# Patient Record
Sex: Male | Born: 1935 | Race: White | Hispanic: No | Marital: Married | Smoking: Former smoker
Health system: Southern US, Community
[De-identification: ages and names within clinical notes are randomized; demographics above are authoritative.]

## PROBLEM LIST (undated history)

## (undated) DIAGNOSIS — J449 Chronic obstructive pulmonary disease, unspecified: Secondary | ICD-10-CM

## (undated) DIAGNOSIS — I340 Nonrheumatic mitral (valve) insufficiency: Secondary | ICD-10-CM

## (undated) DIAGNOSIS — I4819 Other persistent atrial fibrillation: Secondary | ICD-10-CM

## (undated) DIAGNOSIS — M1990 Unspecified osteoarthritis, unspecified site: Secondary | ICD-10-CM

## (undated) DIAGNOSIS — I350 Nonrheumatic aortic (valve) stenosis: Secondary | ICD-10-CM

## (undated) DIAGNOSIS — I509 Heart failure, unspecified: Secondary | ICD-10-CM

## (undated) DIAGNOSIS — I4510 Unspecified right bundle-branch block: Secondary | ICD-10-CM

## (undated) HISTORY — DX: Nonrheumatic aortic (valve) stenosis: I35.0

## (undated) HISTORY — DX: Other persistent atrial fibrillation: I48.19

## (undated) HISTORY — PX: CARDIAC CATHETERIZATION: SHX172

## (undated) HISTORY — DX: Nonrheumatic mitral (valve) insufficiency: I34.0

## (undated) HISTORY — PX: TOTAL KNEE ARTHROPLASTY: SHX125

## (undated) HISTORY — PX: CLAVICLE SURGERY: SHX598

## (undated) HISTORY — PX: APPENDECTOMY: SHX54

## (undated) HISTORY — DX: Unspecified right bundle-branch block: I45.10

---

## 2020-07-15 ENCOUNTER — Inpatient Hospital Stay (HOSPITAL_COMMUNITY)
Admission: EM | Admit: 2020-07-15 | Discharge: 2020-07-19 | DRG: 291 | Disposition: A | Discharge status: Home / Self Care | Payer: Medicare Other | Attending: Internal Medicine | Admitting: Internal Medicine

## 2020-07-15 ENCOUNTER — Emergency Department (HOSPITAL_COMMUNITY): Payer: Medicare Other

## 2020-07-15 ENCOUNTER — Other Ambulatory Visit: Payer: Self-pay

## 2020-07-15 ENCOUNTER — Encounter (HOSPITAL_COMMUNITY): Payer: Self-pay

## 2020-07-15 DIAGNOSIS — I083 Combined rheumatic disorders of mitral, aortic and tricuspid valves: Secondary | ICD-10-CM | POA: Diagnosis present

## 2020-07-15 DIAGNOSIS — N400 Enlarged prostate without lower urinary tract symptoms: Secondary | ICD-10-CM | POA: Diagnosis present

## 2020-07-15 DIAGNOSIS — D649 Anemia, unspecified: Secondary | ICD-10-CM | POA: Diagnosis present

## 2020-07-15 DIAGNOSIS — E876 Hypokalemia: Secondary | ICD-10-CM | POA: Diagnosis present

## 2020-07-15 DIAGNOSIS — T502X5A Adverse effect of carbonic-anhydrase inhibitors, benzothiadiazides and other diuretics, initial encounter: Secondary | ICD-10-CM | POA: Diagnosis present

## 2020-07-15 DIAGNOSIS — J441 Chronic obstructive pulmonary disease with (acute) exacerbation: Secondary | ICD-10-CM | POA: Diagnosis present

## 2020-07-15 DIAGNOSIS — D72829 Elevated white blood cell count, unspecified: Secondary | ICD-10-CM | POA: Diagnosis present

## 2020-07-15 DIAGNOSIS — G8929 Other chronic pain: Secondary | ICD-10-CM | POA: Diagnosis present

## 2020-07-15 DIAGNOSIS — I5033 Acute on chronic diastolic (congestive) heart failure: Secondary | ICD-10-CM | POA: Diagnosis present

## 2020-07-15 DIAGNOSIS — Z87891 Personal history of nicotine dependence: Secondary | ICD-10-CM

## 2020-07-15 DIAGNOSIS — Z66 Do not resuscitate: Secondary | ICD-10-CM | POA: Diagnosis present

## 2020-07-15 DIAGNOSIS — I480 Paroxysmal atrial fibrillation: Secondary | ICD-10-CM | POA: Diagnosis not present

## 2020-07-15 DIAGNOSIS — Z7901 Long term (current) use of anticoagulants: Secondary | ICD-10-CM

## 2020-07-15 DIAGNOSIS — I5032 Chronic diastolic (congestive) heart failure: Secondary | ICD-10-CM | POA: Diagnosis present

## 2020-07-15 DIAGNOSIS — B9789 Other viral agents as the cause of diseases classified elsewhere: Secondary | ICD-10-CM | POA: Diagnosis present

## 2020-07-15 DIAGNOSIS — J9601 Acute respiratory failure with hypoxia: Secondary | ICD-10-CM | POA: Diagnosis present

## 2020-07-15 DIAGNOSIS — Y929 Unspecified place or not applicable: Secondary | ICD-10-CM

## 2020-07-15 DIAGNOSIS — Z20822 Contact with and (suspected) exposure to covid-19: Secondary | ICD-10-CM | POA: Diagnosis present

## 2020-07-15 DIAGNOSIS — I482 Chronic atrial fibrillation, unspecified: Secondary | ICD-10-CM | POA: Diagnosis present

## 2020-07-15 DIAGNOSIS — I4821 Permanent atrial fibrillation: Secondary | ICD-10-CM | POA: Diagnosis present

## 2020-07-15 DIAGNOSIS — I350 Nonrheumatic aortic (valve) stenosis: Secondary | ICD-10-CM | POA: Diagnosis not present

## 2020-07-15 DIAGNOSIS — Z96653 Presence of artificial knee joint, bilateral: Secondary | ICD-10-CM | POA: Diagnosis present

## 2020-07-15 DIAGNOSIS — I110 Hypertensive heart disease with heart failure: Secondary | ICD-10-CM | POA: Diagnosis present

## 2020-07-15 DIAGNOSIS — I272 Pulmonary hypertension, unspecified: Secondary | ICD-10-CM | POA: Diagnosis present

## 2020-07-15 DIAGNOSIS — I509 Heart failure, unspecified: Secondary | ICD-10-CM

## 2020-07-15 DIAGNOSIS — I959 Hypotension, unspecified: Secondary | ICD-10-CM | POA: Diagnosis not present

## 2020-07-15 HISTORY — DX: Heart failure, unspecified: I50.9

## 2020-07-15 HISTORY — DX: Unspecified osteoarthritis, unspecified site: M19.90

## 2020-07-15 HISTORY — DX: Chronic obstructive pulmonary disease, unspecified: J44.9

## 2020-07-15 LAB — URINALYSIS, ROUTINE W REFLEX MICROSCOPIC
Bacteria, UA: NONE SEEN
Bilirubin Urine: NEGATIVE
Glucose, UA: NEGATIVE mg/dL
Ketones, ur: NEGATIVE mg/dL
Leukocytes,Ua: NEGATIVE
Nitrite: NEGATIVE
Protein, ur: NEGATIVE mg/dL
Specific Gravity, Urine: 1.005 (ref 1.005–1.030)
pH: 6 (ref 5.0–8.0)

## 2020-07-15 LAB — RESPIRATORY PANEL BY PCR

## 2020-07-15 LAB — COMPREHENSIVE METABOLIC PANEL
ALT: 15 U/L (ref 0–44)
AST: 20 U/L (ref 15–41)
Albumin: 3.6 g/dL (ref 3.5–5.0)
Alkaline Phosphatase: 66 U/L (ref 38–126)
Anion gap: 14 (ref 5–15)
BUN: 23 mg/dL (ref 8–23)
CO2: 30 mmol/L (ref 22–32)
Calcium: 8.8 mg/dL — ABNORMAL LOW (ref 8.9–10.3)
Chloride: 93 mmol/L — ABNORMAL LOW (ref 98–111)
Creatinine, Ser: 0.98 mg/dL (ref 0.61–1.24)
GFR, Estimated: >60 mL/min (ref >60)
Glucose, Bld: 116 mg/dL — ABNORMAL HIGH (ref 70–99)
Potassium: 3.1 mmol/L — ABNORMAL LOW (ref 3.5–5.1)
Sodium: 137 mmol/L (ref 135–145)
Total Bilirubin: 1.8 mg/dL — ABNORMAL HIGH (ref 0.3–1.2)
Total Protein: 6.9 g/dL (ref 6.5–8.1)

## 2020-07-15 LAB — CBC WITH DIFFERENTIAL/PLATELET
Abs Immature Granulocytes: 0.05 10*3/uL (ref 0.00–0.07)
Basophils Absolute: 0.1 10*3/uL (ref 0.0–0.1)
Basophils Relative: 1 %
Eosinophils Absolute: 0.1 10*3/uL (ref 0.0–0.5)
Eosinophils Relative: 1 %
HCT: 40.3 % (ref 39.0–52.0)
Hemoglobin: 13.1 g/dL (ref 13.0–17.0)
Immature Granulocytes: 1 %
Lymphocytes Relative: 6 %
Lymphs Abs: 0.7 10*3/uL (ref 0.7–4.0)
MCH: 32.3 pg (ref 26.0–34.0)
MCHC: 32.5 g/dL (ref 30.0–36.0)
MCV: 99.3 fL (ref 80.0–100.0)
Monocytes Absolute: 0.8 10*3/uL (ref 0.1–1.0)
Monocytes Relative: 7 %
Neutro Abs: 9.4 10*3/uL — ABNORMAL HIGH (ref 1.7–7.7)
Neutrophils Relative %: 84 %
Platelets: 208 10*3/uL (ref 150–400)
RBC: 4.06 MIL/uL — ABNORMAL LOW (ref 4.22–5.81)
RDW: 15.8 % — ABNORMAL HIGH (ref 11.5–15.5)
WBC: 11 10*3/uL — ABNORMAL HIGH (ref 4.0–10.5)
nRBC: 0 % (ref 0.0–0.2)

## 2020-07-15 LAB — PROTIME-INR
INR: 2.5 — ABNORMAL HIGH (ref 0.8–1.2)
Prothrombin Time: 26.4 seconds — ABNORMAL HIGH (ref 11.4–15.2)

## 2020-07-15 LAB — STREP PNEUMONIAE URINARY ANTIGEN: Strep Pneumo Urinary Antigen: NEGATIVE

## 2020-07-15 LAB — RESP PANEL BY RT-PCR (FLU A&B, COVID) ARPGX2
Influenza A by PCR: NEGATIVE
Influenza B by PCR: NEGATIVE
SARS Coronavirus 2 by RT PCR: NEGATIVE

## 2020-07-15 LAB — TSH: TSH: 0.398 u[IU]/mL (ref 0.350–4.500)

## 2020-07-15 LAB — BRAIN NATRIURETIC PEPTIDE: B Natriuretic Peptide: 487.9 pg/mL — ABNORMAL HIGH (ref 0.0–100.0)

## 2020-07-15 MED ORDER — WARFARIN SODIUM 3 MG PO TABS
6.0000 mg | ORAL_TABLET | Freq: Once | ORAL | Status: AC
  Administered 2020-07-15: 6 mg via ORAL
  Filled 2020-07-15 (×2): qty 2

## 2020-07-15 MED ORDER — SODIUM CHLORIDE 0.9 % IV SOLN
INTRAVENOUS | Status: DC | PRN
  Administered 2020-07-15: 1000 mL via INTRAVENOUS

## 2020-07-15 MED ORDER — BISACODYL 5 MG PO TBEC: 5.0000 mg | DELAYED_RELEASE_TABLET | Freq: Every day | ORAL | Status: DC | PRN

## 2020-07-15 MED ORDER — HYDRALAZINE HCL 20 MG/ML IJ SOLN: 5.0000 mg | INTRAMUSCULAR | Status: DC | PRN

## 2020-07-15 MED ORDER — FUROSEMIDE 10 MG/ML IJ SOLN
40.0000 mg | Freq: Two times a day (BID) | INTRAMUSCULAR | Status: DC
  Administered 2020-07-15: 40 mg via INTRAVENOUS
  Filled 2020-07-15: qty 4

## 2020-07-15 MED ORDER — FUROSEMIDE 10 MG/ML IJ SOLN
40.0000 mg | Freq: Once | INTRAMUSCULAR | Status: AC
  Administered 2020-07-15: 40 mg via INTRAVENOUS
  Filled 2020-07-15: qty 4

## 2020-07-15 MED ORDER — MONTELUKAST SODIUM 10 MG PO TABS
10.0000 mg | ORAL_TABLET | Freq: Every day | ORAL | Status: DC
  Administered 2020-07-16 – 2020-07-19 (×4): 10 mg via ORAL
  Filled 2020-07-15 (×5): qty 1

## 2020-07-15 MED ORDER — HYDROCODONE-ACETAMINOPHEN 7.5-325 MG PO TABS: 1.0000 | ORAL_TABLET | Freq: Three times a day (TID) | ORAL | Status: DC | PRN

## 2020-07-15 MED ORDER — TIZANIDINE HCL 4 MG PO TABS: 2.0000 mg | ORAL_TABLET | Freq: Every evening | ORAL | Status: DC | PRN

## 2020-07-15 MED ORDER — DUTASTERIDE 0.5 MG PO CAPS
0.5000 mg | ORAL_CAPSULE | Freq: Every day | ORAL | Status: DC
  Administered 2020-07-16 – 2020-07-19 (×4): 0.5 mg via ORAL
  Filled 2020-07-15 (×4): qty 1

## 2020-07-15 MED ORDER — TAMSULOSIN HCL 0.4 MG PO CAPS
0.8000 mg | ORAL_CAPSULE | Freq: Every day | ORAL | Status: DC
  Administered 2020-07-15 – 2020-07-18 (×4): 0.8 mg via ORAL
  Filled 2020-07-15 (×4): qty 2

## 2020-07-15 MED ORDER — POLYVINYL ALCOHOL 1.4 % OP SOLN
1.0000 [drp] | Freq: Every day | OPHTHALMIC | Status: DC | PRN
  Filled 2020-07-15: qty 15

## 2020-07-15 MED ORDER — PROPYLENE GLYCOL 0.6 % OP SOLN: 1.0000 [drp] | Freq: Every day | OPHTHALMIC | Status: DC | PRN

## 2020-07-15 MED ORDER — LEVALBUTEROL HCL 0.63 MG/3ML IN NEBU: 0.6300 mg | INHALATION_SOLUTION | Freq: Four times a day (QID) | RESPIRATORY_TRACT | Status: DC | PRN

## 2020-07-15 MED ORDER — SODIUM CHLORIDE 0.9 % IV SOLN
500.0000 mg | INTRAVENOUS | Status: AC
  Administered 2020-07-15: 500 mg via INTRAVENOUS
  Filled 2020-07-15 (×2): qty 500

## 2020-07-15 MED ORDER — METHYLPREDNISOLONE SODIUM SUCC 125 MG IJ SOLR
60.0000 mg | Freq: Two times a day (BID) | INTRAMUSCULAR | Status: AC
  Administered 2020-07-15 – 2020-07-16 (×2): 60 mg via INTRAVENOUS
  Filled 2020-07-15 (×2): qty 2

## 2020-07-15 MED ORDER — POLYETHYLENE GLYCOL 3350 17 G PO PACK: 17.0000 g | PACK | Freq: Every day | ORAL | Status: DC | PRN

## 2020-07-15 MED ORDER — WARFARIN - PHARMACIST DOSING INPATIENT: Freq: Every day | Status: DC

## 2020-07-15 MED ORDER — AZITHROMYCIN 500 MG PO TABS
500.0000 mg | ORAL_TABLET | Freq: Every day | ORAL | Status: AC
  Administered 2020-07-16 – 2020-07-19 (×4): 500 mg via ORAL
  Filled 2020-07-15 (×6): qty 1

## 2020-07-15 MED ORDER — DOCUSATE SODIUM 100 MG PO CAPS
100.0000 mg | ORAL_CAPSULE | Freq: Two times a day (BID) | ORAL | Status: DC
  Administered 2020-07-15 – 2020-07-19 (×8): 100 mg via ORAL
  Filled 2020-07-15 (×8): qty 1

## 2020-07-15 MED ORDER — METOPROLOL SUCCINATE ER 50 MG PO TB24
50.0000 mg | ORAL_TABLET | Freq: Every day | ORAL | Status: DC
  Filled 2020-07-15: qty 1

## 2020-07-15 MED ORDER — ACETAMINOPHEN 650 MG RE SUPP: 650.0000 mg | Freq: Four times a day (QID) | RECTAL | Status: DC | PRN

## 2020-07-15 MED ORDER — ONDANSETRON HCL 4 MG PO TABS: 4.0000 mg | ORAL_TABLET | Freq: Four times a day (QID) | ORAL | Status: DC | PRN

## 2020-07-15 MED ORDER — ALPRAZOLAM 0.25 MG PO TABS: 0.1250 mg | ORAL_TABLET | Freq: Every evening | ORAL | Status: DC | PRN

## 2020-07-15 MED ORDER — IPRATROPIUM BROMIDE 0.02 % IN SOLN
0.5000 mg | Freq: Four times a day (QID) | RESPIRATORY_TRACT | Status: DC
  Administered 2020-07-16: 0.5 mg via RESPIRATORY_TRACT
  Filled 2020-07-15 (×2): qty 2.5

## 2020-07-15 MED ORDER — IRBESARTAN 75 MG PO TABS
75.0000 mg | ORAL_TABLET | Freq: Every day | ORAL | Status: DC
  Filled 2020-07-15 (×2): qty 1

## 2020-07-15 MED ORDER — ONDANSETRON HCL 4 MG/2ML IJ SOLN: 4.0000 mg | Freq: Four times a day (QID) | INTRAMUSCULAR | Status: DC | PRN

## 2020-07-15 MED ORDER — ACETAMINOPHEN 325 MG PO TABS
650.0000 mg | ORAL_TABLET | Freq: Four times a day (QID) | ORAL | Status: DC | PRN
  Administered 2020-07-17: 650 mg via ORAL
  Filled 2020-07-15: qty 2

## 2020-07-15 MED ORDER — MORPHINE SULFATE (PF) 2 MG/ML IV SOLN: 2.0000 mg | INTRAVENOUS | Status: DC | PRN

## 2020-07-15 MED ORDER — PREDNISONE 20 MG PO TABS
40.0000 mg | ORAL_TABLET | Freq: Every day | ORAL | Status: AC
  Administered 2020-07-16 – 2020-07-19 (×4): 40 mg via ORAL
  Filled 2020-07-15 (×5): qty 2

## 2020-07-15 NOTE — ED Provider Notes (Signed)
[facility] EMERGENCY DEPARTMENT Provider Note   CSN: [id number] Arrival date & time: 07/15/20  1231     History Chief Complaint  Patient presents with  . Congested Cough w/ decreased O2 sats    No shob, Hx of COPD and CHF    Randon Somera is a 86 y.o. male.  HPI Patient presents with congestion, cough.  Patient is not dyspneic, has no pain, no fever, no nausea, vomiting.  He notes that has been going on for few days, was more prominent yesterday, but today with persistency of cough, congestion he went to the clinic. There patient was evaluated and sent here due to his history and symptoms. He states that he was otherwise well prior to the onset of this. Per report the patient had hypoxia, mid 80s saturation on room air, this improved to 96% with 2 L via nasal cannula.     Past Medical History:  Diagnosis Date  . Arthritis   . CHF (congestive heart failure) (HCC)   . COPD (chronic obstructive pulmonary disease) (HCC)     There are no problems to display for this patient.   Past Surgical History:  Procedure Laterality Date  . TOTAL KNEE ARTHROPLASTY Bilateral        No family history on file.     Home Medications Prior to Admission medications   Not on File    Allergies    Patient has no known allergies.  Review of Systems   Review of Systems  Constitutional:       Per HPI, otherwise negative  HENT:       Per HPI, otherwise negative  Respiratory:       Per HPI, otherwise negative  Cardiovascular:       Per HPI, otherwise negative  Gastrointestinal: Negative for vomiting.  Endocrine:       Negative aside from HPI  Genitourinary:       Neg aside from HPI   Musculoskeletal:       Per HPI, otherwise negative  Skin: Negative.   Neurological: Negative for syncope and weakness.    Physical Exam Updated Vital Signs BP 121/68 (BP Location: Right Arm)   Pulse 86   Temp 98.1 F (36.7 C) (Oral)   Resp 20   Ht 5\' 7"  (1.702 m)   Wt  84.8 kg   SpO2 96%   BMI 29.29 kg/m   Physical Exam Vitals and nursing note reviewed.  Constitutional:      General: He is not in acute distress.    Appearance: He is well-developed.  HENT:     Head: Normocephalic and atraumatic.  Eyes:     Extraocular Movements: EOM normal.     Conjunctiva/sclera: Conjunctivae normal.  Cardiovascular:     Rate and Rhythm: Normal rate and regular rhythm.  Pulmonary:     Effort: Tachypnea present.     Breath sounds: Decreased air movement present. No stridor.     Comments: Substantial cough. Abdominal:     General: There is no distension.  Musculoskeletal:        General: No edema.  Skin:    General: Skin is warm and dry.  Neurological:     Mental Status: He is alert and oriented to person, place, and time.  Psychiatric:        Mood and Affect: Mood and affect normal.     ED Results / Procedures / Treatments   Labs (all labs ordered are listed, but only abnormal results  are displayed) Labs Reviewed  COMPREHENSIVE METABOLIC PANEL - Abnormal; Notable for the following components:      Result Value   Potassium 3.1 (*)    Chloride 93 (*)    Glucose, Bld 116 (*)    Calcium 8.8 (*)    Total Bilirubin 1.8 (*)    All other components within normal limits  CBC WITH DIFFERENTIAL/PLATELET - Abnormal; Notable for the following components:   WBC 11.0 (*)    RBC 4.06 (*)    RDW 15.8 (*)    Neutro Abs 9.4 (*)    All other components within normal limits  BRAIN NATRIURETIC PEPTIDE - Abnormal; Notable for the following components:   B Natriuretic Peptide 487.9 (*)    All other components within normal limits  URINALYSIS, ROUTINE W REFLEX MICROSCOPIC - Abnormal; Notable for the following components:   Color, Urine STRAW (*)    Hgb urine dipstick MODERATE (*)    All other components within normal limits  RESP PANEL BY RT-PCR (FLU A&B, COVID) [id number]    EKG EKG Interpretation  Date/Time:  Friday July 15 2020 12:53:04 EST Ventricular  Rate:  79 PR Interval:    QRS Duration: 155 QT Interval:  422 QTC Calculation: 472 R Axis:   98 Text Interpretation: Atrial fibrillation Right bundle branch block Artifact Abnormal ECG Confirmed by Gerhard Munch [phone]) on 07/15/2020 1:10:41 PM   Cardiac monitor irregular 70/80, otherwise unremarkable. Pulse oximetry 96% 2 L via nasal cannula abnormal   Radiology DG Chest Port 1 View  Result Date: 07/15/2020 CLINICAL DATA:  SOB EXAM: PORTABLE CHEST 1 VIEW COMPARISON:  None. FINDINGS: No pneumothorax. Blunting of the left costophrenic sulcus. Patchy left basilar opacities. Cardiomegaly. Emphysematous changes. Multilevel spondylosis. IMPRESSION: Patchy left basilar opacities and trace left effusion, infiltrate versus edema. Cardiomegaly and emphysema. Electronically Signed   By: Stana Bunting M.D.   On: 07/15/2020 13:12    Procedures Procedures (including critical care time)  Medications Ordered in ED Medications  furosemide (LASIX) injection 40 mg (has no administration in time range)    ED Course  I have reviewed the triage vital signs and the nursing notes.  Pertinent labs & imaging results that were available during my care of the patient were reviewed by me and considered in my medical decision making (see chart for details).  2:56 PM On reexam the patient is continue to require 2 L via nasal cannula for appropriate saturation.  I have discussed all findings with him and his son. Patient's history of heart failure seems to be dominant factor in today's presentation for cough, dyspnea. Patient has recently moved to this area, does have a primary care physician but has no cardiologist. Patient's x-rays suggest possible pneumonia but he is afebrile, and while admitted can be monitored for this possibility, though its low likelihood on arrival. Given concern for new oxygen requirement, in the context of worsening heart failure, patient will require admission, IV  Lasix.  Covid negative  Final Clinical Impression(s) / ED Diagnoses Final diagnoses:  Acute on chronic congestive heart failure, unspecified heart failure type (HCC)   MDM Number of Diagnoses or Management Options Acute on chronic congestive heart failure, unspecified heart failure type (HCC): established, worsening   Amount and/or Complexity of Data Reviewed Clinical lab tests: reviewed Tests in the radiology section of CPT: reviewed Tests in the medicine section of CPT: reviewed Decide to obtain previous medical records or to obtain history from someone other than the patient: yes Obtain history  from someone other than the patient: yes Discuss the patient with other providers: yes Independent visualization of images, tracings, or specimens: yes  Risk of Complications, Morbidity, and/or Mortality Presenting problems: high Diagnostic procedures: high Management options: high  Critical Care Total time providing critical care: < 30 minutes  Patient Progress Patient progress: stable     Gerhard Munch, MD 07/15/20 1458

## 2020-07-15 NOTE — ED Notes (Signed)
Report given to 3E, NT to draw BC x 2 before transport

## 2020-07-15 NOTE — H&P (Signed)
History and Physical    Ruben [name] IFO:[id number] DOB: 1926/12/20 DOA: 07/15/2020  PCP: Clovis Riley, L.August Saucer, MD Consultants:  None Patient coming from:  Home - lives with wife, son, daughter-in-law, their children - moved to [place] about a month ago; NOK:  [place], [place], [phone]  Chief Complaint: Cough, SOB  HPI: Ruben [name] is a 86 y.o. male with medical history significant of COPD; afib on Coumadin; and unspecified CHF presenting with cough, SOB.  He started with chest congestion a couple of days ago, worse last night.  No sick contacts.  No LE edema.  No orthopnea.  Occasional PND.  No fever.  Cough is occasionally productive of yellowish-white phlegm.  +wheezing.  Has been using his home neb BID as scheduled with prn HFA use.  I spoke with his son.  It started on Monday as a dry cough -> mucus cough.  He was hospitalized once before for this.  He has a "faulty valve" and a h/o afib.  Additional medical history isn't available through Epic at this time but records have been requested.    ED Course:   Father of [employer] employee, he just moved here from [place].  Has established primary care, no other docs.  New O2 requirement, congestion, on 2-3L.  Likely CHF > COPD.    Review of Systems: As per HPI; otherwise review of systems reviewed and negative.   Ambulatory Status:  Ambulates without assistance  COVID Vaccine Status:   Complete plus booster  Past Medical History:  Diagnosis Date  . Arthritis   . CHF (congestive heart failure) (HCC)   . COPD (chronic obstructive pulmonary disease) (HCC)     Past Surgical History:  Procedure Laterality Date  . TOTAL KNEE ARTHROPLASTY Bilateral     Social History   Socioeconomic History  . Marital status: Married    Spouse name: Not on file  . Number of children: Not on file  . Years of education: Not on file  . Highest education level: Not on file  Occupational History  . Occupation: retired  Tobacco Use  . Smoking status: Former Smoker     Packs/day: 1.00    Years: 30.00    Pack years: 30.00    Quit date: 1975    Years since quitting: 46.9  . Smokeless tobacco: Never Used  Substance and Sexual Activity  . Alcohol use: Not Currently  . Drug use: Never  . Sexual activity: Not on file  Other Topics Concern  . Not on file  Social History Narrative  . Not on file   Social Determinants of Health   Financial Resource Strain: Not on file  Food Insecurity: Not on file  Transportation Needs: Not on file  Physical Activity: Not on file  Stress: Not on file  Social Connections: Not on file  Intimate Partner Violence: Not on file    No Known Allergies  History reviewed. No pertinent family history.  Prior to Admission medications   Not on File    Physical Exam: Vitals:   07/15/20 1515 07/15/20 1530 07/15/20 1545 07/15/20 1600  BP: (!) 143/81 125/86 140/87 139/78  Pulse: 87 84 (!) 105 (!) 59  Resp:    20  Temp:      TempSrc:      SpO2: 91% 94% (!) 89% 91%  Weight:      Height:         . General:  Appears calm and comfortable and in NAD, on [place] O2 . Eyes:  PERRL, EOMI, normal lids, iris . ENT:  grossly normal hearing, lips & tongue, mmm . Neck:  no LAD, masses or thyromegaly . Cardiovascular:  Irregularly irregular without tachycardia, no r/g, 2-3/6 systolic murmur. No LE edema.  Marland Kitchen Respiratory:   Bibasilar rales with scattered wheezes.  Normal respiratory effort. . Abdomen:  soft, NT, ND, NABS . Back:   normal alignment, no CVAT . Skin:  no rash or induration seen on limited exam . Musculoskeletal:  grossly normal tone BUE/BLE, good ROM, no bony abnormality . Psychiatric:  grossly normal mood and affect, speech fluent and appropriate, AOx3 . Neurologic:  CN 2-12 grossly intact, moves all extremities in coordinated fashion    Radiological Exams on Admission: Independently reviewed - see discussion in A/P where applicable  DG Chest Port 1 View  Result Date: 07/15/2020 CLINICAL DATA:  SOB EXAM:  PORTABLE CHEST 1 VIEW COMPARISON:  None. FINDINGS: No pneumothorax. Blunting of the left costophrenic sulcus. Patchy left basilar opacities. Cardiomegaly. Emphysematous changes. Multilevel spondylosis. IMPRESSION: Patchy left basilar opacities and trace left effusion, infiltrate versus edema. Cardiomegaly and emphysema. Electronically Signed   By: Stana Bunting M.D.   On: 07/15/2020 13:12    EKG: Independently reviewed.  Afib with rate 79; RBBB with no evidence of acute ischemia   Labs on Admission: I have personally reviewed the available labs and imaging studies at the time of the admission.  Pertinent labs:   K+ 3.1 Glucose 116 Bili 1.8 BNP 487.9 WBC 11.0 COVID/flu negative UA: moderate Hgb   Assessment/Plan Principal Problem:   COPD with acute exacerbation (HCC) Active Problems:   Acute on chronic diastolic CHF (congestive heart failure) (HCC)   Atrial fibrillation, chronic (HCC)   Acute on chronic respiratory failure associated with a COPD exacerbation -Patient's shortness of breath and productive cough are most likely caused by acute COPD exacerbation, although there may also be a component of CHF (see below).  -He has a h/o COPD but is not on home O2, currently on 1-2L -He recently moved to [facility] and does not have specialists, although he recently had his initial PCP appointment -He does not have fever and has mild leukocytosis.  -Chest x-ray is not overly consistent with pneumonia -will admit patient - with his multifactorial presentation, it seems likely that he will need several days of hospitalization to show sufficient improvement for discharge. -Nebulizers: scheduled ipratropium and prn Xopenex -Solu-Medrol 60 mg IV BID -> Prednisone 40 mg PO daily; 5 days of treatment is thought to be sufficient for treatment of acute COPD exacerbations -IV Azithromycin -Continue Singulair -Coordinated care with TOC team/PT/OT/Nutrition/RT consults  Acute on chronic CHF -Also  with possible CHF component -Patient with known h/o chronic (?diastolic) CHF  -CXR consistent with mild pulmonary edema -Mildly elevated BNP without known baseline -With elevated BNP and abnl CXR, acute decompensated CHF seems probable as contributor to diagnosis; however, on exam he does not appear obviously volume overloaded -Will request echocardiogram -ACE/ARB, beta blocker, and spironolactone are recommended as per guideline-directed medical therapy to reduce morbidity/mortality; will start low-dose ARB for now in case transition to Sherryll Burger is desired and also continue BB -CHF/multimorbid order set utilized; may need cardiology/CHF team consult but will hold until Echo results are available -Was given Lasix 40 mg x 1 in ER and will repeat with 40 mg IV BID -Continue [place] O2 for now -Normal kidney function at this time, will follow -Repeat EKG in AM  Atrial fibrillation -Rate controlled with Toprol XL -Coumadin per  pharmacy  HTN -Continue Toprol XL  BPH -Continue Flomax, Avodart  Chronic pain -I have reviewed this patient in the [place] Controlled Substances Reporting System.  He is receiving medications from only one provider and appears to be taking them as prescribed. -I have reviewed this patient in the [place] Controlled Substances Reporting System.  He has nothing listed in the registry. -Continue Norco, Tizanidine   Note: This patient has been tested and is negative for the novel coronavirus COVID-19. He has been fully vaccinated against COVID-19.   DVT prophylaxis: Coumadin Code Status:  DNR - confirmed with patient Family Communication: None present; I spoke with his son by telephone at the time of admission Disposition Plan:  The patient is from: home  Anticipated d/c is to: home, possibly with [facility] services  Anticipated d/c date will depend on clinical response to treatment, likely 2-4 days  Patient is currently: acutely ill Consults called: TOC team;  PT/OT/Nutrition/RT Admission status: Admit - It is my clinical opinion that admission to INPATIENT is reasonable and necessary because this patient will require at least 2 midnights in the hospital to treat this condition based on the medical complexity of the problems presented.  Given the aforementioned information, the predictability of an adverse outcome is felt to be significant.     Jonah Blue MD [facility]   How to contact the [facility] Attending or Consulting provider 7A - 7P or covering provider during after hours 7P -7A, for this patient?  1. Check the care team in [facility] and look for a) attending/consulting [facility] provider listed and b) the [facility] team listed 2. Log into [email or web address] and use [place]'s universal password to access. If you do not have the password, please contact the hospital operator. 3. Locate the [facility] provider you are looking for under [facility] and page to a number that you can be directly reached. 4. If you still have difficulty reaching the provider, please page the [facility] (Director on Call) for the Hospitalists listed on amion for assistance.   07/15/2020, 4:47 PM

## 2020-07-15 NOTE — Progress Notes (Signed)
ANTICOAGULATION CONSULT NOTE - Initial Consult  Pharmacy Consult for warfarin Indication: atrial fibrillation  No Known Allergies  Patient Measurements: Height: 5\' 7"  (170.2 cm) Weight: 84.8 kg (187 lb) IBW/kg (Calculated) : 66.1  Vital Signs: Temp: 98.1 F (36.7 C) (12/10 1241) Temp Source: Oral (12/10 1241) BP: 139/78 (12/10 1600) Pulse Rate: 59 (12/10 1600)  Labs: Recent Labs    07/15/20 1256  HGB 13.1  HCT 40.3  PLT 208  CREATININE 0.98    Estimated Creatinine Clearance: 51.1 mL/min (by C-G formula based on SCr of 0.98 mg/dL).   Medical History: Past Medical History:  Diagnosis Date  . Arthritis   . CHF (congestive heart failure) (HCC)   . COPD (chronic obstructive pulmonary disease) (HCC)     Medications:  (Not in a hospital admission)   Assessment: 67 YOM with a h/o Afib on warfarin presents with productive cough. Pharmacy consulted to resume home warfarin therapy   H/H and Plt wnl. SCr wnl. INR therapeutic at 2.5  Home warfarin regimen: 6 mg daily   Goal of Therapy:  INR 2-3 Monitor platelets by anticoagulation protocol: Yes   Plan:  -Warfarin 6 mg x 1 dose  -Monitor daily PT/INR and s/s of bleeding   82, PharmD., BCPS, BCCCP Clinical Pharmacist Please refer to [facility] for unit-specific pharmacist

## 2020-07-15 NOTE — ED Notes (Signed)
[facility] in [facility] contacted, will return call with Care Everywhere ID to link EMR

## 2020-07-15 NOTE — ED Triage Notes (Signed)
Pt arrived to ED via [facility] from [facility] d/t low O2 sats. Pt initially went to UC for a congested cough that he has had for a few days. Pt has hx of COPD and CHF. It was noted at [facility] that pt O2 sat was 86% on RA. UC placed pt on 2L [place] and called EMS. Pt denies any shob.  EMS VS: BP 144/90, O2 99% on 2L [place], HR 84, T 97.7

## 2020-07-16 ENCOUNTER — Inpatient Hospital Stay (HOSPITAL_COMMUNITY): Payer: Medicare Other

## 2020-07-16 DIAGNOSIS — J9601 Acute respiratory failure with hypoxia: Secondary | ICD-10-CM

## 2020-07-16 DIAGNOSIS — I5033 Acute on chronic diastolic (congestive) heart failure: Secondary | ICD-10-CM

## 2020-07-16 DIAGNOSIS — E876 Hypokalemia: Secondary | ICD-10-CM

## 2020-07-16 DIAGNOSIS — J441 Chronic obstructive pulmonary disease with (acute) exacerbation: Secondary | ICD-10-CM

## 2020-07-16 DIAGNOSIS — I482 Chronic atrial fibrillation, unspecified: Secondary | ICD-10-CM

## 2020-07-16 LAB — BASIC METABOLIC PANEL
Anion gap: 11 (ref 5–15)
BUN: 23 mg/dL (ref 8–23)
CO2: 34 mmol/L — ABNORMAL HIGH (ref 22–32)
Calcium: 8.6 mg/dL — ABNORMAL LOW (ref 8.9–10.3)
Chloride: 95 mmol/L — ABNORMAL LOW (ref 98–111)
Creatinine, Ser: 0.9 mg/dL (ref 0.61–1.24)
GFR, Estimated: >60 mL/min (ref >60)
Glucose, Bld: 144 mg/dL — ABNORMAL HIGH (ref 70–99)
Potassium: 2.6 mmol/L — CL (ref 3.5–5.1)
Sodium: 140 mmol/L (ref 135–145)

## 2020-07-16 LAB — CBC
HCT: 38.5 % — ABNORMAL LOW (ref 39.0–52.0)
Hemoglobin: 12.7 g/dL — ABNORMAL LOW (ref 13.0–17.0)
MCH: 32.1 pg (ref 26.0–34.0)
MCHC: 33 g/dL (ref 30.0–36.0)
MCV: 97.2 fL (ref 80.0–100.0)
Platelets: 198 10*3/uL (ref 150–400)
RBC: 3.96 MIL/uL — ABNORMAL LOW (ref 4.22–5.81)
RDW: 15.4 % (ref 11.5–15.5)
WBC: 4.5 10*3/uL (ref 4.0–10.5)
nRBC: 0 % (ref 0.0–0.2)

## 2020-07-16 LAB — ECHOCARDIOGRAM COMPLETE
AR max vel: 0.87 cm2
AV Area VTI: 0.83 cm2
AV Area mean vel: 0.84 cm2
AV Mean grad: 37 mmHg
AV Peak grad: 63.4 mmHg
Ao pk vel: 3.98 m/s
Area-P 1/2: 1.67 cm2
Height: 67 in
S' Lateral: 3.1 cm
Weight: 2867.74 oz

## 2020-07-16 LAB — PROTIME-INR
INR: 2.4 — ABNORMAL HIGH (ref 0.8–1.2)
Prothrombin Time: 25.3 seconds — ABNORMAL HIGH (ref 11.4–15.2)

## 2020-07-16 LAB — POTASSIUM: Potassium: 3.7 mmol/L (ref 3.5–5.1)

## 2020-07-16 LAB — MAGNESIUM: Magnesium: 1.8 mg/dL (ref 1.7–2.4)

## 2020-07-16 MED ORDER — FUROSEMIDE 10 MG/ML IJ SOLN: 40.0000 mg | Freq: Every day | INTRAMUSCULAR | Status: DC

## 2020-07-16 MED ORDER — POTASSIUM CHLORIDE CRYS ER 20 MEQ PO TBCR
20.0000 meq | EXTENDED_RELEASE_TABLET | Freq: Once | ORAL | Status: AC
  Administered 2020-07-16: 20 meq via ORAL
  Filled 2020-07-16: qty 1

## 2020-07-16 MED ORDER — GUAIFENESIN-DM 100-10 MG/5ML PO SYRP
5.0000 mL | ORAL_SOLUTION | ORAL | Status: DC | PRN
  Administered 2020-07-17 – 2020-07-19 (×10): 5 mL via ORAL
  Filled 2020-07-16 (×10): qty 5

## 2020-07-16 MED ORDER — WARFARIN SODIUM 3 MG PO TABS
6.0000 mg | ORAL_TABLET | Freq: Once | ORAL | Status: AC
  Administered 2020-07-16: 6 mg via ORAL
  Filled 2020-07-16: qty 2

## 2020-07-16 MED ORDER — METOPROLOL SUCCINATE ER 50 MG PO TB24
50.0000 mg | ORAL_TABLET | Freq: Every day | ORAL | Status: DC
  Administered 2020-07-17 – 2020-07-19 (×3): 50 mg via ORAL
  Filled 2020-07-16 (×3): qty 1

## 2020-07-16 MED ORDER — POTASSIUM CHLORIDE CRYS ER 20 MEQ PO TBCR
40.0000 meq | EXTENDED_RELEASE_TABLET | ORAL | Status: AC
  Administered 2020-07-16 (×2): 40 meq via ORAL
  Filled 2020-07-16 (×2): qty 2

## 2020-07-16 MED ORDER — IPRATROPIUM BROMIDE 0.02 % IN SOLN: 0.5000 mg | Freq: Four times a day (QID) | RESPIRATORY_TRACT | Status: DC | PRN

## 2020-07-16 NOTE — Progress Notes (Addendum)
[facility] PROGRESS NOTE   Iran OuchJohn Whisman ZOX:[id number] DOB: 04/17/1927 DOA: 07/15/2020  PCP: Clovis RileyMitchell, L.August Saucerean, MD  Brief History/Interval Summary: 86 y.o. male with medical history significant of COPD; afib on Coumadin; and unspecified CHF presenting with cough, SOB.    Patient was noted to be hypoxic.  He was hospitalized for further management.     Reason for Visit: Acute respiratory failure with hypoxia  Consultants: None  Procedures: None  Antibiotics: Anti-infectives (From admission, onward)   Start     Dose/Rate Route Frequency Ordered Stop   07/16/20 1730  azithromycin (ZITHROMAX) tablet 500 mg       "Followed by" Linked Group Details   500 mg Oral Daily 07/15/20 1639 07/20/20 0959   07/15/20 1730  azithromycin (ZITHROMAX) 500 mg in sodium chloride 0.9 % 250 mL IVPB       "Followed by" Linked Group Details   500 mg 250 mL/hr over 60 Minutes Intravenous Every 24 hours 07/15/20 1639 07/16/20 0845      Subjective/Interval History: Patient mentions that he is feeling better today compared to yesterday.  Continues to have a cough with yellowish expectoration.  Wheezing has improved.  Denies any chest pain.  No nausea vomiting.    Assessment/Plan:  Acute respiratory failure with hypoxia/COPD exacerbation/rhinovirus His symptoms likely triggered by rhinovirus infection.  He does have purulent expectoration.  Continue azithromycin for now.  Change to oral.  Was also noted to be wheezing yesterday and so he is on steroids and nebulizer treatments.  Management for now.  Try and wean him off of oxygen.  Incentive spirometry.  Flutter valve.  Acute on chronic diastolic CHF Patient with history of CHF thought to be diastolic.  No obvious pulmonary edema noted on chest x-ray though reports suggest that there might have been some..  Patient does not have any crackles on exam today suggesting CHF.  Patient was placed on IV diuretics.  Blood pressure noted to be low overnight..   We will hold further IV Lasix for today.  Reevaluate volume status tomorrow.  Follow-up on echocardiogram.  Hypokalemia Potassium noted to be 2.6 today.  Most likely due to diuretics.  Will be aggressively repleted.  We will check another potassium level this afternoon.  Magnesium 1.8.  History of chronic atrial fibrillation Rate is controlled with metoprolol.  He is on warfarin for anticoagulation.  INR therapeutic.  Normocytic anemia No evidence of overt bleeding.  Continue to monitor.  Essential hypertension Monitor blood pressures closely.  History of BPH Continue Flomax and Avodart.  Chronic pain Noted to be on Norco and tizanidine.    DVT Prophylaxis: On warfarin Code Status: DNR Family Communication: Discussed with the patient.  No family at bedside Disposition Plan: Hopefully return home when improved.  Seen by PT and OT.  Status is: Inpatient  Remains inpatient appropriate because:IV treatments appropriate due to intensity of illness or inability to take PO and Inpatient level of care appropriate due to severity of illness   Dispo: The patient is from: Home              Anticipated d/c is to: Home              Anticipated d/c date is: 1 day              Patient currently is not medically stable to d/c.       Medications:  Scheduled: . azithromycin  500 mg Oral Daily  . docusate sodium  100  mg Oral BID  . dutasteride  0.5 mg Oral Daily  . [START ON 07/17/2020] furosemide  40 mg Intravenous Daily  . [START ON 07/17/2020] metoprolol succinate  50 mg Oral Daily  . montelukast  10 mg Oral Daily  . predniSONE  40 mg Oral Q breakfast  . tamsulosin  0.8 mg Oral QHS  . warfarin  6 mg Oral ONCE-1600  . Warfarin - Pharmacist Dosing Inpatient   Does not apply q1600   Continuous: . sodium chloride 1,000 mL (07/15/20 1854)   KWI:OXBDZH chloride, acetaminophen **OR** acetaminophen, ALPRAZolam, bisacodyl, hydrALAZINE, HYDROcodone-acetaminophen, ipratropium,  levalbuterol, morphine injection, ondansetron **OR** ondansetron (ZOFRAN) IV, polyethylene glycol, polyvinyl alcohol, tiZANidine   Objective:  Vital Signs  Vitals:   07/15/20 2312 07/16/20 0348 07/16/20 0727 07/16/20 0735  BP: 104/64 92/61 115/68   Pulse: 60 (!) 58 79   Resp: 18 16 16    Temp: 98.9 F (37.2 C) 98.3 F (36.8 C) (!) 97.4 F (36.3 C)   TempSrc: Oral Oral Oral   SpO2: 91% 92% 96% 96%  Weight:  81.3 kg    Height:        Intake/Output Summary (Last 24 hours) at 07/16/2020 1045 Last data filed at 07/16/2020 0812 Gross per 24 hour  Intake 747.78 ml  Output 1700 ml  Net -952.22 ml   Filed Weights   07/15/20 1244 07/15/20 1825 07/16/20 0348  Weight: 84.8 kg 82.4 kg 81.3 kg    General appearance: Awake alert.  In no distress Resp: Mildly tachypneic at rest.  He had just worked with physical therapy.  No wheezing appreciated currently.  Few crackles bilateral bases. Cardio: S1-S2 is irregularly irregular.  No S3-S4.  Systolic murmur appreciated over the apex GI: Abdomen is soft.  Nontender nondistended.  Bowel sounds are present normal.  No masses organomegaly Extremities: No edema.  Full range of motion of lower extremities. Neurologic: Alert and oriented x3.  No focal neurological deficits.    Lab Results:  Data Reviewed: I have personally reviewed following labs and imaging studies  CBC: Recent Labs  Lab 07/15/20 1256 07/16/20 0259  WBC 11.0* 4.5  NEUTROABS 9.4*  --   HGB 13.1 12.7*  HCT 40.3 38.5*  MCV 99.3 97.2  PLT 208 198    Basic Metabolic Panel: Recent Labs  Lab 07/15/20 1256 07/16/20 0259  NA 137 140  K 3.1* 2.6*  CL 93* 95*  CO2 30 34*  GLUCOSE 116* 144*  BUN 23 23  CREATININE 0.98 0.90  CALCIUM 8.8* 8.6*  MG  --  1.8    GFR: Estimated Creatinine Clearance: 54.6 mL/min (by C-G formula based on SCr of 0.9 mg/dL).  Liver Function Tests: Recent Labs  Lab 07/15/20 1256  AST 20  ALT 15  ALKPHOS 66  BILITOT 1.8*  PROT 6.9   ALBUMIN 3.6     Coagulation Profile: Recent Labs  Lab 07/15/20 1712 07/16/20 0259  INR 2.5* 2.4*    Thyroid Function Tests: Recent Labs    07/15/20 1634  TSH 0.398      Recent Results (from the past 240 hour(s))  Resp Panel by RT-PCR (Flu A&B, Covid) Nasopharyngeal Swab     Status: None   Collection Time: 07/15/20 12:56 PM   Specimen: Nasopharyngeal Swab; Nasopharyngeal(NP) swabs in vial transport medium  Result Value Ref Range Status   SARS Coronavirus 2 by RT PCR NEGATIVE NEGATIVE Final    Comment: (NOTE) SARS-CoV-2 target nucleic acids are NOT DETECTED.  The SARS-CoV-2 RNA is  generally detectable in upper respiratory specimens during the acute phase of infection. The lowest concentration of SARS-CoV-2 viral copies this assay can detect is 138 copies/mL. A negative result does not preclude SARS-Cov-2 infection and should not be used as the sole basis for treatment or other patient management decisions. A negative result may occur with  improper specimen collection/handling, submission of specimen other than nasopharyngeal swab, presence of viral mutation(s) within the areas targeted by this assay, and inadequate number of viral copies(<138 copies/mL). A negative result must be combined with clinical observations, patient history, and epidemiological information. The expected result is Negative.  Fact Sheet for Patients:  BloggerCourse.com  Fact Sheet for Healthcare Providers:  SeriousBroker.it  This test is no t yet approved or cleared by the [place] FDA and  has been authorized for detection and/or diagnosis of SARS-CoV-2 by FDA under an Emergency Use Authorization (EUA). This EUA will remain  in effect (meaning this test can be used) for the duration of the COVID-19 declaration under Section 564(b)(1) of the Act, 21 U.S.C.section 360bbb-3(b)(1), unless the authorization is terminated  or revoked  sooner.       Influenza A by PCR NEGATIVE NEGATIVE Final   Influenza B by PCR NEGATIVE NEGATIVE Final    Comment: (NOTE) The Xpert Xpress SARS-CoV-2/FLU/RSV plus assay is intended as an aid in the diagnosis of influenza from Nasopharyngeal swab specimens and should not be used as a sole basis for treatment. Nasal washings and aspirates are unacceptable for Xpert Xpress SARS-CoV-2/FLU/RSV testing.  Fact Sheet for Patients: BloggerCourse.com  Fact Sheet for Healthcare Providers: SeriousBroker.it  This test is not yet approved or cleared by the [place] FDA and has been authorized for detection and/or diagnosis of SARS-CoV-2 by FDA under an Emergency Use Authorization (EUA). This EUA will remain in effect (meaning this test can be used) for the duration of the COVID-19 declaration under Section 564(b)(1) of the Act, 21 U.S.C. section 360bbb-3(b)(1), unless the authorization is terminated or revoked.  Performed at [facility], [address] [place], [place] [address]   Respiratory Panel by PCR     Status: Abnormal   Collection Time: 07/15/20  4:36 PM   Specimen: Nasopharyngeal Swab; Respiratory  Result Value Ref Range Status   Adenovirus NOT DETECTED NOT DETECTED Final   Coronavirus 229E NOT DETECTED NOT DETECTED Final    Comment: (NOTE) The Coronavirus on the Respiratory Panel, DOES NOT test for the novel  Coronavirus (2019 nCoV)    Coronavirus HKU1 NOT DETECTED NOT DETECTED Final   Coronavirus NL63 NOT DETECTED NOT DETECTED Final   Coronavirus OC43 NOT DETECTED NOT DETECTED Final   Metapneumovirus NOT DETECTED NOT DETECTED Final   Rhinovirus / Enterovirus DETECTED (A) NOT DETECTED Final   Influenza A NOT DETECTED NOT DETECTED Final   Influenza B NOT DETECTED NOT DETECTED Final   Parainfluenza Virus 1 NOT DETECTED NOT DETECTED Final   Parainfluenza Virus 2 NOT DETECTED NOT DETECTED Final   Parainfluenza Virus  3 NOT DETECTED NOT DETECTED Final   Parainfluenza Virus 4 NOT DETECTED NOT DETECTED Final   Respiratory Syncytial Virus NOT DETECTED NOT DETECTED Final   Bordetella pertussis NOT DETECTED NOT DETECTED Final   Bordetella Parapertussis NOT DETECTED NOT DETECTED Final   Chlamydophila pneumoniae NOT DETECTED NOT DETECTED Final   Mycoplasma pneumoniae NOT DETECTED NOT DETECTED Final    Comment: Performed at [facility], [address] [place], [place] [address]  Culture, blood (routine x 2)  Call MD if unable to obtain prior to antibiotics being given     Status: None (Preliminary result)   Collection Time: 07/15/20  5:12 PM   Specimen: BLOOD  Result Value Ref Range Status   Specimen Description BLOOD RIGHT ANTECUBITAL  Final   Special Requests   Final    BOTTLES DRAWN AEROBIC AND ANAEROBIC Blood Culture adequate volume   Culture   Final    NO GROWTH < 24 HOURS Performed at [facility], [address] [place], [place] [address]    Report Status PENDING  Incomplete  Culture, blood (routine x 2) Call MD if unable to obtain prior to antibiotics being given     Status: None (Preliminary result)   Collection Time: 07/15/20  6:41 PM   Specimen: BLOOD LEFT HAND  Result Value Ref Range Status   Specimen Description BLOOD LEFT HAND  Final   Special Requests   Final    BOTTLES DRAWN AEROBIC AND ANAEROBIC Blood Culture adequate volume   Culture   Final    NO GROWTH < 24 HOURS Performed at [facility], [address], [place], [place] [address]    Report Status PENDING  Incomplete      Radiology Studies: DG Chest Port 1 View  Result Date: 07/15/2020 CLINICAL DATA:  SOB EXAM: PORTABLE CHEST 1 VIEW COMPARISON:  None. FINDINGS: No pneumothorax. Blunting of the left costophrenic sulcus. Patchy left basilar opacities. Cardiomegaly. Emphysematous changes. Multilevel spondylosis. IMPRESSION: Patchy left basilar opacities and trace left effusion, infiltrate versus edema.  Cardiomegaly and emphysema. Electronically Signed   By: Stana Bunting M.D.   On: 07/15/2020 13:12       LOS: 1 day   Daouda Lonzo  [facility] Pager on [email or web address]  07/16/2020, 10:45 AM

## 2020-07-16 NOTE — Progress Notes (Signed)
  Echocardiogram 2D Echocardiogram has been attempted. Patient in chair. Will reattempt at later time.  Champ Keetch G Hope Brandenburger 07/16/2020, 12:00 PM

## 2020-07-16 NOTE — Evaluation (Signed)
Physical Therapy Evaluation & Discharge Patient Details Name: Ruben [name] MRN: [id number] DOB: 1927/04/21 Today's Date: 07/16/2020   History of Present Illness  Pt is a 86 year old male with a hx of COPD, a-fib on coumadin, arthritis, and CHF who presented with SOB and chest congestion with a productive cough of yellow-white phlegm that started a couple days prior to 07/15/20. He was admitted with acute on chronic respiratory failure associated with a COPD exacerbation and possible CHF component. CXR revealed patchy L basilar opacities and trace L effusion along with cardiomegaly and emphysema. At baseline, he is not on home O2.  Clinical Impression  Pt presents with condition above. Pt demonstrates appropriate cognition, safety awareness, and balance along with [facility] lower extremity strength, coordination, sensation, and dynamic proprioception. He appears to be at baseline level of function and is only limited in mobility by his present medical condition, but has a wide array of physical assistance and a strong social support at d/c. All education completed with pt understanding. Pt does not need any further PT services at this time, thus signing off.    Follow Up Recommendations No PT follow up    Equipment Recommendations  None recommended by PT    Recommendations for Other Services       Precautions / Restrictions Precautions Precautions: Fall Precaution Comments: monitor O2 sats and BP Restrictions Weight Bearing Restrictions: No      Mobility  Bed Mobility               General bed mobility comments: Pt sitting in recliner upon arrival. Utilizes hospital bed at home, assumed mod I.    Transfers Overall transfer level: Independent Equipment used: None             General transfer comment: Sit to stand without trunk sway or LOB within normal time period.  Ambulation/Gait Ambulation/Gait assistance: Supervision Gait Distance (Feet): 230 Feet Assistive device:  None Gait Pattern/deviations: WFL(Within Functional Limits) Gait velocity: WFL Gait velocity interpretation: >2.62 ft/sec, indicative of community ambulatory General Gait Details: Pt ambulates safely WFL with no LOB or major gait deviations suggesting balance deficits. Displays some decreased step length attributed to guarding to prevent pull at catheter.  Stairs Stairs: Yes Stairs assistance: Supervision Stair Management: No rails;Step to pattern (hand on wall) Number of Stairs: 2 General stair comments: Ascends and descedns using wall for support to simulate home set-up, no overt LOB.  Wheelchair Mobility    Modified Rankin (Stroke Patients Only)       Balance Overall balance assessment: No apparent balance deficits (not formally assessed)                                           Pertinent Vitals/Pain Pain Assessment: No/denies pain    Home Living Family/patient expects to be discharged to:: Private residence Living Arrangements: Spouse/significant other;Children (son and daughter-in-law who both work in health care) Available Help at Discharge: Available 24 hours/day;Family;Personal care attendant Type of Home: House Home Access: Stairs to enter Entrance Stairs-Rails: None Entrance Stairs-Number of Steps: 2 Home Layout: Two level;Able to live on main level with bedroom/bathroom Home Equipment: Bedside commode;Grab bars - tub/shower;Hand held shower head;Shower seat;Walker - 2 wheels;Cane - single point;Hospital bed Additional Comments: Pt helps wife due to her recent stroke but is having aids set-up to come to home to assist her now. Pt just moved from  [place].    Prior Function Level of Independence: Independent         Comments: Pt drives.     Hand Dominance   Dominant Hand: Right    Extremity/Trunk Assessment   Upper Extremity Assessment Upper Extremity Assessment: Defer to OT evaluation    Lower Extremity Assessment Lower  Extremity Assessment: Overall WFL for tasks assessed    Cervical / Trunk Assessment Cervical / Trunk Assessment: Normal  Communication   Communication: HOH  Cognition Arousal/Alertness: Awake/alert Behavior During Therapy: WFL for tasks assessed/performed Overall Cognitive Status: Within Functional Limits for tasks assessed                                 General Comments: A&Ox4. Pt very pleasant.      General Comments General comments (skin integrity, edema, etc.): BP sitting start of session 101/62, BP sitting end of session 121/71, no signs/symptoms noted; SpO2 >/= 91% when on 3L/min O2 via [place] with inaccurate waves/reading in 80s when at 1-2L/min thus remained on 3L/min at end of session    Exercises     Assessment/Plan    PT Assessment Patent does not need any further PT services  PT Problem List         PT Treatment Interventions      PT Goals (Current goals can be found in the Care Plan section)  Acute Rehab PT Goals Patient Stated Goal: to get better PT Goal Formulation: With patient Time For Goal Achievement: 07/18/20 Potential to Achieve Goals: Good    Frequency     Barriers to discharge        Co-evaluation               AM-PAC PT "6 Clicks" Mobility  Outcome Measure Help needed turning from your back to your side while in a flat bed without using bedrails?: None Help needed moving from lying on your back to sitting on the side of a flat bed without using bedrails?: None Help needed moving to and from a bed to a chair (including a wheelchair)?: None Help needed standing up from a chair using your arms (e.g., wheelchair or bedside chair)?: None Help needed to walk in hospital room?: None Help needed climbing 3-5 steps with a railing? : None 6 Click Score: 24    End of Session Equipment Utilized During Treatment: Gait belt;Oxygen (1-3L/min via [place]) Activity Tolerance: Patient tolerated treatment well Patient left: in chair;with call  bell/phone within reach Nurse Communication: Mobility status PT Visit Diagnosis: Other abnormalities of gait and mobility (R26.89);Other (comment) (cardiovascular)    Time: 8889-1694 PT Time Calculation (min) (ACUTE ONLY): 30 min   Charges:   PT Evaluation $PT Eval Low Complexity: 1 Low PT Treatments $Gait Training: 8-22 mins        Raymond Gurney, PT, DPT Acute Rehabilitation Services  Pager: [phone] Office: [phone]   Jewel Baize 07/16/2020, 9:20 AM

## 2020-07-16 NOTE — Progress Notes (Signed)
  Echocardiogram 2D Echocardiogram has been performed.  Roseanna Koplin G Tawan Degroote 07/16/2020, 2:51 PM

## 2020-07-16 NOTE — Evaluation (Signed)
Occupational Therapy Evaluation Patient Details Name: Ruben [name] MRN: [id number] DOB: [birth year] Today's Date: 07/16/2020    History of Present Illness Pt is a 86 year old male with a hx of COPD, a-fib on coumadin, arthritis, and CHF who presented with SOB and chest congestion with a productive cough of yellow-white phlegm that started a couple days prior to 07/15/20. He was admitted with acute on chronic respiratory failure associated with a COPD exacerbation and possible CHF component. CXR revealed patchy L basilar opacities and trace L effusion along with cardiomegaly and emphysema. At baseline, he is not on home O2.   Clinical Impression   Patient admitted with the above diagnosis.  Currently, primary deficit is a cough and increased O2 need.  PTA he was independent with all self care and mobility without O2.  Currently he is close to, if not at his baseline for self care and mobility.  No further OT needs in the acute setting.  Don't anticipate any post acute needs.  Encouraged patient to walk the halls with staff.      Follow Up Recommendations  No OT follow up    Equipment Recommendations  None recommended by OT    Recommendations for Other Services       Precautions / Restrictions Precautions Precautions: None Precaution Comments: monitor O2 sats and BP Restrictions Weight Bearing Restrictions: No      Mobility Bed Mobility Overal bed mobility: Modified Independent             General bed mobility comments: Pt sitting in recliner upon arrival. Utilizes hospital bed at home, assumed mod I.    Transfers Overall transfer level: Independent Equipment used: Rolling walker (2 wheeled)             General transfer comment: RW to bathroom, no AD from BR back to recliner    Balance Overall balance assessment: No apparent balance deficits (not formally assessed)                                         ADL either performed or assessed with  clinical judgement   ADL Overall ADL's : At baseline                                       General ADL Comments: Patient able to sit EOB and complete LB dress, toilet transfer and hygiene without assist and complete stand grooming task with minimum setup.     Vision Patient Visual Report: No change from baseline       Perception     Praxis      Pertinent Vitals/Pain Pain Assessment: No/denies pain     Hand Dominance Right   Extremity/Trunk Assessment Upper Extremity Assessment Upper Extremity Assessment: Overall WFL for tasks assessed   Lower Extremity Assessment Lower Extremity Assessment: Overall WFL for tasks assessed   Cervical / Trunk Assessment Cervical / Trunk Assessment: Normal   Communication Communication Communication: HOH   Cognition Arousal/Alertness: Awake/alert Behavior During Therapy: WFL for tasks assessed/performed Overall Cognitive Status: Within Functional Limits for tasks assessed                                 General Comments: A&Ox4. Pt very pleasant.   General  Comments  3L O2.  O2 removed for 5 min and patient de-sat to 85%.  With 3 L he returned to >91%.      Exercises     Shoulder Instructions      Home Living Family/patient expects to be discharged to:: Private residence Living Arrangements: Spouse/significant other;Children Available Help at Discharge: Available 24 hours/day;Family;Personal care attendant Type of Home: House Home Access: Stairs to enter [employer] of Steps: 2 Entrance Stairs-Rails: None Home Layout: Two level;Able to live on main level with bedroom/bathroom Alternate Level Stairs-Number of Steps: 7 Alternate Level Stairs-Rails: Can reach both Bathroom Shower/Tub: Producer, television/film/video: Standard Bathroom Accessibility: Yes How Accessible: Accessible via walker Home Equipment: Bedside commode;Grab bars - tub/shower;Hand held shower head;Shower seat;Walker  - 2 wheels;Cane - single point;Hospital bed   Additional Comments: Living at son's home.  Assists spouse after recent CVA      Prior Functioning/Environment Level of Independence: Independent        Comments: Pt drives, cares for his own meds and needs no assist with ADL, IADL.        OT Problem List: Decreased activity tolerance      OT Treatment/Interventions:      OT Goals(Current goals can be found in the care plan section) Acute Rehab OT Goals Patient Stated Goal: I want to get back home to my wife. OT Goal Formulation: With patient Time For Goal Achievement: 07/16/20 Potential to Achieve Goals: Good  OT Frequency:     Barriers to D/C:            Co-evaluation              AM-PAC OT "6 Clicks" Daily Activity     Outcome Measure Help from another person eating meals?: None Help from another person taking care of personal grooming?: None Help from another person toileting, which includes using toliet, bedpan, or urinal?: None Help from another person bathing (including washing, rinsing, drying)?: None Help from another person to put on and taking off regular upper body clothing?: None Help from another person to put on and taking off regular lower body clothing?: None 6 Click Score: 24   End of Session Equipment Utilized During Treatment: Rolling walker Nurse Communication: Mobility status  Activity Tolerance: Patient tolerated treatment well Patient left: in chair;with call bell/phone within reach  OT Visit Diagnosis: Unsteadiness on feet (R26.81)                Time: 2536-6440 OT Time Calculation (min): 22 min Charges:  OT General Charges $OT Visit: 1 Visit OT Evaluation $OT Eval Moderate Complexity: 1 Mod  07/16/2020  Rich, OTR/L  Acute Rehabilitation Services  Office:  [phone]   Suzanna Obey 07/16/2020, 9:32 AM

## 2020-07-16 NOTE — Progress Notes (Addendum)
CRITICAL VALUE STICKER  CRITICAL VALUE: Potassium 2.6  RECEIVER (on-site recipient of call): Molli Hazard  DATE & TIME NOTIFIED: 12/11 0505  MESSENGER (representative from lab):  MD NOTIFIED: Rathore MD  TIME OF NOTIFICATION: 0512  RESPONSE: see new orders

## 2020-07-16 NOTE — Progress Notes (Signed)
Patient alert and oriented, resps even and unlabored, lungs clear in all lobes, good air movement noted. Patient continues to have intermittent cough, however, and is producing clear sputum with thick yellow bits in it. He is on 2L  and satting above 94%, even when ambulating with therapy in the hallways. Patient utilizing condom cath for convenience due to urgency, but is otherwise continent of bowel and bladder. Bowel sounds active in all quads and patient had BM this morning, without difficulty. He continues to be afebrile and has no immediate needs at this time. Call light is in reach, will continue to monitor. Continue droplet precautions for rhinovirus/cold symptoms.

## 2020-07-16 NOTE — Progress Notes (Signed)
ANTICOAGULATION CONSULT NOTE  Pharmacy Consult for warfarin Indication: atrial fibrillation  No Known Allergies  Patient Measurements: Height: 5\' 7"  (170.2 cm) Weight: 81.3 kg (179 lb 3.7 oz) IBW/kg (Calculated) : 66.1  Vital Signs: Temp: 97.4 F (36.3 C) (12/11 0727) Temp Source: Oral (12/11 0727) BP: 115/68 (12/11 0727) Pulse Rate: 79 (12/11 0727)  Labs: Recent Labs    07/15/20 1256 07/15/20 1712 07/16/20 0259  HGB 13.1  --  12.7*  HCT 40.3  --  38.5*  PLT 208  --  198  LABPROT  --  26.4* 25.3*  INR  --  2.5* 2.4*  CREATININE 0.98  --  0.90    Estimated Creatinine Clearance: 54.6 mL/min (by C-G formula based on SCr of 0.9 mg/dL).   Medical History: Past Medical History:  Diagnosis Date  . Arthritis   . CHF (congestive heart failure) (HCC)   . COPD (chronic obstructive pulmonary disease) (HCC)     Medications:  Medications Prior to Admission  Medication Sig Dispense Refill Last Dose  . acetaminophen (TYLENOL) 500 MG tablet Take 1,000 mg by mouth every 6 (six) hours as needed for headache (pain).   Past Week at Unknown time  . ALPRAZolam (XANAX) 0.25 MG tablet Take 0.125 mg by mouth at bedtime as needed for anxiety or sleep.   unknown  . dutasteride (AVODART) 0.5 MG capsule Take 0.5 mg by mouth daily.   07/15/2020 at am  . furosemide (LASIX) 40 MG tablet Take 40 mg by mouth daily.   07/15/2020 at am  . Homeopathic Products [facility] ALLERGY RELIEF NA) Place 1 spray into both nostrils 2 (two) times daily as needed (congestion).   Past Week at Unknown time  . HYDROcodone-acetaminophen (NORCO) 7.5-325 MG tablet Take 1 tablet by mouth 3 (three) times daily as needed (pain).   07/13/2020  . levalbuterol (XOPENEX HFA) 45 MCG/ACT inhaler Inhale 2 puffs into the lungs every 4 (four) hours as needed for wheezing or shortness of breath.   07/14/2020 at Unknown time  . levalbuterol (XOPENEX) 0.63 MG/3ML nebulizer solution Take 0.63 mg by nebulization 2 (two) times daily.    07/14/2020 at pm  . metolazone (ZAROXOLYN) 2.5 MG tablet Take 2.5 mg by mouth See admin instructions. Take one tablet (2.5 mg) by mouth twice weekly - Mondays and Thursdays   07/14/2020 at am  . metoprolol succinate (TOPROL-XL) 50 MG 24 hr tablet Take 50 mg by mouth daily.   07/15/2020 at 8:30am  . montelukast (SINGULAIR) 10 MG tablet Take 10 mg by mouth daily.   07/15/2020 at am  . potassium chloride (KLOR-CON) 8 MEQ tablet Take 8 mEq by mouth daily.   07/15/2020 at am  . Propylene Glycol (SYSTANE COMPLETE) 0.6 % SOLN Place 1 drop into both eyes daily as needed (dry eyes).   Past Month at Unknown time  . tamsulosin (FLOMAX) 0.4 MG CAPS capsule Take 0.8 mg by mouth at bedtime.   07/14/2020 at pm  . tizanidine (ZANAFLEX) 2 MG capsule Take 2 mg by mouth at bedtime as needed for muscle spasms.   unknown  . warfarin (COUMADIN) 6 MG tablet Take 6 mg by mouth at bedtime.   07/14/2020 at 2100    Assessment: 61 YOM with a h/o Afib on warfarin presents with productive cough. Pharmacy consulted to resume home warfarin therapy   H/H and Plt wnl. SCr wnl. INR continues to be therapeutic at 2.4 on home warfarin regimen of 6 mg daily.  Goal of Therapy:  INR  2-3 Monitor platelets by anticoagulation protocol: Yes   Plan:  -Warfarin 6 mg x 1 dose  -Monitor daily PT/INR and s/s of bleeding   Kinnie Feil, PharmD PGY1 Acute Care Pharmacy Resident Phone: [phone] 07/16/2020 8:24 AM  Please check [email or web address] for unit specific pharmacy phone numbers.

## 2020-07-17 DIAGNOSIS — I509 Heart failure, unspecified: Secondary | ICD-10-CM

## 2020-07-17 DIAGNOSIS — I350 Nonrheumatic aortic (valve) stenosis: Secondary | ICD-10-CM

## 2020-07-17 LAB — CBC
HCT: 36.8 % — ABNORMAL LOW (ref 39.0–52.0)
Hemoglobin: 12.1 g/dL — ABNORMAL LOW (ref 13.0–17.0)
MCH: 32.3 pg (ref 26.0–34.0)
MCHC: 32.9 g/dL (ref 30.0–36.0)
MCV: 98.1 fL (ref 80.0–100.0)
Platelets: 214 10*3/uL (ref 150–400)
RBC: 3.75 MIL/uL — ABNORMAL LOW (ref 4.22–5.81)
RDW: 15.6 % — ABNORMAL HIGH (ref 11.5–15.5)
WBC: 9.3 10*3/uL (ref 4.0–10.5)
nRBC: 0 % (ref 0.0–0.2)

## 2020-07-17 LAB — BASIC METABOLIC PANEL
Anion gap: 10 (ref 5–15)
BUN: 36 mg/dL — ABNORMAL HIGH (ref 8–23)
CO2: 31 mmol/L (ref 22–32)
Calcium: 8.5 mg/dL — ABNORMAL LOW (ref 8.9–10.3)
Chloride: 97 mmol/L — ABNORMAL LOW (ref 98–111)
Creatinine, Ser: 0.96 mg/dL (ref 0.61–1.24)
GFR, Estimated: >60 mL/min (ref >60)
Glucose, Bld: 145 mg/dL — ABNORMAL HIGH (ref 70–99)
Potassium: 3.4 mmol/L — ABNORMAL LOW (ref 3.5–5.1)
Sodium: 138 mmol/L (ref 135–145)

## 2020-07-17 LAB — PROTIME-INR
INR: 3.1 — ABNORMAL HIGH (ref 0.8–1.2)
Prothrombin Time: 31 seconds — ABNORMAL HIGH (ref 11.4–15.2)

## 2020-07-17 LAB — MAGNESIUM: Magnesium: 2.1 mg/dL (ref 1.7–2.4)

## 2020-07-17 MED ORDER — POTASSIUM CHLORIDE CRYS ER 20 MEQ PO TBCR
40.0000 meq | EXTENDED_RELEASE_TABLET | Freq: Once | ORAL | Status: AC
  Administered 2020-07-17: 40 meq via ORAL
  Filled 2020-07-17: qty 2

## 2020-07-17 MED ORDER — LEVALBUTEROL HCL 0.63 MG/3ML IN NEBU: 0.6300 mg | INHALATION_SOLUTION | Freq: Four times a day (QID) | RESPIRATORY_TRACT | Status: DC | PRN

## 2020-07-17 MED ORDER — WARFARIN SODIUM 5 MG PO TABS
5.0000 mg | ORAL_TABLET | Freq: Once | ORAL | Status: AC
  Administered 2020-07-17: 5 mg via ORAL
  Filled 2020-07-17: qty 1

## 2020-07-17 MED ORDER — POTASSIUM CHLORIDE CRYS ER 20 MEQ PO TBCR
40.0000 meq | EXTENDED_RELEASE_TABLET | Freq: Two times a day (BID) | ORAL | Status: DC
  Administered 2020-07-17 – 2020-07-19 (×5): 40 meq via ORAL
  Filled 2020-07-17 (×5): qty 2

## 2020-07-17 MED ORDER — FUROSEMIDE 10 MG/ML IJ SOLN
40.0000 mg | Freq: Two times a day (BID) | INTRAMUSCULAR | Status: DC
  Administered 2020-07-17 – 2020-07-18 (×4): 40 mg via INTRAVENOUS
  Filled 2020-07-17 (×4): qty 4

## 2020-07-17 NOTE — Progress Notes (Signed)
Patient alert and oriented x 4, sitting on edge of bed since start of shift this morning. He is friendly and cooperative, eager to self manage conditions. He has good PO intake, lungs clear to auscultation in all lobes. Cough still present but is no longer yellow in color, mostly clear and much thinner today than yesterday. He has been utilizing guiafenisin PRN with much efficacy. Bowel sounds positive in all quads, last BM yesterday evening 07/16/2020. Patient has condom cath in place for convenience but is able to get to bedside commode as needed with only standby assist for reassurance, as he tries to navigate oxygen tubing and cath tubing. Patient walks with steady gait and is eager to move during the day. He denies pain or anxiety at this time. PIV in left [facility] with good blood return and flushes extremely well; there are is no erythema, induration, warmth or discomfort in the area. We are still replacing his potassium as it remains slightly low; patient is tolerating this and oral antibiotics well. Cardiologist spoke with him today about the possibility of a TAVR for severe AS, found on echo this hospitalization, likely to follow up on OP basis. Call light in reach, will monitor.

## 2020-07-17 NOTE — Progress Notes (Signed)
[facility] PROGRESS NOTE   Ruben [name] OZD:[id number] DOB: 04-07-1927 DOA: 07/15/2020  PCP: Alroy Dust, L.Marlou Sa, MD  Brief History/Interval Summary: 86 y.o. male with medical history significant of COPD; afib on Coumadin; and unspecified CHF presenting with cough, SOB.    Patient was noted to be hypoxic.  He was hospitalized for further management.     Reason for Visit: Acute respiratory failure with hypoxia  Consultants: [facility]  Procedures: None  Antibiotics: Anti-infectives (From admission, onward)   Start     Dose/Rate Route Frequency Ordered Stop   07/16/20 1730  azithromycin (ZITHROMAX) tablet 500 mg       "Followed by" Linked Group Details   500 mg Oral Daily 07/15/20 1639 07/20/20 0959   07/15/20 1730  azithromycin (ZITHROMAX) 500 mg in sodium chloride 0.9 % 250 mL IVPB       "Followed by" Linked Group Details   500 mg 250 mL/hr over 60 Minutes Intravenous Every 24 hours 07/15/20 1639 07/16/20 0845      Subjective/Interval History: Patient mentions that he is feeling better.  His cough is improving.  His expectoration is not as purulent.  Denies any chest pain.  Shortness of breath is improving as well.  No nausea vomiting.     Assessment/Plan:  Acute respiratory failure with hypoxia/COPD exacerbation/rhinovirus His symptoms likely triggered by rhinovirus infection.  Patient was also noted to be wheezing at the time of admission.  He was given nebulizer treatment and steroids.  Started on azithromycin for purulent expectoration.  Seems to be slowly getting better.  Still on oxygen.  Try to wean him off if possible.  Incentive spirometry and flutter valve.    Acute on chronic diastolic CHF Patient with history of CHF thought to be diastolic.  No obvious pulmonary edema noted on chest x-ray though reports suggest that there might have been some.Marland Kitchen BNP.  He was placed on IV diuretics.  However blood pressures dropped so this was discontinued.  Seen by  cardiology this morning.  Thought to be a bit volume overloaded.  To be started on furosemide.  Echocardiogram shows normal systolic function but he does have severe aortic stenosis.  See below  Severe aortic stenosis Noted on echocardiogram.  Patient mentions that he has been told by his cardiologist in [place] that he had aortic stenosis but he was not sure about the severity.  Discussed also with patient's son who works in the lab at [employer].  We consulted cardiology to assist with management since patient does have good functional quality of life.    Hypokalemia Potassium was aggressively repleted yesterday with improvement.  Additional doses to be given today.  Magnesium 2.1.    History of chronic atrial fibrillation Rate is controlled with metoprolol.  He is on warfarin for anticoagulation.  Pharmacy managing warfarin dose.  Normocytic anemia No evidence of overt bleeding.  Continue to monitor.  Essential hypertension Monitor blood pressures closely.  Noted to be borderline low.  He is on metoprolol currently which is primarily for the A. fib.  History of BPH Continue Flomax and Avodart.  Chronic pain Noted to be on Norco and tizanidine.    DVT Prophylaxis: On warfarin Code Status: DNR Family Communication: Discussed with patient and his son over the phone. Disposition Plan: Hopefully return home when improved.  Seen by PT and OT.  Status is: Inpatient  Remains inpatient appropriate because:IV treatments appropriate due to intensity of illness or inability to take PO and Inpatient level  of care appropriate due to severity of illness   Dispo: The patient is from: Home              Anticipated d/c is to: Home              Anticipated d/c date is: 1 day              Patient currently is not medically stable to d/c.       Medications:  Scheduled: . azithromycin  500 mg Oral Daily  . docusate sodium  100 mg Oral BID  . dutasteride  0.5 mg Oral Daily  .  furosemide  40 mg Intravenous BID  . metoprolol succinate  50 mg Oral Daily  . montelukast  10 mg Oral Daily  . potassium chloride  40 mEq Oral BID  . predniSONE  40 mg Oral Q breakfast  . tamsulosin  0.8 mg Oral QHS  . Warfarin - Pharmacist Dosing Inpatient   Does not apply q1600   Continuous: . sodium chloride 1,000 mL (07/15/20 1854)   EGB:TDVVOH chloride, acetaminophen **OR** acetaminophen, ALPRAZolam, bisacodyl, guaiFENesin-dextromethorphan, hydrALAZINE, HYDROcodone-acetaminophen, ipratropium, levalbuterol, morphine injection, ondansetron **OR** ondansetron (ZOFRAN) IV, polyethylene glycol, polyvinyl alcohol, tiZANidine   Objective:  Vital Signs  Vitals:   07/16/20 1108 07/16/20 1616 07/16/20 1943 07/17/20 0517  BP: (!) 94/56 (!) 106/59 108/68 109/80  Pulse: 69 77 71 69  Resp: _0 Temp: 98.2 F (36.8 C) 98.2 F (36.8 C) 98.5 F (36.9 C) 97.9 F (36.6 C)  TempSrc: Oral Oral Oral Oral  SpO2: 96% 97% 97% 96%  Weight:    81.9 kg  Height:        Intake/Output Summary (Last 24 hours) at 07/17/2020 1103 Last data filed at 07/17/2020 0522 Gross per 24 hour  Intake 840 ml  Output 1175 ml  Net -335 ml   Filed Weights   07/15/20 1825 07/16/20 0348 07/17/20 0517  Weight: 82.4 kg 81.3 kg 81.9 kg    General appearance: Awake alert.  In no distress Resp: Coarse breath sound bilaterally.  Mildly tachypneic.  Few crackles at the bases.  No wheezing heard today. Cardio: S1-S2 is normal regular.  Systolic murmur appreciated over the precordium.   GI: Abdomen is soft.  Nontender nondistended.  Bowel sounds are present normal.  No masses organomegaly Extremities: No edema.  Full range of motion of lower extremities. Neurologic: Alert and oriented x3.  No focal neurological deficits.      Lab Results:  Data Reviewed: I have personally reviewed following labs and imaging studies  CBC: Recent Labs  Lab 07/15/20 1256 07/16/20 0259 07/17/20 0442  WBC 11.0* 4.5  9.3  NEUTROABS 9.4*  --   --   HGB 13.1 12.7* 12.1*  HCT 40.3 38.5* 36.8*  MCV 99.3 97.2 98.1  PLT 208 198 607    Basic Metabolic Panel: Recent Labs  Lab 07/15/20 1256 07/16/20 0259 07/16/20 1316 07/17/20 0442  NA 137 140  --  138  K 3.1* 2.6* 3.7 3.4*  CL 93* 95*  --  97*  CO2 30 34*  --  31  GLUCOSE 116* 144*  --  145*  BUN 23 23  --  36*  CREATININE 0.98 0.90  --  0.96  CALCIUM 8.8* 8.6*  --  8.5*  MG  --  1.8  --  2.1    GFR: Estimated Creatinine Clearance: 51.3 mL/min (by C-G formula based on SCr of 0.96 mg/dL).  Liver  Function Tests: Recent Labs  Lab 07/15/20 1256  AST 20  ALT 15  ALKPHOS 66  BILITOT 1.8*  PROT 6.9  ALBUMIN 3.6     Coagulation Profile: Recent Labs  Lab 07/15/20 1712 07/16/20 0259 07/17/20 0442  INR 2.5* 2.4* 3.1*    Thyroid Function Tests: Recent Labs    07/15/20 1634  TSH 0.398      Recent Results (from the past 240 hour(s))  Resp Panel by RT-PCR (Flu A&B, Covid) Nasopharyngeal Swab     Status: None   Collection Time: 07/15/20 12:56 PM   Specimen: Nasopharyngeal Swab; Nasopharyngeal(NP) swabs in vial transport medium  Result Value Ref Range Status   SARS Coronavirus 2 by RT PCR NEGATIVE NEGATIVE Final    Comment: (NOTE) SARS-CoV-2 target nucleic acids are NOT DETECTED.  The SARS-CoV-2 RNA is generally detectable in upper respiratory specimens during the acute phase of infection. The lowest concentration of SARS-CoV-2 viral copies this assay can detect is 138 copies/mL. A negative result does not preclude SARS-Cov-2 infection and should not be used as the sole basis for treatment or other patient management decisions. A negative result may occur with  improper specimen collection/handling, submission of specimen other than nasopharyngeal swab, presence of viral mutation(s) within the areas targeted by this assay, and inadequate number of viral copies(<138 copies/mL). A negative result must be combined with clinical  observations, patient history, and epidemiological information. The expected result is Negative.  Fact Sheet for Patients:  EntrepreneurPulse.com.au  Fact Sheet for Healthcare Providers:  IncredibleEmployment.be  This test is no t yet approved or cleared by the [place] FDA and  has been authorized for detection and/or diagnosis of SARS-CoV-2 by FDA under an Emergency Use Authorization (EUA). This EUA will remain  in effect (meaning this test can be used) for the duration of the COVID-19 declaration under Section 564(b)(1) of the Act, 21 U.S.C.section 360bbb-3(b)(1), unless the authorization is terminated  or revoked sooner.       Influenza A by PCR NEGATIVE NEGATIVE Final   Influenza B by PCR NEGATIVE NEGATIVE Final    Comment: (NOTE) The Xpert Xpress SARS-CoV-2/FLU/RSV plus assay is intended as an aid in the diagnosis of influenza from Nasopharyngeal swab specimens and should not be used as a sole basis for treatment. Nasal washings and aspirates are unacceptable for Xpert Xpress SARS-CoV-2/FLU/RSV testing.  Fact Sheet for Patients: EntrepreneurPulse.com.au  Fact Sheet for Healthcare Providers: IncredibleEmployment.be  This test is not yet approved or cleared by the [place] FDA and has been authorized for detection and/or diagnosis of SARS-CoV-2 by FDA under an Emergency Use Authorization (EUA). This EUA will remain in effect (meaning this test can be used) for the duration of the COVID-19 declaration under Section 564(b)(1) of the Act, 21 U.S.C. section 360bbb-3(b)(1), unless the authorization is terminated or revoked.  Performed at [facility], [place] [address], [place], [place] [address]   Respiratory Panel by PCR     Status: Abnormal   Collection Time: 07/15/20  4:36 PM   Specimen: Nasopharyngeal Swab; Respiratory  Result Value Ref Range Status   Adenovirus NOT DETECTED NOT  DETECTED Final   Coronavirus 229E NOT DETECTED NOT DETECTED Final    Comment: (NOTE) The Coronavirus on the Respiratory Panel, DOES NOT test for the novel  Coronavirus (2019 nCoV)    Coronavirus HKU1 NOT DETECTED NOT DETECTED Final   Coronavirus NL63 NOT DETECTED NOT DETECTED Final   Coronavirus OC43 NOT DETECTED NOT DETECTED Final  Metapneumovirus NOT DETECTED NOT DETECTED Final   Rhinovirus / Enterovirus DETECTED (A) NOT DETECTED Final   Influenza A NOT DETECTED NOT DETECTED Final   Influenza B NOT DETECTED NOT DETECTED Final   Parainfluenza Virus 1 NOT DETECTED NOT DETECTED Final   Parainfluenza Virus 2 NOT DETECTED NOT DETECTED Final   Parainfluenza Virus 3 NOT DETECTED NOT DETECTED Final   Parainfluenza Virus 4 NOT DETECTED NOT DETECTED Final   Respiratory Syncytial Virus NOT DETECTED NOT DETECTED Final   Bordetella pertussis NOT DETECTED NOT DETECTED Final   Bordetella Parapertussis NOT DETECTED NOT DETECTED Final   Chlamydophila pneumoniae NOT DETECTED NOT DETECTED Final   Mycoplasma pneumoniae NOT DETECTED NOT DETECTED Final    Comment: Performed at [facility], [place] [address], [place], [place] [address]  Culture, blood (routine x 2) Call MD if unable to obtain prior to antibiotics being given     Status: None (Preliminary result)   Collection Time: 07/15/20  5:12 PM   Specimen: BLOOD  Result Value Ref Range Status   Specimen Description BLOOD RIGHT ANTECUBITAL  Final   Special Requests   Final    BOTTLES DRAWN AEROBIC AND ANAEROBIC Blood Culture adequate volume   Culture   Final    NO GROWTH < 24 HOURS Performed at [facility], [address] [place], [place] [address]    Report Status PENDING  Incomplete  Culture, blood (routine x 2) Call MD if unable to obtain prior to antibiotics being given     Status: None (Preliminary result)   Collection Time: 07/15/20  6:41 PM   Specimen: BLOOD LEFT HAND  Result Value Ref Range Status   Specimen Description  BLOOD LEFT HAND  Final   Special Requests   Final    BOTTLES DRAWN AEROBIC AND ANAEROBIC Blood Culture adequate volume   Culture   Final    NO GROWTH < 24 HOURS Performed at [facility], [place] [address], [place], [place] [address]    Report Status PENDING  Incomplete      Radiology Studies: DG Chest Port 1 View  Result Date: 07/15/2020 CLINICAL DATA:  SOB EXAM: PORTABLE CHEST 1 VIEW COMPARISON:  None. FINDINGS: No pneumothorax. Blunting of the left costophrenic sulcus. Patchy left basilar opacities. Cardiomegaly. Emphysematous changes. Multilevel spondylosis. IMPRESSION: Patchy left basilar opacities and trace left effusion, infiltrate versus edema. Cardiomegaly and emphysema. Electronically Signed   By: Primitivo Gauze M.D.   On: 07/15/2020 13:12   ECHOCARDIOGRAM COMPLETE  Result Date: 07/16/2020    ECHOCARDIOGRAM REPORT   Patient Name:   Ruben [name] Date of Exam: 07/16/2020 Medical Rec #:  [id number]  Height:       67.0 in Accession #:    [id number] Weight:       179.2 lb Date of Birth:  [birth year]   BSA:          1.930 m Patient Age:    52 years   BP:           94/56 mmHg Patient Gender: M          HR:           71 bpm. Exam Location:  Inpatient Procedure: 2D Echo, Cardiac Doppler and Color Doppler Indications:    I50.33 Acute on chronic diastolic (congestive) heart failure  History:        Patient has no prior history of Echocardiogram examinations.  COPD; Arrythmias:Atrial Fibrillation.  Sonographer:    Jonelle Sidle Dance Referring Phys: [place]  1. Left ventricular ejection fraction, by estimation, is 55 to 60%. The left ventricle has normal function. The left ventricle has no regional wall motion abnormalities. There is moderate left ventricular hypertrophy. Left ventricular diastolic parameters are indeterminate.  2. Right ventricular systolic function is moderately reduced. The right ventricular size is mildly enlarged. There is mildly elevated  pulmonary artery systolic pressure.  3. Left atrial size was moderately dilated.  4. Mild functional mitral stenosis due to severe MAC. The mitral valve is degenerative. Mild mitral valve regurgitation. Mild mitral stenosis. Severe mitral annular calcification.  5. Tricuspid valve regurgitation is moderate.  6. The aortic valve is tricuspid. There is severe calcifcation of the aortic valve. There is severe thickening of the aortic valve. Aortic valve regurgitation is not visualized. Severe aortic valve stenosis.  7. The inferior vena cava is normal in size with greater than 50% respiratory variability, suggesting right atrial pressure of 3 mmHg. FINDINGS  Left Ventricle: Left ventricular ejection fraction, by estimation, is 55 to 60%. The left ventricle has normal function. The left ventricle has no regional wall motion abnormalities. The left ventricular internal cavity size was normal in size. There is  moderate left ventricular hypertrophy. Left ventricular diastolic parameters are indeterminate. Right Ventricle: The right ventricular size is mildly enlarged. No increase in right ventricular wall thickness. Right ventricular systolic function is moderately reduced. There is mildly elevated pulmonary artery systolic pressure. The tricuspid regurgitant velocity is 3.08 m/s, and with an assumed right atrial pressure of 3 mmHg, the estimated right ventricular systolic pressure is 17.6 mmHg. Left Atrium: Left atrial size was moderately dilated. Right Atrium: Right atrial size was normal in size. Pericardium: There is no evidence of pericardial effusion. Mitral Valve: Mild functional mitral stenosis due to severe MAC. The mitral valve is degenerative in appearance. There is moderate thickening of the mitral valve leaflet(s). There is moderate calcification of the mitral valve leaflet(s). Severe mitral annular calcification. Mild mitral valve regurgitation. Mild mitral valve stenosis. MV peak gradient, 14.2 mmHg. The  mean mitral valve gradient is 4.5 mmHg. Tricuspid Valve: The tricuspid valve is normal in structure. Tricuspid valve regurgitation is moderate . No evidence of tricuspid stenosis. Aortic Valve: The aortic valve is tricuspid. There is severe calcifcation of the aortic valve. There is severe thickening of the aortic valve. There is severe aortic valve annular calcification. Aortic valve regurgitation is not visualized. Severe aortic  stenosis is present. Aortic valve mean gradient measures 37.0 mmHg. Aortic valve peak gradient measures 63.4 mmHg. Aortic valve area, by VTI measures 0.83 cm. Pulmonic Valve: The pulmonic valve was normal in structure. Pulmonic valve regurgitation is mild. No evidence of pulmonic stenosis. Aorta: The aortic root is normal in size and structure. Venous: The inferior vena cava is normal in size with greater than 50% respiratory variability, suggesting right atrial pressure of 3 mmHg. IAS/Shunts: No atrial level shunt detected by color flow Doppler.  LEFT VENTRICLE PLAX 2D LVIDd:         5.10 cm LVIDs:         3.10 cm LV PW:         1.40 cm LV IVS:        1.40 cm LVOT diam:     2.40 cm LV SV:         82 LV SV Index:   42 LVOT Area:     4.52 cm  RIGHT VENTRICLE            IVC RV Basal diam:  3.50 cm    IVC diam: 1.60 cm RV Mid diam:    3.00 cm RV S prime:     8.59 cm/s TAPSE (M-mode): 1.3 cm LEFT ATRIUM              Index        RIGHT ATRIUM           Index LA diam:        5.60 cm  2.90 cm/m   RA Area:     35.60 cm LA Vol (A2C):   252.0 ml 130.56 ml/m RA Volume:   135.00 ml 69.94 ml/m LA Vol (A4C):   185.0 ml 95.85 ml/m LA Biplane Vol: 223.0 ml 115.53 ml/m  AORTIC VALVE AV Area (Vmax):    0.87 cm AV Area (Vmean):   0.84 cm AV Area (VTI):     0.83 cm AV Vmax:           398.25 cm/s AV Vmean:          284.750 cm/s AV VTI:            0.980 m AV Peak Grad:      63.4 mmHg AV Mean Grad:      37.0 mmHg LVOT Vmax:         76.75 cm/s LVOT Vmean:        52.700 cm/s LVOT VTI:          0.180 m  LVOT/AV VTI ratio: 0.18  AORTA Ao Root diam: 3.60 cm Ao Asc diam:  3.50 cm MITRAL VALVE                TRICUSPID VALVE MV Area (PHT): 1.67 cm     TR Peak grad:   37.9 mmHg MV Peak grad:  14.2 mmHg    TR Vmax:        308.00 cm/s MV Mean grad:  4.5 mmHg MV Vmax:       1.88 m/s     SHUNTS MV Vmean:      89.2 cm/s    Systemic VTI:  0.18 m MV Decel Time: 454 msec     Systemic Diam: 2.40 cm MV E velocity: 181.00 cm/s MV A velocity: 58.20 cm/s MV E/A ratio:  3.11 Jenkins Rouge MD Electronically signed by Jenkins Rouge MD Signature Date/Time: 07/16/2020/3:21:03 PM    Final        LOS: 2 days   [facility] Pager on [email or web address]  07/17/2020, 11:03 AM

## 2020-07-17 NOTE — TOC Progression Note (Signed)
Transition of Care [facility]) - Progression Note    Patient Details  Name: Ruben [name] MRN: [id number] Date of Birth: 06/16/1927  Transition of Care [facility]) CM/SW Contact  Dianna Limbo [place], [place] Phone Number: [phone] 07/17/2020, 10:09 AM  Clinical Narrative:   [facility] team for discharge planning. Spoke to pt. He is alert and pleasant. He shares that he lives with son, daughter-in-law and wife. He shares that wife had a recent stroke. They have DME- wheelchair, walker, etc in the home presently. Denies need for DME equipment. Pt was seen by PT and OT. No needs identified at this time. Current plan is home with self-care.    Expected Discharge Plan: Home/Self Care Barriers to Discharge: No Barriers Identified  Expected Discharge Plan and Services Expected Discharge Plan: Home/Self Care In-house Referral: Clinical Social Work Discharge Planning Services: CM Consult   Living arrangements for the past 2 months: Single Family Home                                       Social Determinants of Health (SDOH) Interventions    Readmission Risk Interventions No flowsheet data found.

## 2020-07-17 NOTE — Plan of Care (Signed)
  Problem: Education: Goal: Knowledge of General Education information will improve Description: Including pain rating scale, medication(s)/side effects and non-pharmacologic comfort measures Outcome: Progressing   Problem: Clinical Measurements: Goal: Ability to maintain clinical measurements within normal limits will improve Outcome: Progressing   Problem: Clinical Measurements: Goal: Diagnostic test results will improve Outcome: Progressing   Problem: Education: Goal: Ability to demonstrate management of disease process will improve Outcome: Progressing   Problem: Education: Goal: Ability to verbalize understanding of medication therapies will improve Outcome: Progressing   Problem: Health Behavior/Discharge Planning: Goal: Ability to manage health-related needs will improve Outcome: Adequate for Discharge   Problem: Clinical Measurements: Goal: Will remain free from infection Outcome: Adequate for Discharge Goal: Respiratory complications will improve Outcome: Adequate for Discharge Goal: Cardiovascular complication will be avoided Outcome: Adequate for Discharge   Problem: Activity: Goal: Risk for activity intolerance will decrease Outcome: Adequate for Discharge   Problem: Nutrition: Goal: Adequate nutrition will be maintained Outcome: Adequate for Discharge   Problem: Coping: Goal: Level of anxiety will decrease Outcome: Adequate for Discharge   Problem: Elimination: Goal: Will not experience complications related to bowel motility Outcome: Adequate for Discharge Goal: Will not experience complications related to urinary retention Outcome: Adequate for Discharge   Problem: Pain Managment: Goal: General experience of comfort will improve Outcome: Adequate for Discharge   Problem: Safety: Goal: Ability to remain free from injury will improve Outcome: Adequate for Discharge   Problem: Skin Integrity: Goal: Risk for impaired skin integrity will  decrease Outcome: Adequate for Discharge   Problem: Activity: Goal: Capacity to carry out activities will improve Outcome: Adequate for Discharge   Problem: Cardiac: Goal: Ability to achieve and maintain adequate cardiopulmonary perfusion will improve Outcome: Adequate for Discharge   Problem: Clinical Measurements: Goal: Will remain free from infection Outcome: Adequate for Discharge

## 2020-07-17 NOTE — Consult Note (Addendum)
Cardiology Consultation:   Patient ID: Ruben [name] MRN: [id number]; DOB: 23-Nov-1926  Admit date: 07/15/2020 Date of Consult: 07/17/2020  Primary Care Provider: Alroy Dust, L.Marlou Sa, MD [facility] Cardiologist: New patient  Patient Profile:   Ruben [name] is a 86 y.o. male with a hx of chronic persistent atrial fibrillation, COPD who is being seen today for the evaluation of severe aortic stenosis and CHF at the request of Dr.  Bonnielee Haff.  History of Present Illness:   Ruben [name] is a very pleasant and very functional 86 y.o.malewith medical history significant ofCOPD (remote history of smoking); afib on Coumadin;who recently moved here from [place] to live with his son.  The patient is very functional, able to perform all activities of daily living, he walks without a cane or walker, he still drives and helps taking care of his wife will recently had a stroke.  He lives with his son.  He presented to the ER with cough productive of clear sputum for the last 3 days and shortness of breath.  Upon presentation he was hypoxic, started on 2 L of oxygen and wheezing.  He had no LE edema.  No orthopnea.  Occasional PND.  No fever.  Cough is occasionally productive of yellowish-white phlegm.  +wheezing.  Has been using his home neb BID as scheduled with prn HFA use.  He was diagnosed with acute respiratory failure with hypoxia, COPD exacerbation suspected upper respiratory infection with rhinovirus.  He is being treated with azithromycin and breathing treatment for COPD exacerbation. He was also found to be in acute diastolic CHF and his echo revealed severe aortic stenosis.  The patient states that he has never been diagnosed with coronary artery disease.  Or CHF prior to this presentation.  He denies any episodes of orthostatic hypotension, presyncope or syncope.  No prior episodes of chest pain or shortness of breath.  Past Medical History:  Diagnosis Date  . Arthritis   . CHF  (congestive heart failure) ([place])   . COPD (chronic obstructive pulmonary disease) ([place])     Past Surgical History:  Procedure Laterality Date  . TOTAL KNEE ARTHROPLASTY Bilateral    Inpatient Medications: Scheduled Meds: . azithromycin  500 mg Oral Daily  . docusate sodium  100 mg Oral BID  . dutasteride  0.5 mg Oral Daily  . metoprolol succinate  50 mg Oral Daily  . montelukast  10 mg Oral Daily  . potassium chloride  40 mEq Oral Once  . predniSONE  40 mg Oral Q breakfast  . tamsulosin  0.8 mg Oral QHS  . Warfarin - Pharmacist Dosing Inpatient   Does not apply q1600   Continuous Infusions: . sodium chloride 1,000 mL (07/15/20 1854)   PRN Meds: sodium chloride, acetaminophen **OR** acetaminophen, ALPRAZolam, bisacodyl, guaiFENesin-dextromethorphan, hydrALAZINE, HYDROcodone-acetaminophen, ipratropium, levalbuterol, morphine injection, ondansetron **OR** ondansetron (ZOFRAN) IV, polyethylene glycol, polyvinyl alcohol, tiZANidine  Allergies:   No Known Allergies  Social History:   Social History   Socioeconomic History  . Marital status: Married    Spouse name: Not on file  . Number of children: Not on file  . Years of education: Not on file  . Highest education level: Not on file  Occupational History  . Occupation: retired  Tobacco Use  . Smoking status: Former Smoker    Packs/day: 1.00    Years: 30.00    Pack years: 30.00    Quit date: 1975    Years since quitting: 46.9  . Smokeless tobacco: Never Used  Substance and Sexual Activity  . Alcohol use: Not Currently  . Drug use: Never  . Sexual activity: Not on file  Other Topics Concern  . Not on file  Social History Narrative  . Not on file   Social Determinants of Health   Financial Resource Strain: Not on file  Food Insecurity: Not on file  Transportation Needs: Not on file  Physical Activity: Not on file  Stress: Not on file  Social Connections: Not on file  Intimate Partner Violence: Not on file     Family History:   History reviewed. No pertinent family history.   ROS:  Please see the history of present illness.  All other ROS reviewed and negative.     Physical Exam/Data:   Vitals:   07/16/20 1108 07/16/20 1616 07/16/20 1943 07/17/20 0517  BP: (!) 94/56 (!) 106/59 108/68 109/80  Pulse: 69 77 71 69  Resp: _0 Temp: 98.2 F (36.8 C) 98.2 F (36.8 C) 98.5 F (36.9 C) 97.9 F (36.6 C)  TempSrc: Oral Oral Oral Oral  SpO2: 96% 97% 97% 96%  Weight:    81.9 kg  Height:        Intake/Output Summary (Last 24 hours) at 07/17/2020 0850 Last data filed at 07/17/2020 0522 Gross per 24 hour  Intake 840 ml  Output 1175 ml  Net -335 ml   Last 3 Weights 07/17/2020 07/16/2020 07/15/2020  Weight (lbs) 180 lb 8 oz 179 lb 3.7 oz 181 lb 10.5 oz  Weight (kg) 81.874 kg 81.3 kg 82.4 kg     Body mass index is 28.27 kg/m.  General:  Well nourished, well developed, in no acute distress HEENT: normal Lymph: no adenopathy Neck: no JVD Endocrine:  No thryomegaly Vascular: No carotid bruits; FA pulses 2+ bilaterally without bruits  Cardiac: Irregular rhythm; 5 out of 6 holosystolic murmur, missing S2 Lungs: Bilateral expiratory wheezing, mild rales at both bases Abd: soft, nontender, no hepatomegaly  Ext: no edema Musculoskeletal:  No deformities, BUE and BLE strength normal and equal Skin: warm and dry  Neuro:  CNs 2-12 intact, no focal abnormalities noted Psych:  Normal affect   EKG:  The EKG was personally reviewed and demonstrates:  Atrial fibrillation, RBBB, negative T waves in the inferior leads Telemetry:  Telemetry was personally reviewed and demonstrates: Atrial fibrillation with ventricular rates 70 to 100 bpm.  Laboratory Data:  High Sensitivity Troponin:  No results for input(s): TROPONINIHS in the last 720 hours.   Chemistry Recent Labs  Lab 07/15/20 1256 07/16/20 0259 07/16/20 1316 07/17/20 0442  NA 137 140  --  138  K 3.1* 2.6* 3.7 3.4*  CL 93* 95*   --  97*  CO2 30 34*  --  31  GLUCOSE 116* 144*  --  145*  BUN 23 23  --  36*  CREATININE 0.98 0.90  --  0.96  CALCIUM 8.8* 8.6*  --  8.5*  GFRNONAA >60 >60  --  >60  ANIONGAP 14 11  --  10    Recent Labs  Lab 07/15/20 1256  PROT 6.9  ALBUMIN 3.6  AST 20  ALT 15  ALKPHOS 66  BILITOT 1.8*   Hematology Recent Labs  Lab 07/15/20 1256 07/16/20 0259 07/17/20 0442  WBC 11.0* 4.5 9.3  RBC 4.06* 3.96* 3.75*  HGB 13.1 12.7* 12.1*  HCT 40.3 38.5* 36.8*  MCV 99.3 97.2 98.1  MCH 32.3 32.1 32.3  MCHC 32.5 33.0 32.9  RDW 15.8*  15.4 15.6*  PLT 208 198 214   BNP Recent Labs  Lab 07/15/20 1256  BNP 487.9*    DDimer No results for input(s): DDIMER in the last 168 hours.   Radiology/Studies:   TTE: 07/16/2020  1. Left ventricular ejection fraction, by estimation, is 55 to 60%. The  left ventricle has normal function. The left ventricle has no regional  wall motion abnormalities. There is moderate left ventricular hypertrophy.  Left ventricular diastolic  parameters are indeterminate.  2. Right ventricular systolic function is moderately reduced. The right  ventricular size is mildly enlarged. There is mildly elevated pulmonary  artery systolic pressure.  3. Left atrial size was moderately dilated.  4. Mild functional mitral stenosis due to severe MAC. The mitral valve is  degenerative. Mild mitral valve regurgitation. Mild mitral stenosis.  Severe mitral annular calcification.  5. Tricuspid valve regurgitation is moderate.  6. The aortic valve is tricuspid. There is severe calcifcation of the  aortic valve. There is severe thickening of the aortic valve. Aortic valve  regurgitation is not visualized. Severe aortic valve stenosis.  7. The inferior vena cava is normal in size with greater than 50%  respiratory variability, suggesting right atrial pressure of 3 mmHg  Assessment and Plan:   1. Acute on chronic diastolic CHF -Secondary to severe aortic stenosis and  acute on chronic respiratory failure and COPD exacerbation - BNP 487 -The patient is mildly fluid overloaded, I will continue Lasix 40 mg IV twice daily till tomorrow and reassess -Aggressive potassium replacement  2. Severe aortic stenosis -The patient does have evidence for severe aortic stenosis With peak/mean transaortic gradient 63/37 mmHg, he has significant murmur with missing S2 on physical exam, he has not been symptomatic till now, however in the settings of acute respiratory failure he decompensated, he also has moderate pulmonary hypertension, most probably sec to elevated left sided pressures -We will have structural team see him on Monday to initiate TAVR work-up at a later date.  Despite patient's age he is highly functional, has preserved LVEF and kidney function and should be considered for TAVR.  3. Hypokalemia -I will start potassium chloride 40 mEq p.o. twice daily  4. Hypertension -Currently rather hypotensive  5. Chronic persistent atrial fibrillation on Coumadin at home  -Rate controlled, most recent INR 3.1 today.  For questions or updates, please contact [place] Please consult [email or web address] for contact info under    Signed, Ena Dawley, MD  07/17/2020 8:50 AM

## 2020-07-17 NOTE — Progress Notes (Signed)
ANTICOAGULATION CONSULT NOTE  Pharmacy Consult for warfarin Indication: atrial fibrillation  No Known Allergies  Patient Measurements: Height: 5\' 7"  (170.2 cm) Weight: 81.9 kg (180 lb 8 oz) IBW/kg (Calculated) : 66.1  Vital Signs: Temp: 97.9 F (36.6 C) (12/12 0517) Temp Source: Oral (12/12 0517) BP: 109/80 (12/12 0517) Pulse Rate: 69 (12/12 0517)  Labs: Recent Labs    07/15/20 1256 07/15/20 1712 07/16/20 0259 07/17/20 0442  HGB 13.1  --  12.7* 12.1*  HCT 40.3  --  38.5* 36.8*  PLT 208  --  198 214  LABPROT  --  26.4* 25.3* 31.0*  INR  --  2.5* 2.4* 3.1*  CREATININE 0.98  --  0.90 0.96    Estimated Creatinine Clearance: 51.3 mL/min (by C-G formula based on SCr of 0.96 mg/dL).   Medical History: Past Medical History:  Diagnosis Date  . Arthritis   . CHF (congestive heart failure) ([facility])   . COPD (chronic obstructive pulmonary disease) ([facility])     Medications:  Medications Prior to Admission  Medication Sig Dispense Refill Last Dose  . acetaminophen (TYLENOL) 500 MG tablet Take 1,000 mg by mouth every 6 (six) hours as needed for headache (pain).   Past Week at Unknown time  . ALPRAZolam (XANAX) 0.25 MG tablet Take 0.125 mg by mouth at bedtime as needed for anxiety or sleep.   unknown  . dutasteride (AVODART) 0.5 MG capsule Take 0.5 mg by mouth daily.   07/15/2020 at am  . furosemide (LASIX) 40 MG tablet Take 40 mg by mouth daily.   07/15/2020 at am  . Homeopathic Products [facility] ALLERGY RELIEF NA) Place 1 spray into both nostrils 2 (two) times daily as needed (congestion).   Past Week at Unknown time  . HYDROcodone-acetaminophen (NORCO) 7.5-325 MG tablet Take 1 tablet by mouth 3 (three) times daily as needed (pain).   07/13/2020  . levalbuterol (XOPENEX HFA) 45 MCG/ACT inhaler Inhale 2 puffs into the lungs every 4 (four) hours as needed for wheezing or shortness of breath.   07/14/2020 at Unknown time  . levalbuterol (XOPENEX) 0.63 MG/3ML nebulizer solution Take 0.63  mg by nebulization 2 (two) times daily.   07/14/2020 at pm  . metolazone (ZAROXOLYN) 2.5 MG tablet Take 2.5 mg by mouth See admin instructions. Take one tablet (2.5 mg) by mouth twice weekly - Mondays and Thursdays   07/14/2020 at am  . metoprolol succinate (TOPROL-XL) 50 MG 24 hr tablet Take 50 mg by mouth daily.   07/15/2020 at 8:30am  . montelukast (SINGULAIR) 10 MG tablet Take 10 mg by mouth daily.   07/15/2020 at am  . potassium chloride (KLOR-CON) 8 MEQ tablet Take 8 mEq by mouth daily.   07/15/2020 at am  . Propylene Glycol (SYSTANE COMPLETE) 0.6 % SOLN Place 1 drop into both eyes daily as needed (dry eyes).   Past Month at Unknown time  . tamsulosin (FLOMAX) 0.4 MG CAPS capsule Take 0.8 mg by mouth at bedtime.   07/14/2020 at pm  . tizanidine (ZANAFLEX) 2 MG capsule Take 2 mg by mouth at bedtime as needed for muscle spasms.   unknown  . warfarin (COUMADIN) 6 MG tablet Take 6 mg by mouth at bedtime.   07/14/2020 at 2100    Assessment: 44 YOM with a h/o Afib on warfarin presents with productive cough. Pharmacy consulted to resume home warfarin therapy   H/H and Plt wnl. SCr wnl. INR is slightly supratherapeutic on home warfarin regimen of 6 mg daily. Patient  eating 75% of meals.   Goal of Therapy:  INR 2-3 Monitor platelets by anticoagulation protocol: Yes   Plan:  -Warfarin 5 mg x 1 dose  -Monitor daily PT/INR and s/s of bleeding   Kinnie Feil, PharmD PGY1 Acute Care Pharmacy Resident Phone: [phone] 07/17/2020 11:17 AM  Please check [email or web address] for unit specific pharmacy phone numbers.

## 2020-07-18 ENCOUNTER — Inpatient Hospital Stay (HOSPITAL_COMMUNITY): Payer: Medicare Other

## 2020-07-18 DIAGNOSIS — I480 Paroxysmal atrial fibrillation: Secondary | ICD-10-CM

## 2020-07-18 LAB — CBC
HCT: 40 % (ref 39.0–52.0)
Hemoglobin: 12.7 g/dL — ABNORMAL LOW (ref 13.0–17.0)
MCH: 32.2 pg (ref 26.0–34.0)
MCHC: 31.8 g/dL (ref 30.0–36.0)
MCV: 101.5 fL — ABNORMAL HIGH (ref 80.0–100.0)
Platelets: 232 10*3/uL (ref 150–400)
RBC: 3.94 MIL/uL — ABNORMAL LOW (ref 4.22–5.81)
RDW: 15.6 % — ABNORMAL HIGH (ref 11.5–15.5)
WBC: 9.3 10*3/uL (ref 4.0–10.5)
nRBC: 0 % (ref 0.0–0.2)

## 2020-07-18 LAB — BASIC METABOLIC PANEL
Anion gap: 10 (ref 5–15)
BUN: 35 mg/dL — ABNORMAL HIGH (ref 8–23)
CO2: 34 mmol/L — ABNORMAL HIGH (ref 22–32)
Calcium: 8.6 mg/dL — ABNORMAL LOW (ref 8.9–10.3)
Chloride: 98 mmol/L (ref 98–111)
Creatinine, Ser: 0.96 mg/dL (ref 0.61–1.24)
GFR, Estimated: >60 mL/min (ref >60)
Glucose, Bld: 106 mg/dL — ABNORMAL HIGH (ref 70–99)
Potassium: 3.8 mmol/L (ref 3.5–5.1)
Sodium: 142 mmol/L (ref 135–145)

## 2020-07-18 LAB — PROTIME-INR
INR: 3.5 — ABNORMAL HIGH (ref 0.8–1.2)
Prothrombin Time: 34 seconds — ABNORMAL HIGH (ref 11.4–15.2)

## 2020-07-18 MED ORDER — POTASSIUM CHLORIDE CRYS ER 20 MEQ PO TBCR: 20.0000 meq | EXTENDED_RELEASE_TABLET | Freq: Two times a day (BID) | ORAL | Status: DC

## 2020-07-18 NOTE — Progress Notes (Signed)
ANTICOAGULATION CONSULT NOTE  Pharmacy Consult:  Coumadin Indication: atrial fibrillation  No Known Allergies  Patient Measurements: Height: 5\' 7"  (170.2 cm) Weight: 80.4 kg (177 lb 3.2 oz) (scale a) IBW/kg (Calculated) : 66.1  Vital Signs: Temp: 97.8 F (36.6 C) (12/13 0801) Temp Source: Oral (12/13 0801) BP: 131/71 (12/13 0801) Pulse Rate: 74 (12/13 0801)  Labs: Recent Labs    07/16/20 0259 07/17/20 0442 07/18/20 0642  HGB 12.7* 12.1* 12.7*  HCT 38.5* 36.8* 40.0  PLT 198 214 232  LABPROT 25.3* 31.0* 34.0*  INR 2.4* 3.1* 3.5*  CREATININE 0.90 0.96 0.96    Estimated Creatinine Clearance: 50.9 mL/min (by C-G formula based on SCr of 0.96 mg/dL).   Assessment: 76 YOM with history of Afib to continue on Coumadin from PTA.  INR supra-therapeutic today at 3.5; no bleeding reported.  PO intake has improved.  Home Coumadin regimen:  6mg  daily  Goal of Therapy:  INR 2-3 Monitor platelets by anticoagulation protocol: Yes   Plan:  Hold Coumadin today Daily PT / INR  Shylin Keizer D. , PharmD, BCPS, BCCCP 07/18/2020, 10:42 AM

## 2020-07-18 NOTE — Progress Notes (Addendum)
Cardiology Progress Note  Patient ID: Ruben [name] MRN: [id number] DOB: [birth year] Date of Encounter: 07/18/2020  Primary Cardiologist: No primary care provider on file.  Subjective   Chief Complaint:   Shortness of breath  HPI: Good diuresis overnight.  Still volume up.  Diffusely wheezy.  ROS:  All other ROS reviewed and negative. Pertinent positives noted in the HPI.     Inpatient Medications  Scheduled Meds: . azithromycin  500 mg Oral Daily  . docusate sodium  100 mg Oral BID  . dutasteride  0.5 mg Oral Daily  . furosemide  40 mg Intravenous BID  . metoprolol succinate  50 mg Oral Daily  . montelukast  10 mg Oral Daily  . potassium chloride  40 mEq Oral BID  . predniSONE  40 mg Oral Q breakfast  . tamsulosin  0.8 mg Oral QHS  . Warfarin - Pharmacist Dosing Inpatient   Does not apply q1600   Continuous Infusions: . sodium chloride 1,000 mL (07/15/20 1854)   PRN Meds: sodium chloride, acetaminophen **OR** acetaminophen, ALPRAZolam, bisacodyl, guaiFENesin-dextromethorphan, hydrALAZINE, HYDROcodone-acetaminophen, ipratropium, levalbuterol, morphine injection, ondansetron **OR** ondansetron (ZOFRAN) IV, polyethylene glycol, polyvinyl alcohol, tiZANidine   Vital Signs   Vitals:   07/17/20 1141 07/17/20 2006 07/18/20 0652 07/18/20 0801  BP: 119/73 111/72 138/87 131/71  Pulse: 98 (!) 47 65 74  Resp: _0 Temp: 98 F (36.7 C) 98.2 F (36.8 C) 98 F (36.7 C) 97.8 F (36.6 C)  TempSrc: Oral Oral Oral Oral  SpO2: 99% 97% 98% 96%  Weight:   80.4 kg   Height:        Intake/Output Summary (Last 24 hours) at 07/18/2020 0825 Last data filed at 07/18/2020 0753 Gross per 24 hour  Intake 480 ml  Output 3225 ml  Net -2745 ml   Last 3 Weights 07/18/2020 07/17/2020 07/16/2020  Weight (lbs) 177 lb 3.2 oz 180 lb 8 oz 179 lb 3.7 oz  Weight (kg) 80.377 kg 81.874 kg 81.3 kg     Telemetry  Overnight telemetry shows atrial fibrillation heart rate 70-80 bpm, which I  personally reviewed.   ECG  The most recent ECG shows atrial fibrillation, heart rate 74, right bundle branch block noted, which I personally reviewed.   Physical Exam   Vitals:   07/17/20 1141 07/17/20 2006 07/18/20 0652 07/18/20 0801  BP: 119/73 111/72 138/87 131/71  Pulse: 98 (!) 47 65 74  Resp: _1 Temp: 98 F (36.7 C) 98.2 F (36.8 C) 98 F (36.7 C) 97.8 F (36.6 C)  TempSrc: Oral Oral Oral Oral  SpO2: 99% 97% 98% 96%  Weight:   80.4 kg   Height:         Intake/Output Summary (Last 24 hours) at 07/18/2020 0825 Last data filed at 07/18/2020 0753 Gross per 24 hour  Intake 480 ml  Output 3225 ml  Net -2745 ml    Last 3 Weights 07/18/2020 07/17/2020 07/16/2020  Weight (lbs) 177 lb 3.2 oz 180 lb 8 oz 179 lb 3.7 oz  Weight (kg) 80.377 kg 81.874 kg 81.3 kg    Body mass index is 27.75 kg/m.   General: Well nourished, well developed, in no acute distress Head: Atraumatic, normal size  Eyes: PEERLA, EOMI  Neck: Supple, +JVD Endocrine: No thryomegaly Cardiac: Normal S1, S2; RRR; no murmurs, rubs, or gallops Lungs: Diffuse wheezing noted bilaterally Abd: Soft, nontender, no hepatomegaly  Ext: No edema, pulses 2+ Musculoskeletal: No deformities, BUE and BLE strength  normal and equal Skin: Warm and dry, no rashes   Neuro: Alert and oriented to person, place, time, and situation, CNII-XII grossly intact, no focal deficits  Psych: Normal mood and affect   Labs  High Sensitivity Troponin:  No results for input(s): TROPONINIHS in the last 720 hours.   Cardiac EnzymesNo results for input(s): TROPONINI in the last 168 hours. No results for input(s): TROPIPOC in the last 168 hours.  Chemistry Recent Labs  Lab 07/15/20 1256 07/16/20 0259 07/16/20 1316 07/17/20 0442 07/18/20 0642  NA 137 140  --  138 142  K 3.1* 2.6* 3.7 3.4* 3.8  CL 93* 95*  --  97* 98  CO2 30 34*  --  31 34*  GLUCOSE 116* 144*  --  145* 106*  BUN 23 23  --  36* 35*  CREATININE 0.98 0.90  --   0.96 0.96  CALCIUM 8.8* 8.6*  --  8.5* 8.6*  PROT 6.9  --   --   --   --   ALBUMIN 3.6  --   --   --   --   AST 20  --   --   --   --   ALT 15  --   --   --   --   ALKPHOS 66  --   --   --   --   BILITOT 1.8*  --   --   --   --   GFRNONAA >60 >60  --  >60 >60  ANIONGAP 14 11  --  10 10    Hematology Recent Labs  Lab 07/16/20 0259 07/17/20 0442 07/18/20 0642  WBC 4.5 9.3 9.3  RBC 3.96* 3.75* 3.94*  HGB 12.7* 12.1* 12.7*  HCT 38.5* 36.8* 40.0  MCV 97.2 98.1 101.5*  MCH 32.1 32.3 32.2  MCHC 33.0 32.9 31.8  RDW 15.4 15.6* 15.6*  PLT 198 214 232   BNP Recent Labs  Lab 07/15/20 1256  BNP 487.9*    DDimer No results for input(s): DDIMER in the last 168 hours.   Radiology  ECHOCARDIOGRAM COMPLETE  Result Date: 07/16/2020    ECHOCARDIOGRAM REPORT   Patient Name:   Ruben [name] Date of Exam: 07/16/2020 Medical Rec #:  [id number]  Height:       67.0 in Accession #:    [id number] Weight:       179.2 lb Date of Birth:  1926/12/30   BSA:          1.930 m Patient Age:    86 years   BP:           94/56 mmHg Patient Gender: M          HR:           71 bpm. Exam Location:  Inpatient Procedure: 2D Echo, Cardiac Doppler and Color Doppler Indications:    I50.33 Acute on chronic diastolic (congestive) heart failure  History:        Patient has no prior history of Echocardiogram examinations.                 COPD; Arrythmias:Atrial Fibrillation.  Sonographer:    Jonelle Sidle Dance Referring Phys: [place]  1. Left ventricular ejection fraction, by estimation, is 55 to 60%. The left ventricle has normal function. The left ventricle has no regional wall motion abnormalities. There is moderate left ventricular hypertrophy. Left ventricular diastolic parameters are indeterminate.  2. Right ventricular systolic function is moderately reduced. The right  ventricular size is mildly enlarged. There is mildly elevated pulmonary artery systolic pressure.  3. Left atrial size was moderately  dilated.  4. Mild functional mitral stenosis due to severe MAC. The mitral valve is degenerative. Mild mitral valve regurgitation. Mild mitral stenosis. Severe mitral annular calcification.  5. Tricuspid valve regurgitation is moderate.  6. The aortic valve is tricuspid. There is severe calcifcation of the aortic valve. There is severe thickening of the aortic valve. Aortic valve regurgitation is not visualized. Severe aortic valve stenosis.  7. The inferior vena cava is normal in size with greater than 50% respiratory variability, suggesting right atrial pressure of 3 mmHg. FINDINGS  Left Ventricle: Left ventricular ejection fraction, by estimation, is 55 to 60%. The left ventricle has normal function. The left ventricle has no regional wall motion abnormalities. The left ventricular internal cavity size was normal in size. There is  moderate left ventricular hypertrophy. Left ventricular diastolic parameters are indeterminate. Right Ventricle: The right ventricular size is mildly enlarged. No increase in right ventricular wall thickness. Right ventricular systolic function is moderately reduced. There is mildly elevated pulmonary artery systolic pressure. The tricuspid regurgitant velocity is 3.08 m/s, and with an assumed right atrial pressure of 3 mmHg, the estimated right ventricular systolic pressure is 36.6 mmHg. Left Atrium: Left atrial size was moderately dilated. Right Atrium: Right atrial size was normal in size. Pericardium: There is no evidence of pericardial effusion. Mitral Valve: Mild functional mitral stenosis due to severe MAC. The mitral valve is degenerative in appearance. There is moderate thickening of the mitral valve leaflet(s). There is moderate calcification of the mitral valve leaflet(s). Severe mitral annular calcification. Mild mitral valve regurgitation. Mild mitral valve stenosis. MV peak gradient, 14.2 mmHg. The mean mitral valve gradient is 4.5 mmHg. Tricuspid Valve: The tricuspid  valve is normal in structure. Tricuspid valve regurgitation is moderate . No evidence of tricuspid stenosis. Aortic Valve: The aortic valve is tricuspid. There is severe calcifcation of the aortic valve. There is severe thickening of the aortic valve. There is severe aortic valve annular calcification. Aortic valve regurgitation is not visualized. Severe aortic  stenosis is present. Aortic valve mean gradient measures 37.0 mmHg. Aortic valve peak gradient measures 63.4 mmHg. Aortic valve area, by VTI measures 0.83 cm. Pulmonic Valve: The pulmonic valve was normal in structure. Pulmonic valve regurgitation is mild. No evidence of pulmonic stenosis. Aorta: The aortic root is normal in size and structure. Venous: The inferior vena cava is normal in size with greater than 50% respiratory variability, suggesting right atrial pressure of 3 mmHg. IAS/Shunts: No atrial level shunt detected by color flow Doppler.  LEFT VENTRICLE PLAX 2D LVIDd:         5.10 cm LVIDs:         3.10 cm LV PW:         1.40 cm LV IVS:        1.40 cm LVOT diam:     2.40 cm LV SV:         82 LV SV Index:   42 LVOT Area:     4.52 cm  RIGHT VENTRICLE            IVC RV Basal diam:  3.50 cm    IVC diam: 1.60 cm RV Mid diam:    3.00 cm RV S prime:     8.59 cm/s TAPSE (M-mode): 1.3 cm LEFT ATRIUM              Index  RIGHT ATRIUM           Index LA diam:        5.60 cm  2.90 cm/m   RA Area:     35.60 cm LA Vol (A2C):   252.0 ml 130.56 ml/m RA Volume:   135.00 ml 69.94 ml/m LA Vol (A4C):   185.0 ml 95.85 ml/m LA Biplane Vol: 223.0 ml 115.53 ml/m  AORTIC VALVE AV Area (Vmax):    0.87 cm AV Area (Vmean):   0.84 cm AV Area (VTI):     0.83 cm AV Vmax:           398.25 cm/s AV Vmean:          284.750 cm/s AV VTI:            0.980 m AV Peak Grad:      63.4 mmHg AV Mean Grad:      37.0 mmHg LVOT Vmax:         76.75 cm/s LVOT Vmean:        52.700 cm/s LVOT VTI:          0.180 m LVOT/AV VTI ratio: 0.18  AORTA Ao Root diam: 3.60 cm Ao Asc diam:  3.50  cm MITRAL VALVE                TRICUSPID VALVE MV Area (PHT): 1.67 cm     TR Peak grad:   37.9 mmHg MV Peak grad:  14.2 mmHg    TR Vmax:        308.00 cm/s MV Mean grad:  4.5 mmHg MV Vmax:       1.88 m/s     SHUNTS MV Vmean:      89.2 cm/s    Systemic VTI:  0.18 m MV Decel Time: 454 msec     Systemic Diam: 2.40 cm MV E velocity: 181.00 cm/s MV A velocity: 58.20 cm/s MV E/A ratio:  3.11 Jenkins Rouge MD Electronically signed by Jenkins Rouge MD Signature Date/Time: 07/16/2020/3:21:03 PM    Final     Cardiac Studies  TTE 07/16/2020  1. Left ventricular ejection fraction, by estimation, is 55 to 60%. The  left ventricle has normal function. The left ventricle has no regional  wall motion abnormalities. There is moderate left ventricular hypertrophy.  Left ventricular diastolic  parameters are indeterminate.  2. Right ventricular systolic function is moderately reduced. The right  ventricular size is mildly enlarged. There is mildly elevated pulmonary  artery systolic pressure.  3. Left atrial size was moderately dilated.  4. Mild functional mitral stenosis due to severe MAC. The mitral valve is  degenerative. Mild mitral valve regurgitation. Mild mitral stenosis.  Severe mitral annular calcification.  5. Tricuspid valve regurgitation is moderate.  6. The aortic valve is tricuspid. There is severe calcifcation of the  aortic valve. There is severe thickening of the aortic valve. Aortic valve  regurgitation is not visualized. Severe aortic valve stenosis.  7. The inferior vena cava is normal in size with greater than 50%  respiratory variability, suggesting right atrial pressure of 3 mmHg.   Patient Profile  Ruben [name] is a 86 y.o. male with permanent atrial fibrillation, COPD who was admitted on 07/15/2020 for acute decompensated diastolic heart failure, severe aortic stenosis with rhinovirus pneumonia.  Assessment & Plan   1.  Severe aortic stenosis/diastolic heart failure/mitral  stenosis -Admitted with rhinovirus.  Also has decompensated diastolic heart failure.  Has severe aortic stenosis.  BNP was elevated. -He  does have crackles of the lung bases but he is diffusely wheezy in all lung fields.  I suspect most of his symptoms are related to the rhinovirus. -Still volume up.  Continue with 40 mg IV Lasix twice daily today.  I suspect he will be euvolemic tomorrow. -Harsh murmur consistent with severe aortic stenosis.  Gradient likely lower in the setting of mitral stenosis. -I do agree that he needs to have a work-up of this will likely be done in the outpatient setting. -Also need to evaluate the mitral valve a bit closer.  I suspect there is more than mild mitral stenosis. -He is currently on contact precautions for rhinovirus.  His symptoms to me are more worrisome for pneumonia and rhinovirus.  I think he needs to [name] up from this pneumonia and then be considered for outpatient work-up.  I did discuss this with his son and the structural heart team.  2.  Permanent atrial fibrillation -We will consider transition to [place] at some point.  On Coumadin for now.  He will continue this.  For questions or updates, please contact Mercedes Please consult [email or web address] for contact info under   Time Spent with Patient: I have spent a total of 35 minutes with patient reviewing hospital notes, telemetry, EKGs, labs and examining the patient as well as establishing an assessment and plan that was discussed with the patient.  > 50% of time was spent in direct patient care.    Signed, Addison Naegeli. Audie Box, [place]  07/18/2020 8:25 AM

## 2020-07-18 NOTE — Progress Notes (Signed)
[facility] PROGRESS NOTE   Cecil Vandyke CNO:[id number] DOB: 08/04/1927 DOA: 07/15/2020  PCP: Alroy Dust, L.Marlou Sa, MD  Brief History/Interval Summary: 86 y.o. male with medical history significant of COPD; afib on Coumadin; and unspecified CHF presenting with cough, SOB.    Patient was noted to be hypoxic.  He was hospitalized for further management.     Reason for Visit: Acute respiratory failure with hypoxia  Consultants: [facility]  Procedures: None  Antibiotics: Anti-infectives (From admission, onward)   Start     Dose/Rate Route Frequency Ordered Stop   07/16/20 1730  azithromycin (ZITHROMAX) tablet 500 mg       "Followed by" Linked Group Details   500 mg Oral Daily 07/15/20 1639 07/20/20 0959   07/15/20 1730  azithromycin (ZITHROMAX) 500 mg in sodium chloride 0.9 % 250 mL IVPB       "Followed by" Linked Group Details   500 mg 250 mL/hr over 60 Minutes Intravenous Every 24 hours 07/15/20 1639 07/16/20 0845      Subjective/Interval History: Patient mentions that he is feeling better.  Continues to have a cough now with clear expectoration.  Denies any chest pain.  Feels stronger.      Assessment/Plan:  Acute respiratory failure with hypoxia/COPD exacerbation/rhinovirus His symptoms likely triggered by rhinovirus infection.  Patient was also noted to be wheezing at the time of admission.  He was given nebulizer treatment and steroids.  Started on azithromycin for purulent expectoration.  Slowly getting better.  Still noted to be on oxygen.  Try to wean it off today if possible.  Continue incentive spirometry and flutter valve.   Repeat chest x-ray.  Acute on chronic diastolic CHF Patient with history of CHF thought to be diastolic.  No obvious pulmonary edema noted on chest x-ray though reports suggest that there might have been some.  BNP was noted to be elevated.  He was placed on IV diuretics. Cardiology is following.   Echocardiogram shows normal systolic  function.  However severe aortic stenosis was noted.  See below. Continue to monitor strict ins and outs and daily weights.  Severe aortic stenosis Noted on echocardiogram.  Patient mentions that he has been told by his cardiologist in [place] that he had aortic stenosis but he was not sure about the severity.  Discussed also with patient's son who works in the lab at [employer].   Cardiology was consulted.  They are considering him for TAVR.  He will need further work-up.  However this will have to wait till he is over his acute illness.    Hypokalemia Potassium noted to be normal this morning.  Continue to replace and check while he is on IV diuretics.  Magnesium was 2.1 yesterday.     History of chronic atrial fibrillation Rate is controlled with metoprolol.  He is on warfarin for anticoagulation.  Pharmacy managing warfarin dose.  Normocytic anemia No evidence of overt bleeding.  Continue to monitor.  Essential hypertension Blood pressure noted to be stable.  Continue to monitor.    History of BPH Continue Flomax and Avodart.  Chronic pain Noted to be on Norco and tizanidine.    DVT Prophylaxis: On warfarin Code Status: DNR Family Communication: Discussed with patient.  Cardiology has discussed with the son. Disposition Plan: Hopefully return home when improved.  Seen by PT and OT.  Status is: Inpatient  Remains inpatient appropriate because:IV treatments appropriate due to intensity of illness or inability to take PO and Inpatient level of  care appropriate due to severity of illness   Dispo: The patient is from: Home              Anticipated d/c is to: Home              Anticipated d/c date is: 1 day              Patient currently is not medically stable to d/c.       Medications:  Scheduled: . azithromycin  500 mg Oral Daily  . docusate sodium  100 mg Oral BID  . dutasteride  0.5 mg Oral Daily  . furosemide  40 mg Intravenous BID  . metoprolol succinate  50  mg Oral Daily  . montelukast  10 mg Oral Daily  . potassium chloride  40 mEq Oral BID  . predniSONE  40 mg Oral Q breakfast  . tamsulosin  0.8 mg Oral QHS  . Warfarin - Pharmacist Dosing Inpatient   Does not apply q1600   Continuous: . sodium chloride 1,000 mL (07/15/20 1854)   GHW:EXHBZJ chloride, acetaminophen **OR** acetaminophen, ALPRAZolam, bisacodyl, guaiFENesin-dextromethorphan, hydrALAZINE, HYDROcodone-acetaminophen, ipratropium, levalbuterol, morphine injection, ondansetron **OR** ondansetron (ZOFRAN) IV, polyethylene glycol, polyvinyl alcohol, tiZANidine   Objective:  Vital Signs  Vitals:   07/17/20 1141 07/17/20 2006 07/18/20 0652 07/18/20 0801  BP: 119/73 111/72 138/87 131/71  Pulse: 98 (!) 47 65 74  Resp: _0 Temp: 98 F (36.7 C) 98.2 F (36.8 C) 98 F (36.7 C) 97.8 F (36.6 C)  TempSrc: Oral Oral Oral Oral  SpO2: 99% 97% 98% 96%  Weight:   80.4 kg   Height:        Intake/Output Summary (Last 24 hours) at 07/18/2020 0959 Last data filed at 07/18/2020 0853 Gross per 24 hour  Intake 480 ml  Output 3575 ml  Net -3095 ml   Filed Weights   07/16/20 0348 07/17/20 0517 07/18/20 0652  Weight: 81.3 kg 81.9 kg 80.4 kg    General appearance: Awake alert.  In no distress Resp: Coarse breath sounds.  Continues to have wheezing bilaterally.  Few crackles at the bases.  No rhonchi.   Cardio: S1-S2 is normal regular.  Systolic murmur appreciated over the precordium GI: Abdomen is soft.  Nontender nondistended.  Bowel sounds are present normal.  No masses organomegaly Extremities: No edema.  Full range of motion of lower extremities. Neurologic: Alert and oriented x3.  No focal neurological deficits.      Lab Results:  Data Reviewed: I have personally reviewed following labs and imaging studies  CBC: Recent Labs  Lab 07/15/20 1256 07/16/20 0259 07/17/20 0442 07/18/20 0642  WBC 11.0* 4.5 9.3 9.3  NEUTROABS 9.4*  --   --   --   HGB 13.1 12.7*  12.1* 12.7*  HCT 40.3 38.5* 36.8* 40.0  MCV 99.3 97.2 98.1 101.5*  PLT 208 198 214 696    Basic Metabolic Panel: Recent Labs  Lab 07/15/20 1256 07/16/20 0259 07/16/20 1316 07/17/20 0442 07/18/20 0642  NA 137 140  --  138 142  K 3.1* 2.6* 3.7 3.4* 3.8  CL 93* 95*  --  97* 98  CO2 30 34*  --  31 34*  GLUCOSE 116* 144*  --  145* 106*  BUN 23 23  --  36* 35*  CREATININE 0.98 0.90  --  0.96 0.96  CALCIUM 8.8* 8.6*  --  8.5* 8.6*  MG  --  1.8  --  2.1  --  GFR: Estimated Creatinine Clearance: 50.9 mL/min (by C-G formula based on SCr of 0.96 mg/dL).  Liver Function Tests: Recent Labs  Lab 07/15/20 1256  AST 20  ALT 15  ALKPHOS 66  BILITOT 1.8*  PROT 6.9  ALBUMIN 3.6     Coagulation Profile: Recent Labs  Lab 07/15/20 1712 07/16/20 0259 07/17/20 0442 07/18/20 0642  INR 2.5* 2.4* 3.1* 3.5*    Thyroid Function Tests: Recent Labs    07/15/20 1634  TSH 0.398      Recent Results (from the past 240 hour(s))  Resp Panel by RT-PCR (Flu A&B, Covid) Nasopharyngeal Swab     Status: None   Collection Time: 07/15/20 12:56 PM   Specimen: Nasopharyngeal Swab; Nasopharyngeal(NP) swabs in vial transport medium  Result Value Ref Range Status   SARS Coronavirus 2 by RT PCR NEGATIVE NEGATIVE Final    Comment: (NOTE) SARS-CoV-2 target nucleic acids are NOT DETECTED.  The SARS-CoV-2 RNA is generally detectable in upper respiratory specimens during the acute phase of infection. The lowest concentration of SARS-CoV-2 viral copies this assay can detect is 138 copies/mL. A negative result does not preclude SARS-Cov-2 infection and should not be used as the sole basis for treatment or other patient management decisions. A negative result may occur with  improper specimen collection/handling, submission of specimen other than nasopharyngeal swab, presence of viral mutation(s) within the areas targeted by this assay, and inadequate number of viral copies(<138 copies/mL). A  negative result must be combined with clinical observations, patient history, and epidemiological information. The expected result is Negative.  Fact Sheet for Patients:  EntrepreneurPulse.com.au  Fact Sheet for Healthcare Providers:  IncredibleEmployment.be  This test is no t yet approved or cleared by the [place] FDA and  has been authorized for detection and/or diagnosis of SARS-CoV-2 by FDA under an Emergency Use Authorization (EUA). This EUA will remain  in effect (meaning this test can be used) for the duration of the COVID-19 declaration under Section 564(b)(1) of the Act, 21 U.S.C.section 360bbb-3(b)(1), unless the authorization is terminated  or revoked sooner.       Influenza A by PCR NEGATIVE NEGATIVE Final   Influenza B by PCR NEGATIVE NEGATIVE Final    Comment: (NOTE) The Xpert Xpress SARS-CoV-2/FLU/RSV plus assay is intended as an aid in the diagnosis of influenza from Nasopharyngeal swab specimens and should not be used as a sole basis for treatment. Nasal washings and aspirates are unacceptable for Xpert Xpress SARS-CoV-2/FLU/RSV testing.  Fact Sheet for Patients: EntrepreneurPulse.com.au  Fact Sheet for Healthcare Providers: IncredibleEmployment.be  This test is not yet approved or cleared by the [place] FDA and has been authorized for detection and/or diagnosis of SARS-CoV-2 by FDA under an Emergency Use Authorization (EUA). This EUA will remain in effect (meaning this test can be used) for the duration of the COVID-19 declaration under Section 564(b)(1) of the Act, 21 U.S.C. section 360bbb-3(b)(1), unless the authorization is terminated or revoked.  Performed at [facility], [place] [address] [place], [place] [address]   Respiratory Panel by PCR     Status: Abnormal   Collection Time: 07/15/20  4:36 PM   Specimen: Nasopharyngeal Swab; Respiratory  Result Value Ref  Range Status   Adenovirus NOT DETECTED NOT DETECTED Final   Coronavirus 229E NOT DETECTED NOT DETECTED Final    Comment: (NOTE) The Coronavirus on the Respiratory Panel, DOES NOT test for the novel  Coronavirus (2019 nCoV)    Coronavirus HKU1 NOT DETECTED NOT DETECTED Final  Coronavirus NL63 NOT DETECTED NOT DETECTED Final   Coronavirus OC43 NOT DETECTED NOT DETECTED Final   Metapneumovirus NOT DETECTED NOT DETECTED Final   Rhinovirus / Enterovirus DETECTED (A) NOT DETECTED Final   Influenza A NOT DETECTED NOT DETECTED Final   Influenza B NOT DETECTED NOT DETECTED Final   Parainfluenza Virus 1 NOT DETECTED NOT DETECTED Final   Parainfluenza Virus 2 NOT DETECTED NOT DETECTED Final   Parainfluenza Virus 3 NOT DETECTED NOT DETECTED Final   Parainfluenza Virus 4 NOT DETECTED NOT DETECTED Final   Respiratory Syncytial Virus NOT DETECTED NOT DETECTED Final   Bordetella pertussis NOT DETECTED NOT DETECTED Final   Bordetella Parapertussis NOT DETECTED NOT DETECTED Final   Chlamydophila pneumoniae NOT DETECTED NOT DETECTED Final   Mycoplasma pneumoniae NOT DETECTED NOT DETECTED Final    Comment: Performed at [facility], [place] [address], [place], [place] [address]  Culture, blood (routine x 2) Call MD if unable to obtain prior to antibiotics being given     Status: None (Preliminary result)   Collection Time: 07/15/20  5:12 PM   Specimen: BLOOD  Result Value Ref Range Status   Specimen Description BLOOD RIGHT ANTECUBITAL  Final   Special Requests   Final    BOTTLES DRAWN AEROBIC AND ANAEROBIC Blood Culture adequate volume   Culture   Final    NO GROWTH 2 DAYS Performed at [facility], [place] [address], [place], [place] [address]    Report Status PENDING  Incomplete  Culture, blood (routine x 2) Call MD if unable to obtain prior to antibiotics being given     Status: None (Preliminary result)   Collection Time: 07/15/20  6:41 PM   Specimen: BLOOD LEFT HAND  Result Value Ref  Range Status   Specimen Description BLOOD LEFT HAND  Final   Special Requests   Final    BOTTLES DRAWN AEROBIC AND ANAEROBIC Blood Culture adequate volume   Culture   Final    NO GROWTH 2 DAYS Performed at [facility], [place] [address] [place], [place] [address]    Report Status PENDING  Incomplete      Radiology Studies: ECHOCARDIOGRAM COMPLETE  Result Date: 07/16/2020    ECHOCARDIOGRAM REPORT   Patient Name:   ANDREE GOLPHIN Date of Exam: 07/16/2020 Medical Rec #:  [id number]  Height:       67.0 in Accession #:    [id number] Weight:       179.2 lb Date of Birth:  1927-01-09   BSA:          1.930 m Patient Age:    73 years   BP:           94/56 mmHg Patient Gender: M          HR:           71 bpm. Exam Location:  Inpatient Procedure: 2D Echo, Cardiac Doppler and Color Doppler Indications:    I50.33 Acute on chronic diastolic (congestive) heart failure  History:        Patient has no prior history of Echocardiogram examinations.                 COPD; Arrythmias:Atrial Fibrillation.  Sonographer:    Jonelle Sidle Dance Referring Phys: [place]  1. Left ventricular ejection fraction, by estimation, is 55 to 60%. The left ventricle has normal function. The left ventricle has no regional wall motion abnormalities. There is moderate left ventricular hypertrophy.  Left ventricular diastolic parameters are indeterminate.  2. Right ventricular systolic function is moderately reduced. The right ventricular size is mildly enlarged. There is mildly elevated pulmonary artery systolic pressure.  3. Left atrial size was moderately dilated.  4. Mild functional mitral stenosis due to severe MAC. The mitral valve is degenerative. Mild mitral valve regurgitation. Mild mitral stenosis. Severe mitral annular calcification.  5. Tricuspid valve regurgitation is moderate.  6. The aortic valve is tricuspid. There is severe calcifcation of the aortic valve. There is severe thickening of the aortic valve.  Aortic valve regurgitation is not visualized. Severe aortic valve stenosis.  7. The inferior vena cava is normal in size with greater than 50% respiratory variability, suggesting right atrial pressure of 3 mmHg. FINDINGS  Left Ventricle: Left ventricular ejection fraction, by estimation, is 55 to 60%. The left ventricle has normal function. The left ventricle has no regional wall motion abnormalities. The left ventricular internal cavity size was normal in size. There is  moderate left ventricular hypertrophy. Left ventricular diastolic parameters are indeterminate. Right Ventricle: The right ventricular size is mildly enlarged. No increase in right ventricular wall thickness. Right ventricular systolic function is moderately reduced. There is mildly elevated pulmonary artery systolic pressure. The tricuspid regurgitant velocity is 3.08 m/s, and with an assumed right atrial pressure of 3 mmHg, the estimated right ventricular systolic pressure is 38.1 mmHg. Left Atrium: Left atrial size was moderately dilated. Right Atrium: Right atrial size was normal in size. Pericardium: There is no evidence of pericardial effusion. Mitral Valve: Mild functional mitral stenosis due to severe MAC. The mitral valve is degenerative in appearance. There is moderate thickening of the mitral valve leaflet(s). There is moderate calcification of the mitral valve leaflet(s). Severe mitral annular calcification. Mild mitral valve regurgitation. Mild mitral valve stenosis. MV peak gradient, 14.2 mmHg. The mean mitral valve gradient is 4.5 mmHg. Tricuspid Valve: The tricuspid valve is normal in structure. Tricuspid valve regurgitation is moderate . No evidence of tricuspid stenosis. Aortic Valve: The aortic valve is tricuspid. There is severe calcifcation of the aortic valve. There is severe thickening of the aortic valve. There is severe aortic valve annular calcification. Aortic valve regurgitation is not visualized. Severe aortic  stenosis  is present. Aortic valve mean gradient measures 37.0 mmHg. Aortic valve peak gradient measures 63.4 mmHg. Aortic valve area, by VTI measures 0.83 cm. Pulmonic Valve: The pulmonic valve was normal in structure. Pulmonic valve regurgitation is mild. No evidence of pulmonic stenosis. Aorta: The aortic root is normal in size and structure. Venous: The inferior vena cava is normal in size with greater than 50% respiratory variability, suggesting right atrial pressure of 3 mmHg. IAS/Shunts: No atrial level shunt detected by color flow Doppler.  LEFT VENTRICLE PLAX 2D LVIDd:         5.10 cm LVIDs:         3.10 cm LV PW:         1.40 cm LV IVS:        1.40 cm LVOT diam:     2.40 cm LV SV:         82 LV SV Index:   42 LVOT Area:     4.52 cm  RIGHT VENTRICLE            IVC RV Basal diam:  3.50 cm    IVC diam: 1.60 cm RV Mid diam:    3.00 cm RV S prime:     8.59 cm/s TAPSE (M-mode): 1.3 cm  LEFT ATRIUM              Index        RIGHT ATRIUM           Index LA diam:        5.60 cm  2.90 cm/m   RA Area:     35.60 cm LA Vol (A2C):   252.0 ml 130.56 ml/m RA Volume:   135.00 ml 69.94 ml/m LA Vol (A4C):   185.0 ml 95.85 ml/m LA Biplane Vol: 223.0 ml 115.53 ml/m  AORTIC VALVE AV Area (Vmax):    0.87 cm AV Area (Vmean):   0.84 cm AV Area (VTI):     0.83 cm AV Vmax:           398.25 cm/s AV Vmean:          284.750 cm/s AV VTI:            0.980 m AV Peak Grad:      63.4 mmHg AV Mean Grad:      37.0 mmHg LVOT Vmax:         76.75 cm/s LVOT Vmean:        52.700 cm/s LVOT VTI:          0.180 m LVOT/AV VTI ratio: 0.18  AORTA Ao Root diam: 3.60 cm Ao Asc diam:  3.50 cm MITRAL VALVE                TRICUSPID VALVE MV Area (PHT): 1.67 cm     TR Peak grad:   37.9 mmHg MV Peak grad:  14.2 mmHg    TR Vmax:        308.00 cm/s MV Mean grad:  4.5 mmHg MV Vmax:       1.88 m/s     SHUNTS MV Vmean:      89.2 cm/s    Systemic VTI:  0.18 m MV Decel Time: 454 msec     Systemic Diam: 2.40 cm MV E velocity: 181.00 cm/s MV A velocity: 58.20 cm/s MV  E/A ratio:  3.11 Jenkins Rouge MD Electronically signed by Jenkins Rouge MD Signature Date/Time: 07/16/2020/3:21:03 PM    Final        LOS: 3 days   [facility] Pager on [email or web address]  07/18/2020, 9:59 AM

## 2020-07-19 LAB — BASIC METABOLIC PANEL
Anion gap: 9 (ref 5–15)
BUN: 34 mg/dL — ABNORMAL HIGH (ref 8–23)
CO2: 35 mmol/L — ABNORMAL HIGH (ref 22–32)
Calcium: 8.8 mg/dL — ABNORMAL LOW (ref 8.9–10.3)
Chloride: 98 mmol/L (ref 98–111)
Creatinine, Ser: 0.93 mg/dL (ref 0.61–1.24)
GFR, Estimated: >60 mL/min (ref >60)
Glucose, Bld: 108 mg/dL — ABNORMAL HIGH (ref 70–99)
Potassium: 4.3 mmol/L (ref 3.5–5.1)
Sodium: 142 mmol/L (ref 135–145)

## 2020-07-19 LAB — CBC
HCT: 43.6 % (ref 39.0–52.0)
Hemoglobin: 13.7 g/dL (ref 13.0–17.0)
MCH: 31.7 pg (ref 26.0–34.0)
MCHC: 31.4 g/dL (ref 30.0–36.0)
MCV: 100.9 fL — ABNORMAL HIGH (ref 80.0–100.0)
Platelets: 237 10*3/uL (ref 150–400)
RBC: 4.32 MIL/uL (ref 4.22–5.81)
RDW: 15.1 % (ref 11.5–15.5)
WBC: 8.7 10*3/uL (ref 4.0–10.5)
nRBC: 0 % (ref 0.0–0.2)

## 2020-07-19 LAB — PROTIME-INR
INR: 2.8 — ABNORMAL HIGH (ref 0.8–1.2)
Prothrombin Time: 28.7 seconds — ABNORMAL HIGH (ref 11.4–15.2)

## 2020-07-19 MED ORDER — PREDNISONE 20 MG PO TABS: ORAL_TABLET | ORAL | 0 refills | Status: DC

## 2020-07-19 MED ORDER — TORSEMIDE 20 MG PO TABS: 20.0000 mg | ORAL_TABLET | Freq: Every day | ORAL | 2 refills | Status: DC

## 2020-07-19 MED ORDER — POLYETHYLENE GLYCOL 3350 17 G PO PACK
17.0000 g | PACK | Freq: Every day | ORAL | 0 refills | Status: AC | PRN
Start: 2020-07-19 — End: ?

## 2020-07-19 MED ORDER — DOCUSATE SODIUM 100 MG PO CAPS: 100.0000 mg | ORAL_CAPSULE | Freq: Two times a day (BID) | ORAL | 0 refills | Status: DC

## 2020-07-19 MED ORDER — TORSEMIDE 20 MG PO TABS
20.0000 mg | ORAL_TABLET | Freq: Every day | ORAL | Status: DC
  Administered 2020-07-19: 20 mg via ORAL
  Filled 2020-07-19: qty 1

## 2020-07-19 MED ORDER — WARFARIN SODIUM 2 MG PO TABS
4.0000 mg | ORAL_TABLET | Freq: Once | ORAL | Status: AC
  Administered 2020-07-19: 4 mg via ORAL
  Filled 2020-07-19: qty 2

## 2020-07-19 NOTE — Progress Notes (Signed)
SATURATION QUALIFICATIONS: (This note is used to comply with regulatory documentation for home oxygen)  Patient Saturations on Room Air at Rest = 94%  Patient Saturations on Room Air while Ambulating = 87%  O2 Sats on RA post ambulation at rest =94%

## 2020-07-19 NOTE — Progress Notes (Signed)
Cardiology Progress Note  Patient ID: Ruben [name] MRN: [id number] DOB: [birth year] Date of Encounter: 07/19/2020  Primary Cardiologist: No primary care provider on file.  Subjective   Chief Complaint: Shortness of breath has improved.  HPI: Good urine output.  Breathing has improved.  ROS:  All other ROS reviewed and negative. Pertinent positives noted in the HPI.     Inpatient Medications  Scheduled Meds: . azithromycin  500 mg Oral Daily  . docusate sodium  100 mg Oral BID  . dutasteride  0.5 mg Oral Daily  . metoprolol succinate  50 mg Oral Daily  . montelukast  10 mg Oral Daily  . potassium chloride  40 mEq Oral BID  . predniSONE  40 mg Oral Q breakfast  . tamsulosin  0.8 mg Oral QHS  . torsemide  20 mg Oral Daily  . warfarin  4 mg Oral ONCE-1600  . Warfarin - Pharmacist Dosing Inpatient   Does not apply q1600   Continuous Infusions: . sodium chloride 1,000 mL (07/15/20 1854)   PRN Meds: sodium chloride, acetaminophen **OR** acetaminophen, ALPRAZolam, bisacodyl, guaiFENesin-dextromethorphan, hydrALAZINE, HYDROcodone-acetaminophen, ipratropium, levalbuterol, morphine injection, ondansetron **OR** ondansetron (ZOFRAN) IV, polyethylene glycol, polyvinyl alcohol, tiZANidine   Vital Signs   Vitals:   07/18/20 0652 07/18/20 0801 07/18/20 1924 07/19/20 0324  BP: 138/87 131/71 120/75 102/69  Pulse: 65 74 (!) 59 (!) 58  Resp: _0 Temp: 98 F (36.7 C) 97.8 F (36.6 C) 97.9 F (36.6 C) 97.9 F (36.6 C)  TempSrc: Oral Oral Oral Oral  SpO2: 98% 96% 98% 98%  Weight: 80.4 kg   79.6 kg  Height:        Intake/Output Summary (Last 24 hours) at 07/19/2020 0745 Last data filed at 07/19/2020 0329 Gross per 24 hour  Intake 1440 ml  Output 4275 ml  Net -2835 ml   Last 3 Weights 07/19/2020 07/18/2020 07/17/2020  Weight (lbs) 175 lb 6.4 oz 177 lb 3.2 oz 180 lb 8 oz  Weight (kg) 79.561 kg 80.377 kg 81.874 kg      Telemetry  Overnight telemetry shows atrial  fibrillation with heart rate in 70s, which I personally reviewed.   ECG  The most recent ECG shows atrial fibrillation, heart rate 74, right bundle branch block, which I personally reviewed.   Physical Exam   Vitals:   07/18/20 0652 07/18/20 0801 07/18/20 1924 07/19/20 0324  BP: 138/87 131/71 120/75 102/69  Pulse: 65 74 (!) 59 (!) 58  Resp: _1 Temp: 98 F (36.7 C) 97.8 F (36.6 C) 97.9 F (36.6 C) 97.9 F (36.6 C)  TempSrc: Oral Oral Oral Oral  SpO2: 98% 96% 98% 98%  Weight: 80.4 kg   79.6 kg  Height:         Intake/Output Summary (Last 24 hours) at 07/19/2020 0745 Last data filed at 07/19/2020 0329 Gross per 24 hour  Intake 1440 ml  Output 4275 ml  Net -2835 ml    Last 3 Weights 07/19/2020 07/18/2020 07/17/2020  Weight (lbs) 175 lb 6.4 oz 177 lb 3.2 oz 180 lb 8 oz  Weight (kg) 79.561 kg 80.377 kg 81.874 kg    Body mass index is 27.47 kg/m.  General: Well nourished, well developed, in no acute distress Head: Atraumatic, normal size  Eyes: PEERLA, EOMI  Neck: Supple, no JVD Endocrine: No thryomegaly Cardiac: Normal S1, S2; irregular rhythm, harsh 3 out of 6 holosystolic murmur, also late peaking systolic murmur best heard at the right  upper sternal border Lungs: Diminished breath sounds bilaterally, wheezing bilaterally Abd: Soft, nontender, no hepatomegaly  Ext: No edema, pulses 2+ Musculoskeletal: No deformities, BUE and BLE strength normal and equal Skin: Warm and dry, no rashes   Neuro: Alert and oriented to person, place, time, and situation, CNII-XII grossly intact, no focal deficits  Psych: Normal mood and affect   Labs  High Sensitivity Troponin:  No results for input(s): TROPONINIHS in the last 720 hours.   Cardiac EnzymesNo results for input(s): TROPONINI in the last 168 hours. No results for input(s): TROPIPOC in the last 168 hours.  Chemistry Recent Labs  Lab 07/15/20 1256 07/16/20 0259 07/17/20 0442 07/18/20 0642 07/19/20 0414  NA 137    < > 138 142 142  K 3.1*   < > 3.4* 3.8 4.3  CL 93*   < > 97* 98 98  CO2 30   < > 31 34* 35*  GLUCOSE 116*   < > 145* 106* 108*  BUN 23   < > 36* 35* 34*  CREATININE 0.98   < > 0.96 0.96 0.93  CALCIUM 8.8*   < > 8.5* 8.6* 8.8*  PROT 6.9  --   --   --   --   ALBUMIN 3.6  --   --   --   --   AST 20  --   --   --   --   ALT 15  --   --   --   --   ALKPHOS 66  --   --   --   --   BILITOT 1.8*  --   --   --   --   GFRNONAA >60   < > >60 >60 >60  ANIONGAP 14   < > _0 < > = values in this interval not displayed.    Hematology Recent Labs  Lab 07/17/20 0442 07/18/20 0642 07/19/20 0414  WBC 9.3 9.3 8.7  RBC 3.75* 3.94* 4.32  HGB 12.1* 12.7* 13.7  HCT 36.8* 40.0 43.6  MCV 98.1 101.5* 100.9*  MCH 32.3 32.2 31.7  MCHC 32.9 31.8 31.4  RDW 15.6* 15.6* 15.1  PLT 214 232 237   BNP Recent Labs  Lab 07/15/20 1256  BNP 487.9*    DDimer No results for input(s): DDIMER in the last 168 hours.   Radiology  DG CHEST PORT 1 VIEW  Result Date: 07/18/2020 CLINICAL DATA:  Congestive failure EXAM: PORTABLE CHEST 1 VIEW COMPARISON:  07/15/2020 FINDINGS: Cardiac shadow is enlarged. Aortic calcifications are again noted. Mild central vascular prominence is noted without interstitial edema. No focal infiltrate is seen. No bony abnormality is noted. IMPRESSION: Mild congestive changes without interstitial edema. Electronically Signed   By: Inez Catalina M.D.   On: 07/18/2020 13:51    Cardiac Studies  TTE 07/16/2020  1. Left ventricular ejection fraction, by estimation, is 55 to 60%. The  left ventricle has normal function. The left ventricle has no regional  wall motion abnormalities. There is moderate left ventricular hypertrophy.  Left ventricular diastolic  parameters are indeterminate.  2. Right ventricular systolic function is moderately reduced. The right  ventricular size is mildly enlarged. There is mildly elevated pulmonary  artery systolic pressure.  3. Left atrial size  was moderately dilated.  4. Mild functional mitral stenosis due to severe MAC. The mitral valve is  degenerative. Mild mitral valve regurgitation. Mild mitral stenosis.  Severe mitral annular calcification.  5. Tricuspid valve regurgitation  is moderate.  6. The aortic valve is tricuspid. There is severe calcifcation of the  aortic valve. There is severe thickening of the aortic valve. Aortic valve  regurgitation is not visualized. Severe aortic valve stenosis.  7. The inferior vena cava is normal in size with greater than 50%  respiratory variability, suggesting right atrial pressure of 3 mmHg.   Patient Profile  Ruben [name] is a 86 y.o. male with permanent atrial fibrillation, COPD who was admitted on 07/15/2020 for acute decompensated diastolic heart failure, severe aortic stenosis with rhinovirus pneumonia.  Assessment & Plan   1. Severe AS/Diastolic HF/Mitral stenosis/Severe MR -Echo shows moderate to severe AS.  He has at least moderate mitral stenosis.  Calculated mitral valve area is 1.8 cm.  Mean gradient was 4.8 mmHg at a heart rate of 66.  He has significant splay artifact.  I have concerned that he has severe MR.  He has at least 2 murmurs on examination.  I think he will need a transesophageal echo to clarify his mitral valve.  He will then need to undergo outpatient evaluation for TAVR versus surgical repair/replacement.  Unclear if he will be a candidate for either. -Nonetheless, he is euvolemic.  Stop IV Lasix.  Transition to torsemide 20 mg a day.  We will arrange follow-up in the outpatient clinic in 2 to 3 weeks.  2. Permanent Afib -continue coumadin. Rate control.   [facility] will sign off.   Medication Recommendations: Torsemide 20 mg a day (would discontinue home metolazone), metoprolol succinate 50 mg a day, Coumadin with INR goal 2-3 Other recommendations (labs, testing, etc): None Follow up as an outpatient: We will arrange hospital follow-up in 2 to 3 weeks  in the structural heart clinic   For questions or updates, please contact [facility] Please consult [email or web address] for contact info under   Time Spent with Patient: I have spent a total of 25 minutes with patient reviewing hospital notes, telemetry, EKGs, labs and examining the patient as well as establishing an assessment and plan that was discussed with the patient.  > 50% of time was spent in direct patient care.    Signed, Addison Naegeli. Audie Box, [place]  07/19/2020 7:45 AM

## 2020-07-19 NOTE — Discharge Instructions (Signed)
Heart Failure, Self Care Heart failure is a serious condition. This sheet explains things you need to do to take care of yourself at home. To help you stay as healthy as possible, you may be asked to change your diet, take certain medicines, and make other changes in your life. Your doctor may also give you more specific instructions. If you have problems or questions, call your doctor. What are the risks? Having heart failure makes it more likely for you to have some problems. These problems can get worse if you do not take good care of yourself. Problems may include:  Blood clotting problems. This may cause a stroke.  Damage to the kidneys, liver, or lungs.  Abnormal heart rhythms. Supplies needed:  Scale for weighing yourself.  Blood pressure monitor.  Notebook.  Medicines. How to care for yourself when you have heart failure Medicines Take over-the-counter and prescription medicines only as told by your doctor. Take your medicines every day.  Do not stop taking your medicine unless your doctor tells you to do so.  Do not skip any medicines.  Get your prescriptions refilled before you run out of medicine. This is important. Eating and drinking   Eat heart-healthy foods. Talk with a diet specialist (dietitian) to create an eating plan.  Choose foods that: ? Have no trans fat. ? Are low in saturated fat and cholesterol.  Choose healthy foods, such as: ? Fresh or frozen fruits and vegetables. ? Fish. ? Low-fat (lean) meats. ? Legumes, such as beans, peas, and lentils. ? Fat-free or low-fat dairy products. ? Whole-grain foods. ? High-fiber foods.  Limit salt (sodium) if told by your doctor. Ask your diet specialist to tell you which seasonings are healthy for your heart.  Cook in healthy ways instead of frying. Healthy ways of cooking include roasting, grilling, broiling, baking, poaching, steaming, and stir-frying.  Limit how much fluid you drink, if told by your  doctor. Alcohol use  Do not drink alcohol if: ? Your doctor tells you not to drink. ? Your heart was damaged by alcohol, or you have very bad heart failure. ? You are pregnant, may be pregnant, or are planning to become pregnant.  If you drink alcohol: ? Limit how much you use to:  0-1 drink a day for women.  0-2 drinks a day for men. ? Be aware of how much alcohol is in your drink. In the [place] one drink equals one 12 oz bottle of beer (355 mL), one 5 oz glass of wine (148 mL), or one 1 oz glass of hard liquor (44 mL). Lifestyle   Do not use any products that contain nicotine or tobacco, such as cigarettes, e-cigarettes, and chewing tobacco. If you need help quitting, ask your doctor. ? Do not use nicotine gum or patches before talking to your doctor.  Do not use illegal drugs.  Lose weight if told by your doctor.  Do physical activity if told by your doctor. Talk to your doctor before you begin an exercise if: ? You are an older adult. ? You have very bad heart failure.  Learn to manage stress. If you need help, ask your doctor.  Get rehab (rehabilitation) to help you stay independent and to help with your quality of life.  Plan time to rest when you get tired. Check weight and blood pressure   Weigh yourself every day. This will help you to know if fluid is building up in your body. ? Weigh yourself every morning   after you pee (urinate) and before you eat breakfast. ? Wear the same amount of clothing each time. ? Write down your daily weight. Give your record to your doctor.  Check and write down your blood pressure as told by your doctor.  Check your pulse as told by your doctor. Dealing with very hot and very cold weather  If it is very hot: ? Avoid activities that take a lot of energy. ? Use air conditioning or fans, or find a cooler place. ? Avoid caffeine and alcohol. ? Wear clothing that is loose-fitting, lightweight, and light-colored.  If it is very  cold: ? Avoid activities that take a lot of energy. ? Layer your clothes. ? Wear mittens or gloves, a hat, and a scarf when you go outside. ? Avoid alcohol. Follow these instructions at home:  Stay up to date with shots (vaccines). Get pneumococcal and flu (influenza) shots.  Keep all follow-up visits as told by your doctor. This is important. Contact a doctor if:  You gain weight quickly.  You have increasing shortness of breath.  You cannot do your normal activities.  You get tired easily.  You cough a lot.  You don't feel like eating or feel like you may vomit (nauseous).  You become puffy (swell) in your hands, feet, ankles, or belly (abdomen).  You cannot sleep well because it is hard to breathe.  You feel like your heart is beating fast (palpitations).  You get dizzy when you stand up. Get help right away if:  You have trouble breathing.  You or someone else notices a change in your behavior, such as having trouble staying awake.  You have chest pain or discomfort.  You pass out (faint). These symptoms may be an emergency. Do not wait to see if the symptoms will go away. Get medical help right away. Call your local emergency services (911 in the [place] Do not drive yourself to the hospital. Summary  Heart failure is a serious condition. To care for yourself, you may have to change your diet, take medicines, and make other lifestyle changes.  Take your medicines every day. Do not stop taking them unless your doctor tells you to do so.  Eat heart-healthy foods, such as fresh or frozen fruits and vegetables, fish, lean meats, legumes, fat-free or low-fat dairy products, and whole-grain or high-fiber foods.  Ask your doctor if you can drink alcohol. You may have to stop alcohol use if you have very bad heart failure.  Contact your doctor if you gain weight quickly or feel that your heart is beating too fast. Get help right away if you pass out, or have chest pain  or trouble breathing. This information is not intended to replace advice given to you by your health care provider. Make sure you discuss any questions you have with your health care provider. Document Revised: 11/04/2018 Document Reviewed: 11/05/2018 Elsevier Patient Education  2020 [employer]   Aortic Valve Stenosis  Aortic valve stenosis is a narrowing of the aortic valve in the heart. The aortic valve opens and closes to regulate blood flow between the left side of the heart (left ventricle) and the artery that leads away from the heart (aorta). When the aortic valve becomes narrow, it is difficult for the heart to pump blood out to the body, which causes the heart to work harder. The extra work can weaken the heart muscle over time. Aortic valve stenosis can range from mild to severe. If it is  not treated, it can become more severe over time and lead to heart failure. What are the causes? This condition may be caused by:  Buildup of calcium around and on the aortic valve. This can occur with aging. This is the most common cause of aortic valve stenosis.  A heart problem that developed in the womb (birth defect).  Rheumatic fever.  Radiation to the chest. What increases the risk? You may be more likely to develop this condition if:  You are older than age 46.  You were born with an abnormal bicuspid valve. What are the signs or symptoms? You may not have any symptoms until your condition becomes severe. It may take 10-20 years for mild or moderate aortic valve stenosis to become severe. Symptoms may include:  Shortness of breath. This may get worse during physical activity.  Feeling unusually weak and tired (fatigue).  Extreme discomfort in the chest, neck, or arm during physical activity (angina).  A heartbeat that is irregular or faster than normal (palpitations).  Dizziness or fainting. This may happen when you get physically tired or after you take certain heart  medicines, such as nitroglycerin. How is this diagnosed? This condition may be diagnosed with:  A physical exam.  Echocardiogram. This is a type of imaging test that uses sound waves (ultrasound) to make images of your heart. There are two kinds of this test that may be used. ? Transthoracic echocardiogram (TTE). For this type, a wand-like tool (transducer) is moved over your chest to create ultrasound images that are recorded by a computer. ? Transesophageal echocardiogram (TEE). For this type, a flexible tube (probe) is inserted down the part of the body that moves food from your mouth to your stomach (esophagus). The heart and the esophagus are close to each other. Your health care provider will use the probe to take clear, detailed pictures of the heart.  Cardiac catheterization. For this procedure, a small, thin tube (catheter) is passed through a large vein in your neck, groin, or arm. The catheter is used to get information about arteries, structures, blood pressure, and oxygen levels in your heart.  Stress tests. These are tests that evaluate the blood supply to your heart and your heart's response to exercise. You may work with a health care provider who specializes in the heart (cardiologist) for diagnosis and treatment. How is this treated? Treatment depends on how severe your condition is and what your symptoms are. You will need to have your heart checked regularly to make sure that your condition is not getting worse or causing serious problems. Treatment may also include:  Surgery to replace your aortic valve. This is the most common treatment for aortic valve stenosis, and it is the only treatment to cure the condition. Several types of surgeries are available. The surgery may be done: ? Through a large incision over your heart (open-heart surgery). ? Through small incisions, using a flexible tube called a catheter (transcatheter aortic valve replacement, TAVR).  Medicines that  help to keep your heart rate regular.  Medicines that thin your blood (anticoagulants) to prevent blood clots.  Antibiotic medicines to help prevent infection. If your condition is mild, you may only need regular follow-up visits for monitoring. Follow these instructions at home: Lifestyle  Limit alcohol intake to no more than 1 drink a day for nonpregnant women and 2 drinks a day for men. One drink equals 12 oz of beer, 5 oz of wine, or 1 oz of hard liquor.  Do not use any products that contain nicotine or tobacco, such as cigarettes and e-cigarettes. If you need help quitting, ask your health care provider.  Work with your health care provider to manage your blood pressure and cholesterol.  Maintain a healthy weight. Eating and drinking   Eat a heart-healthy diet that includes plenty of fresh fruits and vegetables, whole grains, lean protein, and low-fat or nonfat dairy.  Limit how much caffeine you drink. Caffeine can affect your heart's rate and rhythm.  Avoid foods that are: ? High in salt (sodium), saturated fat, or sugar. ? Canned or highly processed. ? Fried.  Follow instructions from your health care provider about any other eating or drinking restrictions. Activity  Exercise regularly and return to your normal activities as told by your health care provider. Ask your health care provider what amount and type of physical activity is safe for you. ? If your aortic valve stenosis is mild, you may only need to avoid very intense physical activity, such as heavy weight lifting. ? The more severe your aortic valve stenosis is, the more activities you may need to avoid. If you are taking blood thinners:  Before you take any medicines that contain aspirin or NSAIDs, talk with your health care provider. These medicines increase your risk for dangerous bleeding.  Take your medicine exactly as told, at the same time every day.  Avoid activities that could cause injury or  bruising, and follow instructions about how to prevent falls.  Wear a medical alert bracelet or carry a card that lists what medicines you take. General instructions  Take over-the-counter and prescription medicines only as told by your health care provider.  If you were prescribed an antibiotic, take it as told by your health care provider. Do not stop taking the antibiotic even if you start to feel better.  If you are a woman and you plan to become pregnant, talk with your health care provider before you become pregnant.  Before you have any type of medical or dental procedure or surgery, tell all health care providers that you have aortic valve stenosis. This may affect the treatment that you receive.  Keep all follow-up visits as told by your health care provider. This is important. Contact a health care provider if:  You have a fever. Get help right away if:  You develop any of the following symptoms: ? Chest pain. ? Chest tightness. ? Shortness of breath. ? Trouble breathing.  You feel light-headed.  You feel like you might faint.  Your heartbeat is irregular or faster than normal. These symptoms may represent a serious problem that is an emergency. Do not wait to see if the symptoms will go away. Get medical help right away. Call your local emergency services (911 in the [place] Do not drive yourself to the hospital. Summary  Aortic valve stenosis is a narrowing of the aortic valve in the heart. The aortic valve opens and closes to regulate blood flow between the left side of the heart (left ventricle) and the artery that leads away from the heart (aorta).  Aortic valve stenosis can range from mild to severe. If it is not treated, it can become more severe over time and lead to heart failure.  Treatment depends on how severe your condition is and what your symptoms are. You will need to have your heart checked regularly to make sure that your condition is not getting worse  or causing serious problems.  Exercise regularly and return  to your normal activities as told by your health care provider. Ask your health care provider what amount and type of physical activity is safe for you. This information is not intended to replace advice given to you by your health care provider. Make sure you discuss any questions you have with your health care provider. Document Revised: 07/05/2017 Document Reviewed: 04/25/2017 Elsevier Patient Education  2020 [employer]

## 2020-07-19 NOTE — Discharge Summary (Signed)
[facility]  Physician Discharge Summary   Patient ID: Ruben [name] MRN: [id number] DOB/AGE: [birth year] 86 y.o.  Admit date: 07/15/2020 Discharge date: 07/19/2020  PCP: Alroy Dust, L.Marlou Sa, MD  DISCHARGE DIAGNOSES:  COPD with acute exacerbation Rhinovirus infection Acute respiratory failure with hypoxia Acute on chronic diastolic CHF Severe aortic stenosis Chronic atrial fibrillation Normocytic anemia Essential hypertension History of BPH  RECOMMENDATIONS FOR OUTPATIENT FOLLOW UP: 1. Cardiology to arrange outpatient follow-up for aortic stenosis 2. PT/INR to be checked as before 3. Home oxygen to be arranged    Home Health: None Equipment/Devices: Home O2  CODE STATUS: DNR  DISCHARGE CONDITION: fair  Diet recommendation: Heart healthy  INITIAL HISTORY: 86 y.o.malewith medical history significant ofCOPD; afib on Coumadin;and unspecifiedCHF presenting with cough, SOB.   Patient was noted to be hypoxic.  He was hospitalized for further management.    Consultations:  [place] COURSE:   Acute respiratory failure with hypoxia/COPD exacerbation/rhinovirus His symptoms likely triggered by rhinovirus infection.  Patient was also noted to be wheezing at the time of admission.  He was given nebulizer treatment and steroids.  Started on azithromycin for purulent expectoration.  Patient was slow to improve.  He seems to be close to his baseline now.  Acute CHF also contributed to his symptoms. He was ambulated today with mild decrease in his O2 saturations but less than 89%.  He will need home oxygen at home mainly with exertion.  X-ray was repeated and does not show any new changes.  Acute on chronic diastolic CHF Patient with history of CHF thought to be diastolic.    Patient was noted to be volume overloaded.  Was given IV Lasix for couple of days.  He diuresed well.  Echocardiogram shows normal systolic function.  However severe aortic  stenosis was noted.  See below. Changed to torsemide at discharge.  Cardiology will arrange outpatient follow-up.  Severe aortic stenosis Noted on echocardiogram.  Patient mentions that he has been told by his cardiologist in [place] that he had aortic stenosis but he was not sure about the severity.  Discussed also with patient's son who works in the lab at [employer].   Cardiology was consulted.  They are considering him for TAVR.  He will need further work-up.  However this will have to wait till he is over his acute illness.    This will be done in the outpatient setting.  Hypokalemia Repleted.  Magnesium was 2.1 yesterday.     History of chronic atrial fibrillation Rate is controlled with metoprolol.    Continue warfarin.  PT/INR check in the outpatient setting as before.  Normocytic anemia No evidence of overt bleeding.   Essential hypertension Blood pressure noted to be stable.   History of BPH Continue Flomax and Avodart.  Chronic pain Noted to be on Norco and tizanidine.  Overall stable.  Okay for discharge home today.  Discussed with patient and his son.  PERTINENT LABS:  The results of significant diagnostics from this hospitalization (including imaging, microbiology, ancillary and laboratory) are listed below for reference.    Microbiology: Recent Results (from the past 240 hour(s))  Resp Panel by RT-PCR (Flu A&B, Covid) Nasopharyngeal Swab     Status: None   Collection Time: 07/15/20 12:56 PM   Specimen: Nasopharyngeal Swab; Nasopharyngeal(NP) swabs in vial transport medium  Result Value Ref Range Status   SARS Coronavirus 2 by RT PCR NEGATIVE NEGATIVE Final    Comment: (NOTE) SARS-CoV-2 target nucleic acids  are NOT DETECTED.  The SARS-CoV-2 RNA is generally detectable in upper respiratory specimens during the acute phase of infection. The lowest concentration of SARS-CoV-2 viral copies this assay can detect is 138 copies/mL. A negative result does  not preclude SARS-Cov-2 infection and should not be used as the sole basis for treatment or other patient management decisions. A negative result may occur with  improper specimen collection/handling, submission of specimen other than nasopharyngeal swab, presence of viral mutation(s) within the areas targeted by this assay, and inadequate number of viral copies(<138 copies/mL). A negative result must be combined with clinical observations, patient history, and epidemiological information. The expected result is Negative.  Fact Sheet for Patients:  EntrepreneurPulse.com.au  Fact Sheet for Healthcare Providers:  IncredibleEmployment.be  This test is no t yet approved or cleared by the [place] FDA and  has been authorized for detection and/or diagnosis of SARS-CoV-2 by FDA under an Emergency Use Authorization (EUA). This EUA will remain  in effect (meaning this test can be used) for the duration of the COVID-19 declaration under Section 564(b)(1) of the Act, 21 U.S.C.section 360bbb-3(b)(1), unless the authorization is terminated  or revoked sooner.       Influenza A by PCR NEGATIVE NEGATIVE Final   Influenza B by PCR NEGATIVE NEGATIVE Final    Comment: (NOTE) The Xpert Xpress SARS-CoV-2/FLU/RSV plus assay is intended as an aid in the diagnosis of influenza from Nasopharyngeal swab specimens and should not be used as a sole basis for treatment. Nasal washings and aspirates are unacceptable for Xpert Xpress SARS-CoV-2/FLU/RSV testing.  Fact Sheet for Patients: EntrepreneurPulse.com.au  Fact Sheet for Healthcare Providers: IncredibleEmployment.be  This test is not yet approved or cleared by the [place] FDA and has been authorized for detection and/or diagnosis of SARS-CoV-2 by FDA under an Emergency Use Authorization (EUA). This EUA will remain in effect (meaning this test can be used) for the  duration of the COVID-19 declaration under Section 564(b)(1) of the Act, 21 U.S.C. section 360bbb-3(b)(1), unless the authorization is terminated or revoked.  Performed at [facility], [place] [address] [place], [place] [address]   Respiratory Panel by PCR     Status: Abnormal   Collection Time: 07/15/20  4:36 PM   Specimen: Nasopharyngeal Swab; Respiratory  Result Value Ref Range Status   Adenovirus NOT DETECTED NOT DETECTED Final   Coronavirus 229E NOT DETECTED NOT DETECTED Final    Comment: (NOTE) The Coronavirus on the Respiratory Panel, DOES NOT test for the novel  Coronavirus (2019 nCoV)    Coronavirus HKU1 NOT DETECTED NOT DETECTED Final   Coronavirus NL63 NOT DETECTED NOT DETECTED Final   Coronavirus OC43 NOT DETECTED NOT DETECTED Final   Metapneumovirus NOT DETECTED NOT DETECTED Final   Rhinovirus / Enterovirus DETECTED (A) NOT DETECTED Final   Influenza A NOT DETECTED NOT DETECTED Final   Influenza B NOT DETECTED NOT DETECTED Final   Parainfluenza Virus 1 NOT DETECTED NOT DETECTED Final   Parainfluenza Virus 2 NOT DETECTED NOT DETECTED Final   Parainfluenza Virus 3 NOT DETECTED NOT DETECTED Final   Parainfluenza Virus 4 NOT DETECTED NOT DETECTED Final   Respiratory Syncytial Virus NOT DETECTED NOT DETECTED Final   Bordetella pertussis NOT DETECTED NOT DETECTED Final   Bordetella Parapertussis NOT DETECTED NOT DETECTED Final   Chlamydophila pneumoniae NOT DETECTED NOT DETECTED Final   Mycoplasma pneumoniae NOT DETECTED NOT DETECTED Final    Comment: Performed at [facility], [address] [place],  [place] [address]  Culture, blood (routine x 2) Call MD if unable to obtain prior to antibiotics being given     Status: None (Preliminary result)   Collection Time: 07/15/20  5:12 PM   Specimen: BLOOD  Result Value Ref Range Status   Specimen Description BLOOD RIGHT ANTECUBITAL  Final   Special Requests   Final    BOTTLES DRAWN AEROBIC AND ANAEROBIC Blood  Culture adequate volume   Culture   Final    NO GROWTH 3 DAYS Performed at [facility], [address], [place], [place] [address]    Report Status PENDING  Incomplete  Culture, blood (routine x 2) Call MD if unable to obtain prior to antibiotics being given     Status: None (Preliminary result)   Collection Time: 07/15/20  6:41 PM   Specimen: BLOOD LEFT HAND  Result Value Ref Range Status   Specimen Description BLOOD LEFT HAND  Final   Special Requests   Final    BOTTLES DRAWN AEROBIC AND ANAEROBIC Blood Culture adequate volume   Culture   Final    NO GROWTH 3 DAYS Performed at [facility], [place] [address] [place], [place] [address]    Report Status PENDING  Incomplete     Labs:  COVID-19 Labs   Lab Results  Component Value Date   Xenia NEGATIVE 07/15/2020      Basic Metabolic Panel: Recent Labs  Lab 07/15/20 1256 07/16/20 0259 07/16/20 1316 07/17/20 0442 07/18/20 0642 07/19/20 0414  NA 137 140  --  138 142 142  K 3.1* 2.6* 3.7 3.4* 3.8 4.3  CL 93* 95*  --  97* 98 98  CO2 30 34*  --  31 34* 35*  GLUCOSE 116* 144*  --  145* 106* 108*  BUN 23 23  --  36* 35* 34*  CREATININE 0.98 0.90  --  0.96 0.96 0.93  CALCIUM 8.8* 8.6*  --  8.5* 8.6* 8.8*  MG  --  1.8  --  2.1  --   --    Liver Function Tests: Recent Labs  Lab 07/15/20 1256  AST 20  ALT 15  ALKPHOS 66  BILITOT 1.8*  PROT 6.9  ALBUMIN 3.6   CBC: Recent Labs  Lab 07/15/20 1256 07/16/20 0259 07/17/20 0442 07/18/20 0642 07/19/20 0414  WBC 11.0* 4.5 9.3 9.3 8.7  NEUTROABS 9.4*  --   --   --   --   HGB 13.1 12.7* 12.1* 12.7* 13.7  HCT 40.3 38.5* 36.8* 40.0 43.6  MCV 99.3 97.2 98.1 101.5* 100.9*  PLT 208 198 214 232 237   BNP: BNP (last 3 results) Recent Labs    07/15/20 1256  BNP 487.9*      IMAGING STUDIES DG CHEST PORT 1 VIEW  Result Date: 07/18/2020 CLINICAL DATA:  Congestive failure EXAM: PORTABLE CHEST 1 VIEW COMPARISON:  07/15/2020 FINDINGS: Cardiac  shadow is enlarged. Aortic calcifications are again noted. Mild central vascular prominence is noted without interstitial edema. No focal infiltrate is seen. No bony abnormality is noted. IMPRESSION: Mild congestive changes without interstitial edema. Electronically Signed   By: Inez Catalina M.D.   On: 07/18/2020 13:51   DG Chest Port 1 View  Result Date: 07/15/2020 CLINICAL DATA:  SOB EXAM: PORTABLE CHEST 1 VIEW COMPARISON:  None. FINDINGS: No pneumothorax. Blunting of the left costophrenic sulcus. Patchy left basilar opacities. Cardiomegaly. Emphysematous changes. Multilevel spondylosis. IMPRESSION: Patchy left basilar opacities and trace left effusion, infiltrate versus edema.  Cardiomegaly and emphysema. Electronically Signed   By: Primitivo Gauze M.D.   On: 07/15/2020 13:12   ECHOCARDIOGRAM COMPLETE  Result Date: 07/16/2020    ECHOCARDIOGRAM REPORT   Patient Name:   Ruben [name] Date of Exam: 07/16/2020 Medical Rec #:  [id number]  Height:       67.0 in Accession #:    [id number] Weight:       179.2 lb Date of Birth:  30-Jan-1927   BSA:          1.930 m Patient Age:    69 years   BP:           94/56 mmHg Patient Gender: M          HR:           71 bpm. Exam Location:  Inpatient Procedure: 2D Echo, Cardiac Doppler and Color Doppler Indications:    I50.33 Acute on chronic diastolic (congestive) heart failure  History:        Patient has no prior history of Echocardiogram examinations.                 COPD; Arrythmias:Atrial Fibrillation.  Sonographer:    Jonelle Sidle Dance Referring Phys: [place]  1. Left ventricular ejection fraction, by estimation, is 55 to 60%. The left ventricle has normal function. The left ventricle has no regional wall motion abnormalities. There is moderate left ventricular hypertrophy. Left ventricular diastolic parameters are indeterminate.  2. Right ventricular systolic function is moderately reduced. The right ventricular size is mildly enlarged. There is  mildly elevated pulmonary artery systolic pressure.  3. Left atrial size was moderately dilated.  4. Mild functional mitral stenosis due to severe MAC. The mitral valve is degenerative. Mild mitral valve regurgitation. Mild mitral stenosis. Severe mitral annular calcification.  5. Tricuspid valve regurgitation is moderate.  6. The aortic valve is tricuspid. There is severe calcifcation of the aortic valve. There is severe thickening of the aortic valve. Aortic valve regurgitation is not visualized. Severe aortic valve stenosis.  7. The inferior vena cava is normal in size with greater than 50% respiratory variability, suggesting right atrial pressure of 3 mmHg. FINDINGS  Left Ventricle: Left ventricular ejection fraction, by estimation, is 55 to 60%. The left ventricle has normal function. The left ventricle has no regional wall motion abnormalities. The left ventricular internal cavity size was normal in size. There is  moderate left ventricular hypertrophy. Left ventricular diastolic parameters are indeterminate. Right Ventricle: The right ventricular size is mildly enlarged. No increase in right ventricular wall thickness. Right ventricular systolic function is moderately reduced. There is mildly elevated pulmonary artery systolic pressure. The tricuspid regurgitant velocity is 3.08 m/s, and with an assumed right atrial pressure of 3 mmHg, the estimated right ventricular systolic pressure is 75.9 mmHg. Left Atrium: Left atrial size was moderately dilated. Right Atrium: Right atrial size was normal in size. Pericardium: There is no evidence of pericardial effusion. Mitral Valve: Mild functional mitral stenosis due to severe MAC. The mitral valve is degenerative in appearance. There is moderate thickening of the mitral valve leaflet(s). There is moderate calcification of the mitral valve leaflet(s). Severe mitral annular calcification. Mild mitral valve regurgitation. Mild mitral valve stenosis. MV peak gradient,  14.2 mmHg. The mean mitral valve gradient is 4.5 mmHg. Tricuspid Valve: The tricuspid valve is normal in structure. Tricuspid valve regurgitation is moderate . No evidence of tricuspid stenosis. Aortic Valve: The aortic valve is tricuspid. There is severe calcifcation of the  aortic valve. There is severe thickening of the aortic valve. There is severe aortic valve annular calcification. Aortic valve regurgitation is not visualized. Severe aortic  stenosis is present. Aortic valve mean gradient measures 37.0 mmHg. Aortic valve peak gradient measures 63.4 mmHg. Aortic valve area, by VTI measures 0.83 cm. Pulmonic Valve: The pulmonic valve was normal in structure. Pulmonic valve regurgitation is mild. No evidence of pulmonic stenosis. Aorta: The aortic root is normal in size and structure. Venous: The inferior vena cava is normal in size with greater than 50% respiratory variability, suggesting right atrial pressure of 3 mmHg. IAS/Shunts: No atrial level shunt detected by color flow Doppler.  LEFT VENTRICLE PLAX 2D LVIDd:         5.10 cm LVIDs:         3.10 cm LV PW:         1.40 cm LV IVS:        1.40 cm LVOT diam:     2.40 cm LV SV:         82 LV SV Index:   42 LVOT Area:     4.52 cm  RIGHT VENTRICLE            IVC RV Basal diam:  3.50 cm    IVC diam: 1.60 cm RV Mid diam:    3.00 cm RV S prime:     8.59 cm/s TAPSE (M-mode): 1.3 cm LEFT ATRIUM              Index        RIGHT ATRIUM           Index LA diam:        5.60 cm  2.90 cm/m   RA Area:     35.60 cm LA Vol (A2C):   252.0 ml 130.56 ml/m RA Volume:   135.00 ml 69.94 ml/m LA Vol (A4C):   185.0 ml 95.85 ml/m LA Biplane Vol: 223.0 ml 115.53 ml/m  AORTIC VALVE AV Area (Vmax):    0.87 cm AV Area (Vmean):   0.84 cm AV Area (VTI):     0.83 cm AV Vmax:           398.25 cm/s AV Vmean:          284.750 cm/s AV VTI:            0.980 m AV Peak Grad:      63.4 mmHg AV Mean Grad:      37.0 mmHg LVOT Vmax:         76.75 cm/s LVOT Vmean:        52.700 cm/s LVOT VTI:           0.180 m LVOT/AV VTI ratio: 0.18  AORTA Ao Root diam: 3.60 cm Ao Asc diam:  3.50 cm MITRAL VALVE                TRICUSPID VALVE MV Area (PHT): 1.67 cm     TR Peak grad:   37.9 mmHg MV Peak grad:  14.2 mmHg    TR Vmax:        308.00 cm/s MV Mean grad:  4.5 mmHg MV Vmax:       1.88 m/s     SHUNTS MV Vmean:      89.2 cm/s    Systemic VTI:  0.18 m MV Decel Time: 454 msec     Systemic Diam: 2.40 cm MV E velocity: 181.00 cm/s MV A velocity: 58.20 cm/s MV E/A ratio:  3.11 Jenkins Rouge MD Electronically signed by Jenkins Rouge MD Signature Date/Time: 07/16/2020/3:21:03 PM    Final     DISCHARGE EXAMINATION: Vitals:   07/18/20 1924 07/19/20 0324 07/19/20 0831 07/19/20 1128  BP: 120/75 102/69 102/67 92/75  Pulse: (!) 59 (!) 58 82 78  Resp: _0 Temp: 97.9 F (36.6 C) 97.9 F (36.6 C) 97.8 F (36.6 C) 97.8 F (36.6 C)  TempSrc: Oral Oral Oral Oral  SpO2: 98% 98% 94% 94%  Weight:  79.6 kg    Height:       General appearance: Awake alert.  In no distress Resp: Improved air entry.  Normal effort at rest.  Scattered wheezing but better than before.  Few crackles at the bases also better than before. Cardio: S1-S2 is normal regular.  No S3-S4.  Systolic murmur over the precordium GI: Abdomen is soft.  Nontender nondistended.  Bowel sounds are present normal.  No masses organomegaly    DISPOSITION: Home  Discharge Instructions    (HEART FAILURE PATIENTS) Call MD:  Anytime you have any of the following symptoms: 1) 3 pound weight gain in 24 hours or 5 pounds in 1 week 2) shortness of breath, with or without a dry hacking cough 3) swelling in the hands, feet or stomach 4) if you have to sleep on extra pillows at night in order to breathe.   Complete by: As directed    Call MD for:  difficulty breathing, headache or visual disturbances   Complete by: As directed    Call MD for:  extreme fatigue   Complete by: As directed    Call MD for:  persistant dizziness or light-headedness   Complete  by: As directed    Call MD for:  persistant nausea and vomiting   Complete by: As directed    Call MD for:  severe uncontrolled pain   Complete by: As directed    Call MD for:  temperature >100.4   Complete by: As directed    Diet - low sodium heart healthy   Complete by: As directed    Discharge instructions   Complete by: As directed    Please take your medications as prescribed.  The cardiologist will arrange outpatient follow-up.  Continue to have your primary care provider check your PT/INR.  You were cared for by a hospitalist during your hospital stay. If you have any questions about your discharge medications or the care you received while you were in the hospital after you are discharged, you can call the unit and asked to speak with the hospitalist on call if the hospitalist that took care of you is not available. Once you are discharged, your primary care physician will handle any further medical issues. Please note that NO REFILLS for any discharge medications will be authorized once you are discharged, as it is imperative that you return to your primary care physician (or establish a relationship with a primary care physician if you do not have one) for your aftercare needs so that they can reassess your need for medications and monitor your lab values. If you do not have a primary care physician, you can call [phone] for a physician referral.   Increase activity slowly   Complete by: As directed         Allergies as of 07/19/2020   No Known Allergies     Medication List    STOP taking these medications   furosemide 40 MG tablet Commonly known as:  LASIX   metolazone 2.5 MG tablet Commonly known as: ZAROXOLYN     TAKE these medications   acetaminophen 500 MG tablet Commonly known as: TYLENOL Take 1,000 mg by mouth every 6 (six) hours as needed for headache (pain).   ALPRAZolam 0.25 MG tablet Commonly known as: XANAX Take 0.125 mg by mouth at bedtime as needed  for anxiety or sleep.   docusate sodium 100 MG capsule Commonly known as: COLACE Take 1 capsule (100 mg total) by mouth 2 (two) times daily.   dutasteride 0.5 MG capsule Commonly known as: AVODART Take 0.5 mg by mouth daily.   HYDROcodone-acetaminophen 7.5-325 MG tablet Commonly known as: NORCO Take 1 tablet by mouth 3 (three) times daily as needed (pain).   levalbuterol 0.63 MG/3ML nebulizer solution Commonly known as: XOPENEX Take 0.63 mg by nebulization 2 (two) times daily.   levalbuterol 45 MCG/ACT inhaler Commonly known as: XOPENEX HFA Inhale 2 puffs into the lungs every 4 (four) hours as needed for wheezing or shortness of breath.   metoprolol succinate 50 MG 24 hr tablet Commonly known as: TOPROL-XL Take 50 mg by mouth daily.   montelukast 10 MG tablet Commonly known as: SINGULAIR Take 10 mg by mouth daily.   polyethylene glycol 17 g packet Commonly known as: MIRALAX / GLYCOLAX Take 17 g by mouth daily as needed for mild constipation.   potassium chloride 8 MEQ tablet Commonly known as: KLOR-CON Take 8 mEq by mouth daily.   predniSONE 20 MG tablet Commonly known as: DELTASONE Take 2 tablets once daily for 3 days followed by 1 tablet once daily for 5 days and then stop   Systane Complete 0.6 % Soln Generic drug: Propylene Glycol Place 1 drop into both eyes daily as needed (dry eyes).   tamsulosin 0.4 MG Caps capsule Commonly known as: FLOMAX Take 0.8 mg by mouth at bedtime.   tizanidine 2 MG capsule Commonly known as: ZANAFLEX Take 2 mg by mouth at bedtime as needed for muscle spasms.   torsemide 20 MG tablet Commonly known as: DEMADEX Take 1 tablet (20 mg total) by mouth daily. Start taking on: July 20, 2020   warfarin 6 MG tablet Commonly known as: COUMADIN Take 6 mg by mouth at bedtime.   ZICAM ALLERGY RELIEF NA Place 1 spray into both nostrils 2 (two) times daily as needed (congestion).            Durable Medical Equipment  (From  admission, onward)         Start     Ordered   07/19/20 0927  For home use only DME oxygen  Once       Question Answer Comment  Length of Need 6 Months   Mode or (Route) Nasal cannula   Liters per Minute 2   Frequency Continuous (stationary and portable oxygen unit needed)   Oxygen conserving device Yes   Oxygen delivery system Gas      07/19/20 0926            Follow-up Information    Alroy Dust, L.Marlou Sa, MD. Schedule an appointment as soon as possible for a visit in 1 week(s).   Specialty: Family Medicine Contact information: [address] [place] [address] [phone]        [facility] Follow up.   Why: home oxygen Contact information: [place] [address] [phone]               TOTAL DISCHARGE  TIME: 35 minutes  Dory Demont [employer] on [email or web address]  07/19/2020, 11:30 AM

## 2020-07-19 NOTE — TOC Transition Note (Signed)
Transition of Care [facility]) - CM/SW Discharge Note   Patient Details  Name: Ruben [name] MRN: [id number] Date of Birth: September 21, 1926  [facility] CM[facility]SW Contact:  Leone Haven, RN Phone Number: 07/19/2020, 10:17 AM   Clinical Narrative:    Patient is for dc today, NCM spoke with patient ,he states he is ok with using [employer] for home oxygen.  NCM made referral to [facility] with [facility].  Oxygen tank will be delivered to patient room prior to dc and concentrator will be set up at his home.   Final next level of care: Home/Self Care Barriers to Discharge: No Barriers Identified   Patient Goals and CMS Choice Patient states their goals for this hospitalization and ongoing recovery are:: get better   Choice offered to / list presented to : NA  Discharge Placement                       Discharge Plan and Services In-house Referral: Clinical Social Work Discharge Planning Services: CM Consult            DME Arranged: Oxygen DME Agency: [facility] Date DME Agency Contacted: 07/19/20 Time DME Agency Contacted: [phone] Representative spoke with at DME Agency: Ian Malkin HH Arranged: NA          Social Determinants of Health (SDOH) Interventions     Readmission Risk Interventions No flowsheet data found.

## 2020-07-19 NOTE — Progress Notes (Signed)
ANTICOAGULATION CONSULT NOTE  Pharmacy Consult:  Coumadin Indication: atrial fibrillation  No Known Allergies  Patient Measurements: Height: 5\' 7"  (170.2 cm) Weight: 79.6 kg (175 lb 6.4 oz) IBW/kg (Calculated) : 66.1  Vital Signs: Temp: 97.9 F (36.6 C) (12/14 0324) Temp Source: Oral (12/14 0324) BP: 102/69 (12/14 0324) Pulse Rate: 58 (12/14 0324)  Labs: Recent Labs    07/17/20 0442 07/18/20 0642 07/19/20 0414  HGB 12.1* 12.7* 13.7  HCT 36.8* 40.0 43.6  PLT 214 232 237  LABPROT 31.0* 34.0* 28.7*  INR 3.1* 3.5* 2.8*  CREATININE 0.96 0.96 0.93    Estimated Creatinine Clearance: 52.3 mL/min (by C-G formula based on SCr of 0.93 mg/dL).   Assessment: 45 YOM with history of Afib to continue on Coumadin from PTA.  INR down to therapeutic level at 2.8 today after holding 12/13's dose.  No bleeding reported.  Continues on azithromycin.  Home Coumadin regimen: 6mg  daily  Goal of Therapy:  INR 2-3 Monitor platelets by anticoagulation protocol: Yes   Plan:  Coumadin 4mg  PO today Daily PT / INR  Ruben [name] D. , PharmD, BCPS, BCCCP 07/19/2020, 7:20 AM

## 2020-07-19 NOTE — Plan of Care (Signed)
°  Problem: Education: °Goal: Knowledge of General Education information will improve °Description: Including pain rating scale, medication(s)/side effects and non-pharmacologic comfort measures °Outcome: Adequate for Discharge °  °Problem: Health Behavior/Discharge Planning: °Goal: Ability to manage health-related needs will improve °Outcome: Adequate for Discharge °  °Problem: Clinical Measurements: °Goal: Ability to maintain clinical measurements within normal limits will improve °Outcome: Adequate for Discharge °Goal: Will remain free from infection °Outcome: Adequate for Discharge °Goal: Diagnostic test results will improve °Outcome: Adequate for Discharge °Goal: Respiratory complications will improve °Outcome: Adequate for Discharge °Goal: Cardiovascular complication will be avoided °Outcome: Adequate for Discharge °  °Problem: Activity: °Goal: Risk for activity intolerance will decrease °Outcome: Adequate for Discharge °  °Problem: Nutrition: °Goal: Adequate nutrition will be maintained °Outcome: Adequate for Discharge °  °Problem: Coping: °Goal: Level of anxiety will decrease °Outcome: Adequate for Discharge °  °Problem: Elimination: °Goal: Will not experience complications related to bowel motility °Outcome: Adequate for Discharge °Goal: Will not experience complications related to urinary retention °Outcome: Adequate for Discharge °  °Problem: Pain Managment: °Goal: General experience of comfort will improve °Outcome: Adequate for Discharge °  °Problem: Safety: °Goal: Ability to remain free from injury will improve °Outcome: Adequate for Discharge °  °Problem: Skin Integrity: °Goal: Risk for impaired skin integrity will decrease °Outcome: Adequate for Discharge °  °Problem: Education: °Goal: Ability to demonstrate management of disease process will improve °Outcome: Adequate for Discharge °Goal: Ability to verbalize understanding of medication therapies will improve °Outcome: Adequate for Discharge °Goal:  Individualized Educational Video(s) °Outcome: Adequate for Discharge °  °Problem: Activity: °Goal: Capacity to carry out activities will improve °Outcome: Adequate for Discharge °  °Problem: Cardiac: °Goal: Ability to achieve and maintain adequate cardiopulmonary perfusion will improve °Outcome: Adequate for Discharge °  °Problem: Education: °Goal: Knowledge of disease or condition will improve °Outcome: Adequate for Discharge °Goal: Knowledge of the prescribed therapeutic regimen will improve °Outcome: Adequate for Discharge °Goal: Individualized Educational Video(s) °Outcome: Adequate for Discharge °  °Problem: Activity: °Goal: Ability to tolerate increased activity will improve °Outcome: Adequate for Discharge °Goal: Will verbalize the importance of balancing activity with adequate rest periods °Outcome: Adequate for Discharge °  °Problem: Respiratory: °Goal: Ability to maintain a clear airway will improve °Outcome: Adequate for Discharge °Goal: Levels of oxygenation will improve °Outcome: Adequate for Discharge °Goal: Ability to maintain adequate ventilation will improve °Outcome: Adequate for Discharge °  °

## 2020-07-19 NOTE — Care Management Important Message (Signed)
Important Message  Patient Details  Name: Ruben [name] MRN: [id number] Date of Birth: [birth year]   Medicare Important Message Given:  Yes - Important Message mailed due to current National Emergency  Verbal consent obtained due to current National Emergency  Relationship to patient: Self Contact Name: Johathon Overturf Call Date: 07/19/20  Time: 1253 Phone: [phone] Outcome: No Answer/Busy Important Message mailed to: Patient address on file    Orson Aloe 07/19/2020, 12:53 PM

## 2020-07-19 NOTE — Progress Notes (Signed)
D/C instructions given and reviewed with son, Carsen, over the phone. Questions asked and answered but encouraged to call with any concerns. Son will arrive in approx 30 min to transport home.

## 2020-07-20 LAB — CULTURE, BLOOD (ROUTINE X 2)
Culture: NO GROWTH
Culture: NO GROWTH
Special Requests: ADEQUATE
Special Requests: ADEQUATE

## 2020-08-12 ENCOUNTER — Other Ambulatory Visit: Payer: Self-pay

## 2020-08-12 ENCOUNTER — Encounter: Payer: Self-pay | Admitting: Cardiovascular Disease

## 2020-08-12 ENCOUNTER — Ambulatory Visit (INDEPENDENT_AMBULATORY_CARE_PROVIDER_SITE_OTHER): Payer: Medicare Other | Admitting: Cardiovascular Disease

## 2020-08-12 VITALS — BP 112/64 | HR 79 | Ht 67.0 in | Wt 193.6 lb

## 2020-08-12 DIAGNOSIS — I350 Nonrheumatic aortic (valve) stenosis: Secondary | ICD-10-CM

## 2020-08-12 DIAGNOSIS — I4821 Permanent atrial fibrillation: Secondary | ICD-10-CM | POA: Diagnosis not present

## 2020-08-12 DIAGNOSIS — R0602 Shortness of breath: Secondary | ICD-10-CM | POA: Diagnosis not present

## 2020-08-12 NOTE — Progress Notes (Signed)
Cardiology Office Note:    Date:  08/13/2020   ID:  Ruben [name], DOB Jul 07, 1927, MRN [id number]  PCP:  Ruben [name], Ruben Sa, MD  [place] Cardiologist:  No primary care provider on file.  [facility] Electrophysiologist:  None   Referring MD: Ruben [name], Ruben Sa, MD   Chief Complaint  Patient presents with  . Shortness of Breath    History of Present Illness:    Ruben [name] is a 87 y.o. male presenting for evaluation of severe aortic stenosis, referred by [name].  The patient recently relocated to [facility] from [place] to be closer to his son.  The patient is here with his son, [name], today. 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 The patient's wife has recently suffered a stroke but is now back home and making improvements. They are living with Ruben [name] at the present time.   The patient was hospitalized 12/10-12/14/2021 with rhinovirus infection and COPD exacerbation. He was discharged on home O2 (first time he has evere required O2 at home). He does have COPD and he quit smoking remotely in 1975. He has followed with a cardiologist in [place] before relocating here, and he has known about having aortic stenosis. He has been told he will likely require TAVR at some point.  The patient denies chest pain or pressure.  He has reported mild chronic leg swelling with no recent change.  States that he has a good response to oral diuretic therapy with frequent urination after he takes his morning diuretic pills.  He has experienced worsening shortness of breath this week and was noted to have lower oxygen saturations were requiring use of home oxygen again.  No cough, fever, or chills.  He reports mild orthopnea without PND.  He still drives a car. He is limited somewhat by pain in the shoulders and back that he attributes to arthritis. Otherwise he hasn't had a lot of medical problems. His surgical history includes only bilateral knee replacements and an appendectomy.    Regarding his dental history, he has an upper bridge and reports regular dental care over the years. He doesn't currently have any problems with his teeth or gums.   Past Medical History:  Diagnosis Date  . Arthritis   . CHF (congestive heart failure) ([place])   . COPD (chronic obstructive pulmonary disease) ([place])     Past Surgical History:  Procedure Laterality Date  . TOTAL KNEE ARTHROPLASTY Bilateral     Current Medications: Current Meds  Medication Sig  . acetaminophen (TYLENOL) 500 MG tablet Take 1,000 mg by mouth every 6 (six) hours as needed for headache (pain).  Marland Kitchen ALPRAZolam (XANAX) 0.25 MG tablet Take 0.125 mg by mouth at bedtime as needed for anxiety or sleep.  Marland Kitchen docusate sodium (COLACE) 100 MG capsule Take 1 capsule (100 mg total) by mouth 2 (two) times daily.  Marland Kitchen dutasteride (AVODART) 0.5 MG capsule Take 0.5 mg by mouth daily.  Marland Kitchen HYDROcodone-acetaminophen (NORCO) 7.5-325 MG tablet Take 1 tablet by mouth 3 (three) times daily as needed (pain).  Marland Kitchen levalbuterol (XOPENEX HFA) 45 MCG/ACT inhaler Inhale 2 puffs into the lungs every 4 (four) hours as needed for wheezing or shortness of breath.  . levalbuterol (XOPENEX) 0.63 MG/3ML nebulizer solution Take 0.63 mg by nebulization 2 (two) times daily.  . metoprolol succinate (TOPROL-XL) 50 MG 24 hr tablet Take 50 mg by mouth daily.  . montelukast (SINGULAIR) 10 MG tablet Take 10 mg by mouth daily.  . polyethylene glycol (  MIRALAX / GLYCOLAX) 17 g packet Take 17 g by mouth daily as needed for mild constipation.  . potassium chloride (KLOR-CON) 8 MEQ tablet Take 8 mEq by mouth daily.  Marland Kitchen Propylene Glycol (SYSTANE COMPLETE) 0.6 % SOLN Place 1 drop into both eyes daily as needed (dry eyes).  . tamsulosin (FLOMAX) 0.4 MG CAPS capsule Take 0.8 mg by mouth at bedtime.  . tizanidine (ZANAFLEX) 2 MG capsule Take 2 mg by mouth at bedtime as needed for muscle spasms.  Marland Kitchen torsemide (DEMADEX) 20 MG tablet Take 1 tablet (20 mg total) by mouth daily.   Marland Kitchen warfarin (COUMADIN) 6 MG tablet Take 6 mg by mouth at bedtime.     Allergies:   Patient has no known allergies.   Social History   Socioeconomic History  . Marital status: Married    Spouse name: Not on file  . Number of children: Not on file  . Years of education: Not on file  . Highest education level: Not on file  Occupational History  . Occupation: retired  Tobacco Use  . Smoking status: Former Smoker    Packs/day: 1.00    Years: 30.00    Pack years: 30.00    Quit date: 1975    Years since quitting: 47.0  . Smokeless tobacco: Never Used  Substance and Sexual Activity  . Alcohol use: Not Currently  . Drug use: Never  . Sexual activity: Not on file  Other Topics Concern  . Not on file  Social History Narrative  . Not on file   Social Determinants of Health   Financial Resource Strain: Not on file  Food Insecurity: No Food Insecurity  . Worried About Charity fundraiser in the Last Year: Never true  . Ran Out of Food in the Last Year: Never true  Transportation Needs: No Transportation Needs  . Lack of Transportation (Medical): No  . Lack of Transportation (Non-Medical): No  Physical Activity: Not on file  Stress: Not on file  Social Connections: Not on file     Family History: There is no history of heart valve replacement or myocardial infarction in the patient's first-degree relatives.  ROS:   Please see the history of present illness.    All other systems reviewed and are negative.  EKGs/Labs/Other Studies Reviewed:    The following studies were reviewed today: Echo 07/16/2020: IMPRESSIONS    1. Left ventricular ejection fraction, by estimation, is 55 to 60%. The  left ventricle has normal function. The left ventricle has no regional  wall motion abnormalities. There is moderate left ventricular hypertrophy.  Left ventricular diastolic  parameters are indeterminate.  2. Right ventricular systolic function is moderately reduced. The right   ventricular size is mildly enlarged. There is mildly elevated pulmonary  artery systolic pressure.  3. Left atrial size was moderately dilated.  4. Mild functional mitral stenosis due to severe MAC. The mitral valve is  degenerative. Mild mitral valve regurgitation. Mild mitral stenosis.  Severe mitral annular calcification.  5. Tricuspid valve regurgitation is moderate.  6. The aortic valve is tricuspid. There is severe calcifcation of the  aortic valve. There is severe thickening of the aortic valve. Aortic valve  regurgitation is not visualized. Severe aortic valve stenosis.  7. The inferior vena cava is normal in size with greater than 50%  respiratory variability, suggesting right atrial pressure of 3 mmHg.  LEFT VENTRICLE  PLAX 2D  LVIDd:     5.10 cm  LVIDs:  3.10 cm  LV PW:     1.40 cm  LV IVS:    1.40 cm  LVOT diam:   2.40 cm  LV SV:     82  LV SV Index:  42  LVOT Area:   4.52 cm     RIGHT VENTRICLE      IVC  RV Basal diam: 3.50 cm  IVC diam: 1.60 cm  RV Mid diam:  3.00 cm  RV S prime:   8.59 cm/s  TAPSE (M-mode): 1.3 cm   LEFT ATRIUM       Index    RIGHT ATRIUM      Index  LA diam:    5.60 cm 2.90 cm/m  RA Area:   35.60 cm  LA Vol (A2C):  252.0 ml 130.56 ml/m RA Volume:  135.00 ml 69.94 ml/m  LA Vol (A4C):  185.0 ml 95.85 ml/m  LA Biplane Vol: 223.0 ml 115.53 ml/m  AORTIC VALVE  AV Area (Vmax):  0.87 cm  AV Area (Vmean):  0.84 cm  AV Area (VTI):   0.83 cm  AV Vmax:      398.25 cm/s  AV Vmean:     284.750 cm/s  AV VTI:      0.980 m  AV Peak Grad:   63.4 mmHg  AV Mean Grad:   37.0 mmHg  LVOT Vmax:     76.75 cm/s  LVOT Vmean:    52.700 cm/s  LVOT VTI:     0.180 m  LVOT/AV VTI ratio: 0.18    AORTA  Ao Root diam: 3.60 cm  Ao Asc diam: 3.50 cm   MITRAL VALVE        TRICUSPID VALVE  MV Area (PHT): 1.67 cm   TR Peak grad:   37.9 mmHg  MV Peak grad: 14.2 mmHg  TR Vmax:    308.00 cm/s  MV Mean grad: 4.5 mmHg  MV Vmax:    1.88 m/s   SHUNTS  MV Vmean:   89.2 cm/s  Systemic VTI: 0.18 m  MV Decel Time: 454 msec   Systemic Diam: 2.40 cm  MV E velocity: 181.00 cm/s  MV A velocity: 58.20 cm/s  MV E/A ratio: 3.11   EKG:  EKG is ordered today.  The ekg ordered today demonstrates atrial fibrillation with controlled ventricular rate  Recent Labs: 07/15/2020: ALT 15; B Natriuretic Peptide 487.9; TSH 0.398 07/17/2020: Magnesium 2.1 08/12/2020: BUN 29; Creatinine, Ser 1.12; Hemoglobin 13.1; Platelets 198; Potassium 4.6; Sodium 146  Recent Lipid Panel No results found for: CHOL, TRIG, HDL, CHOLHDL, VLDL, LDLCALC, LDLDIRECT   Risk Assessment/Calculations:     CHA2DS2-VASc Score = 3  This indicates a 3.2% annual risk of stroke. The patient's score is based upon: CHF History: Yes HTN History: No Diabetes History: No Stroke History: No Vascular Disease History: No Age Score: 2 Gender Score: 0      Physical Exam:    VS:  BP 112/64   Pulse 79   Ht _0  (1.702 m)   Wt 193 lb 9.6 oz (87.8 kg)   SpO2 98%   BMI 30.32 kg/m     Wt Readings from Last 3 Encounters:  08/12/20 193 lb 9.6 oz (87.8 kg)  07/19/20 175 lb 6.4 oz (79.6 kg)     GEN: Elderly male, on oxygen per nasal cannula, in no acute distress HEENT: Normal NECK: No JVD; No carotid bruits LYMPHATICS: No lymphadenopathy CARDIAC: Irregularly irregular, grade 3/6 harsh late peaking systolic murmur best heard  at the LV apex RESPIRATORY:  Clear to auscultation without rales, wheezing or rhonchi  ABDOMEN: Soft, non-tender, non-distended MUSCULOSKELETAL: Trace bilateral pretibial edema; No deformity  SKIN: Warm and dry NEUROLOGIC:  Alert and oriented x 3 PSYCHIATRIC:  Normal affect   STS Risk Calculator: Risk of Mortality: 4.716% Renal Failure: 5.489% Permanent Stroke: 1.897% Prolonged Ventilation: 17.151% DSW  Infection: 0.163% Reoperation: 3.534% Morbidity or Mortality: 24.355% Short Length of Stay: 11.902% Long Length of Stay: 17.826%  ASSESSMENT:    1. Severe aortic stenosis   2. Shortness of breath   3. Permanent atrial fibrillation (HCC)    PLAN:    In order of problems listed above:  1. The patient has severe, stage D1 aortic stenosis associated with recent episode of multifactorial respiratory failure with both enzymatic and radiographic evidence of congestive heart failure during his recent hospitalization.  I have personally reviewed his echo study which confirmed severe aortic stenosis with heavy calcification of the aortic valve leaflets.  The right coronary leaflet is nearly fixed.  The 2D imaging is difficult because of poor parasternal acoustic windows.  Peak and mean transaortic valve gradients are 63 and 37 mmHg respectively.  Aortic valve area is 0.8 cm, peak systolic velocity is 4 m/s, and dimensionless index is 0.18. These findings are clearly consistent with severe aortic stenosis.  LV function is preserved with an LVEF of 55 to 60%.  There is severe mitral annular calcification with associated mild mitral valve stenosis. I have reviewed the natural history of aortic stenosis with the patient and their family members who are present today. We have discussed the limitations of medical therapy and the poor prognosis associated with symptomatic aortic stenosis. We have reviewed potential treatment options, including palliative medical therapy, conventional surgical aortic valve replacement, and transcatheter aortic valve replacement. We discussed treatment options in the context of the patient's specific comorbid medical conditions.  Considering the patient's advanced age and comorbid conditions, it is highly unlikely that he would be a surgical candidate.  TAVR might be a reasonable treatment option for this patient with severe symptomatic aortic stenosis.  We reviewed the TAVR  procedure today and I discussed risks, indications, and potential benefits with the patient and his son.  They understand that he will require further evaluation with right and left heart catheterization to assess hemodynamics and coronary anatomy. I have reviewed the risks, indications, and alternatives to cardiac catheterization, possible angioplasty, and stenting with the patient. Risks include but are not limited to bleeding, infection, vascular injury, stroke, myocardial infection, arrhythmia, kidney injury, radiation-related injury in the case of prolonged fluoroscopy use, emergency cardiac surgery, and death. The patient understands the risks of serious complication is 1-2 in 3976 with diagnostic cardiac cath and 1-2% or less with angioplasty/stenting.  The patient will also undergo a gated CTA of the heart and the chest, abdomen, and pelvis to assess anatomic feasibility of TAVR.  Once his cardiac catheterization and CTA studies are completed, he will undergo formal cardiac surgical consultation.  All of their questions are answered today. 2. Multifactorial.  Recommend repeat chest x-ray.  Continue current medical therapy including daily oral loop diuretic. 3. Longstanding atrial fibrillation.  Continue warfarin.  Heart rate is controlled.  Patient is instructed to hold warfarin 5 days before cardiac catheterization.   Shared Decision Making/Informed Consent The risks [stroke (1 in 1000), death (1 in 1000), kidney failure [usually temporary] (1 in 500), bleeding (1 in 200), allergic reaction [possibly serious] (1 in 200)], benefits (diagnostic  support and management of coronary artery disease) and alternatives of a cardiac catheterization were discussed in detail with Mr. Slawinski and he is willing to proceed.  Medication Adjustments/Labs and Tests Ordered: Current medicines are reviewed at length with the patient today.  Concerns regarding medicines are outlined above.  Orders Placed This Encounter   Procedures  . CT CORONARY MORPH W/CTA COR W/SCORE W/CA W/CM &/OR WO/CM  . CT ANGIO ABDOMEN PELVIS  W &/OR WO CONTRAST  . CT ANGIO CHEST AORTA W/CM & OR WO/CM  . DG Chest 2 View  . Basic metabolic panel  . CBC with Differential/Platelet  . EKG 12-Lead  . VAS US CAROTID   No orders of the defined types were placed in this encounter.   Patient Instructions  COVID SCREENING INFORMATION (08/24/20: You are scheduled for your drive-thru COVID screening on: 08/24/2020 at 1:00PM. Pre-Procedural COVID-19 Testing Site [address] [place], [place] [address] You will need to go home after your screening and quarantine until your procedure.   CATHETERIZATION INSTRUCTIONS (08/26/20):  You are scheduled for a Cardiac Catheterization on: 08/26/2020 with Dr. Burt Knack.  1. Please arrive at the [facility] (Main Entrance A) at [facility]: [address] [place], [place] [address] at 7:00AM (This time is two hours before your procedure to ensure your preparation). Free valet parking service is available. You are allowed ONE visitor in the waiting room during your procedure. Both you and your guest must wear masks. Special note: Every effort is made to have your procedure done on time. Please understand that emergencies sometimes delay scheduled procedures.  2. Diet: Do not eat solid foods after midnight.  You may have clear liquids until 5am upon the day of the procedure.  3. Labs: TODAY! BMET, CBC  4. Medication instructions in preparation for your procedure:  1) HOLD COUMADIN 5 days prior to your procedure  2) HOLD TORSEMIDE the morning of your cath  3) TAKE ASPIRIN 81 mg the morning of your cath  4) You may take your other medications as directed with sips of water  5. Plan for one night stay--bring personal belongings. 6. Bring a current list of your medications and current insurance cards. 7. You MUST have a responsible person to drive you home. 8. Someone MUST be with you the first  24 hours after you arrive home or your discharge will be delayed. 9. Please wear clothes that are easy to get on and off and wear slip-on shoes.  Thank you for allowing [place] to care for you!   -- [facility] Invasive Cardiovascular services     Signed, Sherren Mocha, MD  08/13/2020 8:54 AM    [place]

## 2020-08-12 NOTE — H&P (View-Only) (Signed)
Cardiology Office Note:    Date:  08/13/2020   ID:  [name], DOB Jul 07, 1927, MRN [id number]  PCP:  Alroy Dust, L.Marlou Sa, MD  [place] Cardiologist:  No primary care provider on file.  [facility] Electrophysiologist:  None   Referring MD: Alroy Dust, L.Marlou Sa, MD   Chief Complaint  Patient presents with  . Shortness of Breath    History of Present Illness:    Ruben [name] is a 87 y.o. male presenting for evaluation of severe aortic stenosis, referred by [name].  The patient recently relocated to [facility] from [place] to be closer to his son.  The patient is here with his son, [name], today. [name] is Clinical biochemist of the labs at [employer]. The patient's wife has recently suffered a stroke but is now back home and making improvements. They are living with [name] at the present time.   The patient was hospitalized 12/10-12/14/2021 with rhinovirus infection and COPD exacerbation. He was discharged on home O2 (first time he has evere required O2 at home). He does have COPD and he quit smoking remotely in 1975. He has followed with a cardiologist in [place] before relocating here, and he has known about having aortic stenosis. He has been told he will likely require TAVR at some point.  The patient denies chest pain or pressure.  He has reported mild chronic leg swelling with no recent change.  States that he has a good response to oral diuretic therapy with frequent urination after he takes his morning diuretic pills.  He has experienced worsening shortness of breath this week and was noted to have lower oxygen saturations were requiring use of home oxygen again.  No cough, fever, or chills.  He reports mild orthopnea without PND.  He still drives a car. He is limited somewhat by pain in the shoulders and back that he attributes to arthritis. Otherwise he hasn't had a lot of medical problems. His surgical history includes only bilateral knee replacements and an appendectomy.    Regarding his dental history, he has an upper bridge and reports regular dental care over the years. He doesn't currently have any problems with his teeth or gums.   Past Medical History:  Diagnosis Date  . Arthritis   . CHF (congestive heart failure) ([place])   . COPD (chronic obstructive pulmonary disease) ([place])     Past Surgical History:  Procedure Laterality Date  . TOTAL KNEE ARTHROPLASTY Bilateral     Current Medications: Current Meds  Medication Sig  . acetaminophen (TYLENOL) 500 MG tablet Take 1,000 mg by mouth every 6 (six) hours as needed for headache (pain).  Marland Kitchen ALPRAZolam (XANAX) 0.25 MG tablet Take 0.125 mg by mouth at bedtime as needed for anxiety or sleep.  Marland Kitchen docusate sodium (COLACE) 100 MG capsule Take 1 capsule (100 mg total) by mouth 2 (two) times daily.  Marland Kitchen dutasteride (AVODART) 0.5 MG capsule Take 0.5 mg by mouth daily.  Marland Kitchen HYDROcodone-acetaminophen (NORCO) 7.5-325 MG tablet Take 1 tablet by mouth 3 (three) times daily as needed (pain).  Marland Kitchen levalbuterol (XOPENEX HFA) 45 MCG/ACT inhaler Inhale 2 puffs into the lungs every 4 (four) hours as needed for wheezing or shortness of breath.  . levalbuterol (XOPENEX) 0.63 MG/3ML nebulizer solution Take 0.63 mg by nebulization 2 (two) times daily.  . metoprolol succinate (TOPROL-XL) 50 MG 24 hr tablet Take 50 mg by mouth daily.  . montelukast (SINGULAIR) 10 MG tablet Take 10 mg by mouth daily.  . polyethylene glycol (  MIRALAX / GLYCOLAX) 17 g packet Take 17 g by mouth daily as needed for mild constipation.  . potassium chloride (KLOR-CON) 8 MEQ tablet Take 8 mEq by mouth daily.  Marland Kitchen Propylene Glycol (SYSTANE COMPLETE) 0.6 % SOLN Place 1 drop into both eyes daily as needed (dry eyes).  . tamsulosin (FLOMAX) 0.4 MG CAPS capsule Take 0.8 mg by mouth at bedtime.  . tizanidine (ZANAFLEX) 2 MG capsule Take 2 mg by mouth at bedtime as needed for muscle spasms.  Marland Kitchen torsemide (DEMADEX) 20 MG tablet Take 1 tablet (20 mg total) by mouth daily.   Marland Kitchen warfarin (COUMADIN) 6 MG tablet Take 6 mg by mouth at bedtime.     Allergies:   Patient has no known allergies.   Social History   Socioeconomic History  . Marital status: Married    Spouse name: Not on file  . Number of children: Not on file  . Years of education: Not on file  . Highest education level: Not on file  Occupational History  . Occupation: retired  Tobacco Use  . Smoking status: Former Smoker    Packs/day: 1.00    Years: 30.00    Pack years: 30.00    Quit date: 1975    Years since quitting: 47.0  . Smokeless tobacco: Never Used  Substance and Sexual Activity  . Alcohol use: Not Currently  . Drug use: Never  . Sexual activity: Not on file  Other Topics Concern  . Not on file  Social History Narrative  . Not on file   Social Determinants of Health   Financial Resource Strain: Not on file  Food Insecurity: No Food Insecurity  . Worried About Charity fundraiser in the Last Year: Never true  . Ran Out of Food in the Last Year: Never true  Transportation Needs: No Transportation Needs  . Lack of Transportation (Medical): No  . Lack of Transportation (Non-Medical): No  Physical Activity: Not on file  Stress: Not on file  Social Connections: Not on file     Family History: There is no history of heart valve replacement or myocardial infarction in the patient's first-degree relatives.  ROS:   Please see the history of present illness.    All other systems reviewed and are negative.  EKGs/Labs/Other Studies Reviewed:    The following studies were reviewed today: Echo 07/16/2020: IMPRESSIONS    1. Left ventricular ejection fraction, by estimation, is 55 to 60%. The  left ventricle has normal function. The left ventricle has no regional  wall motion abnormalities. There is moderate left ventricular hypertrophy.  Left ventricular diastolic  parameters are indeterminate.  2. Right ventricular systolic function is moderately reduced. The right   ventricular size is mildly enlarged. There is mildly elevated pulmonary  artery systolic pressure.  3. Left atrial size was moderately dilated.  4. Mild functional mitral stenosis due to severe MAC. The mitral valve is  degenerative. Mild mitral valve regurgitation. Mild mitral stenosis.  Severe mitral annular calcification.  5. Tricuspid valve regurgitation is moderate.  6. The aortic valve is tricuspid. There is severe calcifcation of the  aortic valve. There is severe thickening of the aortic valve. Aortic valve  regurgitation is not visualized. Severe aortic valve stenosis.  7. The inferior vena cava is normal in size with greater than 50%  respiratory variability, suggesting right atrial pressure of 3 mmHg.  LEFT VENTRICLE  PLAX 2D  LVIDd:     5.10 cm  LVIDs:  3.10 cm  LV PW:     1.40 cm  LV IVS:    1.40 cm  LVOT diam:   2.40 cm  LV SV:     82  LV SV Index:  42  LVOT Area:   4.52 cm     RIGHT VENTRICLE      IVC  RV Basal diam: 3.50 cm  IVC diam: 1.60 cm  RV Mid diam:  3.00 cm  RV S prime:   8.59 cm/s  TAPSE (M-mode): 1.3 cm   LEFT ATRIUM       Index    RIGHT ATRIUM      Index  LA diam:    5.60 cm 2.90 cm/m  RA Area:   35.60 cm  LA Vol (A2C):  252.0 ml 130.56 ml/m RA Volume:  135.00 ml 69.94 ml/m  LA Vol (A4C):  185.0 ml 95.85 ml/m  LA Biplane Vol: 223.0 ml 115.53 ml/m  AORTIC VALVE  AV Area (Vmax):  0.87 cm  AV Area (Vmean):  0.84 cm  AV Area (VTI):   0.83 cm  AV Vmax:      398.25 cm/s  AV Vmean:     284.750 cm/s  AV VTI:      0.980 m  AV Peak Grad:   63.4 mmHg  AV Mean Grad:   37.0 mmHg  LVOT Vmax:     76.75 cm/s  LVOT Vmean:    52.700 cm/s  LVOT VTI:     0.180 m  LVOT/AV VTI ratio: 0.18    AORTA  Ao Root diam: 3.60 cm  Ao Asc diam: 3.50 cm   MITRAL VALVE        TRICUSPID VALVE  MV Area (PHT): 1.67 cm   TR Peak grad:   37.9 mmHg  MV Peak grad: 14.2 mmHg  TR Vmax:    308.00 cm/s  MV Mean grad: 4.5 mmHg  MV Vmax:    1.88 m/s   SHUNTS  MV Vmean:   89.2 cm/s  Systemic VTI: 0.18 m  MV Decel Time: 454 msec   Systemic Diam: 2.40 cm  MV E velocity: 181.00 cm/s  MV A velocity: 58.20 cm/s  MV E/A ratio: 3.11   EKG:  EKG is ordered today.  The ekg ordered today demonstrates atrial fibrillation with controlled ventricular rate  Recent Labs: 07/15/2020: ALT 15; B Natriuretic Peptide 487.9; TSH 0.398 07/17/2020: Magnesium 2.1 08/12/2020: BUN 29; Creatinine, Ser 1.12; Hemoglobin 13.1; Platelets 198; Potassium 4.6; Sodium 146  Recent Lipid Panel No results found for: CHOL, TRIG, HDL, CHOLHDL, VLDL, LDLCALC, LDLDIRECT   Risk Assessment/Calculations:     CHA2DS2-VASc Score = 3  This indicates a 3.2% annual risk of stroke. The patient's score is based upon: CHF History: Yes HTN History: No Diabetes History: No Stroke History: No Vascular Disease History: No Age Score: 2 Gender Score: 0      Physical Exam:    VS:  BP 112/64   Pulse 79   Ht _0  (1.702 m)   Wt 193 lb 9.6 oz (87.8 kg)   SpO2 98%   BMI 30.32 kg/m     Wt Readings from Last 3 Encounters:  08/12/20 193 lb 9.6 oz (87.8 kg)  07/19/20 175 lb 6.4 oz (79.6 kg)     GEN: Elderly male, on oxygen per nasal cannula, in no acute distress HEENT: Normal NECK: No JVD; No carotid bruits LYMPHATICS: No lymphadenopathy CARDIAC: Irregularly irregular, grade 3/6 harsh late peaking systolic murmur best heard  at the LV apex RESPIRATORY:  Clear to auscultation without rales, wheezing or rhonchi  ABDOMEN: Soft, non-tender, non-distended MUSCULOSKELETAL: Trace bilateral pretibial edema; No deformity  SKIN: Warm and dry NEUROLOGIC:  Alert and oriented x 3 PSYCHIATRIC:  Normal affect   STS Risk Calculator: Risk of Mortality: 4.716% Renal Failure: 5.489% Permanent Stroke: 1.897% Prolonged Ventilation: 17.151% DSW  Infection: 0.163% Reoperation: 3.534% Morbidity or Mortality: 24.355% Short Length of Stay: 11.902% Long Length of Stay: 17.826%  ASSESSMENT:    1. Severe aortic stenosis   2. Shortness of breath   3. Permanent atrial fibrillation (HCC)    PLAN:    In order of problems listed above:  1. The patient has severe, stage D1 aortic stenosis associated with recent episode of multifactorial respiratory failure with both enzymatic and radiographic evidence of congestive heart failure during his recent hospitalization.  I have personally reviewed his echo study which confirmed severe aortic stenosis with heavy calcification of the aortic valve leaflets.  The right coronary leaflet is nearly fixed.  The 2D imaging is difficult because of poor parasternal acoustic windows.  Peak and mean transaortic valve gradients are 63 and 37 mmHg respectively.  Aortic valve area is 0.8 cm, peak systolic velocity is 4 m/s, and dimensionless index is 0.18. These findings are clearly consistent with severe aortic stenosis.  LV function is preserved with an LVEF of 55 to 60%.  There is severe mitral annular calcification with associated mild mitral valve stenosis. I have reviewed the natural history of aortic stenosis with the patient and their family members who are present today. We have discussed the limitations of medical therapy and the poor prognosis associated with symptomatic aortic stenosis. We have reviewed potential treatment options, including palliative medical therapy, conventional surgical aortic valve replacement, and transcatheter aortic valve replacement. We discussed treatment options in the context of the patient's specific comorbid medical conditions.  Considering the patient's advanced age and comorbid conditions, it is highly unlikely that he would be a surgical candidate.  TAVR might be a reasonable treatment option for this patient with severe symptomatic aortic stenosis.  We reviewed the TAVR  procedure today and I discussed risks, indications, and potential benefits with the patient and his son.  They understand that he will require further evaluation with right and left heart catheterization to assess hemodynamics and coronary anatomy. I have reviewed the risks, indications, and alternatives to cardiac catheterization, possible angioplasty, and stenting with the patient. Risks include but are not limited to bleeding, infection, vascular injury, stroke, myocardial infection, arrhythmia, kidney injury, radiation-related injury in the case of prolonged fluoroscopy use, emergency cardiac surgery, and death. The patient understands the risks of serious complication is 1-2 in 1000 with diagnostic cardiac cath and 1-2% or less with angioplasty/stenting.  The patient will also undergo a gated CTA of the heart and the chest, abdomen, and pelvis to assess anatomic feasibility of TAVR.  Once his cardiac catheterization and CTA studies are completed, he will undergo formal cardiac surgical consultation.  All of their questions are answered today. 2. Multifactorial.  Recommend repeat chest x-ray.  Continue current medical therapy including daily oral loop diuretic. 3. Longstanding atrial fibrillation.  Continue warfarin.  Heart rate is controlled.  Patient is instructed to hold warfarin 5 days before cardiac catheterization.   Shared Decision Making/Informed Consent The risks [stroke (1 in 1000), death (1 in 1000), kidney failure [usually temporary] (1 in 500), bleeding (1 in 200), allergic reaction [possibly serious] (1 in 200)], benefits (diagnostic   support and management of coronary artery disease) and alternatives of a cardiac catheterization were discussed in detail with Mr. Slawinski and he is willing to proceed.  Medication Adjustments/Labs and Tests Ordered: Current medicines are reviewed at length with the patient today.  Concerns regarding medicines are outlined above.  Orders Placed This Encounter   Procedures  . CT CORONARY MORPH W/CTA COR W/SCORE W/CA W/CM &/OR WO/CM  . CT ANGIO ABDOMEN PELVIS  W &/OR WO CONTRAST  . CT ANGIO CHEST AORTA W/CM & OR WO/CM  . DG Chest 2 View  . Basic metabolic panel  . CBC with Differential/Platelet  . EKG 12-Lead  . VAS US CAROTID   No orders of the defined types were placed in this encounter.   Patient Instructions  COVID SCREENING INFORMATION (08/24/20: You are scheduled for your drive-thru COVID screening on: 08/24/2020 at 1:00PM. Pre-Procedural COVID-19 Testing Site [address] [place], [place] [address] You will need to go home after your screening and quarantine until your procedure.   CATHETERIZATION INSTRUCTIONS (08/26/20):  You are scheduled for a Cardiac Catheterization on: 08/26/2020 with Dr. Burt Knack.  1. Please arrive at the [facility] (Main Entrance A) at [facility]: [address] [place], [place] [address] at 7:00AM (This time is two hours before your procedure to ensure your preparation). Free valet parking service is available. You are allowed ONE visitor in the waiting room during your procedure. Both you and your guest must wear masks. Special note: Every effort is made to have your procedure done on time. Please understand that emergencies sometimes delay scheduled procedures.  2. Diet: Do not eat solid foods after midnight.  You may have clear liquids until 5am upon the day of the procedure.  3. Labs: TODAY! BMET, CBC  4. Medication instructions in preparation for your procedure:  1) HOLD COUMADIN 5 days prior to your procedure  2) HOLD TORSEMIDE the morning of your cath  3) TAKE ASPIRIN 81 mg the morning of your cath  4) You may take your other medications as directed with sips of water  5. Plan for one night stay--bring personal belongings. 6. Bring a current list of your medications and current insurance cards. 7. You MUST have a responsible person to drive you home. 8. Someone MUST be with you the first  24 hours after you arrive home or your discharge will be delayed. 9. Please wear clothes that are easy to get on and off and wear slip-on shoes.  Thank you for allowing [place] to care for you!   -- [facility] Invasive Cardiovascular services     Signed, Sherren Mocha, MD  08/13/2020 8:54 AM    [place]

## 2020-08-12 NOTE — H&P (View-Only) (Signed)
Cardiology Office Note:    Date:  08/13/2020   ID:  Ruben [name], DOB Jul 07, 1927, MRN [id number]  PCP:  Alroy Dust, L.Marlou Sa, MD  [place] Cardiologist:  No primary care provider on file.  [facility] Electrophysiologist:  None   Referring MD: Alroy Dust, L.Marlou Sa, MD   Chief Complaint  Patient presents with  . Shortness of Breath    History of Present Illness:    Ruben [name] is a 87 y.o. male presenting for evaluation of severe aortic stenosis, referred by [name].  The patient recently relocated to [facility] from [place] to be closer to his son.  The patient is here with his son, Ruben [name], today. 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 The patient's wife has recently suffered a stroke but is now back home and making improvements. They are living with Ruben [name] at the present time.   The patient was hospitalized 12/10-12/14/2021 with rhinovirus infection and COPD exacerbation. He was discharged on home O2 (first time he has evere required O2 at home). He does have COPD and he quit smoking remotely in 1975. He has followed with a cardiologist in [place] before relocating here, and he has known about having aortic stenosis. He has been told he will likely require TAVR at some point.  The patient denies chest pain or pressure.  He has reported mild chronic leg swelling with no recent change.  States that he has a good response to oral diuretic therapy with frequent urination after he takes his morning diuretic pills.  He has experienced worsening shortness of breath this week and was noted to have lower oxygen saturations were requiring use of home oxygen again.  No cough, fever, or chills.  He reports mild orthopnea without PND.  He still drives a car. He is limited somewhat by pain in the shoulders and back that he attributes to arthritis. Otherwise he hasn't had a lot of medical problems. His surgical history includes only bilateral knee replacements and an appendectomy.    Regarding his dental history, he has an upper bridge and reports regular dental care over the years. He doesn't currently have any problems with his teeth or gums.   Past Medical History:  Diagnosis Date  . Arthritis   . CHF (congestive heart failure) ([place])   . COPD (chronic obstructive pulmonary disease) ([place])     Past Surgical History:  Procedure Laterality Date  . TOTAL KNEE ARTHROPLASTY Bilateral     Current Medications: Current Meds  Medication Sig  . acetaminophen (TYLENOL) 500 MG tablet Take 1,000 mg by mouth every 6 (six) hours as needed for headache (pain).  Marland Kitchen ALPRAZolam (XANAX) 0.25 MG tablet Take 0.125 mg by mouth at bedtime as needed for anxiety or sleep.  Marland Kitchen docusate sodium (COLACE) 100 MG capsule Take 1 capsule (100 mg total) by mouth 2 (two) times daily.  Marland Kitchen dutasteride (AVODART) 0.5 MG capsule Take 0.5 mg by mouth daily.  Marland Kitchen HYDROcodone-acetaminophen (NORCO) 7.5-325 MG tablet Take 1 tablet by mouth 3 (three) times daily as needed (pain).  Marland Kitchen levalbuterol (XOPENEX HFA) 45 MCG/ACT inhaler Inhale 2 puffs into the lungs every 4 (four) hours as needed for wheezing or shortness of breath.  . levalbuterol (XOPENEX) 0.63 MG/3ML nebulizer solution Take 0.63 mg by nebulization 2 (two) times daily.  . metoprolol succinate (TOPROL-XL) 50 MG 24 hr tablet Take 50 mg by mouth daily.  . montelukast (SINGULAIR) 10 MG tablet Take 10 mg by mouth daily.  . polyethylene glycol (  MIRALAX / GLYCOLAX) 17 g packet Take 17 g by mouth daily as needed for mild constipation.  . potassium chloride (KLOR-CON) 8 MEQ tablet Take 8 mEq by mouth daily.  Marland Kitchen Propylene Glycol (SYSTANE COMPLETE) 0.6 % SOLN Place 1 drop into both eyes daily as needed (dry eyes).  . tamsulosin (FLOMAX) 0.4 MG CAPS capsule Take 0.8 mg by mouth at bedtime.  . tizanidine (ZANAFLEX) 2 MG capsule Take 2 mg by mouth at bedtime as needed for muscle spasms.  Marland Kitchen torsemide (DEMADEX) 20 MG tablet Take 1 tablet (20 mg total) by mouth daily.   Marland Kitchen warfarin (COUMADIN) 6 MG tablet Take 6 mg by mouth at bedtime.     Allergies:   Patient has no known allergies.   Social History   Socioeconomic History  . Marital status: Married    Spouse name: Not on file  . Number of children: Not on file  . Years of education: Not on file  . Highest education level: Not on file  Occupational History  . Occupation: retired  Tobacco Use  . Smoking status: Former Smoker    Packs/day: 1.00    Years: 30.00    Pack years: 30.00    Quit date: 1975    Years since quitting: 47.0  . Smokeless tobacco: Never Used  Substance and Sexual Activity  . Alcohol use: Not Currently  . Drug use: Never  . Sexual activity: Not on file  Other Topics Concern  . Not on file  Social History Narrative  . Not on file   Social Determinants of Health   Financial Resource Strain: Not on file  Food Insecurity: No Food Insecurity  . Worried About Charity fundraiser in the Last Year: Never true  . Ran Out of Food in the Last Year: Never true  Transportation Needs: No Transportation Needs  . Lack of Transportation (Medical): No  . Lack of Transportation (Non-Medical): No  Physical Activity: Not on file  Stress: Not on file  Social Connections: Not on file     Family History: There is no history of heart valve replacement or myocardial infarction in the patient's first-degree relatives.  ROS:   Please see the history of present illness.    All other systems reviewed and are negative.  EKGs/Labs/Other Studies Reviewed:    The following studies were reviewed today: Echo 07/16/2020: IMPRESSIONS    1. Left ventricular ejection fraction, by estimation, is 55 to 60%. The  left ventricle has normal function. The left ventricle has no regional  wall motion abnormalities. There is moderate left ventricular hypertrophy.  Left ventricular diastolic  parameters are indeterminate.  2. Right ventricular systolic function is moderately reduced. The right   ventricular size is mildly enlarged. There is mildly elevated pulmonary  artery systolic pressure.  3. Left atrial size was moderately dilated.  4. Mild functional mitral stenosis due to severe MAC. The mitral valve is  degenerative. Mild mitral valve regurgitation. Mild mitral stenosis.  Severe mitral annular calcification.  5. Tricuspid valve regurgitation is moderate.  6. The aortic valve is tricuspid. There is severe calcifcation of the  aortic valve. There is severe thickening of the aortic valve. Aortic valve  regurgitation is not visualized. Severe aortic valve stenosis.  7. The inferior vena cava is normal in size with greater than 50%  respiratory variability, suggesting right atrial pressure of 3 mmHg.  LEFT VENTRICLE  PLAX 2D  LVIDd:     5.10 cm  LVIDs:  3.10 cm  LV PW:     1.40 cm  LV IVS:    1.40 cm  LVOT diam:   2.40 cm  LV SV:     82  LV SV Index:  42  LVOT Area:   4.52 cm     RIGHT VENTRICLE      IVC  RV Basal diam: 3.50 cm  IVC diam: 1.60 cm  RV Mid diam:  3.00 cm  RV S prime:   8.59 cm/s  TAPSE (M-mode): 1.3 cm   LEFT ATRIUM       Index    RIGHT ATRIUM      Index  LA diam:    5.60 cm 2.90 cm/m  RA Area:   35.60 cm  LA Vol (A2C):  252.0 ml 130.56 ml/m RA Volume:  135.00 ml 69.94 ml/m  LA Vol (A4C):  185.0 ml 95.85 ml/m  LA Biplane Vol: 223.0 ml 115.53 ml/m  AORTIC VALVE  AV Area (Vmax):  0.87 cm  AV Area (Vmean):  0.84 cm  AV Area (VTI):   0.83 cm  AV Vmax:      398.25 cm/s  AV Vmean:     284.750 cm/s  AV VTI:      0.980 m  AV Peak Grad:   63.4 mmHg  AV Mean Grad:   37.0 mmHg  LVOT Vmax:     76.75 cm/s  LVOT Vmean:    52.700 cm/s  LVOT VTI:     0.180 m  LVOT/AV VTI ratio: 0.18    AORTA  Ao Root diam: 3.60 cm  Ao Asc diam: 3.50 cm   MITRAL VALVE        TRICUSPID VALVE  MV Area (PHT): 1.67 cm   TR Peak grad:   37.9 mmHg  MV Peak grad: 14.2 mmHg  TR Vmax:    308.00 cm/s  MV Mean grad: 4.5 mmHg  MV Vmax:    1.88 m/s   SHUNTS  MV Vmean:   89.2 cm/s  Systemic VTI: 0.18 m  MV Decel Time: 454 msec   Systemic Diam: 2.40 cm  MV E velocity: 181.00 cm/s  MV A velocity: 58.20 cm/s  MV E/A ratio: 3.11   EKG:  EKG is ordered today.  The ekg ordered today demonstrates atrial fibrillation with controlled ventricular rate  Recent Labs: 07/15/2020: ALT 15; B Natriuretic Peptide 487.9; TSH 0.398 07/17/2020: Magnesium 2.1 08/12/2020: BUN 29; Creatinine, Ser 1.12; Hemoglobin 13.1; Platelets 198; Potassium 4.6; Sodium 146  Recent Lipid Panel No results found for: CHOL, TRIG, HDL, CHOLHDL, VLDL, LDLCALC, LDLDIRECT   Risk Assessment/Calculations:     CHA2DS2-VASc Score = 3  This indicates a 3.2% annual risk of stroke. The patient's score is based upon: CHF History: Yes HTN History: No Diabetes History: No Stroke History: No Vascular Disease History: No Age Score: 2 Gender Score: 0      Physical Exam:    VS:  BP 112/64   Pulse 79   Ht _0  (1.702 m)   Wt 193 lb 9.6 oz (87.8 kg)   SpO2 98%   BMI 30.32 kg/m     Wt Readings from Last 3 Encounters:  08/12/20 193 lb 9.6 oz (87.8 kg)  07/19/20 175 lb 6.4 oz (79.6 kg)     GEN: Elderly male, on oxygen per nasal cannula, in no acute distress HEENT: Normal NECK: No JVD; No carotid bruits LYMPHATICS: No lymphadenopathy CARDIAC: Irregularly irregular, grade 3/6 harsh late peaking systolic murmur best heard  at the LV apex RESPIRATORY:  Clear to auscultation without rales, wheezing or rhonchi  ABDOMEN: Soft, non-tender, non-distended MUSCULOSKELETAL: Trace bilateral pretibial edema; No deformity  SKIN: Warm and dry NEUROLOGIC:  Alert and oriented x 3 PSYCHIATRIC:  Normal affect   STS Risk Calculator: Risk of Mortality: 4.716% Renal Failure: 5.489% Permanent Stroke: 1.897% Prolonged Ventilation: 17.151% DSW  Infection: 0.163% Reoperation: 3.534% Morbidity or Mortality: 24.355% Short Length of Stay: 11.902% Long Length of Stay: 17.826%  ASSESSMENT:    1. Severe aortic stenosis   2. Shortness of breath   3. Permanent atrial fibrillation (HCC)    PLAN:    In order of problems listed above:  1. The patient has severe, stage D1 aortic stenosis associated with recent episode of multifactorial respiratory failure with both enzymatic and radiographic evidence of congestive heart failure during his recent hospitalization.  I have personally reviewed his echo study which confirmed severe aortic stenosis with heavy calcification of the aortic valve leaflets.  The right coronary leaflet is nearly fixed.  The 2D imaging is difficult because of poor parasternal acoustic windows.  Peak and mean transaortic valve gradients are 63 and 37 mmHg respectively.  Aortic valve area is 0.8 cm, peak systolic velocity is 4 m/s, and dimensionless index is 0.18. These findings are clearly consistent with severe aortic stenosis.  LV function is preserved with an LVEF of 55 to 60%.  There is severe mitral annular calcification with associated mild mitral valve stenosis. I have reviewed the natural history of aortic stenosis with the patient and their family members who are present today. We have discussed the limitations of medical therapy and the poor prognosis associated with symptomatic aortic stenosis. We have reviewed potential treatment options, including palliative medical therapy, conventional surgical aortic valve replacement, and transcatheter aortic valve replacement. We discussed treatment options in the context of the patient's specific comorbid medical conditions.  Considering the patient's advanced age and comorbid conditions, it is highly unlikely that he would be a surgical candidate.  TAVR might be a reasonable treatment option for this patient with severe symptomatic aortic stenosis.  We reviewed the TAVR  procedure today and I discussed risks, indications, and potential benefits with the patient and his son.  They understand that he will require further evaluation with right and left heart catheterization to assess hemodynamics and coronary anatomy. I have reviewed the risks, indications, and alternatives to cardiac catheterization, possible angioplasty, and stenting with the patient. Risks include but are not limited to bleeding, infection, vascular injury, stroke, myocardial infection, arrhythmia, kidney injury, radiation-related injury in the case of prolonged fluoroscopy use, emergency cardiac surgery, and death. The patient understands the risks of serious complication is 1-2 in 1657 with diagnostic cardiac cath and 1-2% or less with angioplasty/stenting.  The patient will also undergo a gated CTA of the heart and the chest, abdomen, and pelvis to assess anatomic feasibility of TAVR.  Once his cardiac catheterization and CTA studies are completed, he will undergo formal cardiac surgical consultation.  All of their questions are answered today. 2. Multifactorial.  Recommend repeat chest x-ray.  Continue current medical therapy including daily oral loop diuretic. 3. Longstanding atrial fibrillation.  Continue warfarin.  Heart rate is controlled.  Patient is instructed to hold warfarin 5 days before cardiac catheterization.   Shared Decision Making/Informed Consent The risks [stroke (1 in 1000), death (1 in 1000), kidney failure [usually temporary] (1 in 500), bleeding (1 in 200), allergic reaction [possibly serious] (1 in 200)], benefits (diagnostic  support and management of coronary artery disease) and alternatives of a cardiac catheterization were discussed in detail with Ruben [name] and he is willing to proceed.  Medication Adjustments/Labs and Tests Ordered: Current medicines are reviewed at length with the patient today.  Concerns regarding medicines are outlined above.  Orders Placed This Encounter   Procedures  . CT CORONARY MORPH W/CTA COR W/SCORE W/CA W/CM &/OR WO/CM  . CT ANGIO ABDOMEN PELVIS  W &/OR WO CONTRAST  . CT ANGIO CHEST AORTA W/CM & OR WO/CM  . DG Chest 2 View  . Basic metabolic panel  . CBC with Differential/Platelet  . EKG 12-Lead  . VAS US CAROTID   No orders of the defined types were placed in this encounter.   Patient Instructions  COVID SCREENING INFORMATION (08/24/20: You are scheduled for your drive-thru COVID screening on: 08/24/2020 at 1:00PM. Pre-Procedural COVID-19 Testing Site [address] [place], [place] [address] You will need to go home after your screening and quarantine until your procedure.   CATHETERIZATION INSTRUCTIONS (08/26/20):  You are scheduled for a Cardiac Catheterization on: 08/26/2020 with Dr. Burt Knack.  1. Please arrive at the [facility] (Main Entrance A) at [facility]: [address] [place], [place] [address] at 7:00AM (This time is two hours before your procedure to ensure your preparation). Free valet parking service is available. You are allowed ONE visitor in the waiting room during your procedure. Both you and your guest must wear masks. Special note: Every effort is made to have your procedure done on time. Please understand that emergencies sometimes delay scheduled procedures.  2. Diet: Do not eat solid foods after midnight.  You may have clear liquids until 5am upon the day of the procedure.  3. Labs: TODAY! BMET, CBC  4. Medication instructions in preparation for your procedure:  1) HOLD COUMADIN 5 days prior to your procedure  2) HOLD TORSEMIDE the morning of your cath  3) TAKE ASPIRIN 81 mg the morning of your cath  4) You may take your other medications as directed with sips of water  5. Plan for one night stay--bring personal belongings. 6. Bring a current list of your medications and current insurance cards. 7. You MUST have a responsible person to drive you home. 8. Someone MUST be with you the first  24 hours after you arrive home or your discharge will be delayed. 9. Please wear clothes that are easy to get on and off and wear slip-on shoes.  Thank you for allowing [place] to care for you!   -- [facility] Invasive Cardiovascular services     Signed, Sherren Mocha, MD  08/13/2020 8:54 AM    [place]

## 2020-08-12 NOTE — Patient Instructions (Addendum)
COVID SCREENING INFORMATION (08/24/20: You are scheduled for your drive-thru COVID screening on: 08/24/2020 at 1:00PM. Pre-Procedural COVID-19 Testing Site [address] [place], [place] [address] You will need to go home after your screening and quarantine until your procedure.   CATHETERIZATION INSTRUCTIONS (08/26/20):  You are scheduled for a Cardiac Catheterization on: 08/26/2020 with Dr. Excell Seltzer.  1. Please arrive at the [facility] (Main Entrance A) at [facility]: [address] [place], [place] [address] at 7:00AM (This time is two hours before your procedure to ensure your preparation). Free valet parking service is available. You are allowed ONE visitor in the waiting room during your procedure. Both you and your guest must wear masks. Special note: Every effort is made to have your procedure done on time. Please understand that emergencies sometimes delay scheduled procedures.  2. Diet: Do not eat solid foods after midnight.  You may have clear liquids until 5am upon the day of the procedure.  3. Labs: TODAY! BMET, CBC  4. Medication instructions in preparation for your procedure:  1) HOLD COUMADIN 5 days prior to your procedure  2) HOLD TORSEMIDE the morning of your cath  3) TAKE ASPIRIN 81 mg the morning of your cath  4) You may take your other medications as directed with sips of water  5. Plan for one night stay--bring personal belongings. 6. Bring a current list of your medications and current insurance cards. 7. You MUST have a responsible person to drive you home. 8. Someone MUST be with you the first 24 hours after you arrive home or your discharge will be delayed. 9. Please wear clothes that are easy to get on and off and wear slip-on shoes.  Thank you for allowing [place] to care for you!   --  Invasive Cardiovascular services

## 2020-08-13 ENCOUNTER — Encounter: Payer: Self-pay | Admitting: Cardiovascular Disease

## 2020-08-13 LAB — CBC WITH DIFFERENTIAL/PLATELET
Basophils Absolute: 0.1 10*3/uL (ref 0.0–0.2)
Basos: 1 %
EOS (ABSOLUTE): 0.1 10*3/uL (ref 0.0–0.4)
Eos: 1 %
Hematocrit: 39 % (ref 37.5–51.0)
Hemoglobin: 13.1 g/dL (ref 13.0–17.7)
Immature Grans (Abs): 0 10*3/uL (ref 0.0–0.1)
Immature Granulocytes: 0 %
Lymphocytes Absolute: 0.9 10*3/uL (ref 0.7–3.1)
Lymphs: 13 %
MCH: 32.6 pg (ref 26.6–33.0)
MCHC: 33.6 g/dL (ref 31.5–35.7)
MCV: 97 fL (ref 79–97)
Monocytes Absolute: 0.5 10*3/uL (ref 0.1–0.9)
Monocytes: 8 %
Neutrophils Absolute: 5.3 10*3/uL (ref 1.4–7.0)
Neutrophils: 77 %
Platelets: 198 10*3/uL (ref 150–450)
RBC: 4.02 x10E6/uL — ABNORMAL LOW (ref 4.14–5.80)
RDW: 13.7 % (ref 11.6–15.4)
WBC: 6.9 10*3/uL (ref 3.4–10.8)

## 2020-08-13 LAB — BASIC METABOLIC PANEL
BUN/Creatinine Ratio: 26 — ABNORMAL HIGH (ref 10–24)
BUN: 29 mg/dL (ref 10–36)
CO2: 26 mmol/L (ref 20–29)
Calcium: 8.8 mg/dL (ref 8.6–10.2)
Chloride: 105 mmol/L (ref 96–106)
Creatinine, Ser: 1.12 mg/dL (ref 0.76–1.27)
GFR calc Af Amer: 66 mL/min/{1.73_m2} (ref >59)
GFR calc non Af Amer: 57 mL/min/{1.73_m2} — ABNORMAL LOW (ref >59)
Glucose: 95 mg/dL (ref 65–99)
Potassium: 4.6 mmol/L (ref 3.5–5.2)
Sodium: 146 mmol/L — ABNORMAL HIGH (ref 134–144)

## 2020-08-15 ENCOUNTER — Telehealth: Payer: Self-pay | Admitting: Cardiovascular Disease

## 2020-08-15 NOTE — Telephone Encounter (Signed)
Patient is returning Katy's call. Please advise.

## 2020-08-15 NOTE — Telephone Encounter (Signed)
Mr. Mears, the patient's son and caregiver, reports the patient's SOB has progressed some and would like the catheterization done sooner.  Rescheduled R/LHC to 1/19 per request. Rescheduled Covid test to 1/17. He understands to HOLD COUMADIN 5 days prior. He understands Dr. Quita Skye office will call to schedule an appropriate Coumadin check.  He is taking the patient to [facility] at [facility] tomorrow for CXR.  He was grateful for call and agrees with plan.   Dr. Quita Skye office notified of plan (he manages Coumadin). They will call the patient to schedule INR check week after cath.

## 2020-08-16 ENCOUNTER — Ambulatory Visit
Admission: RE | Admit: 2020-08-16 | Discharge: 2020-08-16 | Disposition: A | Discharge status: Home / Self Care | Payer: Medicare Other | Source: Ambulatory Visit | Attending: Cardiovascular Disease | Admitting: Cardiovascular Disease

## 2020-08-16 DIAGNOSIS — R0602 Shortness of breath: Secondary | ICD-10-CM

## 2020-08-20 ENCOUNTER — Inpatient Hospital Stay (HOSPITAL_COMMUNITY): Admission: RE | Admit: 2020-08-20 | Payer: Medicare Other | Source: Ambulatory Visit

## 2020-08-22 ENCOUNTER — Other Ambulatory Visit (HOSPITAL_COMMUNITY): Payer: Medicare Other

## 2020-08-22 ENCOUNTER — Other Ambulatory Visit (HOSPITAL_COMMUNITY)
Admission: RE | Admit: 2020-08-22 | Discharge: 2020-08-22 | Disposition: A | Discharge status: Home / Self Care | Payer: Medicare Other | Source: Ambulatory Visit | Attending: Cardiovascular Disease | Admitting: Cardiovascular Disease

## 2020-08-22 ENCOUNTER — Telehealth: Payer: Self-pay | Admitting: *Deleted

## 2020-08-22 DIAGNOSIS — Z20822 Contact with and (suspected) exposure to covid-19: Secondary | ICD-10-CM | POA: Diagnosis not present

## 2020-08-22 DIAGNOSIS — Z01812 Encounter for preprocedural laboratory examination: Secondary | ICD-10-CM | POA: Diagnosis present

## 2020-08-22 LAB — SARS CORONAVIRUS 2 (TAT 6-24 HRS): SARS Coronavirus 2: NEGATIVE

## 2020-08-22 NOTE — Telephone Encounter (Signed)
Pt contacted pre-catheterization scheduled at [facility] for: Wednesday August 24, 2020 10 AM Verified arrival time and place: [facility] Main Entrance A [facility]) at: 8 AM   No solid food after midnight prior to cath, clear liquids until 5 AM day of procedure.  Hold: Coumadin-none 08/19/20 until post procedure Torsemide/KCl-day before and day of procedure-GFR 57  Except hold medications AM meds can be  taken pre-cath with sips of water including: ASA 81 mg   Confirmed patient has responsible adult to drive home post procedure and be with patient first 24 hours after arriving home: yes  You are allowed ONE visitor in the waiting room during the time you are at the hospital for your procedure. Both you and your visitor must wear a mask once you enter the hospital.   Reviewed procedure/mask/visitor instructions with patient's son, [place] (with patient's verbal permission).

## 2020-08-24 ENCOUNTER — Ambulatory Visit (HOSPITAL_COMMUNITY)
Admission: RE | Admit: 2020-08-24 | Discharge: 2020-08-24 | Disposition: A | Discharge status: Home / Self Care | Payer: Medicare Other | Attending: Cardiovascular Disease | Admitting: Cardiovascular Disease

## 2020-08-24 ENCOUNTER — Encounter (HOSPITAL_COMMUNITY)
Admission: RE | Disposition: A | Discharge status: Home / Self Care | Payer: Self-pay | Source: Home / Self Care | Attending: Cardiovascular Disease

## 2020-08-24 ENCOUNTER — Other Ambulatory Visit (HOSPITAL_COMMUNITY): Payer: Medicare Other

## 2020-08-24 ENCOUNTER — Other Ambulatory Visit: Payer: Self-pay

## 2020-08-24 ENCOUNTER — Encounter (HOSPITAL_COMMUNITY): Payer: Self-pay | Admitting: Cardiovascular Disease

## 2020-08-24 DIAGNOSIS — I350 Nonrheumatic aortic (valve) stenosis: Secondary | ICD-10-CM | POA: Diagnosis not present

## 2020-08-24 DIAGNOSIS — I2510 Atherosclerotic heart disease of native coronary artery without angina pectoris: Secondary | ICD-10-CM | POA: Diagnosis not present

## 2020-08-24 DIAGNOSIS — Z7901 Long term (current) use of anticoagulants: Secondary | ICD-10-CM | POA: Insufficient documentation

## 2020-08-24 DIAGNOSIS — R0602 Shortness of breath: Secondary | ICD-10-CM | POA: Diagnosis not present

## 2020-08-24 DIAGNOSIS — Z79899 Other long term (current) drug therapy: Secondary | ICD-10-CM | POA: Insufficient documentation

## 2020-08-24 DIAGNOSIS — Z87891 Personal history of nicotine dependence: Secondary | ICD-10-CM | POA: Diagnosis not present

## 2020-08-24 DIAGNOSIS — I4821 Permanent atrial fibrillation: Secondary | ICD-10-CM | POA: Insufficient documentation

## 2020-08-24 DIAGNOSIS — I080 Rheumatic disorders of both mitral and aortic valves: Secondary | ICD-10-CM | POA: Insufficient documentation

## 2020-08-24 HISTORY — PX: RIGHT/LEFT HEART CATH AND CORONARY ANGIOGRAPHY: CATH118266

## 2020-08-24 LAB — POCT I-STAT 7, (LYTES, BLD GAS, ICA,H+H)
Acid-Base Excess: 2 mmol/L (ref 0.0–2.0)
Bicarbonate: 28 mmol/L (ref 20.0–28.0)
Calcium, Ion: 1.22 mmol/L (ref 1.15–1.40)
HCT: 34 % — ABNORMAL LOW (ref 39.0–52.0)
Hemoglobin: 11.6 g/dL — ABNORMAL LOW (ref 13.0–17.0)
O2 Saturation: 95 %
Potassium: 4.5 mmol/L (ref 3.5–5.1)
Sodium: 140 mmol/L (ref 135–145)
TCO2: 30 mmol/L (ref 22–32)
pCO2 arterial: 51 mmHg — ABNORMAL HIGH (ref 32.0–48.0)
pH, Arterial: 7.348 — ABNORMAL LOW (ref 7.350–7.450)
pO2, Arterial: 81 mmHg — ABNORMAL LOW (ref 83.0–108.0)

## 2020-08-24 LAB — POCT I-STAT EG7
Acid-Base Excess: 3 mmol/L — ABNORMAL HIGH (ref 0.0–2.0)
Bicarbonate: 29.5 mmol/L — ABNORMAL HIGH (ref 20.0–28.0)
Calcium, Ion: 1.25 mmol/L (ref 1.15–1.40)
HCT: 35 % — ABNORMAL LOW (ref 39.0–52.0)
Hemoglobin: 11.9 g/dL — ABNORMAL LOW (ref 13.0–17.0)
O2 Saturation: 67 %
Potassium: 4.6 mmol/L (ref 3.5–5.1)
Sodium: 140 mmol/L (ref 135–145)
TCO2: 31 mmol/L (ref 22–32)
pCO2, Ven: 53.4 mmHg (ref 44.0–60.0)
pH, Ven: 7.35 (ref 7.250–7.430)
pO2, Ven: 37 mmHg (ref 32.0–45.0)

## 2020-08-24 LAB — PROTIME-INR
INR: 1.2 (ref 0.8–1.2)
Prothrombin Time: 14.4 seconds (ref 11.4–15.2)

## 2020-08-24 SURGERY — RIGHT/LEFT HEART CATH AND CORONARY ANGIOGRAPHY
Anesthesia: LOCAL

## 2020-08-24 MED ORDER — ASPIRIN 81 MG PO CHEW: 81.0000 mg | CHEWABLE_TABLET | ORAL | Status: DC

## 2020-08-24 MED ORDER — HYDRALAZINE HCL 20 MG/ML IJ SOLN: 10.0000 mg | INTRAMUSCULAR | Status: DC | PRN

## 2020-08-24 MED ORDER — SODIUM CHLORIDE 0.9% FLUSH: 3.0000 mL | INTRAVENOUS | Status: DC | PRN

## 2020-08-24 MED ORDER — SODIUM CHLORIDE 0.9 % IV SOLN: 250.0000 mL | INTRAVENOUS | Status: DC | PRN

## 2020-08-24 MED ORDER — HEPARIN SODIUM (PORCINE) 1000 UNIT/ML IJ SOLN
INTRAMUSCULAR | Status: DC | PRN
  Administered 2020-08-24: 5000 [IU] via INTRAVENOUS

## 2020-08-24 MED ORDER — SODIUM CHLORIDE 0.9 % WEIGHT BASED INFUSION
3.0000 mL/kg/h | INTRAVENOUS | Status: AC
  Administered 2020-08-24: 3 mL/kg/h via INTRAVENOUS

## 2020-08-24 MED ORDER — SODIUM CHLORIDE 0.9% FLUSH: 3.0000 mL | Freq: Two times a day (BID) | INTRAVENOUS | Status: DC

## 2020-08-24 MED ORDER — LABETALOL HCL 5 MG/ML IV SOLN
10.0000 mg | INTRAVENOUS | Status: DC | PRN
Start: 2020-08-24 — End: 2020-08-24

## 2020-08-24 MED ORDER — MIDAZOLAM HCL 2 MG/2ML IJ SOLN
INTRAMUSCULAR | Status: AC
  Filled 2020-08-24: qty 2

## 2020-08-24 MED ORDER — SODIUM CHLORIDE 0.9 % IV SOLN: INTRAVENOUS | Status: DC

## 2020-08-24 MED ORDER — IOHEXOL 350 MG/ML SOLN
INTRAVENOUS | Status: DC | PRN
  Administered 2020-08-24: 55 mL

## 2020-08-24 MED ORDER — ONDANSETRON HCL 4 MG/2ML IJ SOLN: 4.0000 mg | Freq: Four times a day (QID) | INTRAMUSCULAR | Status: DC | PRN

## 2020-08-24 MED ORDER — SODIUM CHLORIDE 0.9 % WEIGHT BASED INFUSION: 1.0000 mL/kg/h | INTRAVENOUS | Status: DC

## 2020-08-24 MED ORDER — ACETAMINOPHEN 325 MG PO TABS: 650.0000 mg | ORAL_TABLET | ORAL | Status: DC | PRN

## 2020-08-24 MED ORDER — VERAPAMIL HCL 2.5 MG/ML IV SOLN
INTRAVENOUS | Status: DC | PRN
  Administered 2020-08-24: 10 mL via INTRA_ARTERIAL

## 2020-08-24 MED ORDER — VERAPAMIL HCL 2.5 MG/ML IV SOLN
INTRAVENOUS | Status: AC
  Filled 2020-08-24: qty 2

## 2020-08-24 MED ORDER — HEPARIN SODIUM (PORCINE) 1000 UNIT/ML IJ SOLN
INTRAMUSCULAR | Status: AC
  Filled 2020-08-24: qty 1

## 2020-08-24 MED ORDER — LIDOCAINE HCL (PF) 1 % IJ SOLN
INTRAMUSCULAR | Status: AC
  Filled 2020-08-24: qty 30

## 2020-08-24 MED ORDER — LIDOCAINE HCL (PF) 1 % IJ SOLN
INTRAMUSCULAR | Status: DC | PRN
  Administered 2020-08-24 (×2): 2 mL

## 2020-08-24 MED ORDER — MIDAZOLAM HCL 2 MG/2ML IJ SOLN
INTRAMUSCULAR | Status: DC | PRN
  Administered 2020-08-24: 1 mg via INTRAVENOUS

## 2020-08-24 MED ORDER — HEPARIN (PORCINE) IN NACL 1000-0.9 UT/500ML-% IV SOLN
INTRAVENOUS | Status: AC
  Filled 2020-08-24: qty 1000

## 2020-08-24 MED ORDER — HEPARIN (PORCINE) IN NACL 1000-0.9 UT/500ML-% IV SOLN
INTRAVENOUS | Status: DC | PRN
  Administered 2020-08-24 (×2): 500 mL

## 2020-08-24 SURGICAL SUPPLY — 17 items
BAG SNAP BAND KOVER 36X36 (MISCELLANEOUS) ×2 IMPLANT
CATH 5FR JL3.5 JR4 ANG PIG MP (CATHETERS) ×2 IMPLANT
CATH BALLN WEDGE 5F 110CM (CATHETERS) ×2 IMPLANT
CATH INFINITI 5 FR AL2 (CATHETERS) ×2 IMPLANT
COVER DOME SNAP 22 D (MISCELLANEOUS) ×2 IMPLANT
DEVICE RAD COMP TR BAND LRG (VASCULAR PRODUCTS) ×2 IMPLANT
GLIDESHEATH SLEND SS 6F .021 (SHEATH) ×2 IMPLANT
GUIDEWIRE .025 260CM (WIRE) ×2 IMPLANT
GUIDEWIRE INQWIRE 1.5J.035X260 (WIRE) ×2 IMPLANT
INQWIRE 1.5J .035X260CM (WIRE) ×4
KIT HEART LEFT (KITS) ×2 IMPLANT
PACK CARDIAC CATHETERIZATION (CUSTOM PROCEDURE TRAY) ×2 IMPLANT
SHEATH GLIDE SLENDER 4/5FR (SHEATH) ×2 IMPLANT
SHEATH PROBE COVER 6X72 (BAG) ×2 IMPLANT
TRANSDUCER W/STOPCOCK (MISCELLANEOUS) ×2 IMPLANT
TUBING CIL FLEX 10 FLL-RA (TUBING) ×2 IMPLANT
WIRE EMERALD ST .035X150CM (WIRE) ×2 IMPLANT

## 2020-08-24 NOTE — Interval H&P Note (Signed)
History and Physical Interval Note:  08/24/2020 11:08 AM  Ruben [name]  has presented today for surgery, with the diagnosis of aortic stenosis.  The various methods of treatment have been discussed with the patient and family. After consideration of risks, benefits and other options for treatment, the patient has consented to  Procedure(s): RIGHT/LEFT HEART CATH AND CORONARY ANGIOGRAPHY (N/A) as a surgical intervention.  The patient's history has been reviewed, patient examined, no change in status, stable for surgery.  I have reviewed the patient's chart and labs.  Questions were answered to the patient's satisfaction.     Tonny Bollman

## 2020-08-24 NOTE — Discharge Instructions (Signed)
Radial Site Care  This sheet gives you information about how to care for yourself after your procedure. Your health care provider may also give you more specific instructions. If you have problems or questions, contact your health care provider. What can I expect after the procedure? After the procedure, it is common to have:  Bruising and tenderness at the catheter insertion area. Follow these instructions at home: Medicines  Take over-the-counter and prescription medicines only as told by your health care provider. Insertion site care  Follow instructions from your health care provider about how to take care of your insertion site. Make sure you: ? Wash your hands with soap and water before you change your bandage (dressing). If soap and water are not available, use hand sanitizer. ? Change your dressing as told by your health care provider. ? Leave stitches (sutures), skin glue, or adhesive strips in place. These skin closures may need to stay in place for 2 weeks or longer. If adhesive strip edges start to loosen and curl up, you may trim the loose edges. Do not remove adhesive strips completely unless your health care provider tells you to do that.  Check your insertion site every day for signs of infection. Check for: ? Redness, swelling, or pain. ? Fluid or blood. ? Pus or a bad smell. ? Warmth.  Do not take baths, swim, or use a hot tub until your health care provider approves.  You may shower 24-48 hours after the procedure, or as directed by your health care provider. ? Remove the dressing and gently wash the site with plain soap and water. ? Pat the area dry with a clean towel. ? Do not rub the site. That could cause bleeding.  Do not apply powder or lotion to the site. Activity  For 24 hours after the procedure, or as directed by your health care provider: ? Do not flex or bend the affected arm. ? Do not push or pull heavy objects with the affected arm. ? Do not drive  yourself home from the hospital or clinic. You may drive 24 hours after the procedure unless your health care provider tells you not to. ? Do not operate machinery or power tools.  Do not lift anything that is heavier than 10 lb (4.5 kg), or the limit that you are told, until your health care provider says that it is safe.  Ask your health care provider when it is okay to: ? Return to work or school. ? Resume usual physical activities or sports. ? Resume sexual activity.   General instructions  If the catheter site starts to bleed, raise your arm and put firm pressure on the site. If the bleeding does not stop, get help right away. This is a medical emergency.  If you went home on the same day as your procedure, a responsible adult should be with you for the first 24 hours after you arrive home.  Keep all follow-up visits as told by your health care provider. This is important. Contact a health care provider if:  You have a fever.  You have redness, swelling, or yellow drainage around your insertion site. Get help right away if:  You have unusual pain at the radial site.  The catheter insertion area swells very fast.  The insertion area is bleeding, and the bleeding does not stop when you hold steady pressure on the area.  Your arm or hand becomes pale, cool, tingly, or numb. These symptoms may represent a serious   problem that is an emergency. Do not wait to see if the symptoms will go away. Get medical help right away. Call your local emergency services (911 in the [place] Do not drive yourself to the hospital. Summary  After the procedure, it is common to have bruising and tenderness at the site.  Follow instructions from your health care provider about how to take care of your radial site wound. Check the wound every day for signs of infection.  Do not lift anything that is heavier than 10 lb (4.5 kg), or the limit that you are told, until your health care provider says that it  is safe. This information is not intended to replace advice given to you by your health care provider. Make sure you discuss any questions you have with your health care provider. Document Revised: 08/28/2017 Document Reviewed: 08/28/2017 Elsevier Patient Education  2021 [employer]vier Inc.  

## 2020-08-25 ENCOUNTER — Encounter: Payer: Self-pay | Admitting: Physician Assistant

## 2020-08-30 ENCOUNTER — Other Ambulatory Visit: Payer: Self-pay

## 2020-08-30 ENCOUNTER — Ambulatory Visit (HOSPITAL_BASED_OUTPATIENT_CLINIC_OR_DEPARTMENT_OTHER)
Admit: 2020-08-30 | Discharge: 2020-08-30 | Disposition: A | Discharge status: Home / Self Care | Payer: Medicare Other | Attending: Cardiovascular Disease | Admitting: Cardiovascular Disease

## 2020-08-30 ENCOUNTER — Encounter: Payer: Self-pay | Admitting: Physician Assistant

## 2020-08-30 ENCOUNTER — Ambulatory Visit (HOSPITAL_COMMUNITY): Payer: Medicare Other

## 2020-08-30 ENCOUNTER — Ambulatory Visit (HOSPITAL_COMMUNITY)
Admission: RE | Admit: 2020-08-30 | Discharge: 2020-08-30 | Disposition: A | Discharge status: Home / Self Care | Payer: Medicare Other | Source: Ambulatory Visit | Attending: Cardiovascular Disease | Admitting: Cardiovascular Disease

## 2020-08-30 DIAGNOSIS — I350 Nonrheumatic aortic (valve) stenosis: Secondary | ICD-10-CM

## 2020-08-30 DIAGNOSIS — J449 Chronic obstructive pulmonary disease, unspecified: Secondary | ICD-10-CM | POA: Insufficient documentation

## 2020-08-30 MED ORDER — IOHEXOL 350 MG/ML SOLN
95.0000 mL | Freq: Once | INTRAVENOUS | Status: AC | PRN
  Administered 2020-08-30: 95 mL via INTRAVENOUS

## 2020-08-31 ENCOUNTER — Other Ambulatory Visit: Payer: Self-pay | Admitting: Physician Assistant

## 2020-08-31 ENCOUNTER — Encounter: Payer: Self-pay | Admitting: Physician Assistant

## 2020-08-31 DIAGNOSIS — I340 Nonrheumatic mitral (valve) insufficiency: Secondary | ICD-10-CM

## 2020-08-31 DIAGNOSIS — I350 Nonrheumatic aortic (valve) stenosis: Secondary | ICD-10-CM

## 2020-09-05 ENCOUNTER — Other Ambulatory Visit (HOSPITAL_COMMUNITY)
Admission: RE | Admit: 2020-09-05 | Discharge: 2020-09-05 | Disposition: A | Discharge status: Home / Self Care | Payer: Medicare Other | Source: Ambulatory Visit | Attending: Physician Assistant | Admitting: Physician Assistant

## 2020-09-05 ENCOUNTER — Other Ambulatory Visit: Payer: Self-pay

## 2020-09-05 DIAGNOSIS — Z20822 Contact with and (suspected) exposure to covid-19: Secondary | ICD-10-CM | POA: Insufficient documentation

## 2020-09-05 DIAGNOSIS — Z01812 Encounter for preprocedural laboratory examination: Secondary | ICD-10-CM | POA: Diagnosis present

## 2020-09-05 LAB — SARS CORONAVIRUS 2 (TAT 6-24 HRS): SARS Coronavirus 2: NEGATIVE

## 2020-09-07 ENCOUNTER — Ambulatory Visit (HOSPITAL_BASED_OUTPATIENT_CLINIC_OR_DEPARTMENT_OTHER)
Admission: RE | Admit: 2020-09-07 | Discharge: 2020-09-07 | Disposition: A | Discharge status: Home / Self Care | Payer: Medicare Other | Source: Ambulatory Visit | Attending: Physician Assistant | Admitting: Physician Assistant

## 2020-09-07 ENCOUNTER — Ambulatory Visit (HOSPITAL_COMMUNITY): Payer: Medicare Other | Admitting: Certified Registered Nurse Anesthetist

## 2020-09-07 ENCOUNTER — Encounter (HOSPITAL_COMMUNITY): Payer: Self-pay | Admitting: Cardiovascular Disease

## 2020-09-07 ENCOUNTER — Encounter (HOSPITAL_COMMUNITY)
Admission: RE | Disposition: A | Discharge status: Home / Self Care | Payer: Self-pay | Source: Home / Self Care | Attending: Cardiovascular Disease

## 2020-09-07 ENCOUNTER — Ambulatory Visit (HOSPITAL_COMMUNITY)
Admission: RE | Admit: 2020-09-07 | Discharge: 2020-09-07 | Disposition: A | Discharge status: Home / Self Care | Payer: Medicare Other | Attending: Cardiovascular Disease | Admitting: Cardiovascular Disease

## 2020-09-07 DIAGNOSIS — I340 Nonrheumatic mitral (valve) insufficiency: Secondary | ICD-10-CM

## 2020-09-07 DIAGNOSIS — I350 Nonrheumatic aortic (valve) stenosis: Secondary | ICD-10-CM | POA: Diagnosis not present

## 2020-09-07 DIAGNOSIS — Z87891 Personal history of nicotine dependence: Secondary | ICD-10-CM | POA: Insufficient documentation

## 2020-09-07 DIAGNOSIS — I4821 Permanent atrial fibrillation: Secondary | ICD-10-CM | POA: Diagnosis not present

## 2020-09-07 DIAGNOSIS — I083 Combined rheumatic disorders of mitral, aortic and tricuspid valves: Secondary | ICD-10-CM | POA: Diagnosis not present

## 2020-09-07 DIAGNOSIS — Z7901 Long term (current) use of anticoagulants: Secondary | ICD-10-CM | POA: Diagnosis not present

## 2020-09-07 DIAGNOSIS — Z79899 Other long term (current) drug therapy: Secondary | ICD-10-CM | POA: Diagnosis not present

## 2020-09-07 HISTORY — PX: TEE WITHOUT CARDIOVERSION: SHX5443

## 2020-09-07 HISTORY — PX: BUBBLE STUDY: SHX6837

## 2020-09-07 LAB — ECHO TEE
AR max vel: 0.61 cm2
AV Area VTI: 0.52 cm2
AV Area mean vel: 0.59 cm2
AV Mean grad: 38.1 mmHg
AV Peak grad: 62.1 mmHg
Ao pk vel: 3.94 m/s
MV M vel: 3.4 m/s
MV Peak grad: 46.2 mmHg
MV VTI: 1.45 cm2
Radius: 1.2 cm

## 2020-09-07 SURGERY — ECHOCARDIOGRAM, TRANSESOPHAGEAL
Anesthesia: Monitor Anesthesia Care

## 2020-09-07 MED ORDER — PHENYLEPHRINE 40 MCG/ML (10ML) SYRINGE FOR IV PUSH (FOR BLOOD PRESSURE SUPPORT)
PREFILLED_SYRINGE | INTRAVENOUS | Status: DC | PRN
  Administered 2020-09-07 (×2): 80 ug via INTRAVENOUS

## 2020-09-07 MED ORDER — PHENYLEPHRINE HCL-NACL 10-0.9 MG/250ML-% IV SOLN
INTRAVENOUS | Status: DC | PRN
  Administered 2020-09-07: 25 ug/min via INTRAVENOUS

## 2020-09-07 MED ORDER — SODIUM CHLORIDE 0.9 % IV SOLN: INTRAVENOUS | Status: DC

## 2020-09-07 MED ORDER — LACTATED RINGERS IV SOLN: INTRAVENOUS | Status: DC | PRN

## 2020-09-07 MED ORDER — BUTAMBEN-TETRACAINE-BENZOCAINE 2-2-14 % EX AERO
INHALATION_SPRAY | CUTANEOUS | Status: DC | PRN
  Administered 2020-09-07: 2 via TOPICAL

## 2020-09-07 MED ORDER — PROPOFOL 500 MG/50ML IV EMUL
INTRAVENOUS | Status: DC | PRN
  Administered 2020-09-07: 65 ug/kg/min via INTRAVENOUS
  Administered 2020-09-07: 75 ug/kg/min via INTRAVENOUS

## 2020-09-07 MED ORDER — PROPOFOL 10 MG/ML IV BOLUS
INTRAVENOUS | Status: DC | PRN
  Administered 2020-09-07 (×3): 10 mg via INTRAVENOUS

## 2020-09-07 NOTE — Progress Notes (Signed)
  Echocardiogram Echocardiogram Transesophageal has been performed.  Ruben [name] Ruben [name] 09/07/2020, 1:43 PM

## 2020-09-07 NOTE — CV Procedure (Signed)
    TRANSESOPHAGEAL ECHOCARDIOGRAM   NAME:  Ruben [name]    MRN: [id number] DOB:  Aug 30, 1926    ADMIT DATE: 09/07/2020  INDICATIONS: Severe MR  PROCEDURE:   Informed consent was obtained prior to the procedure. The risks, benefits and alternatives for the procedure were discussed and the patient comprehended these risks.  Risks include, but are not limited to, cough, sore throat, vomiting, nausea, somnolence, esophageal and stomach trauma or perforation, bleeding, low blood pressure, aspiration, pneumonia, infection, trauma to the teeth and death.    Procedural time out performed. The oropharynx was anesthetized with topical 1% benzocaine.    Anesthesia was administered by Dr. Fransisco Beau.  The patient was administered a total of 320 mg propofol and 30 mg lidocaine achieve and maintain moderate conscious sedation.  The patient's heart rate, blood pressure, and oxygen saturation are monitored continuously during the procedure. The period of conscious sedation is 40 minutes, of which I was present face-to-face 100% of this time.   The transesophageal probe was inserted in the esophagus and stomach without difficulty and multiple views were obtained.   COMPLICATIONS:    There were no immediate complications.  KEY FINDINGS:  1. Flail P2 segment with moderate to severe MR (quantitation to follow). 2. Severe MAC. 3. Severe AS.  4. Moderate to severe TR.  5. Full report to follow. 6. Further management per primary team.   Ruben [name]. Audie Box, [place]  [address] [place], [place] [address] [phone]  12:08 PM

## 2020-09-07 NOTE — Anesthesia Preprocedure Evaluation (Addendum)
Anesthesia Evaluation  Patient identified by MRN, date of birth, ID band Patient awake    Reviewed: Allergy & Precautions, NPO status , Patient's Chart, lab work & pertinent test results  Airway Mallampati: III  TM Distance: >3 FB Neck ROM: Full    Dental  (+) Dental Advisory Given   Pulmonary COPD, former smoker,    Pulmonary exam normal        Cardiovascular +CHF  + dysrhythmias + Valvular Problems/Murmurs AS and MR  Rhythm:Regular Rate:Normal + Systolic murmurs Echo 62/56/38 1. Left ventricular ejection fraction, by estimation, is 55 to 60%. The left ventricle has normal function. The left ventricle has no regional wall motion abnormalities. There is moderate left ventricular hypertrophy.  Left ventricular diastolic parameters are indeterminate.  2. Right ventricular systolic function is moderately reduced. The right ventricular size is mildly enlarged. There is mildly elevated pulmonary artery systolic pressure.  3. Left atrial size was moderately dilated.  4. Mild functional mitral stenosis due to severe MAC. The mitral valve is degenerative. Mild mitral valve regurgitation. Mild mitral stenosis. Severe mitral annular calcification.  5. Tricuspid valve regurgitation is moderate.  6. The aortic valve is tricuspid. There is severe calcifcation of the aortic valve. There is severe thickening of the aortic valve. Aortic valve regurgitation is not visualized. Severe aortic valve stenosis.  7. The inferior vena cava is normal in size with greater than 50% respiratory variability, suggesting right atrial pressure of 3 mmHg.    Neuro/Psych negative neurological ROS     GI/Hepatic negative GI ROS, Neg liver ROS,   Endo/Other  negative endocrine ROS  Renal/GU negative Renal ROS     Musculoskeletal  (+) Arthritis ,   Abdominal   Peds  Hematology negative hematology ROS (+)   Anesthesia Other Findings    Reproductive/Obstetrics                            Anesthesia Physical Anesthesia Plan  ASA: IV  Anesthesia Plan: MAC   Post-op Pain Management:    Induction: Intravenous  PONV Risk Score and Plan: 1 and Propofol infusion and Treatment may vary due to age or medical condition  Airway Management Planned: Natural Airway  Additional Equipment: None  Intra-op Plan:   Post-operative Plan:   Informed Consent: I have reviewed the patients History and Physical, chart, labs and discussed the procedure including the risks, benefits and alternatives for the proposed anesthesia with the patient or authorized representative who has indicated his/her understanding and acceptance.       Plan Discussed with: CRNA and Anesthesiologist  Anesthesia Plan Comments:        Anesthesia Quick Evaluation

## 2020-09-07 NOTE — Interval H&P Note (Signed)
History and Physical Interval Note:  09/07/2020 10:52 AM  Ruben [name]  has presented today for surgery, with the diagnosis of MITRAL REGURGITATION.  The various methods of treatment have been discussed with the patient and family. After consideration of risks, benefits and other options for treatment, the patient has consented to  Procedure(s): TRANSESOPHAGEAL ECHOCARDIOGRAM (TEE) (N/A) as a surgical intervention.  The patient's history has been reviewed, patient examined, no change in status, stable for surgery.  I have reviewed the patient's chart and labs.  Questions were answered to the patient's satisfaction.    NPO for TEE for severe MR. Flail P2 on CTA. 2 distinct murmurs on exam.   Gerri Spore T. Flora Lipps, MD, [facility]  [facility]  [address], [address] [place], [place] [address] [phone]  10:52 AM

## 2020-09-07 NOTE — Anesthesia Procedure Notes (Signed)
Procedure Name: MAC Date/Time: 09/07/2020 11:10 AM Performed by: Harden Mo, CRNA Pre-anesthesia Checklist: Patient identified, Emergency Drugs available, Suction available and Patient being monitored Patient Re-evaluated:Patient Re-evaluated prior to induction Oxygen Delivery Method: Nasal cannula Preoxygenation: Pre-oxygenation with 100% oxygen Induction Type: IV induction Placement Confirmation: positive ETCO2 and breath sounds checked- equal and bilateral Dental Injury: Teeth and Oropharynx as per pre-operative assessment

## 2020-09-07 NOTE — Transfer of Care (Signed)
Immediate Anesthesia Transfer of Care Note  Patient: Ruben [name]  Procedure(s) Performed: TRANSESOPHAGEAL ECHOCARDIOGRAM (TEE) (N/A ) BUBBLE STUDY  Patient Location: Endoscopy Unit  Anesthesia Type:MAC  Level of Consciousness: awake and alert   Airway & Oxygen Therapy: Patient Spontanous Breathing and Patient connected to nasal cannula oxygen  Post-op Assessment: Report given to RN, Post -op Vital signs reviewed and stable and Patient moving all extremities X 4  Post vital signs: Reviewed and stable  Last Vitals:  Vitals Value Taken Time  BP 102/60 09/07/20 1212  Temp    Pulse 61 09/07/20 1213  Resp 23 09/07/20 1213  SpO2 94 % 09/07/20 1213  Vitals shown include unvalidated device data.  Last Pain:  Vitals:   09/07/20 1027  TempSrc: Oral  PainSc: 0-No pain         Complications: No complications documented.

## 2020-09-07 NOTE — Discharge Instructions (Signed)
Transesophageal Echocardiogram Transesophageal echocardiogram (TEE) is a test that uses sound waves to take pictures of your heart. TEE is done by passing a small probe attached to a flexible tube down the part of the body that moves food from your mouth to your stomach (esophagus). The pictures give clear images of your heart. This can help your doctor see if there are problems with your heart. Tell a doctor about:  Any allergies you have.  All medicines you are taking. This includes vitamins, herbs, eye drops, creams, and over-the-counter medicines.  Any problems you or family members have had with anesthetic medicines.  Any blood disorders you have.  Any surgeries you have had.  Any medical conditions you have.  Any swallowing problems.  Whether you have or have had a blockage in the part of the body that moves food from your mouth to your stomach.  Whether you are pregnant or may be pregnant. What are the risks? In general, this is a safe procedure. But, problems may occur, such as:  Damage to nearby structures or organs.  A tear in the part of the body that moves food from your mouth to your stomach.  Irregular heartbeat.  Hoarse voice or trouble swallowing.  Bleeding. What happens before the procedure? Medicines  Ask your doctor about changing or stopping: ? Your normal medicines. ? Vitamins, herbs, and supplements. ? Over-the-counter medicines.  Do not take aspirin or ibuprofen unless you are told to. General instructions  Follow instructions from your doctor about what you cannot eat or drink.  You will take out any dentures or dental retainers.  Plan to have a responsible adult take you home from the hospital or clinic.  Plan to have a responsible adult care for you for the time you are told after you leave the hospital or clinic. This is important. What happens during the procedure?  An IV will be put into one of your veins.  You may be given: ? A  sedative. This medicine helps you relax. ? A medicine to numb the back of your throat. This may be sprayed or gargled.  Your blood pressure, heart rate, and breathing will be watched.  You may be asked to lie on your left side.  A bite block will be placed in your mouth. This keeps you from biting the tube.  The tip of the probe will be placed into the back of your mouth.  You will be asked to swallow.  Your doctor will take pictures of your heart.  The probe and bite block will be taken out after the test is done. The procedure may vary among doctors and hospitals.   What can I expect after the procedure?  You will be monitored until you leave the hospital or clinic. This includes checking your blood pressure, heart rate, breathing rate, and blood oxygen level.  Your throat may feel sore and numb. This will get better over time. You will not be allowed to eat or drink until the numbness has gone away.  It is common to have a sore throat for a day or two.  It is up to you to get the results of your procedure. Ask how to get your results when they are ready. Follow these instructions at home:  If you were given a sedative during your procedure, do not drive or use machines until your doctor says that it is safe.  Return to your normal activities when your doctor says that it is safe.    Keep all follow-up visits. Summary  TEE is a test that uses sound waves to take pictures of your heart.  You will be given a medicine to help you relax.  Do not drive or use machines until your doctor says that it is safe. This information is not intended to replace advice given to you by your health care provider. Make sure you discuss any questions you have with your health care provider. Document Revised: 03/15/2020 Document Reviewed: 03/15/2020 Elsevier Patient Education  2021 Elsevier Inc.  

## 2020-09-07 NOTE — Anesthesia Postprocedure Evaluation (Signed)
Anesthesia Post Note  Patient: Ruben [name]  Procedure(s) Performed: TRANSESOPHAGEAL ECHOCARDIOGRAM (TEE) (N/A ) BUBBLE STUDY     Patient location during evaluation: PACU Anesthesia Type: MAC Level of consciousness: awake and alert Pain management: pain level controlled Vital Signs Assessment: post-procedure vital signs reviewed and stable Respiratory status: spontaneous breathing, nonlabored ventilation and respiratory function stable Cardiovascular status: stable and blood pressure returned to baseline Anesthetic complications: no   No complications documented.  Last Vitals:  Vitals:   09/07/20 1220 09/07/20 1230  BP: 124/61 131/89  Pulse: 78 76  Resp: (!) 21 19  Temp:    SpO2: 100% 100%    Last Pain:  Vitals:   09/07/20 1235  TempSrc:   PainSc: 0-No pain                 Audry Pili

## 2020-09-08 ENCOUNTER — Encounter (HOSPITAL_COMMUNITY): Payer: Self-pay | Admitting: Cardiovascular Disease

## 2020-09-13 ENCOUNTER — Other Ambulatory Visit: Payer: Self-pay

## 2020-09-13 ENCOUNTER — Encounter: Payer: Self-pay | Admitting: Thoracic Surgery (Cardiothoracic Vascular Surgery)

## 2020-09-13 ENCOUNTER — Institutional Professional Consult (permissible substitution) (INDEPENDENT_AMBULATORY_CARE_PROVIDER_SITE_OTHER): Payer: Medicare Other | Admitting: Thoracic Surgery (Cardiothoracic Vascular Surgery)

## 2020-09-13 VITALS — BP 147/82 | HR 71 | Temp 97.5°F | Resp 20 | Ht 66.0 in | Wt 188.0 lb

## 2020-09-13 DIAGNOSIS — I340 Nonrheumatic mitral (valve) insufficiency: Secondary | ICD-10-CM | POA: Diagnosis not present

## 2020-09-13 DIAGNOSIS — I350 Nonrheumatic aortic (valve) stenosis: Secondary | ICD-10-CM | POA: Diagnosis not present

## 2020-09-13 NOTE — Patient Instructions (Signed)
Stop taking Coumadin after you take your dose on Friday 2/11  Continue taking all other medications without change through the day before surgery.  Make sure to bring all of your medications with you when you come for your Pre-Admission Testing appointment at [facility] Short-Stay Department.  Have nothing to eat or drink after midnight the night before surgery.  On the morning of surgery do not take any medications  At your appointment for Pre-Admission Testing at the [facility] Short-Stay Department you will be asked to sign permission forms for your upcoming surgery.  By definition your signature on these forms implies that you and/or your designee provide full informed consent for your planned surgical procedure(s), that alternative treatment options have been discussed, that you understand and accept any and all potential risks, and that you have some understanding of what to expect for your post-operative convalescence.  For any major cardiac surgical procedure potential operative risks include but are not limited to at least some risk of death, stroke or other neurologic complication, myocardial infarction, congestive heart failure, respiratory failure, renal failure, bleeding requiring blood transfusion and/or reexploration, irregular heart rhythm, heart block or bradycardia requiring permanent pacemaker, pneumonia, pericardial effusion, pleural effusion, wound infection, pulmonary embolus or other thromboembolic complication, chronic pain, or other complications related to the specific procedure(s) performed.  For transcatheter aortic valve replacement additional risks include but are not limited to risk of paravalvular leak, valve embolization, valve thrombosis, aortic dissection, aortic rupture, ventricular septal defect or perforation, pericardial tamponade, injury of the abdominal aorta or its branches, and/or injury or occlusion of the arteries going to  your arms or legs.  Please call to schedule a follow-up appointment in our office prior to surgery if you have any unresolved questions about your planned surgical procedure, the associated risks, alternative treatment options, and/or expectations for your post-operative recovery.

## 2020-09-13 NOTE — Progress Notes (Signed)
[facility]  MULTIDISCIPLINARY HEART VALVE CLINIC  CARDIOTHORACIC SURGERY CONSULTATION REPORT  Referring Provider is O'Neal, Cassie Freer, MD PCP is Alroy Dust, L.Marlou Sa, MD  Chief Complaint  Patient presents with  . Aortic Stenosis    New patient consultation, review all studies    HPI:  Patient is a 87 year old male with history of aortic stenosis, mitral regurgitation, hypertension, COPD which has recently become oxygen dependent, longstanding persistent atrial fibrillation on warfarin anticoagulation, right bundle branch block and degenerative arthritis who has been referred for surgical consultation to discuss treatment options for management of severe symptomatic aortic stenosis.  Patient has remained functionally independent and remarkably active for a gentleman his age.  He has history of longstanding persistent atrial fibrillation of uncertain duration.  He has been anticoagulated using warfarin for many years without any history of stroke or bleeding complication.  For the last several years he has been told that he has a heart murmur and aortic stenosis.  Up until recently he has been followed by cardiologist in [place].    The patient relocated to [place] to live near his son.  In December he was hospitalized with acute hypoxic respiratory failure felt related to a combination of COPD with acute exacerbation, rhinovirus infection, and acute on chronic diastolic congestive heart failure.  Symptoms improved with antibiotics, diuresis, and nebulized bronchodilators.  Transthoracic echocardiogram performed at that time revealed normal left ventricular systolic function with severe aortic stenosis and what was initially felt to be mild mitral regurgitation.  He was ultimately discharged home on home oxygen therapy.  He was referred to the multidisciplinary heart valve clinic and has been evaluated previously by Dr. Burt Knack. Diagnostic cardiac catheterization was  performed by Dr. Burt Knack on August 24, 2020 and revealed diffuse calcific nonobstructive coronary artery disease with severe aortic stenosis and mild pulmonary hypertension.  CT angiography was performed to evaluate the feasibility of transcatheter aortic valve replacement, and after multidisciplinary review of the patient's transthoracic echocardiogram a TEE was performed by Dr. Farris Has on September 07, 2020.  TEE confirmed presence of findings consistent with severe aortic stenosis.  TEE also revealed severe mitral regurgitation with a small flail segment of the middle scallop of the posterior leaflet and an eccentric jet of mitral regurgitation with flow reversal in the pulmonary veins.  There was severe mitral annular calcification and functional anatomy of the valve did not appear amenable to transcatheter edge-to-edge repair.  Cardiothoracic surgical consultation was requested.  Patient is married and now lives locally in [place] with his wife and his son who is a Industrial/product designer at [employer].  Patient has remained functionally independent and remarkably active for his age throughout retirement.  He still drives an automobile.  He reports no problems with ambulation.  He is limited by exertional shortness of breath and fatigue.  Since hospital discharge 2 months ago he has remained clinically stable with functional class II symptoms of dyspnea.  He has not had any chest pain or chest tightness.  He denies any resting shortness of breath, PND, orthopnea, tachypalpitations, dizzy spells, or syncope.  He has had some mild lower extremity edema that is improved on oral diuretic therapy.  He currently is wearing oxygen but reports that he feels fine when the oxygen is off, although frequently oxygen saturation levels will dip below 80% when the patient is not on continuous oxygen therapy.  Past Medical History:  Diagnosis Date  . Arthritis   . CHF (congestive heart failure) ([facility])   .  COPD (chronic  obstructive pulmonary disease) ([place])   . Mitral regurgitation   . Persistent atrial fibrillation (HCC)    on coumadin   . RBBB   . Severe aortic stenosis     Past Surgical History:  Procedure Laterality Date  . BUBBLE STUDY  09/07/2020   Procedure: BUBBLE STUDY;  Surgeon: Geralynn Rile, MD;  Location: [facility];  Service: Cardiovascular;;  . RIGHT/LEFT HEART CATH AND CORONARY ANGIOGRAPHY N/A 08/24/2020   Procedure: RIGHT/LEFT HEART CATH AND CORONARY ANGIOGRAPHY;  Surgeon: Sherren Mocha, MD;  Location: [facility];  Service: Cardiovascular;  Laterality: N/A;  . TEE WITHOUT CARDIOVERSION N/A 09/07/2020   Procedure: TRANSESOPHAGEAL ECHOCARDIOGRAM (TEE);  Surgeon: Geralynn Rile, MD;  Location: [facility];  Service: Cardiovascular;  Laterality: N/A;  . TOTAL KNEE ARTHROPLASTY Bilateral     History reviewed. No pertinent family history.  Social History   Socioeconomic History  . Marital status: Married    Spouse name: Not on file  . Number of children: Not on file  . Years of education: Not on file  . Highest education level: Not on file  Occupational History  . Occupation: retired  Tobacco Use  . Smoking status: Former Smoker    Packs/day: 1.00    Years: 30.00    Pack years: 30.00    Quit date: 1975    Years since quitting: 47.1  . Smokeless tobacco: Never Used  Substance and Sexual Activity  . Alcohol use: Not Currently  . Drug use: Never  . Sexual activity: Not on file  Other Topics Concern  . Not on file  Social History Narrative  . Not on file   Social Determinants of Health   Financial Resource Strain: Not on file  Food Insecurity: No Food Insecurity  . Worried About Charity fundraiser in the Last Year: Never true  . Ran Out of Food in the Last Year: Never true  Transportation Needs: No Transportation Needs  . Lack of Transportation (Medical): No  . Lack of Transportation (Non-Medical): No  Physical Activity: Not on file  Stress: Not  on file  Social Connections: Not on file  Intimate Partner Violence: Not on file    Current Outpatient Medications  Medication Sig Dispense Refill  . acetaminophen (TYLENOL) 500 MG tablet Take 1,000 mg by mouth every 6 (six) hours as needed for headache (pain).    Marland Kitchen ALPRAZolam (XANAX) 0.25 MG tablet Take 0.125 mg by mouth at bedtime as needed for anxiety or sleep.    . Camphor-Menthol-Methyl Sal (SALONPAS) 3.08-11-08 % PTCH Place 1 patch onto the skin daily as needed (muscle aches/pain.).    Marland Kitchen diclofenac Sodium (VOLTAREN) 1 % GEL Apply 1 application topically 4 (four) times daily as needed (PAIN.).    Marland Kitchen docusate sodium (COLACE) 100 MG capsule Take 1 capsule (100 mg total) by mouth 2 (two) times daily. (Patient taking differently: Take 100 mg by mouth 2 (two) times daily as needed (constipation.).) 60 capsule 0  . dutasteride (AVODART) 0.5 MG capsule Take 0.5 mg by mouth daily.    Marland Kitchen HYDROcodone-acetaminophen (NORCO) 7.5-325 MG tablet Take 1 tablet by mouth 3 (three) times daily as needed (pain).    Marland Kitchen levalbuterol (XOPENEX HFA) 45 MCG/ACT inhaler Inhale 2 puffs into the lungs every 4 (four) hours as needed for wheezing or shortness of breath.    . levalbuterol (XOPENEX) 0.63 MG/3ML nebulizer solution Take 0.63 mg by nebulization 2 (two) times daily.    . metoprolol succinate (  TOPROL-XL) 50 MG 24 hr tablet Take 50 mg by mouth daily.    . montelukast (SINGULAIR) 10 MG tablet Take 10 mg by mouth daily.    . polyethylene glycol (MIRALAX / GLYCOLAX) 17 g packet Take 17 g by mouth daily as needed for mild constipation. (Patient taking differently: Take 17 g by mouth daily as needed (constipation.).) 14 each 0  . potassium chloride (KLOR-CON) 8 MEQ tablet Take 8 mEq by mouth daily.    Marland Kitchen Propylene Glycol (SYSTANE COMPLETE) 0.6 % SOLN Place 1 drop into both eyes 4 (four) times daily as needed (dry eyes).    . tamsulosin (FLOMAX) 0.4 MG CAPS capsule Take 0.8 mg by mouth at bedtime.    . tizanidine (ZANAFLEX)  2 MG capsule Take 2 mg by mouth at bedtime as needed for muscle spasms.    Marland Kitchen torsemide (DEMADEX) 20 MG tablet Take 1 tablet (20 mg total) by mouth daily. 30 tablet 2  . warfarin (COUMADIN) 6 MG tablet Take 6 mg by mouth at bedtime.     No current facility-administered medications for this visit.    No Known Allergies    Review of Systems:   General:  normal appetite, normal energy, no weight gain, no weight loss, no fever  Cardiac:  no chest pain with exertion, no chest pain at rest, +SOB with exertion, no resting SOB, no PND, no orthopnea, no palpitations, + arrhythmia, + atrial fibrillation, mild LE edema, no dizzy spells, no syncope  Respiratory:  + shortness of breath, + home oxygen, no productive cough, no dry cough, no bronchitis, no wheezing, no hemoptysis, no asthma, no pain with inspiration or cough, no sleep apnea, no CPAP at night  GI:   no difficulty swallowing, no reflux, no frequent heartburn, no hiatal hernia, no abdominal pain, no constipation, no diarrhea, no hematochezia, no hematemesis, no melena  GU:   no dysuria,  no frequency, no urinary tract infection, no hematuria, no enlarged prostate, no kidney stones, no kidney disease  Vascular:  no pain suggestive of claudication, no pain in feet, no leg cramps, no varicose veins, no DVT, no non-healing foot ulcer  Neuro:   no stroke, no TIA's, no seizures, no headaches, no temporary blindness one eye,  no slurred speech, no peripheral neuropathy, no chronic pain, no instability of gait, no memory/cognitive dysfunction  Musculoskeletal: + arthritis, no joint swelling, no myalgias, no difficulty walking, normal mobility   Skin:   no rash, no itching, no skin infections, no pressure sores or ulcerations  Psych:   no anxiety, no depression, no nervousness, no unusual recent stress  Eyes:   no blurry vision, no floaters, no recent vision changes, + wears glasses for reading  ENT:   no hearing loss, no loose or painful teeth although  one tooth was knocked out at time of TEE, partial upper dentures, last saw dentist within the past year  Hematologic:  + easy bruising, no abnormal bleeding, no clotting disorder, no frequent epistaxis  Endocrine:  no diabetes, does not check CBG's at home           Physical Exam:   BP (!) 147/82 (BP Location: Right Arm, Patient Position: Sitting, Cuff Size: Normal)   Pulse 71   Temp (!) 97.5 F (36.4 C) (Skin)   Resp 20   Ht _0  (1.676 m)   Wt 188 lb (85.3 kg)   SpO2 92% Comment: 2L [place]  BMI 30.34 kg/m   General:  Elderly,  well-appearing  HEENT:  Unremarkable   Neck:   no JVD, no bruits, no adenopathy   Chest:   clear to auscultation, symmetrical breath sounds, no wheezes, no rhonchi   CV:   RRR, grade III/VI crescendo/decrescendo murmur heard best at LUSB and loud grade IV/VI holosystolic murmur at the apex  Abdomen:  soft, non-tender, no masses   Extremities:  warm, well-perfused, pulses palpable, no LE edema  Rectal/GU  Deferred  Neuro:   Grossly non-focal and symmetrical throughout  Skin:   Clean and dry, no rashes, no breakdown   Diagnostic Tests:  EKG: Atrial fibrillation w/ RBBB (09/14/2019)      ECHOCARDIOGRAM REPORT       Patient Name:  GASPARE NETZEL Date of Exam: 07/16/2020  Medical Rec #: [id number] Height:    67.0 in  Accession #:  [id number] Weight:    179.2 lb  Date of Birth: 1926/09/09  BSA:     1.930 m  Patient Age:  55 years  BP:      94/56 mmHg  Patient Gender: M     HR:      71 bpm.  Exam Location: Inpatient   Procedure: 2D Echo, Cardiac Doppler and Color Doppler   Indications:  I50.33 Acute on chronic diastolic (congestive) heart  failure    History:    Patient has no prior history of Echocardiogram  examinations.         COPD; Arrythmias:Atrial Fibrillation.    Sonographer:  Jonelle Sidle Dance  Referring Phys: [place]    1. Left ventricular ejection  fraction, by estimation, is 55 to 60%. The  left ventricle has normal function. The left ventricle has no regional  wall motion abnormalities. There is moderate left ventricular hypertrophy.  Left ventricular diastolic  parameters are indeterminate.  2. Right ventricular systolic function is moderately reduced. The right  ventricular size is mildly enlarged. There is mildly elevated pulmonary  artery systolic pressure.  3. Left atrial size was moderately dilated.  4. Mild functional mitral stenosis due to severe MAC. The mitral valve is  degenerative. Mild mitral valve regurgitation. Mild mitral stenosis.  Severe mitral annular calcification.  5. Tricuspid valve regurgitation is moderate.  6. The aortic valve is tricuspid. There is severe calcifcation of the  aortic valve. There is severe thickening of the aortic valve. Aortic valve  regurgitation is not visualized. Severe aortic valve stenosis.  7. The inferior vena cava is normal in size with greater than 50%  respiratory variability, suggesting right atrial pressure of 3 mmHg.   FINDINGS  Left Ventricle: Left ventricular ejection fraction, by estimation, is 55  to 60%. The left ventricle has normal function. The left ventricle has no  regional wall motion abnormalities. The left ventricular internal cavity  size was normal in size. There is  moderate left ventricular hypertrophy. Left ventricular diastolic  parameters are indeterminate.   Right Ventricle: The right ventricular size is mildly enlarged. No  increase in right ventricular wall thickness. Right ventricular systolic  function is moderately reduced. There is mildly elevated pulmonary artery  systolic pressure. The tricuspid  regurgitant velocity is 3.08 m/s, and with an assumed right atrial  pressure of 3 mmHg, the estimated right ventricular systolic pressure is  20.9 mmHg.   Left Atrium: Left atrial size was moderately dilated.   Right Atrium: Right atrial  size was normal in size.   Pericardium: There is no evidence of pericardial effusion.   Mitral Valve: Mild functional mitral stenosis  due to severe MAC. The  mitral valve is degenerative in appearance. There is moderate thickening  of the mitral valve leaflet(s). There is moderate calcification of the  mitral valve leaflet(s). Severe mitral  annular calcification. Mild mitral valve regurgitation. Mild mitral valve  stenosis. MV peak gradient, 14.2 mmHg. The mean mitral valve gradient is  4.5 mmHg.   Tricuspid Valve: The tricuspid valve is normal in structure. Tricuspid  valve regurgitation is moderate . No evidence of tricuspid stenosis.   Aortic Valve: The aortic valve is tricuspid. There is severe calcifcation  of the aortic valve. There is severe thickening of the aortic valve. There  is severe aortic valve annular calcification. Aortic valve regurgitation  is not visualized. Severe aortic  stenosis is present. Aortic valve mean gradient measures 37.0 mmHg.  Aortic valve peak gradient measures 63.4 mmHg. Aortic valve area, by VTI  measures 0.83 cm.   Pulmonic Valve: The pulmonic valve was normal in structure. Pulmonic valve  regurgitation is mild. No evidence of pulmonic stenosis.   Aorta: The aortic root is normal in size and structure.   Venous: The inferior vena cava is normal in size with greater than 50%  respiratory variability, suggesting right atrial pressure of 3 mmHg.   IAS/Shunts: No atrial level shunt detected by color flow Doppler.     LEFT VENTRICLE  PLAX 2D  LVIDd:     5.10 cm  LVIDs:     3.10 cm  LV PW:     1.40 cm  LV IVS:    1.40 cm  LVOT diam:   2.40 cm  LV SV:     82  LV SV Index:  42  LVOT Area:   4.52 cm     RIGHT VENTRICLE      IVC  RV Basal diam: 3.50 cm  IVC diam: 1.60 cm  RV Mid diam:  3.00 cm  RV S prime:   8.59 cm/s  TAPSE (M-mode): 1.3 cm   LEFT ATRIUM       Index    RIGHT ATRIUM       Index  LA diam:    5.60 cm 2.90 cm/m  RA Area:   35.60 cm  LA Vol (A2C):  252.0 ml 130.56 ml/m RA Volume:  135.00 ml 69.94 ml/m  LA Vol (A4C):  185.0 ml 95.85 ml/m  LA Biplane Vol: 223.0 ml 115.53 ml/m  AORTIC VALVE  AV Area (Vmax):  0.87 cm  AV Area (Vmean):  0.84 cm  AV Area (VTI):   0.83 cm  AV Vmax:      398.25 cm/s  AV Vmean:     284.750 cm/s  AV VTI:      0.980 m  AV Peak Grad:   63.4 mmHg  AV Mean Grad:   37.0 mmHg  LVOT Vmax:     76.75 cm/s  LVOT Vmean:    52.700 cm/s  LVOT VTI:     0.180 m  LVOT/AV VTI ratio: 0.18    AORTA  Ao Root diam: 3.60 cm  Ao Asc diam: 3.50 cm   MITRAL VALVE        TRICUSPID VALVE  MV Area (PHT): 1.67 cm   TR Peak grad:  37.9 mmHg  MV Peak grad: 14.2 mmHg  TR Vmax:    308.00 cm/s  MV Mean grad: 4.5 mmHg  MV Vmax:    1.88 m/s   SHUNTS  MV Vmean:   89.2 cm/s  Systemic VTI: 0.18 m  MV  Decel Time: 454 msec   Systemic Diam: 2.40 cm  MV E velocity: 181.00 cm/s  MV A velocity: 58.20 cm/s  MV E/A ratio: 3.11   Jenkins Rouge MD  Electronically signed by Jenkins Rouge MD  Signature Date/Time: 07/16/2020/3:21:03 PM        RIGHT/LEFT HEART CATH AND CORONARY ANGIOGRAPHY    Conclusion    Prox RCA lesion is 40% stenosed.  RPDA lesion is 60% stenosed.  1st RPL lesion is 50% stenosed.  Mid LM to Dist LM lesion is 30% stenosed.  Prox LAD to Mid LAD lesion is 50% stenosed.  1st Diag lesion is 50% stenosed.   1.  Diffuse calcific nonobstructive CAD 2.  Severe calcific aortic stenosis with mean gradient 37 mmHg, aortic valve area 1.05 cm 3.  Mild pulmonary hypertension  Recommendation: Continue multidisciplinary team evaluation for treatment of severe symptomatic aortic stenosis   Surgeon Notes    09/07/2020 12:10 PM CV Procedure signed by Geralynn Rile, MD    Indications  Severe aortic stenosis [I35.0 (ICD-10-CM)]    Procedural Details  Technical Details INDICATION: Severe, symptomatic aortic stenosis  PROCEDURAL DETAILS: There was an indwelling IV in a right antecubital vein. Using normal sterile technique, the IV was changed out for a 5 Fr brachial sheath over a 0.018 inch wire. The right wrist was then prepped, draped, and anesthetized with 1% lidocaine.  Ultrasound guidance is used for right radial artery access.  Using the modified Seldinger technique a 5/6 French Slender sheath was placed in the right radial artery.  Ultrasound images are digitally captured and stored in the patient's chart.  Intra-arterial verapamil was administered through the radial artery sheath. IV heparin was administered after a JR4 catheter was advanced into the central aorta. A Swan-Ganz catheter was used for the right heart catheterization. Standard protocol was followed for recording of right heart pressures and sampling of oxygen saturations. Fick cardiac output was calculated. Standard Judkins catheters were used for selective coronary angiography. LV pressure is recorded and an aortic valve pullback is performed.  An AL two catheter was used to direct a straight tip wire across the aortic valve and pullback pressures were recorded.  There were no immediate procedural complications. The patient was transferred to the post catheterization recovery area for further monitoring.    Estimated blood loss <50 mL.   During this procedure medications were administered to achieve and maintain moderate conscious sedation while the patient's heart rate, blood pressure, and oxygen saturation were continuously monitored and I was present face-to-face 100% of this time.   Medications (Filter: Administrations occurring from 1048 to 1154 on 08/24/20) (important) Continuous medications are totaled by the amount administered until 08/24/20 1154.    midazolam (VERSED) injection (mg) Total dose:  1 mg  Date/Time Rate/Dose/Volume Action    08/24/20 1107 1 mg Given    lidocaine (PF) (XYLOCAINE) 1 % injection (mL) Total volume:  4 mL  Date/Time Rate/Dose/Volume Action   08/24/20 1111 2 mL Given   1113 2 mL Given    Radial Cocktail/Verapamil only (mL) Total volume:  10 mL  Date/Time Rate/Dose/Volume Action   08/24/20 1117 10 mL Given    heparin sodium (porcine) injection (Units) Total dose:  5,000 Units  Date/Time Rate/Dose/Volume Action   08/24/20 1128 5,000 Units Given    Heparin (Porcine) in NaCl 1000-0.9 UT/500ML-% SOLN (mL) Total volume:  1,000 mL  Date/Time Rate/Dose/Volume Action   08/24/20 1146 500 mL Given   1146 500 mL Given  iohexol (OMNIPAQUE) 350 MG/ML injection (mL) Total volume:  55 mL  Date/Time Rate/Dose/Volume Action   08/24/20 1153 55 mL Given    Sedation Time  Sedation Time Physician-1: 40 minutes 44 seconds   Contrast  Medication Name Total Dose  iohexol (OMNIPAQUE) 350 MG/ML injection 55 mL    Radiation/Fluoro  Fluoro time: 7.6 (min) DAP: 21229 (mGycm2) Cumulative Air Kerma: 336 (mGy)   Coronary Findings   Diagnostic Dominance: Right  Left Main  Mid LM to Dist LM lesion is 30% stenosed. The lesion is calcified.  Left Anterior Descending  Prox LAD to Mid LAD lesion is 50% stenosed. The lesion is moderately calcified.  First Diagonal Branch  1st Diag lesion is 50% stenosed.  Left Circumflex  There is mild diffuse disease throughout the vessel.  Right Coronary Artery  Vessel is large.  Prox RCA lesion is 40% stenosed. The lesion is moderately calcified.  Right Posterior Descending Artery  RPDA lesion is 60% stenosed. The lesion is calcified.  First Right Posterolateral Branch  1st RPL lesion is 50% stenosed.   Intervention   No interventions have been documented.  Left Heart  Mitral Valve The annulus is calcified.  Aortic Valve There is severe aortic valve stenosis. The aortic valve is calcified. There is restricted aortic valve motion. Severe aortic  stenosis is present with peak to peak gradient 47 mmHg, mean gradient 37 mmHg, Peak instantaneous gradient 53 mmHg, calculated aortic valve area 1.05 cm   Coronary Diagrams   Diagnostic Dominance: Right    Intervention    Implants    No implant documentation for this case.    Syngo Images  Show images for CARDIAC CATHETERIZATION  Images on Long Term Storage  Show images for [place], [place] to Procedure Log  Procedure Log     Hemo Data  Flowsheet Row Most Recent Value  Fick Cardiac Output 5.19 L/min  Fick Cardiac Output Index 2.67 (L/min)/BSA  Aortic Mean Gradient 36.89 mmHg  Aortic Peak Gradient 47 mmHg  Aortic Valve Area 1.05  Aortic Value Area Index 0.54 cm2/BSA  RA A Wave 9 mmHg  RA V Wave 10 mmHg  RA Mean 8 mmHg  RV Systolic Pressure 48 mmHg  RV Diastolic Pressure 4 mmHg  RV EDP 8 mmHg  PA Systolic Pressure 54 mmHg  PA Diastolic Pressure 16 mmHg  PA Mean 32 mmHg  PW A Wave 23 mmHg  PW V Wave 29 mmHg  PW Mean 22 mmHg  AO Systolic Pressure 97 mmHg  AO Diastolic Pressure 64 mmHg  AO Mean 78 mmHg  LV Systolic Pressure 976 mmHg  LV Diastolic Pressure 7 mmHg  LV EDP 16 mmHg  AOp Systolic Pressure 734 mmHg  AOp Diastolic Pressure 59 mmHg  AOp Mean Pressure 77 mmHg  LVp Systolic Pressure 193 mmHg  LVp Diastolic Pressure 4 mmHg  LVp EDP Pressure 13 mmHg  QP/QS 1  TPVR Index 12 HRUI  TSVR Index 29.24 HRUI  PVR SVR Ratio 0.14  TPVR/TSVR Ratio 0.41        TRANSESOPHOGEAL ECHO REPORT       Patient Name:  TOVIA KISNER Date of Exam: 09/07/2020  Medical Rec #: [id number] Height:    66.0 in  Accession #:  [id number] Weight:    186.5 lb  Date of Birth: [birth year]  BSA:     1.941 m  Patient Age:  30 years  BP:      132/79 mmHg  Patient Gender: M  HR:      82 bpm.  Exam Location: Outpatient   Procedure: 3D Echo, Transesophageal Echo, Cardiac Doppler, Color Doppler  and       Saline Contrast Bubble  Study   Indications:   I35.0 Nonrheumatic aortic (valve) stenosis    History:     Patient has prior history of Echocardiogram examinations,  most          recent 07/16/2020. CHF, COPD, Aortic Valve Disease;          Arrythmias:Atrial Fibrillation and RBBB.    Sonographer:   Tiffany Dance  Referring Phys: [id number] Eileen Stanford  Diagnosing Phys: Eleonore Chiquito MD   PROCEDURE: After discussion of the risks and benefits of a TEE, an  informed consent was obtained from the patient. TEE procedure time was 40  minutes. The transesophogeal probe was passed without difficulty through  the esophogus of the patient. Local  oropharyngeal anesthetic was provided with Cetacaine. Sedation performed  by different physician. The patient was monitored while under deep  sedation. Anesthestetic sedation was provided intravenously by  Anesthesiology: 332m of Propofol, 369mof  Lidocaine. Image quality was excellent. The patient's vital signs;  including heart rate, blood pressure, and oxygen saturation; remained  stable throughout the procedure. The patient developed no complications  during the procedure.   IMPRESSIONS    1. The MV is degenerative with a small flail P2 segment. 2D VC 0.34 cm,  2D ERO 0.27 cm2 (79 degrees), R vol 28 cc. 3D VCA 0.13 cm2. The flail gap  is 0.21 cm and flail width 0.52 cm. The jet is eccentric and there is  systolic pulmonary flow reversal in  the left upper pulmonary vein, but due to the small flail segment, this is  not likely hemodynamically significant. There is mild mitral stenosis with  3D MVA 2.3 cm2. MG 3.8 mmHG @ 78 bpm. The mitral valve is degenerative.  Mild to moderate mitral valve  regurgitation. Mild mitral stenosis. The mean mitral valve gradient is 3.8  mmHg with average heart rate of 75 bpm. Severe mitral annular  calcification.  2. The bubble study was positive after 6 cardiac cycles, indicating there  is an  intrapulmonary shunt. There is no interatrial shunt. Agitated saline  contrast bubble study was positive with shunting observed after >6 cardiac  cycles suggestive of  intrapulmonary shunting.  3. The aortic valve is tricuspid. There is severe calcifcation of the  aortic valve. There is severe thickening of the aortic valve. Aortic valve  regurgitation is mild. Severe aortic valve stenosis. Aortic valve area, by  VTI measures 0.52 cm. Aortic  valve Vmax measures 3.94 m/s.  4. Left ventricular ejection fraction, by estimation, is 50 to 55%. The  left ventricle has low normal function. The left ventricle has no regional  wall motion abnormalities.  5. Right ventricular systolic function is mildly reduced. The right  ventricular size is moderately enlarged.  6. Left atrial size was severely dilated. No left atrial/left atrial  appendage thrombus was detected. The LAA emptying velocity was 32 cm/s.  7. Right atrial size was severely dilated.  8. The tricuspid valve is abnormal. Tricuspid valve regurgitation is  moderate to severe.  9. Pulmonic valve regurgitation is moderate.  10. Severely dilated pulmonary artery.   FINDINGS  Left Ventricle: Left ventricular ejection fraction, by estimation, is 50  to 55%. The left ventricle has low normal function. The left ventricle has  no regional wall motion abnormalities. The left ventricular  internal  cavity size was normal in size.   Right Ventricle: The right ventricular size is moderately enlarged. No  increase in right ventricular wall thickness. Right ventricular systolic  function is mildly reduced.   Left Atrium: Left atrial size was severely dilated. No left atrial/left  atrial appendage thrombus was detected. The LAA emptying velocity was 32  cm/s.   Right Atrium: Right atrial size was severely dilated.   Pericardium: There is no evidence of pericardial effusion.   Mitral Valve: The MV is degenerative with a small flail  P2 segment. 2D VC  0.34 cm, 2D ERO 0.27 cm2 (79 degrees), R vol 28 cc. 3D VCA 0.13 cm2. The  flail gap is 0.21 cm and flail width 0.52 cm. The jet is eccentric and  there is systolic pulmonary flow  reversal in the left upper pulmonary vein, but due to the small flail  segment, this is not likely hemodynamically significant. There is mild  mitral stenosis with 3D MVA 2.3 cm2. MG 3.8 mmHG @ 78 bpm. The mitral  valve is degenerative in appearance. Severe  mitral annular calcification. Mild to moderate mitral valve  regurgitation. Mild mitral valve stenosis. The mean mitral valve gradient  is 3.8 mmHg with average heart rate of 75 bpm.   Tricuspid Valve: The tricuspid valve is abnormal. Tricuspid valve  regurgitation is moderate to severe. No evidence of tricuspid stenosis.   Aortic Valve: The aortic valve is tricuspid. There is severe calcifcation  of the aortic valve. There is severe thickening of the aortic valve.  Aortic valve regurgitation is mild. Severe aortic stenosis is present.  Aortic valve mean gradient measures 38.1  mmHg. Aortic valve peak gradient measures 62.1 mmHg. Aortic valve area,  by VTI measures 0.52 cm.   Pulmonic Valve: The pulmonic valve was grossly normal. Pulmonic valve  regurgitation is moderate. No evidence of pulmonic stenosis.   Aorta: The aortic root and ascending aorta are structurally normal, with  no evidence of dilitation.   Pulmonary Artery: The pulmonary artery is severely dilated.   Venous: A pattern of systolic flow reversal, suggestive of severe mitral  regurgitation is recorded from the left upper pulmonary vein.   IAS/Shunts: The atrial septum is grossly normal. Agitated saline contrast  was given intravenously to evaluate for intracardiac shunting. Agitated  saline contrast bubble study was positive with shunting observed after >6  cardiac cycles suggestive of  intrapulmonary shunting. The bubble study was positive after 6 cardiac   cycles, indicating there is an intrapulmonary shunt. There is no  interatrial shunt.     LEFT VENTRICLE  PLAX 2D  LVOT diam:   2.20 cm  LV SV:     53  LV SV Index:  27  LVOT Area:   3.80 cm     AORTIC VALVE  AV Area (Vmax):  0.61 cm  AV Area (Vmean):  0.59 cm  AV Area (VTI):   0.52 cm  AV Vmax:      394.08 cm/s  AV Vmean:     294.660 cm/s  AV VTI:      1.032 m  AV Peak Grad:   62.1 mmHg  AV Mean Grad:   38.1 mmHg  LVOT Vmax:     62.82 cm/s  LVOT Vmean:    46.070 cm/s  LVOT VTI:     0.140 m  LVOT/AV VTI ratio: 0.14    AORTA  Ao Root diam: 2.60 cm  Ao Asc diam: 3.06 cm   MITRAL VALVE  TRICUSPID VALVE  MV Area VTI: 1.45 cm    TR Peak grad:  44.9 mmHg  MV Mean grad: 3.8 mmHg    TR Vmax:    335.00 cm/s  MV VTI:    0.37 m  MR Peak grad:  46.2 mmHg  SHUNTS  MR Vmax:     339.91 cm/s Systemic VTI: 0.14 m  MR PISA:     9.05 cm  Systemic Diam: 2.20 cm  MR PISA Eff ROA: 103 mm  MR PISA Radius: 1.20 cm   Eleonore Chiquito MD  Electronically signed by Eleonore Chiquito MD  Signature Date/Time: 09/07/2020/9:20:02 PM     Cardiac TAVR CT  TECHNIQUE: The patient was scanned on a [employer]. A 100 kV retrospective scan was triggered in the descending thoracic aorta at 111 HU's. Gantry rotation speed was 250 msecs and collimation was .6 mm. No beta blockade or nitro were given. The 3D data set was reconstructed in 5% intervals of the R-R cycle. Systolic and diastolic phases were analyzed on a dedicated work station using MPR, MIP and VRT modes. The patient received 80 cc of contrast.  FINDINGS: Image quality: Average.  The entire left ventricle was not imaged.  Noise artifact is: Moderate cardiac motion artifact. Millisecond reconstruction performed.  Valve Morphology: The aortic valve is tricuspid with diffuse severe calcifications. The leaflets are severe  restricted in systole consistent with severe aortic stenosis.  Aortic Valve Calcium score: 4577  Aortic annular dimension:  Phase assessed: 200 ms  Annular area: 448 mm2  Annular perimeter: 75.9 mm  Max diameter: 26.8 mm  Min diameter: 21.9 mm  Annular and subannular calcification: No annular calcification. There is minimal subannular calcification under the [place].  Optimal coplanar projection: LAO 27 CAU 4  Coronary Artery Height above Annulus:  Left Main: 12.6 mm  Right Coronary: 17.0 mm  Sinus of Valsalva Measurements:  Non-coronary: 30 mm  Right-coronary: 31 mm  Left-coronary: 31 mm  Sinus of Valsalva Height:  Non-coronary: 23.0 mm  Right-coronary: 21.3 mm  Left-coronary: 19.8 mm  Sinotubular Junction: 28 mm  Ascending Thoracic Aorta: 34 mm  Coronary Arteries: Normal coronary origin. Right dominance. The study was performed without use of NTG and is insufficient for plaque evaluation. Please refer to recent cardiac catheterization for coronary assessment. 3-vessel calcifications noted.  Cardiac Morphology:  Right Atrium: Right atrial size is dilated.  Right Ventricle: The right ventricular cavity is dilated.  Left Atrium: Left atrial size is dilated in size. There is contrast mixing artifact in the LAA.  Left Ventricle: The ventricular cavity size is within normal limits and there are no stigmata of prior infarction in the visualized portion.  Pulmonary arteries: Dilated in size without proximal filling defect. Findings suggestive of pulmonary hypertension.  Pulmonary veins: Normal pulmonary venous drainage.  Pericardium: Normal thickness with no significant effusion or calcium present.  Mitral Valve: The mitral valve is degenerative with severe mitral annular calcification.  Extra-cardiac findings: See attached radiology report for non-cardiac structures.  IMPRESSION: 1. Trileaflet aortic valve with  severe aortic stenosis (calcium score 4577).  2. Cardiac motion artifact present and annular measurements performed using 200 ms reconstruction.  3. Annular measurements favorable for 26 mm Edwards Sapien 3 TAVR (448 mm2).  4. No significant annular calcification. Minimal subannular calcification under the [place].  5. Sufficient coronary to annulus distance.  6. Optimal Fluoroscopic Angle for Delivery: LAO 27 CAU 4  7. Dilated pulmonary artery suggestive of pulmonary hypertension.  8. Contrast mixing  artifact is present in the LAA.  9. Severe mitral annular calcification.  Lake Bells T. Audie Box, MD   Electronically Signed   By: Eleonore Chiquito   On: 08/30/2020 18:12    CT ANGIOGRAPHY CHEST, ABDOMEN AND PELVIS  TECHNIQUE: Non-contrast CT of the chest was initially obtained.  Multidetector CT imaging through the chest, abdomen and pelvis was performed using the standard protocol during bolus administration of intravenous contrast. Multiplanar reconstructed images and MIPs were obtained and reviewed to evaluate the vascular anatomy.  CONTRAST:  55m OMNIPAQUE IOHEXOL 350 MG/ML SOLN  COMPARISON:  No priors.  FINDINGS: CTA CHEST FINDINGS  Cardiovascular: Heart size is enlarged with biatrial dilatation. There is no significant pericardial fluid, thickening or pericardial calcification. There is aortic atherosclerosis, as well as atherosclerosis of the great vessels of the mediastinum and the coronary arteries, including calcified atherosclerotic plaque in the left main, left anterior descending, left circumflex and right coronary arteries. Severe thickening calcification of the aortic valve. Severe calcifications of the mitral annulus. Dilatation of the pulmonic trunk (4.1 cm in diameter).  Mediastinum/Lymph Nodes: No pathologically enlarged mediastinal or hilar lymph nodes. Multiple densely calcified right hilar and mediastinal lymph nodes are  incidentally noted. Esophagus is unremarkable in appearance. No axillary lymphadenopathy.  Lungs/Pleura: Moderate bilateral pleural effusions lying dependently with areas of passive subsegmental atelectasis in the lower lobes of the lungs bilaterally. In the right lower lobe (axial image 90 of series 6 and coronal image 101 of series 8) there is an elongated nodular density measuring 1.4 x 2.1 x 2.8 cm which appears associated with a pulmonary artery branch to the right lower lobe, and demonstrates enhancement characteristics similar to adjacent vasculature, suspicious for a pulmonary arteriovenous malformation. No other definite suspicious appearing pulmonary nodules or masses are noted. No acute consolidative airspace disease. Mild diffuse bronchial wall thickening with mild centrilobular and paraseptal emphysema.  Musculoskeletal/Soft Tissues: There are no aggressive appearing lytic or blastic lesions noted in the visualized portions of the skeleton.  CTA ABDOMEN AND PELVIS FINDINGS  Hepatobiliary: No suspicious cystic or solid hepatic lesions. No intra or extrahepatic biliary ductal dilatation. Calcified gallstone measuring 1.1 cm in diameter in the dependent portion of the gallbladder. No findings to suggest an acute cholecystitis at this time.  Pancreas: No pancreatic mass. No pancreatic ductal dilatation. No pancreatic or peripancreatic fluid collections or inflammatory changes.  Spleen: Unremarkable.  Adrenals/Urinary Tract: Low-attenuation lesions in the left kidney, largest of which is in the interpolar region where there is an exophytic 5.2 cm lesion, compatible with simple cysts. Other subcentimeter low-attenuation lesions in the right kidney are too small to definitively characterize, but statistically likely to represent tiny cysts. No aggressive appearing renal lesions. No hydroureteronephrosis. Urinary bladder is nearly decompressed, but otherwise  unremarkable in appearance.  Stomach/Bowel: The appearance of the stomach is normal. No pathologic dilatation of small bowel or colon. Short segment of distal small bowel extends into a small right inguinal hernia. The appendix is not confidently identified and may be surgically absent. Regardless, there are no inflammatory changes noted adjacent to the cecum to suggest the presence of an acute appendicitis at this time.  Vascular/Lymphatic: Aortic atherosclerosis, with vascular findings and measurements pertinent to potential TAVR procedure, as detailed below. No aneurysm or dissection noted in the abdominal or pelvic vasculature. No lymphadenopathy noted in the abdomen or pelvis.  Reproductive: Prostate gland is enlarged measuring up to 5.2 x 4.1 x 5.9 cm. Seminal vesicles are unremarkable in appearance.  Other: Small  right inguinal hernia containing a short loop of distal small bowel (likely ileum). No significant volume of ascites. No pneumoperitoneum.  Musculoskeletal: There are no aggressive appearing lytic or blastic lesions noted in the visualized portions of the skeleton.  VASCULAR MEASUREMENTS PERTINENT TO TAVR:  AORTA:  Minimal Aortic Diameter-13 x 12 mm  Severity of Aortic Calcification-severe  RIGHT PELVIS:  Right Common Iliac Artery -  Minimal Diameter-8.6 x 9.7 mm  Tortuosity-mild  Calcification-moderate  Right External Iliac Artery -  Minimal Diameter-7.0 x 7.1 mm  Tortuosity-severe  Calcification-mild  Right Common Femoral Artery -  Minimal Diameter-8.4 x 4.3 mm  Tortuosity-mild  Calcification-moderate to severe  LEFT PELVIS:  Left Common Iliac Artery -  Minimal Diameter-9.5 x 9.2 mm  Tortuosity-mild  Calcification-moderate  Left External Iliac Artery -  Minimal Diameter-7.4 x 6.7 mm  Tortuosity-severe  Calcification-none  Left Common Femoral Artery -  Minimal Diameter-8.6 x 7.1  mm  Tortuosity-mild  Calcification-severe  Review of the MIP images confirms the above findings.  IMPRESSION: 1. Vascular findings and measurements pertinent to potential TAVR procedure, as detailed above. 2. Severe thickening calcification of the aortic valve, compatible with reported clinical history of severe aortic stenosis. 3. Severe dilatation of the pulmonic trunk (4.1 cm in diameter), concerning for pulmonary arterial hypertension. Notably, there is also an elongated nodular density in the right lower lobe which has enhancement characteristics suggestive of a vascular lesions such as a pulmonary arteriovenous malformation. This could be confirmed with PE protocol CT scan if of clinical concern. 4. Severe calcifications of the mitral annulus. 5. Cardiomegaly with biatrial dilatation. 6. Moderate bilateral pleural effusions with areas of passive subsegmental atelectasis in the dependent portions of the lower lobes of the lungs bilaterally. 7. Small right inguinal hernia containing a short segment of distal small bowel, without evidence of bowel incarceration or obstruction at this time. 8. Cholelithiasis without evidence of acute cholecystitis. 9. Prostatomegaly. 10. Additional incidental findings, as above.   Electronically Signed   By: Vinnie Langton M.D.   On: 08/31/2020 08:30     Impression:  Patient has multi valvular heart disease with stage D1 severe symptomatic aortic stenosis and severe primary mitral regurgitation.  He currently describes stable symptoms of exertional shortness of breath consistent with chronic diastolic congestive heart failure New York Heart Association functional class II.  He was hospitalized recently with hypoxemic respiratory failure felt to be a combination of acute exacerbation of COPD, acute rhinovirus infection, and acute on chronic diastolic congestive heart failure.  I have personally reviewed the patient's recent  transthoracic and transesophageal echocardiograms, diagnostic cardiac catheterization, and CT angiograms.  Echocardiograms demonstrate the presence of severe calcific aortic stenosis.  The aortic valve is trileaflet with severe thickening and calcification involving all 3 leaflets of the aortic valve.  There is severely restricted leaflet mobility.  Peak velocity across aortic valve measured 4 m/s corresponding to mean transvalvular gradient estimated 37 mmHg and aortic valve area calculated 0.83 cm by VTI.  The DVI was notably quite low at 0.18.  Left ventricular systolic function remain normal.  On transthoracic echocardiogram patient was noted to have severe mitral annular calcification with what was initially felt to be mild mitral regurgitation, although careful review of this exam suggested the possibility that the mitral regurgitation might be significantly worse.  Transesophageal echocardiogram confirms the presence of a small flail segment of the middle scallop of the posterior leaflet with ruptured primary chordae tendinae in the setting of extremely severe mitral annular  calcification involving the entire posterior mitral leaflet.  The jet of regurgitation was very eccentric and associated with flow reversal in the pulmonary veins.  Diagnostic cardiac catheterization is notable for the absence of significant flow-limiting coronary artery disease but also confirmed the presence of extremely heavy mitral annular calcification.  Cardiac-gated CTA of the heart reveals anatomical characteristics consistent with aortic stenosis suitable for treatment by transcatheter aortic valve replacement without any significant complicating features and CTA of the aorta and iliac vessels demonstrate what appears to be adequate pelvic vascular access to facilitate a transfemoral approach.  Baseline EKG reveals atrial fibrillation with right bundle branch block.  Despite the patient's relatively good functional status, I  would not consider this patient to be candidate for conventional surgical intervention under any circumstances due to his extremely advanced age, significant chronic lung disease with home oxygen dependency, and complex surgical problem including both severe aortic stenosis and severe mitral regurgitation in the setting of advanced calcification of the mitral valve and entire posterior mitral annulus.  Options include long-term palliative medical care versus transcatheter aortic valve replacement followed by continued medical care for management of his mitral regurgitation.  The patient's mitral valve does not appear to have anatomical characteristics suitable for transcatheter edge-to-edge repair, and at present there are no commercially available transcatheter mitral valve prosthetic devices suitable for management of this patient's problem.    Plan:  The patient and his son were counseled at length regarding treatment alternatives for management of severe symptomatic aortic stenosis and mitral regurgitation. Alternative approaches such as conventional aortic and mitral valve replacement, transcatheter aortic valve replacement, and continued medical therapy without intervention were compared and contrasted at length.  The risks associated with conventional surgery were discussed in detail as were the reasons why I would not consider this patient a candidate for conventional surgery.  Issues specific to transcatheter aortic valve replacement were discussed including questions about long term valve durability, the potential for paravalvular leak, possible increased risk of need for permanent pacemaker placement, the inability to effectively deal with the patient's mitral regurgitation, and other technical complications related to the procedure itself.  Long-term prognosis with medical therapy was discussed. This discussion was placed in the context of the patient's own specific clinical presentation and past  medical history.  All of their questions have been addressed.  The patient desires to proceed with transcatheter aortic valve replacement in the near future.  We tentatively plan to proceed on September 20, 2020.  Following the decision to proceed with transcatheter aortic valve replacement, a discussion has been held regarding what types of management strategies would be attempted intraoperatively in the event of life-threatening complications, including whether or not the patient would be considered a candidate for the use of cardiopulmonary bypass and/or conversion to open sternotomy for attempted surgical intervention.  The patient specifically requests that should a potentially life-threatening complication develop we would not attempt emergency median sternotomy and/or other aggressive surgical procedures.  The patient has been advised of a variety of complications that might develop including but not limited to risks of death, stroke, paravalvular leak, aortic dissection or other major vascular complications, aortic annulus rupture, device embolization, cardiac rupture or perforation, mitral regurgitation, acute myocardial infarction, arrhythmia, heart block or bradycardia requiring permanent pacemaker placement, congestive heart failure, respiratory failure, renal failure, pneumonia, infection, other late complications related to structural valve deterioration or migration, or other complications that might ultimately cause a temporary or permanent loss of functional independence or other long  term morbidity.  The patient provides full informed consent for the procedure as described and all questions were answered.    I spent in excess of 90 minutes during the conduct of this office consultation and >50% of this time involved direct face-to-face encounter with the patient for counseling and/or coordination of their care.     Valentina Gu. Roxy Manns, MD 09/13/2020 3:29 PM

## 2020-09-14 ENCOUNTER — Other Ambulatory Visit: Payer: Self-pay

## 2020-09-14 ENCOUNTER — Ambulatory Visit: Payer: Medicare Other | Attending: Cardiovascular Disease | Admitting: Physical Therapy

## 2020-09-14 ENCOUNTER — Encounter: Payer: Medicare Other | Admitting: Surgery

## 2020-09-14 ENCOUNTER — Encounter: Payer: Self-pay | Admitting: Physical Therapy

## 2020-09-14 DIAGNOSIS — R2689 Other abnormalities of gait and mobility: Secondary | ICD-10-CM | POA: Diagnosis present

## 2020-09-14 DIAGNOSIS — M25611 Stiffness of right shoulder, not elsewhere classified: Secondary | ICD-10-CM | POA: Insufficient documentation

## 2020-09-14 DIAGNOSIS — I350 Nonrheumatic aortic (valve) stenosis: Secondary | ICD-10-CM

## 2020-09-14 NOTE — Progress Notes (Signed)
I discussed Coumadin instructions with Dr Cornelius Moras and we will plan to have the pt Stop Coumadin on 2/10, he will take last dosage on 2/9. The pt's son was contacted with these updated instructions.

## 2020-09-14 NOTE — Therapy (Signed)
[facility] Outpatient Rehabilitation [facility] [address] [place], [place], [address] Phone: [phone]   Fax:  [phone]  Physical Therapy Evaluation  Patient Details  Name: Ruben [name] MRN: [id number] Date of Birth: 02-07-1927 Referring Provider (PT): Tonny Bollman, MD   Encounter Date: 09/14/2020   PT End of Session - 09/14/20 1054    Visit Number 1    Number of Visits 1    Date for PT Re-Evaluation 09/15/20    PT Start Time 1055    PT Stop Time 1125    PT Time Calculation (min) 30 min    Equipment Utilized During Treatment Gait belt    Activity Tolerance Patient tolerated treatment well    Behavior During Therapy [facility] for tasks assessed/performed           Past Medical History:  Diagnosis Date  . Arthritis   . CHF (congestive heart failure) (HCC)   . COPD (chronic obstructive pulmonary disease) (HCC)   . Mitral regurgitation   . Persistent atrial fibrillation (HCC)    on coumadin   . RBBB   . Severe aortic stenosis     Past Surgical History:  Procedure Laterality Date  . BUBBLE STUDY  09/07/2020   Procedure: BUBBLE STUDY;  Surgeon: Sande Rives, MD;  Location: [facility] ENDOSCOPY;  Service: Cardiovascular;;  . RIGHT/LEFT HEART CATH AND CORONARY ANGIOGRAPHY N/A 08/24/2020   Procedure: RIGHT/LEFT HEART CATH AND CORONARY ANGIOGRAPHY;  Surgeon: Tonny Bollman, MD;  Location: [facility] INVASIVE CV LAB;  Service: Cardiovascular;  Laterality: N/A;  . TEE WITHOUT CARDIOVERSION N/A 09/07/2020   Procedure: TRANSESOPHAGEAL ECHOCARDIOGRAM (TEE);  Surgeon: Sande Rives, MD;  Location: [facility] ENDOSCOPY;  Service: Cardiovascular;  Laterality: N/A;  . TOTAL KNEE ARTHROPLASTY Bilateral     There were no vitals filed for this visit.    Subjective Assessment - 09/14/20 1055    Subjective pt is a 87 y.o M CC SOB which he started about 4 months, since onset he reports it has stayed the same. he arrived with supplemental oxygen at 2LPM hx of shoulder and back  pain, hx of bil TKA. recently hospitalized with Rhinovirus infection and COPD exacerbation from 12/10 - 07/19/2020.    Patient Stated Goals to fix heart    Currently in Pain? No/denies              [facility] PT Assessment - 09/14/20 0001      Assessment   Medical Diagnosis Severe Aortic Stenosis    Referring Provider (PT) Tonny Bollman, MD    Onset Date/Surgical Date --   4 months ago   Hand Dominance Right      Precautions   Precautions None      Restrictions   Weight Bearing Restrictions No      Balance Screen   Has the patient fallen in the past 6 months No      Home Environment   Living Environment Private residence    Living Arrangements Spouse/significant other;Children    Available Help at Discharge Family    Type of Home House    Home Access Stairs to enter    Entrance Stairs-Number of Steps 2    Entrance Stairs-Rails None    Home Layout Two level;Able to live on main level with bedroom/bathroom   pt reports he doesn't use     ROM / Strength   AROM / PROM / Strength AROM;Strength      AROM   Overall AROM  Within functional limits for tasks performed;Deficits  Overall AROM Comments Limited R shoulder AROM in all planes      Strength   Overall Strength Within functional limits for tasks performed    Strength Assessment Site Hand    Right Hand Grip (lbs) 64    Left Hand Grip (lbs) 53      Ambulation/Gait   Ambulation/Gait Yes    Gait Pattern Decreased stride length;Step-through pattern;Shuffle    Gait Comments pt ambulated 370 ft n 2:33 before requiring seated rest break lasting remainder of walk test. HR 96 and O2 84% on supplemental O2 at 2.5 LPM            [facility] Pre-Surgical Assessment - 09/14/20 0001    5 Meter Walk Test- trial 1 5 sec    5 Meter Walk Test- trial 2 5 sec.     5 Meter Walk Test- trial 3 5 sec.    5 meter walk test average 5 sec    4 Stage Balance Test tolerated for:  10 sec.    4 Stage Balance Test Position 4    Sit To  Stand Test- trial 1 16 sec.    ADL/IADL Independent with: Dressing;Bathing;Meal prep;Finances    ADL/IADL Needs Assistance with: Pincus Badder work    ADL/IADL Fraility Index Vulnerable    6 Minute Walk- Baseline yes    BP (mmHg) 134/83    HR (bpm) 75    02 Sat (%RA) 99 %    Modified Borg Scale for Dyspnea 0- Nothing at all    Perceived Rate of Exertion (Borg) 6-    6 Minute Walk Post Test yes    BP (mmHg) 146/100    HR (bpm) 96    02 Sat (%RA) 84 %    Modified Borg Scale for Dyspnea 1- Very mild shortness of breath    Perceived Rate of Exertion (Borg) 13- Somewhat hard    Aerobic Endurance Distance Walked 370    Endurance additional comments Norm for 85-89 is 1368, pt is 89 which compard to that age range is 72.95% limited                    Objective measurements completed on examination: See above findings.                            Plan - 09/14/20 1050    Clinical Impression Statement See assessment in note    Clinical Decision Making Low    PT Frequency One time visit    PT Next Visit Plan pre TAVR evaluation              Clinical Impression Statement: Pt is a 87 yo M presenting to OP PT for evaluation prior to possible TAVR surgery due to severe aortic stenosis. Pt reports onset of SOB approximately 4 months ago. Symptoms are limiting prolonged walking/ standing . Pt presents with good ROM except for limited R shoulder AROM in all planes and good strength, good balance and is assessed as low at high fall risk 4 stage balance test, good walking speed and limited aerobic endurance per 6 minute walk test. pt ambulated 370 ft n 2:33 before requiring seated rest break lasting remainder of walk test. HR 96 and O2 84% on supplemental O2 at 2.5 LPM. Pt reported 1/10 shortness of breath on modified scale for dyspnea.Pt ambulated a total of 370 feet in 6 minute walk. SOB and fatigue increased significantly with 6  minute walk test. Based on the Short  Physical Performance Battery, patient has a frailty rating of 10/12 with </= 5/12 considered frail.   Patient demonstrated the following deficits and impairments:     Visit Diagnosis: Stiffness of right shoulder, not elsewhere classified  Other abnormalities of gait and mobility     Problem List Patient Active Problem List   Diagnosis Date Noted  . Mitral regurgitation   . COPD (chronic obstructive pulmonary disease) (HCC)   . Acute on chronic congestive heart failure (HCC)   . Severe aortic stenosis   . Acute on chronic diastolic CHF (congestive heart failure) (HCC) 07/15/2020  . COPD with acute exacerbation (HCC) 07/15/2020  . Atrial fibrillation, chronic (HCC) 07/15/2020   Lulu Riding PT, DPT, LAT, ATC  09/14/20  11:36 AM      [facility] Outpatient Rehabilitation [facility] [address] [place], [place], [address] Phone: [phone]   Fax:  [phone]  Name: Ruben [name] MRN: [id number] Date of Birth: February 19, 1927

## 2020-09-15 ENCOUNTER — Encounter (HOSPITAL_COMMUNITY): Payer: Self-pay | Admitting: Physician Assistant

## 2020-09-15 NOTE — Pre-Procedure Instructions (Signed)
Ruben [name]  09/15/2020    Your procedure is scheduled on Tues., Feb. 15, 2022 from 9:15AM-11:15AM  Report to [facility] Entrance "A" at 7:00AM  Call this number if you have problems the morning of surgery:  [phone]   Remember:  Do not eat or drink after midnight on Feb. 14th   Take these medicines the morning of surgery with A SIP OF WATER: NONE  Take last dose of Coumadin on 2/9, per Dr. Excell Seltzer  As of today, STOP taking all Aspirin (unless instructed by your doctor) and Other Aspirin containing products, Vitamins, Fish oils, and Herbal medications. Also stop all NSAIDS i.e. Advil, Ibuprofen, Motrin, Aleve, Anaprox, Naproxen, BC, Goody Powders, and all Supplements.   No Smoking of any kind, Tobacco/Vaping, or Alcohol products 24 hours prior to your procedure. If you use a Cpap at night, you may bring all equipment for your overnight stay.   Day of Surgery:  Do not wear jewelry.  Do not wear lotions, powders, colognes, or deodorant.  Do not shave 48 hours prior to surgery.  Men may shave face and neck.  Do not bring valuables to the hospital.  [facility] is not responsible for any belongings or valuables.  Contacts, dentures or bridgework may not be worn into surgery.   For patients admitted to the hospital, discharge time will be determined by your treatment team.  Patients discharged the day of surgery will not be allowed to drive home, and someone age 20 and over needs to stay with them for 24 hours.  Special instructions:   [facility]- Preparing For Surgery  Before surgery, you can play an important role. Because skin is not sterile, your skin needs to be as free of germs as possible. You can reduce the number of germs on your skin by washing with CHG (chlorahexidine gluconate) Soap before surgery.  CHG is an antiseptic cleaner which kills germs and bonds with the skin to continue killing germs even after washing.    Oral Hygiene is also important to  reduce your risk of infection.  Remember - BRUSH YOUR TEETH THE MORNING OF SURGERY WITH YOUR REGULAR TOOTHPASTE  Please do not use if you have an allergy to CHG or antibacterial soaps. If your skin becomes reddened/irritated stop using the CHG.  Do not shave (including legs and underarms) for at least 48 hours prior to first CHG shower. It is OK to shave your face.  Please follow these instructions carefully.   1. Shower the NIGHT BEFORE SURGERY and the MORNING OF SURGERY with CHG.   2. If you chose to wash your hair, wash your hair first as usual with your normal shampoo.  3. After you shampoo, rinse your hair and body thoroughly to remove the shampoo.  4. Use CHG as you would any other liquid soap. You can apply CHG directly to the skin and wash gently with a scrungie or a clean washcloth.   5. Apply the CHG Soap to your body ONLY FROM THE NECK DOWN.  Do not use on open wounds or open sores. Avoid contact with your eyes, ears, mouth and genitals (private parts). Wash Face and genitals (private parts)  with your normal soap.  6. Wash thoroughly, paying special attention to the area where your surgery will be performed.  7. Thoroughly rinse your body with warm water from the neck down.  8. DO NOT shower/wash with your normal soap after using and rinsing off the CHG Soap.  9. Pat yourself dry with a CLEAN TOWEL.  10. Wear CLEAN PAJAMAS to bed the night before surgery, wear comfortable clothes the morning of surgery  11. Place CLEAN SHEETS on your bed the night of your first shower and DO NOT SLEEP WITH PETS.  Reminders: Do not apply any deodorants/lotions.  Please wear clean clothes to the hospital/surgery center.   Remember to brush your teeth WITH YOUR REGULAR TOOTHPASTE.  Please read over the following fact sheets that you were given.

## 2020-09-16 ENCOUNTER — Other Ambulatory Visit (HOSPITAL_COMMUNITY)
Admission: RE | Admit: 2020-09-16 | Discharge: 2020-09-16 | Disposition: A | Discharge status: Home / Self Care | Payer: Medicare Other | Source: Ambulatory Visit | Attending: Cardiovascular Disease | Admitting: Cardiovascular Disease

## 2020-09-16 ENCOUNTER — Encounter (HOSPITAL_COMMUNITY)
Admission: RE | Admit: 2020-09-16 | Discharge: 2020-09-16 | Disposition: A | Discharge status: Home / Self Care | Payer: Medicare Other | Source: Ambulatory Visit | Attending: Cardiovascular Disease | Admitting: Cardiovascular Disease

## 2020-09-16 ENCOUNTER — Encounter (HOSPITAL_COMMUNITY): Payer: Self-pay

## 2020-09-16 ENCOUNTER — Other Ambulatory Visit: Payer: Self-pay

## 2020-09-16 ENCOUNTER — Other Ambulatory Visit (HOSPITAL_COMMUNITY): Payer: Medicare Other

## 2020-09-16 DIAGNOSIS — I517 Cardiomegaly: Secondary | ICD-10-CM | POA: Diagnosis not present

## 2020-09-16 DIAGNOSIS — I350 Nonrheumatic aortic (valve) stenosis: Secondary | ICD-10-CM | POA: Diagnosis not present

## 2020-09-16 DIAGNOSIS — J90 Pleural effusion, not elsewhere classified: Secondary | ICD-10-CM | POA: Diagnosis not present

## 2020-09-16 DIAGNOSIS — Z20822 Contact with and (suspected) exposure to covid-19: Secondary | ICD-10-CM | POA: Insufficient documentation

## 2020-09-16 DIAGNOSIS — Z01818 Encounter for other preprocedural examination: Secondary | ICD-10-CM | POA: Insufficient documentation

## 2020-09-16 LAB — COMPREHENSIVE METABOLIC PANEL
ALT: 12 U/L (ref 0–44)
AST: 17 U/L (ref 15–41)
Albumin: 3.7 g/dL (ref 3.5–5.0)
Alkaline Phosphatase: 74 U/L (ref 38–126)
Anion gap: 10 (ref 5–15)
BUN: 25 mg/dL — ABNORMAL HIGH (ref 8–23)
CO2: 29 mmol/L (ref 22–32)
Calcium: 8.9 mg/dL (ref 8.9–10.3)
Chloride: 99 mmol/L (ref 98–111)
Creatinine, Ser: 1 mg/dL (ref 0.61–1.24)
GFR, Estimated: >60 mL/min (ref >60)
Glucose, Bld: 94 mg/dL (ref 70–99)
Potassium: 3.8 mmol/L (ref 3.5–5.1)
Sodium: 138 mmol/L (ref 135–145)
Total Bilirubin: 1.3 mg/dL — ABNORMAL HIGH (ref 0.3–1.2)
Total Protein: 6.9 g/dL (ref 6.5–8.1)

## 2020-09-16 LAB — CBC
HCT: 39.5 % (ref 39.0–52.0)
Hemoglobin: 13 g/dL (ref 13.0–17.0)
MCH: 33 pg (ref 26.0–34.0)
MCHC: 32.9 g/dL (ref 30.0–36.0)
MCV: 100.3 fL — ABNORMAL HIGH (ref 80.0–100.0)
Platelets: 198 10*3/uL (ref 150–400)
RBC: 3.94 MIL/uL — ABNORMAL LOW (ref 4.22–5.81)
RDW: 15 % (ref 11.5–15.5)
WBC: 7 10*3/uL (ref 4.0–10.5)
nRBC: 0 % (ref 0.0–0.2)

## 2020-09-16 LAB — URINALYSIS, ROUTINE W REFLEX MICROSCOPIC
Bacteria, UA: NONE SEEN
Bilirubin Urine: NEGATIVE
Glucose, UA: NEGATIVE mg/dL
Ketones, ur: NEGATIVE mg/dL
Leukocytes,Ua: NEGATIVE
Nitrite: NEGATIVE
Protein, ur: NEGATIVE mg/dL
Specific Gravity, Urine: 1.006 (ref 1.005–1.030)
pH: 7 (ref 5.0–8.0)

## 2020-09-16 LAB — HEMOGLOBIN A1C
Hgb A1c MFr Bld: 5.5 % (ref 4.8–5.6)
Mean Plasma Glucose: 111.15 mg/dL

## 2020-09-16 LAB — SURGICAL PCR SCREEN
MRSA, PCR: NEGATIVE
Staphylococcus aureus: NEGATIVE

## 2020-09-16 LAB — SARS CORONAVIRUS 2 (TAT 6-24 HRS): SARS Coronavirus 2: NEGATIVE

## 2020-09-16 NOTE — Progress Notes (Signed)
PCP - Dr. Alfonso Ramus  Cardiologist - Tommi Rumps. W. O'Neal  Chest x-ray - 09/16/20  EKG - 09/16/20  Stress Test - Denies  ECHO - 09/07/20 (E)  Cardiac Cath - 08/24/20 (E)  AICD-na PM-na LOOP-na  Sleep Study - Denies CPAP - Denies  LABS- 09/16/20: CBC, CMO, T/S, UA, COVID 09/20/20: PT, PTT  ASA- Denies Coumadin- LD- 2/9  ERAS- No  HA1C- 09/16/20, per order pt is not a diabetic  Anesthesia- Yes- cardiac history  Pt denies having chest pain, sob, or fever at this time. All instructions explained to the pt, with a verbal understanding of the material. Pt agrees to go over the instructions while at home for a better understanding. Pt also instructed to self quarantine after being tested for COVID-19. The opportunity to ask questions was provided.   Coronavirus Screening  Have you experienced the following symptoms:  Cough yes/no: No Fever (>100.61F)  yes/no: No Runny nose yes/no: No Sore throat yes/no: No Difficulty breathing/shortness of breath  yes/no: No  Have you or a family member traveled in the last 14 days and where? yes/no: No   If the patient indicates "YES" to the above questions, their PAT will be rescheduled to limit the exposure to others and, the surgeon will be notified. THE PATIENT WILL NEED TO BE ASYMPTOMATIC FOR 14 DAYS.   If the patient is not experiencing any of these symptoms, the PAT nurse will instruct them to NOT bring anyone with them to their appointment since they may have these symptoms or traveled as well.   Please remind your patients and families that hospital visitation restrictions are in effect and the importance of the restrictions.

## 2020-09-19 MED ORDER — VANCOMYCIN HCL 1500 MG/300ML IV SOLN
1500.0000 mg | INTRAVENOUS | Status: DC
  Filled 2020-09-19 (×2): qty 300

## 2020-09-19 MED ORDER — SODIUM CHLORIDE 0.9 % IV SOLN
INTRAVENOUS | Status: DC
  Filled 2020-09-19: qty 30

## 2020-09-19 MED ORDER — DEXMEDETOMIDINE HCL IN NACL 400 MCG/100ML IV SOLN
0.1000 ug/kg/h | INTRAVENOUS | Status: DC
  Filled 2020-09-19 (×2): qty 100

## 2020-09-19 MED ORDER — NOREPINEPHRINE 4 MG/250ML-% IV SOLN
0.0000 ug/min | INTRAVENOUS | Status: DC
  Filled 2020-09-19: qty 250

## 2020-09-19 MED ORDER — MAGNESIUM SULFATE 50 % IJ SOLN
40.0000 meq | INTRAMUSCULAR | Status: DC
  Filled 2020-09-19: qty 9.85

## 2020-09-19 MED ORDER — SODIUM CHLORIDE 0.9 % IV SOLN
1.5000 g | INTRAVENOUS | Status: DC
  Filled 2020-09-19: qty 1.5

## 2020-09-19 MED ORDER — POTASSIUM CHLORIDE 2 MEQ/ML IV SOLN
80.0000 meq | INTRAVENOUS | Status: DC
  Filled 2020-09-19: qty 40

## 2020-09-20 ENCOUNTER — Inpatient Hospital Stay (HOSPITAL_COMMUNITY): Payer: Medicare Other

## 2020-09-20 ENCOUNTER — Inpatient Hospital Stay (HOSPITAL_COMMUNITY): Admission: RE | Admit: 2020-09-20 | Payer: Medicare Other | Source: Home / Self Care | Admitting: Cardiovascular Disease

## 2020-09-20 ENCOUNTER — Other Ambulatory Visit: Payer: Self-pay

## 2020-09-20 ENCOUNTER — Inpatient Hospital Stay (HOSPITAL_COMMUNITY): Payer: Medicare Other | Admitting: Orthopedic Surgery

## 2020-09-20 ENCOUNTER — Encounter (HOSPITAL_COMMUNITY)
Admission: EM | Disposition: A | Discharge status: Skilled Nursing Facility | Payer: Self-pay | Source: Home / Self Care | Attending: Internal Medicine

## 2020-09-20 ENCOUNTER — Other Ambulatory Visit (HOSPITAL_COMMUNITY): Payer: Medicare Other

## 2020-09-20 ENCOUNTER — Emergency Department (HOSPITAL_COMMUNITY): Payer: Medicare Other

## 2020-09-20 ENCOUNTER — Inpatient Hospital Stay (HOSPITAL_COMMUNITY)
Admission: EM | Admit: 2020-09-20 | Discharge: 2020-09-23 | DRG: 481 | Disposition: A | Discharge status: Skilled Nursing Facility | Payer: Medicare Other | Attending: Internal Medicine | Admitting: Internal Medicine

## 2020-09-20 ENCOUNTER — Encounter (HOSPITAL_COMMUNITY): Payer: Self-pay | Admitting: Internal Medicine

## 2020-09-20 DIAGNOSIS — N400 Enlarged prostate without lower urinary tract symptoms: Secondary | ICD-10-CM | POA: Diagnosis present

## 2020-09-20 DIAGNOSIS — D539 Nutritional anemia, unspecified: Secondary | ICD-10-CM | POA: Diagnosis present

## 2020-09-20 DIAGNOSIS — S72102A Unspecified trochanteric fracture of left femur, initial encounter for closed fracture: Secondary | ICD-10-CM | POA: Diagnosis not present

## 2020-09-20 DIAGNOSIS — J449 Chronic obstructive pulmonary disease, unspecified: Secondary | ICD-10-CM | POA: Diagnosis present

## 2020-09-20 DIAGNOSIS — Z9889 Other specified postprocedural states: Secondary | ICD-10-CM

## 2020-09-20 DIAGNOSIS — Z7901 Long term (current) use of anticoagulants: Secondary | ICD-10-CM | POA: Diagnosis not present

## 2020-09-20 DIAGNOSIS — Y92009 Unspecified place in unspecified non-institutional (private) residence as the place of occurrence of the external cause: Secondary | ICD-10-CM

## 2020-09-20 DIAGNOSIS — Z87891 Personal history of nicotine dependence: Secondary | ICD-10-CM | POA: Diagnosis not present

## 2020-09-20 DIAGNOSIS — W19XXXA Unspecified fall, initial encounter: Secondary | ICD-10-CM

## 2020-09-20 DIAGNOSIS — Z96653 Presence of artificial knee joint, bilateral: Secondary | ICD-10-CM | POA: Diagnosis present

## 2020-09-20 DIAGNOSIS — Z9981 Dependence on supplemental oxygen: Secondary | ICD-10-CM

## 2020-09-20 DIAGNOSIS — W109XXA Fall (on) (from) unspecified stairs and steps, initial encounter: Secondary | ICD-10-CM | POA: Diagnosis present

## 2020-09-20 DIAGNOSIS — Z20822 Contact with and (suspected) exposure to covid-19: Secondary | ICD-10-CM | POA: Diagnosis present

## 2020-09-20 DIAGNOSIS — E860 Dehydration: Secondary | ICD-10-CM | POA: Diagnosis present

## 2020-09-20 DIAGNOSIS — I350 Nonrheumatic aortic (valve) stenosis: Secondary | ICD-10-CM

## 2020-09-20 DIAGNOSIS — J9611 Chronic respiratory failure with hypoxia: Secondary | ICD-10-CM | POA: Diagnosis present

## 2020-09-20 DIAGNOSIS — I083 Combined rheumatic disorders of mitral, aortic and tricuspid valves: Secondary | ICD-10-CM | POA: Diagnosis present

## 2020-09-20 DIAGNOSIS — I110 Hypertensive heart disease with heart failure: Secondary | ICD-10-CM | POA: Diagnosis present

## 2020-09-20 DIAGNOSIS — I482 Chronic atrial fibrillation, unspecified: Secondary | ICD-10-CM | POA: Diagnosis not present

## 2020-09-20 DIAGNOSIS — I4811 Longstanding persistent atrial fibrillation: Secondary | ICD-10-CM | POA: Diagnosis present

## 2020-09-20 DIAGNOSIS — S72145A Nondisplaced intertrochanteric fracture of left femur, initial encounter for closed fracture: Principal | ICD-10-CM | POA: Diagnosis present

## 2020-09-20 DIAGNOSIS — Z79899 Other long term (current) drug therapy: Secondary | ICD-10-CM

## 2020-09-20 DIAGNOSIS — Z8781 Personal history of (healed) traumatic fracture: Secondary | ICD-10-CM

## 2020-09-20 DIAGNOSIS — J431 Panlobular emphysema: Secondary | ICD-10-CM | POA: Diagnosis not present

## 2020-09-20 DIAGNOSIS — I5032 Chronic diastolic (congestive) heart failure: Secondary | ICD-10-CM | POA: Diagnosis present

## 2020-09-20 DIAGNOSIS — Z419 Encounter for procedure for purposes other than remedying health state, unspecified: Secondary | ICD-10-CM

## 2020-09-20 HISTORY — PX: INTRAMEDULLARY (IM) NAIL INTERTROCHANTERIC: SHX5875

## 2020-09-20 LAB — PROTIME-INR
INR: 1.1 (ref 0.8–1.2)
Prothrombin Time: 13.6 seconds (ref 11.4–15.2)

## 2020-09-20 LAB — CBC WITH DIFFERENTIAL/PLATELET
Abs Immature Granulocytes: 0.02 10*3/uL (ref 0.00–0.07)
Basophils Absolute: 0.1 10*3/uL (ref 0.0–0.1)
Basophils Relative: 1 %
Eosinophils Absolute: 0.1 10*3/uL (ref 0.0–0.5)
Eosinophils Relative: 1 %
HCT: 40.9 % (ref 39.0–52.0)
Hemoglobin: 12.6 g/dL — ABNORMAL LOW (ref 13.0–17.0)
Immature Granulocytes: 0 %
Lymphocytes Relative: 10 %
Lymphs Abs: 0.8 10*3/uL (ref 0.7–4.0)
MCH: 31.8 pg (ref 26.0–34.0)
MCHC: 30.8 g/dL (ref 30.0–36.0)
MCV: 103.3 fL — ABNORMAL HIGH (ref 80.0–100.0)
Monocytes Absolute: 0.5 10*3/uL (ref 0.1–1.0)
Monocytes Relative: 7 %
Neutro Abs: 6.2 10*3/uL (ref 1.7–7.7)
Neutrophils Relative %: 81 %
Platelets: 179 10*3/uL (ref 150–400)
RBC: 3.96 MIL/uL — ABNORMAL LOW (ref 4.22–5.81)
RDW: 14.9 % (ref 11.5–15.5)
WBC: 7.7 10*3/uL (ref 4.0–10.5)
nRBC: 0 % (ref 0.0–0.2)

## 2020-09-20 LAB — COMPREHENSIVE METABOLIC PANEL
ALT: 12 U/L (ref 0–44)
AST: 16 U/L (ref 15–41)
Albumin: 3.5 g/dL (ref 3.5–5.0)
Alkaline Phosphatase: 71 U/L (ref 38–126)
Anion gap: 9 (ref 5–15)
BUN: 28 mg/dL — ABNORMAL HIGH (ref 8–23)
CO2: 27 mmol/L (ref 22–32)
Calcium: 9 mg/dL (ref 8.9–10.3)
Chloride: 105 mmol/L (ref 98–111)
Creatinine, Ser: 1.03 mg/dL (ref 0.61–1.24)
GFR, Estimated: >60 mL/min (ref >60)
Glucose, Bld: 132 mg/dL — ABNORMAL HIGH (ref 70–99)
Potassium: 4.5 mmol/L (ref 3.5–5.1)
Sodium: 141 mmol/L (ref 135–145)
Total Bilirubin: 1.4 mg/dL — ABNORMAL HIGH (ref 0.3–1.2)
Total Protein: 6.7 g/dL (ref 6.5–8.1)

## 2020-09-20 LAB — TYPE AND SCREEN
ABO/RH(D): O POS
ABO/RH(D): O POS
Antibody Screen: NEGATIVE
Antibody Screen: NEGATIVE

## 2020-09-20 LAB — SARS CORONAVIRUS 2 (TAT 6-24 HRS): SARS Coronavirus 2: NEGATIVE

## 2020-09-20 SURGERY — IMPLANTATION, AORTIC VALVE, TRANSCATHETER, FEMORAL APPROACH
Anesthesia: General

## 2020-09-20 SURGERY — FIXATION, FRACTURE, INTERTROCHANTERIC, WITH INTRAMEDULLARY ROD
Anesthesia: General | Laterality: Left

## 2020-09-20 MED ORDER — LIDOCAINE 2% (20 MG/ML) 5 ML SYRINGE
INTRAMUSCULAR | Status: DC | PRN
  Administered 2020-09-20: 50 mg via INTRAVENOUS

## 2020-09-20 MED ORDER — ALUM & MAG HYDROXIDE-SIMETH 200-200-20 MG/5ML PO SUSP: 30.0000 mL | ORAL | Status: DC | PRN

## 2020-09-20 MED ORDER — PHENOL 1.4 % MT LIQD: 1.0000 | OROMUCOSAL | Status: DC | PRN

## 2020-09-20 MED ORDER — ONDANSETRON HCL 4 MG/2ML IJ SOLN: 4.0000 mg | Freq: Four times a day (QID) | INTRAMUSCULAR | Status: DC | PRN

## 2020-09-20 MED ORDER — MAGNESIUM CITRATE PO SOLN: 1.0000 | Freq: Once | ORAL | Status: DC | PRN

## 2020-09-20 MED ORDER — ACETAMINOPHEN 325 MG PO TABS: 325.0000 mg | ORAL_TABLET | Freq: Four times a day (QID) | ORAL | Status: DC | PRN

## 2020-09-20 MED ORDER — ALBUMIN HUMAN 5 % IV SOLN
INTRAVENOUS | Status: DC | PRN
Start: 2020-09-20 — End: 2020-09-20

## 2020-09-20 MED ORDER — LEVALBUTEROL HCL 0.63 MG/3ML IN NEBU
0.6300 mg | INHALATION_SOLUTION | Freq: Two times a day (BID) | RESPIRATORY_TRACT | Status: DC
  Administered 2020-09-20 – 2020-09-21 (×4): 0.63 mg via RESPIRATORY_TRACT
  Filled 2020-09-20 (×5): qty 3

## 2020-09-20 MED ORDER — POLYETHYLENE GLYCOL 3350 17 G PO PACK: 17.0000 g | PACK | Freq: Every day | ORAL | Status: DC | PRN

## 2020-09-20 MED ORDER — SUGAMMADEX SODIUM 200 MG/2ML IV SOLN
INTRAVENOUS | Status: DC | PRN
  Administered 2020-09-20: 200 mg via INTRAVENOUS

## 2020-09-20 MED ORDER — MORPHINE SULFATE (PF) 4 MG/ML IV SOLN
4.0000 mg | Freq: Once | INTRAVENOUS | Status: AC
Start: 2020-09-20 — End: 2020-09-20
  Administered 2020-09-20: 4 mg via INTRAVENOUS
  Filled 2020-09-20: qty 1

## 2020-09-20 MED ORDER — FENTANYL CITRATE (PF) 250 MCG/5ML IJ SOLN
INTRAMUSCULAR | Status: DC | PRN
  Administered 2020-09-20: 25 ug via INTRAVENOUS
  Administered 2020-09-20: 50 ug via INTRAVENOUS
  Administered 2020-09-20: 25 ug via INTRAVENOUS
  Administered 2020-09-20 (×2): 50 ug via INTRAVENOUS

## 2020-09-20 MED ORDER — SODIUM CHLORIDE 0.9 % IV SOLN: Freq: Once | INTRAVENOUS | Status: AC

## 2020-09-20 MED ORDER — TORSEMIDE 20 MG PO TABS
20.0000 mg | ORAL_TABLET | Freq: Every day | ORAL | Status: DC
  Administered 2020-09-21 – 2020-09-23 (×3): 20 mg via ORAL
  Filled 2020-09-20 (×3): qty 1

## 2020-09-20 MED ORDER — LACTATED RINGERS IV SOLN: INTRAVENOUS | Status: DC | PRN

## 2020-09-20 MED ORDER — SENNA 8.6 MG PO TABS
1.0000 | ORAL_TABLET | Freq: Two times a day (BID) | ORAL | Status: DC
  Administered 2020-09-21 – 2020-09-23 (×5): 8.6 mg via ORAL
  Filled 2020-09-20 (×5): qty 1

## 2020-09-20 MED ORDER — ROCURONIUM BROMIDE 10 MG/ML (PF) SYRINGE
PREFILLED_SYRINGE | INTRAVENOUS | Status: DC | PRN
  Administered 2020-09-20: 50 mg via INTRAVENOUS

## 2020-09-20 MED ORDER — DOCUSATE SODIUM 100 MG PO CAPS
100.0000 mg | ORAL_CAPSULE | Freq: Two times a day (BID) | ORAL | Status: DC
  Administered 2020-09-20 – 2020-09-23 (×6): 100 mg via ORAL
  Filled 2020-09-20 (×6): qty 1

## 2020-09-20 MED ORDER — CEFAZOLIN SODIUM-DEXTROSE 2-4 GM/100ML-% IV SOLN
2.0000 g | INTRAVENOUS | Status: AC
  Administered 2020-09-20: 2 g via INTRAVENOUS
  Filled 2020-09-20 (×2): qty 100

## 2020-09-20 MED ORDER — MENTHOL 3 MG MT LOZG: 1.0000 | LOZENGE | OROMUCOSAL | Status: DC | PRN

## 2020-09-20 MED ORDER — FENTANYL CITRATE (PF) 100 MCG/2ML IJ SOLN
50.0000 ug | INTRAMUSCULAR | Status: AC | PRN
  Administered 2020-09-20 (×2): 50 ug via INTRAVENOUS
  Filled 2020-09-20 (×2): qty 2

## 2020-09-20 MED ORDER — TRANEXAMIC ACID-NACL 1000-0.7 MG/100ML-% IV SOLN
1000.0000 mg | Freq: Once | INTRAVENOUS | Status: DC
  Filled 2020-09-20: qty 100

## 2020-09-20 MED ORDER — DUTASTERIDE 0.5 MG PO CAPS
0.5000 mg | ORAL_CAPSULE | Freq: Every day | ORAL | Status: DC
  Administered 2020-09-20 – 2020-09-23 (×4): 0.5 mg via ORAL
  Filled 2020-09-20 (×4): qty 1

## 2020-09-20 MED ORDER — DICLOFENAC SODIUM 1 % EX GEL: 1.0000 "application " | Freq: Four times a day (QID) | CUTANEOUS | Status: DC | PRN

## 2020-09-20 MED ORDER — ASPIRIN 325 MG PO TABS
325.0000 mg | ORAL_TABLET | Freq: Once | ORAL | Status: AC
  Administered 2020-09-21: 325 mg via ORAL
  Filled 2020-09-20: qty 1

## 2020-09-20 MED ORDER — PHENYLEPHRINE HCL-NACL 10-0.9 MG/250ML-% IV SOLN
INTRAVENOUS | Status: DC | PRN
  Administered 2020-09-20: 25 ug/min via INTRAVENOUS

## 2020-09-20 MED ORDER — POVIDONE-IODINE 10 % EX SWAB
2.0000 "application " | Freq: Once | CUTANEOUS | Status: AC
  Administered 2020-09-20: 2 via TOPICAL

## 2020-09-20 MED ORDER — HYDROCODONE-ACETAMINOPHEN 5-325 MG PO TABS
1.0000 | ORAL_TABLET | Freq: Four times a day (QID) | ORAL | Status: DC | PRN
Start: 2020-09-20 — End: 2020-09-23
  Administered 2020-09-21 – 2020-09-23 (×5): 1 via ORAL
  Filled 2020-09-20 (×7): qty 1

## 2020-09-20 MED ORDER — HYDROCODONE-ACETAMINOPHEN 5-325 MG PO TABS
1.0000 | ORAL_TABLET | Freq: Four times a day (QID) | ORAL | Status: DC | PRN
  Administered 2020-09-20: 2 via ORAL
  Filled 2020-09-20: qty 2

## 2020-09-20 MED ORDER — ONDANSETRON HCL 4 MG/2ML IJ SOLN
4.0000 mg | Freq: Once | INTRAMUSCULAR | Status: AC
  Administered 2020-09-20: 4 mg via INTRAVENOUS
  Filled 2020-09-20: qty 2

## 2020-09-20 MED ORDER — SODIUM CHLORIDE 0.9 % IV SOLN: INTRAVENOUS | Status: DC

## 2020-09-20 MED ORDER — TRANEXAMIC ACID-NACL 1000-0.7 MG/100ML-% IV SOLN
1000.0000 mg | INTRAVENOUS | Status: AC
  Administered 2020-09-20: 1000 mg via INTRAVENOUS
  Filled 2020-09-20: qty 100

## 2020-09-20 MED ORDER — WARFARIN - PHARMACIST DOSING INPATIENT: Freq: Every day | Status: DC

## 2020-09-20 MED ORDER — ASPIRIN 325 MG PO TABS: 325.0000 mg | ORAL_TABLET | Freq: Once | ORAL | Status: DC

## 2020-09-20 MED ORDER — DOCUSATE SODIUM 100 MG PO CAPS: 100.0000 mg | ORAL_CAPSULE | Freq: Two times a day (BID) | ORAL | Status: DC | PRN

## 2020-09-20 MED ORDER — CHLORHEXIDINE GLUCONATE 4 % EX LIQD: 60.0000 mL | Freq: Once | CUTANEOUS | Status: DC

## 2020-09-20 MED ORDER — ADULT MULTIVITAMIN W/MINERALS CH
1.0000 | ORAL_TABLET | Freq: Every day | ORAL | Status: DC
  Administered 2020-09-21 – 2020-09-23 (×3): 1 via ORAL
  Filled 2020-09-20 (×3): qty 1

## 2020-09-20 MED ORDER — WARFARIN SODIUM 6 MG PO TABS
6.0000 mg | ORAL_TABLET | Freq: Every day | ORAL | Status: DC
  Administered 2020-09-21 – 2020-09-22 (×2): 6 mg via ORAL
  Filled 2020-09-20 (×3): qty 1

## 2020-09-20 MED ORDER — PHENYLEPHRINE 40 MCG/ML (10ML) SYRINGE FOR IV PUSH (FOR BLOOD PRESSURE SUPPORT)
PREFILLED_SYRINGE | INTRAVENOUS | Status: DC | PRN
  Administered 2020-09-20 (×13): 80 ug via INTRAVENOUS

## 2020-09-20 MED ORDER — ONDANSETRON HCL 4 MG/2ML IJ SOLN
INTRAMUSCULAR | Status: DC | PRN
  Administered 2020-09-20: 4 mg via INTRAVENOUS

## 2020-09-20 MED ORDER — MORPHINE SULFATE (PF) 2 MG/ML IV SOLN
0.5000 mg | INTRAVENOUS | Status: DC | PRN
  Administered 2020-09-20: 0.5 mg via INTRAVENOUS
  Filled 2020-09-20: qty 1

## 2020-09-20 MED ORDER — FENTANYL CITRATE (PF) 100 MCG/2ML IJ SOLN
INTRAMUSCULAR | Status: AC
  Administered 2020-09-20: 25 ug via INTRAVENOUS
  Filled 2020-09-20: qty 2

## 2020-09-20 MED ORDER — MONTELUKAST SODIUM 10 MG PO TABS
10.0000 mg | ORAL_TABLET | Freq: Every day | ORAL | Status: DC
  Administered 2020-09-20 – 2020-09-23 (×4): 10 mg via ORAL
  Filled 2020-09-20 (×3): qty 1

## 2020-09-20 MED ORDER — PROPOFOL 10 MG/ML IV BOLUS
INTRAVENOUS | Status: AC
  Filled 2020-09-20: qty 20

## 2020-09-20 MED ORDER — GUAIFENESIN ER 600 MG PO TB12
600.0000 mg | ORAL_TABLET | Freq: Every day | ORAL | Status: DC | PRN
  Administered 2020-09-20: 600 mg via ORAL
  Filled 2020-09-20: qty 1

## 2020-09-20 MED ORDER — CHLORHEXIDINE GLUCONATE 0.12 % MT SOLN
15.0000 mL | Freq: Once | OROMUCOSAL | Status: AC
  Administered 2020-09-20: 15 mL via OROMUCOSAL

## 2020-09-20 MED ORDER — ALPRAZOLAM 0.25 MG PO TABS
0.2500 mg | ORAL_TABLET | Freq: Every evening | ORAL | Status: DC | PRN
  Administered 2020-09-21: 0.25 mg via ORAL
  Filled 2020-09-20: qty 1

## 2020-09-20 MED ORDER — LACTATED RINGERS IV SOLN: INTRAVENOUS | Status: DC

## 2020-09-20 MED ORDER — TAMSULOSIN HCL 0.4 MG PO CAPS
0.8000 mg | ORAL_CAPSULE | Freq: Every day | ORAL | Status: DC
  Administered 2020-09-20 – 2020-09-22 (×3): 0.8 mg via ORAL
  Filled 2020-09-20 (×3): qty 2

## 2020-09-20 MED ORDER — ENSURE ENLIVE PO LIQD
237.0000 mL | Freq: Two times a day (BID) | ORAL | Status: DC
  Administered 2020-09-21 – 2020-09-23 (×5): 237 mL via ORAL

## 2020-09-20 MED ORDER — FENTANYL CITRATE (PF) 100 MCG/2ML IJ SOLN
25.0000 ug | INTRAMUSCULAR | Status: DC | PRN
  Administered 2020-09-20: 25 ug via INTRAVENOUS

## 2020-09-20 MED ORDER — ACETAMINOPHEN 500 MG PO TABS
500.0000 mg | ORAL_TABLET | Freq: Four times a day (QID) | ORAL | Status: AC
  Administered 2020-09-20 – 2020-09-21 (×4): 500 mg via ORAL
  Filled 2020-09-20 (×4): qty 1

## 2020-09-20 MED ORDER — TRAMADOL HCL 50 MG PO TABS
50.0000 mg | ORAL_TABLET | Freq: Four times a day (QID) | ORAL | Status: DC | PRN
  Administered 2020-09-21 – 2020-09-23 (×3): 50 mg via ORAL
  Filled 2020-09-20 (×3): qty 1

## 2020-09-20 MED ORDER — METOPROLOL SUCCINATE ER 50 MG PO TB24
50.0000 mg | ORAL_TABLET | Freq: Every day | ORAL | Status: DC
  Administered 2020-09-20 – 2020-09-23 (×4): 50 mg via ORAL
  Filled 2020-09-20 (×4): qty 1

## 2020-09-20 MED ORDER — ONDANSETRON HCL 4 MG/2ML IJ SOLN: 4.0000 mg | Freq: Once | INTRAMUSCULAR | Status: DC | PRN

## 2020-09-20 MED ORDER — ONDANSETRON HCL 4 MG PO TABS: 4.0000 mg | ORAL_TABLET | Freq: Four times a day (QID) | ORAL | Status: DC | PRN

## 2020-09-20 MED ORDER — SODIUM CHLORIDE 0.9 % IR SOLN
Status: DC | PRN
  Administered 2020-09-20: 1000 mL

## 2020-09-20 MED ORDER — CEFAZOLIN SODIUM-DEXTROSE 2-4 GM/100ML-% IV SOLN
2.0000 g | Freq: Four times a day (QID) | INTRAVENOUS | Status: AC
  Administered 2020-09-20 – 2020-09-21 (×2): 2 g via INTRAVENOUS
  Filled 2020-09-20 (×2): qty 100

## 2020-09-20 MED ORDER — BISACODYL 10 MG RE SUPP
10.0000 mg | Freq: Every day | RECTAL | Status: DC | PRN
  Administered 2020-09-23: 10 mg via RECTAL
  Filled 2020-09-20: qty 1

## 2020-09-20 MED ORDER — ORAL CARE MOUTH RINSE: 15.0000 mL | Freq: Once | OROMUCOSAL | Status: AC

## 2020-09-20 MED ORDER — DEXAMETHASONE SODIUM PHOSPHATE 10 MG/ML IJ SOLN
INTRAMUSCULAR | Status: DC | PRN
  Administered 2020-09-20: 5 mg via INTRAVENOUS

## 2020-09-20 MED ORDER — METHOCARBAMOL 500 MG PO TABS
500.0000 mg | ORAL_TABLET | Freq: Four times a day (QID) | ORAL | Status: DC | PRN
  Administered 2020-09-20 – 2020-09-22 (×7): 500 mg via ORAL
  Filled 2020-09-20 (×7): qty 1

## 2020-09-20 MED ORDER — FENTANYL CITRATE (PF) 250 MCG/5ML IJ SOLN
INTRAMUSCULAR | Status: AC
  Filled 2020-09-20: qty 5

## 2020-09-20 SURGICAL SUPPLY — 50 items
BIT DRILL AO GAMMA 4.2X180 (BIT) ×1 IMPLANT
CHLORAPREP W/TINT 26 (MISCELLANEOUS) ×1 IMPLANT
CLSR STERI-STRIP ANTIMIC 1/2X4 (GAUZE/BANDAGES/DRESSINGS) ×1 IMPLANT
COVER MAYO STAND STRL (DRAPES) ×2 IMPLANT
COVER PERINEAL POST (MISCELLANEOUS) ×2 IMPLANT
COVER SURGICAL LIGHT HANDLE (MISCELLANEOUS) ×2 IMPLANT
COVER WAND RF STERILE (DRAPES) ×1 IMPLANT
DRAPE STERI IOBAN 125X83 (DRAPES) ×2 IMPLANT
DRSG MEPILEX BORDER 4X4 (GAUZE/BANDAGES/DRESSINGS) ×6 IMPLANT
DURAPREP 26ML APPLICATOR (WOUND CARE) ×1 IMPLANT
ELECT REM PT RETURN 9FT ADLT (ELECTROSURGICAL) ×2
ELECTRODE REM PT RTRN 9FT ADLT (ELECTROSURGICAL) ×1 IMPLANT
GLOVE BIO SURGEON STRL SZ7.5 (GLOVE) ×2 IMPLANT
GLOVE BIOGEL PI IND STRL 7.5 (GLOVE) ×1 IMPLANT
GLOVE BIOGEL PI INDICATOR 7.5 (GLOVE) ×1
GLOVE SRG 8 PF TXTR STRL LF DI (GLOVE) ×1 IMPLANT
GLOVE SURG SYN 7.5  E (GLOVE) ×1
GLOVE SURG SYN 7.5 E (GLOVE) ×1 IMPLANT
GLOVE SURG SYN 7.5 PF PI (GLOVE) ×1 IMPLANT
GLOVE SURG UNDER POLY LF SZ8 (GLOVE) ×1
GOWN STRL REUS W/ TWL LRG LVL3 (GOWN DISPOSABLE) ×2 IMPLANT
GOWN STRL REUS W/ TWL XL LVL3 (GOWN DISPOSABLE) ×2 IMPLANT
GOWN STRL REUS W/TWL LRG LVL3 (GOWN DISPOSABLE) ×2
GOWN STRL REUS W/TWL XL LVL3 (GOWN DISPOSABLE) ×2
GUIDEROD T2 3X1000 (ROD) ×1 IMPLANT
K-WIRE  3.2X450M STR (WIRE) ×1
K-WIRE 3.2X450M STR (WIRE) ×1
KIT BASIN OR (CUSTOM PROCEDURE TRAY) ×2 IMPLANT
KIT NAIL LONG 10X380MM (Orthopedic Implant) ×1 IMPLANT
KIT TURNOVER KIT B (KITS) ×2 IMPLANT
KWIRE 3.2X450M STR (WIRE) IMPLANT
MANIFOLD NEPTUNE II (INSTRUMENTS) ×1 IMPLANT
NS IRRIG 1000ML POUR BTL (IV SOLUTION) ×2 IMPLANT
PACK GENERAL/GYN (CUSTOM PROCEDURE TRAY) ×2 IMPLANT
PAD ARMBOARD 7.5X6 YLW CONV (MISCELLANEOUS) ×4 IMPLANT
REAMER SHAFT BIXCUT (INSTRUMENTS) ×1 IMPLANT
SCREW LAG GAMMA 3 95MM (Screw) ×1 IMPLANT
SCREW LOCKING T2 F/T  5MMX45MM (Screw) ×1 IMPLANT
SCREW LOCKING T2 F/T 5MMX45MM (Screw) IMPLANT
STRIP CLOSURE SKIN 1/2X4 (GAUZE/BANDAGES/DRESSINGS) ×1 IMPLANT
SUT MNCRL AB 3-0 PS2 27 (SUTURE) ×1 IMPLANT
SUT MNCRL AB 4-0 PS2 18 (SUTURE) ×2 IMPLANT
SUT MON AB 2-0 CT1 36 (SUTURE) ×1 IMPLANT
SUT VIC AB 0 CT1 27 (SUTURE) ×1
SUT VIC AB 0 CT1 27XBRD ANBCTR (SUTURE) IMPLANT
SUT VIC AB 2-0 CT1 27 (SUTURE) ×2
SUT VIC AB 2-0 CT1 TAPERPNT 27 (SUTURE) ×1 IMPLANT
TOWEL GREEN STERILE (TOWEL DISPOSABLE) ×2 IMPLANT
TOWEL OR NON WOVEN STRL DISP B (DISPOSABLE) ×2 IMPLANT
WATER STERILE IRR 1000ML POUR (IV SOLUTION) ×2 IMPLANT

## 2020-09-20 NOTE — Progress Notes (Signed)
ANTICOAGULATION CONSULT NOTE - Initial Consult  Pharmacy Consult for Warfarin Indication: atrial fibrillation  No Known Allergies  Patient Measurements:     Vital Signs: Temp: 97.9 F (36.6 C) (02/15 0803) Temp Source: Oral (02/15 0803) BP: 120/75 (02/15 0900) Pulse Rate: 96 (02/15 0900)  Labs: Recent Labs    09/20/20 0803  HGB 12.6*  HCT 40.9  PLT 179  LABPROT 13.6  INR 1.1  CREATININE 1.03    Estimated Creatinine Clearance: 48.4 mL/min (by C-G formula based on SCr of 1.03 mg/dL).   Medical History: Past Medical History:  Diagnosis Date  . Arthritis   . CHF (congestive heart failure) (HCC)   . COPD (chronic obstructive pulmonary disease) (HCC)   . Mitral regurgitation   . Persistent atrial fibrillation (HCC)    on coumadin   . RBBB   . Severe aortic stenosis     Assessment: 87 yo M with persistent atrial fibrillation on warfarin PTA. Was scheduled for TAVR on 2/15 but had a mechanical fall on his way to the hospital resulting in femur fracture requiring L hip surgery. Warfarin has been on hold in anticipation for TAVR, with last dose on 2/11. INR 1.1 on admit, TXA 1000 mg given and hip surgery planned for today. Pharmacy asked to restart warfarin on 2/16.  Goal of Therapy:  INR 2-3 Monitor platelets by anticoagulation protocol: Yes   Plan:  Continue to hold warfarin today F/u postop course Daily PT/INR, CBC, bleeding  Tama Headings, PharmD, BCPS PGY2 Cardiology Pharmacy Resident Phone: [phone] 09/20/2020  10:29 AM  Please check [email or web address] for unit-specific pharmacy phone numbers.

## 2020-09-20 NOTE — ED Provider Notes (Signed)
[facility] EMERGENCY DEPARTMENT Provider Note   CSN: [id number] Arrival date & time: 09/20/20  0747     History Chief Complaint  Patient presents with  . Fall    Not on blood thinners    Ruben [name] is a 87 y.o. male.  Pt presents to the ED today with left hip pain.  The pt said he was headed to the hospital this morning as he had an appointment for transcatheter aortic valve replacement.  He is normally on coumadin, but has been off it for a week due to the surgery.  The pt said he fell when getting into the car and landed on his left hip.  He did hit his head, but denies loc.  Pt has an obvious deformity of his left hip.  EMS gave pt 200 mcg of fentanyl IV en route.  Pt denies any other pain.        Past Medical History:  Diagnosis Date  . Arthritis   . CHF (congestive heart failure) (HCC)   . COPD (chronic obstructive pulmonary disease) (HCC)   . Mitral regurgitation   . Persistent atrial fibrillation (HCC)    on coumadin   . RBBB   . Severe aortic stenosis     Patient Active Problem List   Diagnosis Date Noted  . Mitral regurgitation   . COPD (chronic obstructive pulmonary disease) (HCC)   . Acute on chronic congestive heart failure (HCC)   . Severe aortic stenosis   . Acute on chronic diastolic CHF (congestive heart failure) (HCC) 07/15/2020  . COPD with acute exacerbation (HCC) 07/15/2020  . Atrial fibrillation, chronic (HCC) 07/15/2020    Past Surgical History:  Procedure Laterality Date  . APPENDECTOMY    . BUBBLE STUDY  09/07/2020   Procedure: BUBBLE STUDY;  Surgeon: Sande Rives, MD;  Location: [facility] ENDOSCOPY;  Service: Cardiovascular;;  . CARDIAC CATHETERIZATION    . CLAVICLE SURGERY    . RIGHT/LEFT HEART CATH AND CORONARY ANGIOGRAPHY N/A 08/24/2020   Procedure: RIGHT/LEFT HEART CATH AND CORONARY ANGIOGRAPHY;  Surgeon: Tonny Bollman, MD;  Location: [facility] INVASIVE CV LAB;  Service: Cardiovascular;  Laterality: N/A;  . TEE WITHOUT  CARDIOVERSION N/A 09/07/2020   Procedure: TRANSESOPHAGEAL ECHOCARDIOGRAM (TEE);  Surgeon: Sande Rives, MD;  Location: [facility] ENDOSCOPY;  Service: Cardiovascular;  Laterality: N/A;  . TOTAL KNEE ARTHROPLASTY Bilateral        No family history on file.  Social History   Tobacco Use  . Smoking status: Former Smoker    Packs/day: 1.00    Years: 30.00    Pack years: 30.00    Quit date: 1975    Years since quitting: 47.1  . Smokeless tobacco: Never Used  Vaping Use  . Vaping Use: Never used  Substance Use Topics  . Alcohol use: Not Currently  . Drug use: Never    Home Medications Prior to Admission medications   Medication Sig Start Date End Date Taking? Authorizing Provider  acetaminophen (TYLENOL) 500 MG tablet Take 1,000 mg by mouth every 6 (six) hours as needed for headache (pain).    [provider]  ALPRAZolam Prudy Feeler) 0.25 MG tablet Take 0.25 mg by mouth at bedtime as needed for anxiety or sleep.    [provider]  Camphor-Menthol-Methyl Sal (SALONPAS) 3.08-11-08 % PTCH Place 1 patch onto the skin daily as needed (muscle aches/pain.).    [provider]  diclofenac Sodium (VOLTAREN) 1 % GEL Apply 1 application topically 4 (four) times  daily as needed (PAIN.).    [provider]  docusate sodium (COLACE) 100 MG capsule Take 1 capsule (100 mg total) by mouth 2 (two) times daily. Patient taking differently: Take 100 mg by mouth 2 (two) times daily as needed (constipation.). 07/19/20   Osvaldo Shipper, MD  dutasteride (AVODART) 0.5 MG capsule Take 0.5 mg by mouth daily. 05/21/20   [provider]  Eyelid Cleansers (EYESCRUB) PADS Place 1 each into both eyes daily as needed (dry eye).    [provider]  Glycerin-Polysorbate 80 (REFRESH DRY EYE THERAPY OP) Place 1 drop into both eyes daily as needed (Dry eyes).    [provider]  guaiFENesin (MUCINEX) 600 MG 12 hr tablet Take 600 mg by mouth daily as needed for to  loosen phlegm.    [provider]  HYDROcodone-acetaminophen (NORCO) 7.5-325 MG tablet Take 1 tablet by mouth 3 (three) times daily as needed (pain). 03/28/20   [provider]  levalbuterol Pauline Aus HFA) 45 MCG/ACT inhaler Inhale 2 puffs into the lungs every 4 (four) hours as needed for wheezing or shortness of breath.    [provider]  levalbuterol Pauline Aus) 0.63 MG/3ML nebulizer solution Take 0.63 mg by nebulization 2 (two) times daily.    [provider]  METAMUCIL FIBER PO Take 1 capsule by mouth daily as needed (Constipation).    [provider]  methocarbamol (ROBAXIN) 500 MG tablet Take 500 mg by mouth every 6 (six) hours as needed for muscle spasms.    [provider]  metoprolol succinate (TOPROL-XL) 50 MG 24 hr tablet Take 50 mg by mouth daily. 04/23/20   [provider]  montelukast (SINGULAIR) 10 MG tablet Take 10 mg by mouth daily. 04/19/20   [provider]  polyethylene glycol (MIRALAX / GLYCOLAX) 17 g packet Take 17 g by mouth daily as needed for mild constipation. Patient taking differently: Take 17 g by mouth daily as needed (constipation.). 07/19/20   Osvaldo Shipper, MD  potassium chloride (KLOR-CON) 8 MEQ tablet Take 8 mEq by mouth daily. 06/25/20   [provider]  tamsulosin (FLOMAX) 0.4 MG CAPS capsule Take 0.8 mg by mouth at bedtime. 05/21/20   [provider]  tizanidine (ZANAFLEX) 2 MG capsule Take 2 mg by mouth at bedtime as needed for muscle spasms. 03/23/20   [provider]  torsemide (DEMADEX) 20 MG tablet Take 1 tablet (20 mg total) by mouth daily. 07/20/20   Osvaldo Shipper, MD  warfarin (COUMADIN) 6 MG tablet Take 6 mg by mouth at bedtime. 06/23/20   [provider]    Allergies    Patient has no known allergies.  Review of Systems   Review of Systems  Musculoskeletal:       Left hip pain  All other systems reviewed and are negative.   Physical  Exam Updated Vital Signs BP 120/75   Pulse 96   Temp 97.9 F (36.6 C) (Oral)   Resp (!) 21   SpO2 98%   Physical Exam Vitals and nursing note reviewed.  Constitutional:      Appearance: Normal appearance.  HENT:     Head: Normocephalic and atraumatic.     Right Ear: External ear normal.     Left Ear: External ear normal.     Nose: Nose normal.     Mouth/Throat:     Mouth: Mucous membranes are dry.  Eyes:     Extraocular Movements: Extraocular movements intact.     Conjunctiva/sclera: Conjunctivae  normal.     Pupils: Pupils are equal, round, and reactive to light.  Cardiovascular:     Rate and Rhythm: Tachycardia present. Rhythm irregular.     Pulses: Normal pulses.     Heart sounds: Murmur heard.    Pulmonary:     Effort: Pulmonary effort is normal.     Breath sounds: Normal breath sounds.  Abdominal:     General: Abdomen is flat. Bowel sounds are normal.     Palpations: Abdomen is soft.  Musculoskeletal:     Cervical back: Normal range of motion and neck supple.     Left hip: Deformity and bony tenderness present. Decreased range of motion.  Skin:    General: Skin is warm.     Capillary Refill: Capillary refill takes less than 2 seconds.  Neurological:     General: No focal deficit present.     Mental Status: He is alert and oriented to person, place, and time.  Psychiatric:        Mood and Affect: Mood normal.        Behavior: Behavior normal.        Thought Content: Thought content normal.        Judgment: Judgment normal.     ED Results / Procedures / Treatments   Labs (all labs ordered are listed, but only abnormal results are displayed) Labs Reviewed  CBC WITH DIFFERENTIAL/PLATELET - Abnormal; Notable for the following components:      Result Value   RBC 3.96 (*)    Hemoglobin 12.6 (*)    MCV 103.3 (*)    All other components within normal limits  PROTIME-INR  COMPREHENSIVE METABOLIC PANEL  URINALYSIS, ROUTINE W REFLEX MICROSCOPIC  TYPE AND  SCREEN    EKG EKG Interpretation  Date/Time:  Tuesday September 20 2020 08:14:38 EST Ventricular Rate:  109 PR Interval:    QRS Duration: 141 QT Interval:  347 QTC Calculation: 443 R Axis:   103 Text Interpretation: Atrial fibrillation Right bundle branch block Baseline wander in lead(s) V5 No significant change since last tracing Confirmed by Jacalyn LefevreHaviland, Aristide Waggle [phone]) on 09/20/2020 8:32:44 AM   Radiology DG Pelvis 1-2 Views  Result Date: 09/20/2020 CLINICAL DATA:  Fall this morning.  Left hip pain EXAM: PELVIS - 1-2 VIEW COMPARISON:  None. FINDINGS: Acute intertrochanteric left femur fracture with medial impaction. No evidence of pelvic ring fracture or diastasis. Osteopenia and atherosclerosis. IMPRESSION: Acute intertrochanteric left femur fracture. Electronically Signed   By: Marnee SpringJonathon  Watts M.D.   On: 09/20/2020 09:05   DG FEMUR MIN 2 VIEWS LEFT  Result Date: 09/20/2020 CLINICAL DATA:  Fall this morning.  Left hip pain EXAM: LEFT FEMUR 2 VIEWS COMPARISON:  None. FINDINGS: Acute intertrochanteric left femur fracture with medial impaction. Knee arthroplasty without acute superimposed finding. Generalized osteopenia and atherosclerosis. IMPRESSION: Intertrochanteric left femur fracture with medial impaction. Electronically Signed   By: Marnee SpringJonathon  Watts M.D.   On: 09/20/2020 09:04    Procedures Procedures   Medications Ordered in ED Medications  0.9 %  sodium chloride infusion ( Intravenous New Bag/Given 09/20/20 0851)  fentaNYL (SUBLIMAZE) injection 50 mcg (has no administration in time range)  morphine 4 MG/ML injection 4 mg (has no administration in time range)  tranexamic acid (CYKLOKAPRON) IVPB 1,000 mg (has no administration in time range)  ondansetron (ZOFRAN) injection 4 mg (4 mg Intravenous Given 09/20/20 0851)    ED Course  I have reviewed the triage vital signs and the nursing notes.  Pertinent labs &  imaging results that were available during my care of the patient were  reviewed by me and considered in my medical decision making (see chart for details).    MDM Rules/Calculators/A&P                          Pt had a pre-op CXR on 2/11 which showed nothing acute.  Pt denies any chest pain or rib tenderness, so we did not do one today.    Pt did have a pre-op Covid swab done on 2/11 which was negative.  Pt d/w Dr. Excell Seltzer (cards) who was planning on doing the surgery today.  He spoke with Dr. Eulah Pont (ortho) who plans to operate today.  He feels like pt is cleared for hip surgery.  They will hold off the TAVR until pt recovers from his hip surgery.  PT d/w Dr. Katrinka Blazing (triad) for admission.  Final Clinical Impression(s) / ED Diagnoses Final diagnoses:  Fall, initial encounter  Closed nondisplaced intertrochanteric fracture of left femur, initial encounter (HCC)  Longstanding persistent atrial fibrillation [facility])    Rx / DC Orders ED Discharge Orders    None       Jacalyn Lefevre, MD 09/20/20 0930

## 2020-09-20 NOTE — ED Notes (Signed)
hospitalist at bedside

## 2020-09-20 NOTE — ED Notes (Signed)
Pt transported to xray 

## 2020-09-20 NOTE — Progress Notes (Signed)
Initial Nutrition Assessment  DOCUMENTATION CODES:   Obesity unspecified  INTERVENTION:   Once diet is advanced, add:   -Ensure Enlive po BID, each supplement provides 350 kcal and 20 grams of protein -MVI with minerals daily  NUTRITION DIAGNOSIS:   Increased nutrient needs related to post-op healing as evidenced by estimated needs.  GOAL:   Patient will meet greater than or equal to 90% of their needs  MONITOR:   PO intake,Supplement acceptance,Diet advancement,Labs,Weight trends,I & O's  REASON FOR ASSESSMENT:   Consult Assessment of nutrition requirement/status,Hip fracture protocol  ASSESSMENT:   Ruben [name] is a 87 y.o. male with medical history significant of severe aortic stenosis, diastolic CHF, persistent atrial fibrillation on anticoagulation, COPD, chronic respiratory failure on 2 L of oxygen and arthritis presents after having a fall this morning.  Pt admitted with lt femur fracture s/p fall.   Pt unavailable at time of visit.   Per orthopedics notes, plan for IMN today. Pt currently NPO for procedure.   Reviewed wt hx; wt has been stable over the past month.   Pt with increased nutritional needs for post-op healing and would benefit form addition of oral nutrition supplements.   Medications reviewed and include demadex.   Labs reviewed.   Diet Order:   Diet Order            Diet NPO time specified  Diet effective now                 EDUCATION NEEDS:   No education needs have been identified at this time  Skin:  Skin Assessment: Reviewed RN Assessment  Last BM:  unknown  Height:   Ht Readings from Last 1 Encounters:  09/16/20 5\' 6"  (1.676 m)    Weight:   Wt Readings from Last 1 Encounters:  09/16/20 87.5 kg    Ideal Body Weight:  64.5 kg  BMI:  There is no height or weight on file to calculate BMI.  Estimated Nutritional Needs:   Kcal:  1700-1900  Protein:  85-100 grams  Fluid:  > 1.7 L    11/14/20, RD, LDN,  CDCES Registered Dietitian II Certified Diabetes Care and Education Specialist Please refer to [facility] for RD and/or RD on-call/weekend/after hours pager

## 2020-09-20 NOTE — Plan of Care (Signed)

## 2020-09-20 NOTE — Anesthesia Procedure Notes (Signed)
Arterial Line Insertion Start/End2/15/2022 4:45 PM, 09/20/2020 4:48 PM Performed by: Zollie Scale, CRNA, CRNA  Patient location: OOR procedure area. Preanesthetic checklist: patient identified, IV checked, site marked, risks and benefits discussed, surgical consent, monitors and equipment checked, pre-op evaluation, timeout performed and anesthesia consent Left, radial was placed Catheter size: 20 G Hand hygiene performed , maximum sterile barriers used  and Seldinger technique used Allen's test indicative of satisfactory collateral circulation Attempts: 1 Procedure performed without using ultrasound guided technique. Following insertion, dressing applied and Biopatch. Post procedure assessment: normal  Patient tolerated the procedure well with no immediate complications.

## 2020-09-20 NOTE — Transfer of Care (Signed)
Immediate Anesthesia Transfer of Care Note  Patient: Turrell Severt  Procedure(s) Performed: INTRAMEDULLARY (IM) NAIL INTERTROCHANTRIC (Left )  Patient Location: PACU  Anesthesia Type:General  Level of Consciousness: awake, alert  and oriented  Airway & Oxygen Therapy: Patient Spontanous Breathing and Patient connected to nasal cannula oxygen  Post-op Assessment: Report given to RN and Post -op Vital signs reviewed and stable  Post vital signs: Reviewed and stable  Last Vitals:  Vitals Value Taken Time  BP 117/83 (ABP) 09/20/20 1817  Temp    Pulse 105 09/20/20 1821  Resp 14 09/20/20 1821  SpO2 94 % 09/20/20 1821  Vitals shown include unvalidated device data.  Last Pain:  Vitals:   09/20/20 1605  TempSrc:   PainSc: Asleep         Complications: No complications documented.

## 2020-09-20 NOTE — Interval H&P Note (Signed)
Anesthesia H&P Update: History and Physical Exam reviewed; patient is OK for planned anesthetic and procedure. I participated in the care of this patient and agree with the above history, physical and evaluation. I performed a review of the history and a physical exam as detailed   Sharrod Achille Daniel Hetvi Shawhan MD  

## 2020-09-20 NOTE — H&P (View-Only) (Signed)
Reason for Consult:Left hip fx Referring Physician: Arlyss Queen Time called: [id number] Time at bedside: 0940    Ruben [name] is an 87 y.o. male.  HPI: Ruben [name] was going out to get in the car to come have a TAVR when he missed a step and fell. He had immediate left hip pain and could not get up. He was brought to the ED where x-ray showed a left hip fx and orthopedic surgery was consulted. He c/o localized pain to the area. He lives with his son and daughter-in-law and does not generally use any assistive devices to ambulate.  Past Medical History:  Diagnosis Date  . Arthritis   . CHF (congestive heart failure) (HCC)   . COPD (chronic obstructive pulmonary disease) (HCC)   . Mitral regurgitation   . Persistent atrial fibrillation (HCC)    on coumadin   . RBBB   . Severe aortic stenosis     Past Surgical History:  Procedure Laterality Date  . APPENDECTOMY    . BUBBLE STUDY  09/07/2020   Procedure: BUBBLE STUDY;  Surgeon: Sande Rives, MD;  Location: [facility] ENDOSCOPY;  Service: Cardiovascular;;  . CARDIAC CATHETERIZATION    . CLAVICLE SURGERY    . RIGHT/LEFT HEART CATH AND CORONARY ANGIOGRAPHY N/A 08/24/2020   Procedure: RIGHT/LEFT HEART CATH AND CORONARY ANGIOGRAPHY;  Surgeon: Tonny Bollman, MD;  Location: [facility] INVASIVE CV LAB;  Service: Cardiovascular;  Laterality: N/A;  . TEE WITHOUT CARDIOVERSION N/A 09/07/2020   Procedure: TRANSESOPHAGEAL ECHOCARDIOGRAM (TEE);  Surgeon: Sande Rives, MD;  Location: [facility] ENDOSCOPY;  Service: Cardiovascular;  Laterality: N/A;  . TOTAL KNEE ARTHROPLASTY Bilateral     No family history on file.  Social History:  reports that he quit smoking about 47 years ago. He has a 30.00 pack-year smoking history. He has never used smokeless tobacco. He reports previous alcohol use. He reports that he does not use drugs.  Allergies: No Known Allergies  Medications: I have reviewed the patient's current medications.  Results for orders placed or performed during  the hospital encounter of 09/20/20 (from the past 48 hour(s))  CBC WITH DIFFERENTIAL     Status: Abnormal   Collection Time: 09/20/20  8:03 AM  Result Value Ref Range   WBC 7.7 4.0 - 10.5 K/uL   RBC 3.96 (L) 4.22 - 5.81 MIL/uL   Hemoglobin 12.6 (L) 13.0 - 17.0 g/dL   HCT 29.4 76.5 - 46.5 %   MCV 103.3 (H) 80.0 - 100.0 fL   MCH 31.8 26.0 - 34.0 pg   MCHC 30.8 30.0 - 36.0 g/dL   RDW 03.5 46.5 - 68.1 %   Platelets 179 150 - 400 K/uL   nRBC 0.0 0.0 - 0.2 %   Neutrophils Relative % 81 %   Neutro Abs 6.2 1.7 - 7.7 K/uL   Lymphocytes Relative 10 %   Lymphs Abs 0.8 0.7 - 4.0 K/uL   Monocytes Relative 7 %   Monocytes Absolute 0.5 0.1 - 1.0 K/uL   Eosinophils Relative 1 %   Eosinophils Absolute 0.1 0.0 - 0.5 K/uL   Basophils Relative 1 %   Basophils Absolute 0.1 0.0 - 0.1 K/uL   Immature Granulocytes 0 %   Abs Immature Granulocytes 0.02 0.00 - 0.07 K/uL    Comment: Performed at [facility], [address] [place], [place] [address]  Protime-INR     Status: None   Collection Time: 09/20/20  8:03 AM  Result Value Ref Range   Prothrombin Time  13.6 11.4 - 15.2 seconds   INR 1.1 0.8 - 1.2    Comment: (NOTE) INR goal varies based on device and disease states. Performed at [facility], [address] [place], [place] [address]   Comprehensive metabolic panel     Status: Abnormal   Collection Time: 09/20/20  8:03 AM  Result Value Ref Range   Sodium 141 135 - 145 mmol/L   Potassium 4.5 3.5 - 5.1 mmol/L   Chloride 105 98 - 111 mmol/L   CO2 27 22 - 32 mmol/L   Glucose, Bld 132 (H) 70 - 99 mg/dL    Comment: Glucose reference range applies only to samples taken after fasting for at least 8 hours.   BUN 28 (H) 8 - 23 mg/dL   Creatinine, Ser 4.14 0.61 - 1.24 mg/dL   Calcium 9.0 8.9 - 23.9 mg/dL   Total Protein 6.7 6.5 - 8.1 g/dL   Albumin 3.5 3.5 - 5.0 g/dL   AST 16 15 - 41 U/L   ALT 12 0 - 44 U/L   Alkaline Phosphatase 71 38 - 126 U/L   Total Bilirubin 1.4 (H) 0.3 - 1.2  mg/dL   GFR, Estimated >53 >20 mL/min    Comment: (NOTE) Calculated using the CKD-EPI Creatinine Equation (2021)    Anion gap 9 5 - 15    Comment: Performed at [facility], [address], [place], [place] [address]  Type and screen [facility]     Status: None   Collection Time: 09/20/20  8:21 AM  Result Value Ref Range   ABO/RH(D) O POS    Antibody Screen NEG    Sample Expiration      09/23/2020,2359 Performed at [facility], [address], [place], [place] [address]     DG Pelvis 1-2 Views  Result Date: 09/20/2020 CLINICAL DATA:  Fall this morning.  Left hip pain EXAM: PELVIS - 1-2 VIEW COMPARISON:  None. FINDINGS: Acute intertrochanteric left femur fracture with medial impaction. No evidence of pelvic ring fracture or diastasis. Osteopenia and atherosclerosis. IMPRESSION: Acute intertrochanteric left femur fracture. Electronically Signed   By: Marnee Spring M.D.   On: 09/20/2020 09:05   DG FEMUR MIN 2 VIEWS LEFT  Result Date: 09/20/2020 CLINICAL DATA:  Fall this morning.  Left hip pain EXAM: LEFT FEMUR 2 VIEWS COMPARISON:  None. FINDINGS: Acute intertrochanteric left femur fracture with medial impaction. Knee arthroplasty without acute superimposed finding. Generalized osteopenia and atherosclerosis. IMPRESSION: Intertrochanteric left femur fracture with medial impaction. Electronically Signed   By: Marnee Spring M.D.   On: 09/20/2020 09:04    Review of Systems  HENT: Negative for ear discharge, ear pain, hearing loss and tinnitus.   Eyes: Negative for photophobia and pain.  Respiratory: Negative for cough and shortness of breath.   Cardiovascular: Negative for chest pain.  Gastrointestinal: Negative for abdominal pain, nausea and vomiting.  Genitourinary: Negative for dysuria, flank pain, frequency and urgency.  Musculoskeletal: Positive for arthralgias (Left hip). Negative for back pain, myalgias and neck pain.  Neurological: Negative for  dizziness and headaches.  Hematological: Does not bruise/bleed easily.  Psychiatric/Behavioral: The patient is not nervous/anxious.    Blood pressure 120/75, pulse 96, temperature 97.9 F (36.6 C), temperature source Oral, resp. rate (!) 21, SpO2 98 %. Physical Exam Constitutional:      General: He is not in acute distress.    Appearance: He is well-developed and well-nourished. He is not diaphoretic.  HENT:     Head: Normocephalic and atraumatic.  Eyes:     General: No scleral icterus.       Right eye: No discharge.        Left eye: No discharge.     Conjunctiva/sclera: Conjunctivae normal.  Cardiovascular:     Rate and Rhythm: Normal rate and regular rhythm.  Pulmonary:     Effort: Pulmonary effort is normal. No respiratory distress.  Musculoskeletal:     Cervical back: Normal range of motion.     Comments: LLE No traumatic wounds, ecchymosis, or rash  Mod TTP hip  No knee or ankle effusion  Knee stable to varus/ valgus and anterior/posterior stress  Sens DPN, SPN, TN intact  Motor EHL, ext, flex, evers 5/5  DP 1+, PT 0, No significant edema  Skin:    General: Skin is warm and dry.  Neurological:     Mental Status: He is alert.  Psychiatric:        Mood and Affect: Mood and affect normal.        Behavior: Behavior normal.     Assessment/Plan: Left hip fx -- Plan IMN today with Dr. Eulah Pont. Please keep NPO. Multiple medical problems including chronic diastolic CHF, RBBB, mitral regurgitation, HTN, COPD on home 02, longstanding persistent atrial fibrillation on warfarin, degenerative arthritis and aortic stenosis -- per primary service and cardiology    Freeman Caldron, PA-C Orthopedic Surgery [phone] 09/20/2020, 9:54 AM

## 2020-09-20 NOTE — Progress Notes (Signed)
TC from patient's son reporting patient fall when getting into car. They were in process of calling ambulance for transport for suspected hip fracture. Dr Excell Seltzer, Cath lab, charge CRNA and OR desk notified.

## 2020-09-20 NOTE — Consult Note (Addendum)
Cardiology Consultation:   Patient ID: Ruben [name] MRN: [id number]; DOB: 21-Nov-1926  Admit date: 09/20/2020 Date of Consult: 09/20/2020  PCP:  Alroy Dust, L.Marlou Sa, [place]  Cardiologist:  [name] Advanced Practice Provider:  No care team member to display Electrophysiologist:  None  [facility]ural [facility] Clinic:  Dr. Burt Knack    {   Patient Profile:   Ruben [name] is a 87 y.o. male with a hx of chronic diastolic CHF, RBBB, mitral regurgitation, HTN, COPD on home 02, longstanding persistent atrial fibrillation on warfarin, degenerative arthritis and aortic stenosis who is being seen today for Ruben evaluation of severe aortic stenosis and hip fracture at Ruben request of Dr. Gilford Raid.  History of Present Illness:   Ruben [name] has recently undergone work up for severe, symptomatic aortic stenosis. He was scheduled for TAVR today and had a mechanical fall while trying to get into Ruben car to come to Ruben hospital for surgery. Ruben patient denies loss of consciousness and is clear that is was a mechanical fall. He was unable to get up and EMS was called. In ER triage he was noted to have deformity to left hip and inward rotation and shortening to left leg. Hip X-ray c/w acute intertrochanteric left femur fracture. Ruben patient's son ( a [employer] executive) is present. Dr. Edmonia Lynch has been consulted and planning to repair hip this afternoon.   Ruben patient specifically denies worsening of his chronic dyspnea. 02 sats have been stable at home. No chest pain or s/s CHF. No dizziness or syncope.    Past Medical History:  Diagnosis Date  . Arthritis   . CHF (congestive heart failure) ([facility])   . COPD (chronic obstructive pulmonary disease) ([place])   . Mitral regurgitation   . Persistent atrial fibrillation (HCC)    on coumadin   . RBBB   . Severe aortic stenosis     Past Surgical History:  Procedure Laterality Date  . APPENDECTOMY    . BUBBLE STUDY  09/07/2020   Procedure:  BUBBLE STUDY;  Surgeon: Geralynn Rile, MD;  Location: [facility];  Service: Cardiovascular;;  . CARDIAC CATHETERIZATION    . CLAVICLE SURGERY    . RIGHT/LEFT HEART CATH AND CORONARY ANGIOGRAPHY N/A 08/24/2020   Procedure: RIGHT/LEFT HEART CATH AND CORONARY ANGIOGRAPHY;  Surgeon: Sherren Mocha, MD;  Location: [facility];  Service: Cardiovascular;  Laterality: N/A;  . TEE WITHOUT CARDIOVERSION N/A 09/07/2020   Procedure: TRANSESOPHAGEAL ECHOCARDIOGRAM (TEE);  Surgeon: Geralynn Rile, MD;  Location: [facility];  Service: Cardiovascular;  Laterality: N/A;  . TOTAL KNEE ARTHROPLASTY Bilateral      Home Medications:  Prior to Admission medications   Medication Sig Start Date End Date Taking? Authorizing Provider  acetaminophen (TYLENOL) 500 MG tablet Take 1,000 mg by mouth every 6 (six) hours as needed for headache (pain).    [provider]  ALPRAZolam Duanne Moron) 0.25 MG tablet Take 0.25 mg by mouth at bedtime as needed for anxiety or sleep.    [provider]  Camphor-Menthol-Methyl Sal (SALONPAS) 3.08-11-08 % PTCH Place 1 patch onto Ruben skin daily as needed (muscle aches/pain.).    [provider]  diclofenac Sodium (VOLTAREN) 1 % GEL Apply 1 application topically 4 (four) times daily as needed (PAIN.).    [provider]  docusate sodium (COLACE) 100 MG capsule Take 1 capsule (100 mg total) by mouth 2 (two) times daily. Patient taking differently: Take 100 mg by mouth 2 (two)  times daily as needed (constipation.). 07/19/20   Bonnielee Haff, MD  dutasteride (AVODART) 0.5 MG capsule Take 0.5 mg by mouth daily. 05/21/20   [provider]  Eyelid Cleansers (EYESCRUB) PADS Place 1 each into both eyes daily as needed (dry eye).    [provider]  Glycerin-Polysorbate 80 (REFRESH DRY EYE THERAPY OP) Place 1 drop into both eyes daily as needed (Dry eyes).    [provider]  guaiFENesin (MUCINEX) 600 MG 12 hr tablet  Take 600 mg by mouth daily as needed for to loosen phlegm.    [provider]  HYDROcodone-acetaminophen (NORCO) 7.5-325 MG tablet Take 1 tablet by mouth 3 (three) times daily as needed (pain). 03/28/20   [provider]  levalbuterol Penne Lash HFA) 45 MCG/ACT inhaler Inhale 2 puffs into Ruben lungs every 4 (four) hours as needed for wheezing or shortness of breath.    [provider]  levalbuterol Penne Lash) 0.63 MG/3ML nebulizer solution Take 0.63 mg by nebulization 2 (two) times daily.    [provider]  METAMUCIL FIBER PO Take 1 capsule by mouth daily as needed (Constipation).    [provider]  methocarbamol (ROBAXIN) 500 MG tablet Take 500 mg by mouth every 6 (six) hours as needed for muscle spasms.    [provider]  metoprolol succinate (TOPROL-XL) 50 MG 24 hr tablet Take 50 mg by mouth daily. 04/23/20   [provider]  montelukast (SINGULAIR) 10 MG tablet Take 10 mg by mouth daily. 04/19/20   [provider]  polyethylene glycol (MIRALAX / GLYCOLAX) 17 g packet Take 17 g by mouth daily as needed for mild constipation. Patient taking differently: Take 17 g by mouth daily as needed (constipation.). 07/19/20   Bonnielee Haff, MD  potassium chloride (KLOR-CON) 8 MEQ tablet Take 8 mEq by mouth daily. 06/25/20   [provider]  tamsulosin (FLOMAX) 0.4 MG CAPS capsule Take 0.8 mg by mouth at bedtime. 05/21/20   [provider]  tizanidine (ZANAFLEX) 2 MG capsule Take 2 mg by mouth at bedtime as needed for muscle spasms. 03/23/20   [provider]  torsemide (DEMADEX) 20 MG tablet Take 1 tablet (20 mg total) by mouth daily. 07/20/20   Bonnielee Haff, MD  warfarin (COUMADIN) 6 MG tablet Take 6 mg by mouth at bedtime. 06/23/20   [provider]    Inpatient Medications: Scheduled Meds: .  morphine injection  4 mg Intravenous Once   Continuous Infusions: . sodium chloride 125 mL/hr at  09/20/20 0851   PRN Meds: fentaNYL (SUBLIMAZE) injection  Allergies:   No Known Allergies  Social History:   Social History   Socioeconomic History  . Marital status: Married    Spouse name: Not on file  . Number of children: Not on file  . Years of education: Not on file  . Highest education level: Not on file  Occupational History  . Occupation: retired  Tobacco Use  . Smoking status: Former Smoker    Packs/day: 1.00    Years: 30.00    Pack years: 30.00    Quit date: 1975    Years since quitting: 47.1  . Smokeless tobacco: Never Used  Vaping Use  . Vaping Use: Never used  Substance and Sexual Activity  . Alcohol use: Not Currently  . Drug use: Never  . Sexual activity: Not on file  Other Topics Concern  . Not on file  Social History Narrative  . Not on file   Social  Determinants of Health   Financial Resource Strain: Not on file  Food Insecurity: No Food Insecurity  . Worried About Charity fundraiser in Rub[place] Last Year: Never true  . Ran Out of Food in Rub[place] Last Year: Never true  Transportation Needs: No Transportation Needs  . Lack of Transportation (Medical): No  . Lack of Transportation (Non-Medical): No  Physical Activity: Not on file  Stress: Not on file  Social Connections: Not on file  Intimate Partner Violence: Not on file    Family History:   No family history on file.   ROS:  Please see Ruben history of present illness.  All other ROS reviewed and negative.     Physical Exam/Data:   Vitals:   09/20/20 0803 09/20/20 0821  BP: (!) 150/91   Pulse: (!) 103   Resp: (!) 25   Temp: 97.9 F (36.6 C)   TempSrc: Oral   SpO2: 93% 94%   No intake or output data in Ruben 24 hours ending 09/20/20 0901 Last 3 Weights 09/16/2020 09/13/2020 09/07/2020  Weight (lbs) 192 lb 12.8 oz 188 lb 186 lb  Weight (kg) 87.454 kg 85.276 kg 84.369 kg     There is no height or weight on file to calculate BMI.  General:  Well nourished, well developed, in no acute  distress HEENT: normal Lymph: no adenopathy Neck: no JVD Endocrine:  No thryomegaly Cardiac:  normal S1, S2; irreg irreg, 3/6 SEM  Lungs:  clear to auscultation bilaterally, no wheezing, rhonchi or rales  Abd: soft, nontender, no hepatomegaly  Ext: no edema Musculoskeletal:  No deformities, BUE and BLE strength normal and equal Skin: warm and dry  Neuro:  CNs 2-12 intact, no focal abnormalities noted Psych:  Normal affect   EKG:  Ruben EKG was personally reviewed and demonstrates:  afib with RBBB Telemetry:  Telemetry was personally reviewed and demonstrates:  afib  Relevant CV Studies: Echo 07/16/20 IMPRESSIONS  1. Left ventricular ejection fraction, by estimation, is 55 to 60%. Ruben  left ventricle has normal function. Ruben left ventricle has no regional  wall motion abnormalities. There is moderate left ventricular hypertrophy.  Left ventricular diastolic  parameters are indeterminate.  2. Right ventricular systolic function is moderately reduced. Ruben right  ventricular size is mildly enlarged. There is mildly elevated pulmonary  artery systolic pressure.  3. Left atrial size was moderately dilated.  4. Mild functional mitral stenosis due to severe MAC. Ruben mitral valve is  degenerative. Mild mitral valve regurgitation. Mild mitral stenosis.  Severe mitral annular calcification.  5. Tricuspid valve regurgitation is moderate.  6. Ruben aortic valve is tricuspid. There is severe calcifcation of Ruben  aortic valve. There is severe thickening of Ruben aortic valve. Aortic valve  regurgitation is not visualized. Severe aortic valve stenosis.  7. Ruben inferior vena cava is normal in size with greater than 50%  respiratory variability, suggesting right atrial pressure of 3 mmHg.   Laboratory Data:  High Sensitivity Troponin:  No results for input(s): TROPONINIHS in Ruben last 720 hours.   Chemistry Recent Labs  Lab 09/16/20 1121  NA 138  K 3.8  CL 99  CO2 29  GLUCOSE 94  BUN  25*  CREATININE 1.00  CALCIUM 8.9  GFRNONAA >60  ANIONGAP 10    Recent Labs  Lab 09/16/20 1121  PROT 6.9  ALBUMIN 3.7  AST 17  ALT 12  ALKPHOS 74  BILITOT 1.3*   Hematology Recent Labs  Lab 09/16/20 1121 09/20/20  0803  WBC 7.0 7.7  RBC 3.94* 3.96*  HGB 13.0 12.6*  HCT 39.5 40.9  MCV 100.3* 103.3*  MCH 33.0 31.8  MCHC 32.9 30.8  RDW 15.0 14.9  PLT 198 179   BNPNo results for input(s): BNP, PROBNP in Ruben last 168 hours.  DDimer No results for input(s): DDIMER in Ruben last 168 hours.   Radiology/Studies:  DG Chest 2 View  Result Date: 09/16/2020 CLINICAL DATA:  Pre-admit, TAVR, shortness of breath EXAM: CHEST - 2 VIEW COMPARISON:  08/16/2020 FINDINGS: Cardiomegaly. Unchanged small bilateral pleural effusions. Disc degenerative disease of Ruben thoracic spine. IMPRESSION: Cardiomegaly and unchanged small bilateral pleural effusions. No new airspace opacity. Electronically Signed   By: Eddie Candle M.D.   On: 09/16/2020 16:35     Assessment and Plan:   Acute hip fracture after a mechanical fall: pt is cleared from a cardiac standpoint for hip surgery today. He has severe aortic stenosis which will increase his perioperative risk, but it is not prohibitive. Importantly, he has has stable cardiac symptoms with no recent worsening or s/s CHF. Also, he has been off his coumadin in anticipation of TAVR. Plan for orthopedic repair with TAVR several weeks down Ruben road based on clinical recovery.   Structural heart will follow along.   For questions or updates, please contact [facility] Please consult [email or web address] for contact info under    Signed, Angelena Form, PA-C  09/20/2020 9:01 AM  Patient seen, examined. Available data reviewed. Agree with findings, assessment, and plan as outlined by [name], PA-C.  Ruben patient is evaluated emergency department.  We received a call from Ruben short stay this morning, informing us that Ruben patient had a mechanical fall  requiring EMS to bring him to Ruben emergency room.  He was scheduled to undergo elective TAVR today.  Ruben patient's son is present at Ruben bedside.  Ruben patient reports no worsening in his cardiac symptoms since I saw him in outpatient consultation August 12, 2020.  In fact, he reports some improvement in his shortness of breath since that time.  He remains on home O2 but his oxygen saturations have been better and he is less dyspneic now.  Ruben patient has undergone extensive multidisciplinary heart team review of his case.  He has undergone all of Ruben pre-TAVR imaging studies including CTA studies, diagnostic right and left heart catheterization, and a transesophageal echo to evaluate severity of mitral regurgitation.  He was scheduled for TAVR this morning, but unfortunately has sustained an intertrochanteric left femur fracture with medial impaction.  On my exam, Ruben patient is alert and oriented, in no distress.  JVP is mildly increased.  Carotid upstrokes are normal.  Lung fields are clear.  Heart is irregularly irregular with a 3/6 systolic murmur heard at Ruben apex as well as Ruben right upper sternal border.  There is no diastolic murmur.  Ruben abdomen is soft and nontender.  There is trace pretibial edema.  Ruben left leg is externally rotated.  Skin is warm and dry without rash.  EKG shows atrial fibrillation with a heart rate of 105 bpm, right bundle branch block, no significant change from baseline.  In summary, Ruben patient has chronic diastolic heart failure with New York Heart Association functional class III symptoms in Ruben setting of advanced age, severe aortic stenosis, and longstanding persistent atrial fibrillation.  He will require left hip surgery to treat his intertrochanteric femur fracture resulting from a mechanical fall this morning.  I have discussed  his case with Dr. Percell Miller.  I recommend proceeding with hip surgery, understanding that his cardiac risk of complications is high.  I do not think  there is any safe way to deal with his aortic stenosis either prior to or concomitant with hip surgery.  Ruben cardiology team will follow him postoperatively.  Fortunately, his cardiac symptoms are stable and there is no sign of any acute exacerbation of his chronic cardiovascular problems.  We will tentatively plan to proceed with TAVR once he recovers from hip surgery.  His case is discussed with Dr. Percell Miller, personally discussed with Ruben patient, as well as Ruben patient's son.  Sherren Mocha, M.D. 09/20/2020 9:28 AM

## 2020-09-20 NOTE — Anesthesia Procedure Notes (Signed)
Procedure Name: Intubation Date/Time: 09/20/2020 5:00 PM Performed by: Zollie Scale, CRNA Pre-anesthesia Checklist: Patient identified, Emergency Drugs available, Suction available and Patient being monitored Patient Re-evaluated:Patient Re-evaluated prior to induction Oxygen Delivery Method: Circle System Utilized Preoxygenation: Pre-oxygenation with 100% oxygen Induction Type: IV induction Ventilation: Two handed mask ventilation required Laryngoscope Size: Miller and 2 Grade View: Grade I Tube type: Oral Tube size: 7.5 mm Number of attempts: 1 Airway Equipment and Method: Stylet Placement Confirmation: ETT inserted through vocal cords under direct vision,  positive ETCO2 and breath sounds checked- equal and bilateral Secured at: 23 cm Tube secured with: Tape Dental Injury: Teeth and Oropharynx as per pre-operative assessment

## 2020-09-20 NOTE — Progress Notes (Signed)
ANTICOAGULATION CONSULT NOTE Pharmacy Consult for Warfarin Indication: atrial fibrillation  No Known Allergies  Patient Measurements:     Vital Signs: Temp: 97.7 F (36.5 C) (02/15 2035) Temp Source: Oral (02/15 2035) BP: 115/67 (02/15 2035) Pulse Rate: 93 (02/15 2035)  Labs: Recent Labs    09/20/20 0803  HGB 12.6*  HCT 40.9  PLT 179  LABPROT 13.6  INR 1.1  CREATININE 1.03    Estimated Creatinine Clearance: 48.4 mL/min (by C-G formula based on SCr of 1.03 mg/dL).   Medical History: Past Medical History:  Diagnosis Date  . Arthritis   . CHF (congestive heart failure) (H[facility])   . COPD (chronic obstructive pulmonary disease) ([facility])   . Mitral regurgitation   . Persistent atrial fibrillation ([facility]CC)    on coumadin   . RBBB   . Severe aortic stenosis     Assessment: 87 yo M with persistent atrial fibrillation on warfarin PTA. Was scheduled for TAVR on 2/15 but had a mechanical fall on his way to the hospital resulting in femur fracture requiring L hip surgery. Warfarin has been on hold in anticipation for TAVR, with last dose on 2/11. INR 1.1 on admit, TXA 1000 mg given and hip surgery planned for today. Pharmacy asked to restart warfarin on 2/16.  Goal of Therapy:  INR 2-3 Monitor platelets by anticoagulation protocol: Yes   Plan:  Warfarin 6 mg po daily to start 2/16  Daily PT/INR, CBC, bleeding  Thank you Okey Regal, PharmD 09/20/2020  9:05 PM  Please check [email or web address] for unit-specific pharmacy phone numbers.

## 2020-09-20 NOTE — Consult Note (Signed)
Reason for Consult:Left hip fx Referring Physician: R Smith Time called: 0742 Time at bedside: 0940    Ruben [name] is an 87 y.o. male.  HPI: [place] was going out to get in the car to come have a TAVR when he missed a step and fell. He had immediate left hip pain and could not get up. He was brought to the ED where x-ray showed a left hip fx and orthopedic surgery was consulted. He c/o localized pain to the area. He lives with his son and daughter-in-law and does not generally use any assistive devices to ambulate.  Past Medical History:  Diagnosis Date  . Arthritis   . CHF (congestive heart failure) (HCC)   . COPD (chronic obstructive pulmonary disease) ([facility])   . Mitral regurgitation   . Persistent atrial fibrillation ([facility])    on coumadin   . RBBB   . Severe aortic stenosis     Past Surgical History:  Procedure Laterality Date  . APPENDECTOMY    . BUBBLE STUDY  09/07/2020   Procedure: BUBBLE STUDY;  Surgeon: O'Neal, [place] Thomas, MD;  Location: [facility];  Service: Cardiovascular;;  . CARDIAC CATHETERIZATION    . CLAVICLE SURGERY    . RIGHT/LEFT HEART CATH AND CORONARY ANGIOGRAPHY N/A 08/24/2020   Procedure: RIGHT/LEFT HEART CATH AND CORONARY ANGIOGRAPHY;  Surgeon: Cooper, Amory Simonetti, MD;  Location: [facility];  Service: Cardiovascular;  Laterality: N/A;  . TEE WITHOUT CARDIOVERSION N/A 09/07/2020   Procedure: TRANSESOPHAGEAL ECHOCARDIOGRAM (TEE);  Surgeon: O'Neal, [place] Thomas, MD;  Location: [facility];  Service: Cardiovascular;  Laterality: N/A;  . TOTAL KNEE ARTHROPLASTY Bilateral     No family history on file.  Social History:  reports that he quit smoking about 47 years ago. He has a 30.00 pack-year smoking history. He has never used smokeless tobacco. He reports previous alcohol use. He reports that he does not use drugs.  Allergies: No Known Allergies  Medications: I have reviewed the patient's current medications.  Results for orders placed or performed during  the hospital encounter of 09/20/20 (from the past 48 hour(s))  CBC WITH DIFFERENTIAL     Status: Abnormal   Collection Time: 09/20/20  8:03 AM  Result Value Ref Range   WBC 7.7 4.0 - 10.5 K/uL   RBC 3.96 (L) 4.22 - 5.81 MIL/uL   Hemoglobin 12.6 (L) 13.0 - 17.0 g/dL   HCT 40.9 39.0 - 52.0 %   MCV 103.3 (H) 80.0 - 100.0 fL   MCH 31.8 26.0 - 34.0 pg   MCHC 30.8 30.0 - 36.0 g/dL   RDW 14.9 11.5 - 15.5 %   Platelets 179 150 - 400 K/uL   nRBC 0.0 0.0 - 0.2 %   Neutrophils Relative % 81 %   Neutro Abs 6.2 1.7 - 7.7 K/uL   Lymphocytes Relative 10 %   Lymphs Abs 0.8 0.7 - 4.0 K/uL   Monocytes Relative 7 %   Monocytes Absolute 0.5 0.1 - 1.0 K/uL   Eosinophils Relative 1 %   Eosinophils Absolute 0.1 0.0 - 0.5 K/uL   Basophils Relative 1 %   Basophils Absolute 0.1 0.0 - 0.1 K/uL   Immature Granulocytes 0 %   Abs Immature Granulocytes 0.02 0.00 - 0.07 K/uL    Comment: Performed at [facility], [address] [place], [place] [address]  Protime-INR     Status: None   Collection Time: 09/20/20  8:03 AM  Result Value Ref Range   Prothrombin Time   13.6 11.4 - 15.2 seconds   INR 1.1 0.8 - 1.2    Comment: (NOTE) INR goal varies based on device and disease states. Performed at [facility], [address] [place], [place] [address]   Comprehensive metabolic panel     Status: Abnormal   Collection Time: 09/20/20  8:03 AM  Result Value Ref Range   Sodium 141 135 - 145 mmol/L   Potassium 4.5 3.5 - 5.1 mmol/L   Chloride 105 98 - 111 mmol/L   CO2 27 22 - 32 mmol/L   Glucose, Bld 132 (H) 70 - 99 mg/dL    Comment: Glucose reference range applies only to samples taken after fasting for at least 8 hours.   BUN 28 (H) 8 - 23 mg/dL   Creatinine, Ser 4.14 0.61 - 1.24 mg/dL   Calcium 9.0 8.9 - 23.9 mg/dL   Total Protein 6.7 6.5 - 8.1 g/dL   Albumin 3.5 3.5 - 5.0 g/dL   AST 16 15 - 41 U/L   ALT 12 0 - 44 U/L   Alkaline Phosphatase 71 38 - 126 U/L   Total Bilirubin 1.4 (H) 0.3 - 1.2  mg/dL   GFR, Estimated >53 >20 mL/min    Comment: (NOTE) Calculated using the CKD-EPI Creatinine Equation (2021)    Anion gap 9 5 - 15    Comment: Performed at [facility], [address], [place], [place] [address]  Type and screen [facility]     Status: None   Collection Time: 09/20/20  8:21 AM  Result Value Ref Range   ABO/RH(D) O POS    Antibody Screen NEG    Sample Expiration      09/23/2020,2359 Performed at [facility], [address], [place], [place] [address]     DG Pelvis 1-2 Views  Result Date: 09/20/2020 CLINICAL DATA:  Fall this morning.  Left hip pain EXAM: PELVIS - 1-2 VIEW COMPARISON:  None. FINDINGS: Acute intertrochanteric left femur fracture with medial impaction. No evidence of pelvic ring fracture or diastasis. Osteopenia and atherosclerosis. IMPRESSION: Acute intertrochanteric left femur fracture. Electronically Signed   By: Marnee Spring M.D.   On: 09/20/2020 09:05   DG FEMUR MIN 2 VIEWS LEFT  Result Date: 09/20/2020 CLINICAL DATA:  Fall this morning.  Left hip pain EXAM: LEFT FEMUR 2 VIEWS COMPARISON:  None. FINDINGS: Acute intertrochanteric left femur fracture with medial impaction. Knee arthroplasty without acute superimposed finding. Generalized osteopenia and atherosclerosis. IMPRESSION: Intertrochanteric left femur fracture with medial impaction. Electronically Signed   By: Marnee Spring M.D.   On: 09/20/2020 09:04    Review of Systems  HENT: Negative for ear discharge, ear pain, hearing loss and tinnitus.   Eyes: Negative for photophobia and pain.  Respiratory: Negative for cough and shortness of breath.   Cardiovascular: Negative for chest pain.  Gastrointestinal: Negative for abdominal pain, nausea and vomiting.  Genitourinary: Negative for dysuria, flank pain, frequency and urgency.  Musculoskeletal: Positive for arthralgias (Left hip). Negative for back pain, myalgias and neck pain.  Neurological: Negative for  dizziness and headaches.  Hematological: Does not bruise/bleed easily.  Psychiatric/Behavioral: The patient is not nervous/anxious.    Blood pressure 120/75, pulse 96, temperature 97.9 F (36.6 C), temperature source Oral, resp. rate (!) 21, SpO2 98 %. Physical Exam Constitutional:      General: He is not in acute distress.    Appearance: He is well-developed and well-nourished. He is not diaphoretic.  HENT:     Head: Normocephalic and atraumatic.  Eyes:     General: No scleral icterus.       Right eye: No discharge.        Left eye: No discharge.     Conjunctiva/sclera: Conjunctivae normal.  Cardiovascular:     Rate and Rhythm: Normal rate and regular rhythm.  Pulmonary:     Effort: Pulmonary effort is normal. No respiratory distress.  Musculoskeletal:     Cervical back: Normal range of motion.     Comments: LLE No traumatic wounds, ecchymosis, or rash  Mod TTP hip  No knee or ankle effusion  Knee stable to varus/ valgus and anterior/posterior stress  Sens DPN, SPN, TN intact  Motor EHL, ext, flex, evers 5/5  DP 1+, PT 0, No significant edema  Skin:    General: Skin is warm and dry.  Neurological:     Mental Status: He is alert.  Psychiatric:        Mood and Affect: Mood and affect normal.        Behavior: Behavior normal.     Assessment/Plan: Left hip fx -- Plan IMN today with Dr. Eulah Pont. Please keep NPO. Multiple medical problems including chronic diastolic CHF, RBBB, mitral regurgitation, HTN, COPD on home 02, longstanding persistent atrial fibrillation on warfarin, degenerative arthritis and aortic stenosis -- per primary service and cardiology    Freeman Caldron, PA-C Orthopedic Surgery [phone] 09/20/2020, 9:54 AM

## 2020-09-20 NOTE — Anesthesia Preprocedure Evaluation (Addendum)
Anesthesia Evaluation  Patient identified by MRN, date of birth, ID band Patient awake    Reviewed: Allergy & Precautions, NPO status , Patient's Chart, lab work & pertinent test results, reviewed documented beta blocker date and time   Airway Mallampati: II  TM Distance: >3 FB Neck ROM: Full    Dental  (+) Dental Advisory Given, Partial Upper, Partial Lower, Missing   Pulmonary COPD,  COPD inhaler, former smoker,    Pulmonary exam normal breath sounds clear to auscultation       Cardiovascular hypertension, Pt. on home beta blockers +CHF  + dysrhythmias (RBBB) + Valvular Problems/Murmurs AS, AI and MR  Rhythm:Regular Rate:Normal + Systolic murmurs    Neuro/Psych negative neurological ROS  negative psych ROS   GI/Hepatic negative GI ROS, Neg liver ROS,   Endo/Other  Obesity   Renal/GU negative Renal ROS     Musculoskeletal  (+) Arthritis , Left hip fracture    Abdominal   Peds  Hematology  (+) Blood dyscrasia (Warfarin), anemia , Plt 179k   Anesthesia Other Findings Day of surgery medications reviewed with the patient.  Reproductive/Obstetrics                            Anesthesia Physical Anesthesia Plan  ASA: IV  Anesthesia Plan: General   Post-op Pain Management:    Induction: Intravenous  PONV Risk Score and Plan: 2 and Dexamethasone and Ondansetron  Airway Management Planned: Oral ETT  Additional Equipment: Arterial line  Intra-op Plan:   Post-operative Plan: Extubation in OR  Informed Consent: I have reviewed the patients History and Physical, chart, labs and discussed the procedure including the risks, benefits and alternatives for the proposed anesthesia with the patient or authorized representative who has indicated his/her understanding and acceptance.     Dental advisory given  Plan Discussed with: CRNA  Anesthesia Plan Comments:         Anesthesia  Quick Evaluation

## 2020-09-20 NOTE — H&P (Addendum)
History and Physical    Ruben [name] MNO:[id number] DOB: 1926/09/27 DOA: 09/20/2020  Referring MD/NP/PA: Jacalyn Lefevre, MD PCP: Clovis Riley, Elbert Ewings.Ruben Saucer, MD  Patient coming from: Home via EMS  Chief Complaint: Fall  I have personally briefly reviewed patient's old medical records in [facility]   HPI: Ruben [name] is a 87 y.o. male with medical history significant of severe aortic stenosis, diastolic CHF, persistent atrial fibrillation on anticoagulation, COPD, chronic respiratory failure on 2 L of oxygen and arthritis presents after having a fall this morning.  He was scheduled to have a transcatheter aortic valve replacement this morning.  Patient had been off of Coumadin for at least a week now in preparation for this surgery.  He had been coming down 2 steps at his house when he overstepped the second step, and lost his balance causing him to fall down on his left hip. He reported having significant pain in the left hip and any kind of movement worsen the pain.  Patient may have bumped his head.  Denies any palpitations, lightheadedness, loss of consciousness, nausea, vomiting, dysuria, or chest pain.  Otherwise patient reports that he been in his normal state of health.  He was not able to get up on his own and therefore EMS was called.  In route with EMS he received 200 mcg of fentanyl IV.  He also reports that he had not taken any of his other medications this morning.  Patient son notes that he had been started on oxygen of 2 L following his hospitalization in December 2021 when he came in with acute on chronic diastolic congestive heart failure.  Oxygenation on 2 L normally in the low 90s, but can drop into the 80s off of oxygen.  ED Course: Upon admission into the emergency department patient was seen to be afebrile, pulse 96-1 03, respirations 21-25, blood pressures maintained, and O2 saturations currently maintained on 2 L nasal cannula oxygen.  Labs significant for hemoglobin 12.6, BUN 28,  creatinine 1.03, glucose 132, and INR 1.1.  X-rays revealed a left intertrochanteric femur fracture with medial and fat.  Cardiology has been consulted and stated patient was medically cleared for cardiac standpoint for need of surgery.  Dr. Eulah Pont of orthopedics was formally consulted and plan to take patient to the operating room later today.  Patient has been given morphine, Zofran, normal saline IV fluids 125 mL/h, and tranexamic acid 1000 mg IV.  Review of Systems  Constitutional: Negative for fever and malaise/fatigue.  HENT: Negative for ear discharge and nosebleeds.   Eyes: Negative for pain and discharge.  Respiratory: Negative for cough and shortness of breath.   Cardiovascular: Negative for chest pain and leg swelling.  Gastrointestinal: Negative for abdominal pain, diarrhea and vomiting.  Genitourinary: Negative for dysuria and hematuria.  Musculoskeletal: Positive for falls and joint pain.  Skin: Negative for rash.  Neurological: Negative for focal weakness and loss of consciousness.  Psychiatric/Behavioral: Negative for memory loss and substance abuse.    Past Medical History:  Diagnosis Date  . Arthritis   . CHF (congestive heart failure) (HCC)   . COPD (chronic obstructive pulmonary disease) (HCC)   . Mitral regurgitation   . Persistent atrial fibrillation (HCC)    on coumadin   . RBBB   . Severe aortic stenosis     Past Surgical History:  Procedure Laterality Date  . APPENDECTOMY    . BUBBLE STUDY  09/07/2020   Procedure: BUBBLE STUDY;  Surgeon: Sande Rives, MD;  Location: [facility] ENDOSCOPY;  Service: Cardiovascular;;  . CARDIAC CATHETERIZATION    . CLAVICLE SURGERY    . RIGHT/LEFT HEART CATH AND CORONARY ANGIOGRAPHY N/A 08/24/2020   Procedure: RIGHT/LEFT HEART CATH AND CORONARY ANGIOGRAPHY;  Surgeon: Tonny Bollman, MD;  Location: [facility] INVASIVE CV LAB;  Service: Cardiovascular;  Laterality: N/A;  . TEE WITHOUT CARDIOVERSION N/A 09/07/2020   Procedure:  TRANSESOPHAGEAL ECHOCARDIOGRAM (TEE);  Surgeon: Sande Rives, MD;  Location: [facility] ENDOSCOPY;  Service: Cardiovascular;  Laterality: N/A;  . TOTAL KNEE ARTHROPLASTY Bilateral      reports that he quit smoking about 47 years ago. He has a 30.00 pack-year smoking history. He has never used smokeless tobacco. He reports previous alcohol use. He reports that he does not use drugs.  No Known Allergies  History reviewed. No pertinent family history.  Prior to Admission medications   Medication Sig Start Date End Date Taking? Authorizing Provider  acetaminophen (TYLENOL) 500 MG tablet Take 1,000 mg by mouth every 6 (six) hours as needed for headache (pain).    [provider]  ALPRAZolam Prudy Feeler) 0.25 MG tablet Take 0.25 mg by mouth at bedtime as needed for anxiety or sleep.    [provider]  Camphor-Menthol-Methyl Sal (SALONPAS) 3.08-11-08 % PTCH Place 1 patch onto the skin daily as needed (muscle aches/pain.).    [provider]  diclofenac Sodium (VOLTAREN) 1 % GEL Apply 1 application topically 4 (four) times daily as needed (PAIN.).    [provider]  docusate sodium (COLACE) 100 MG capsule Take 1 capsule (100 mg total) by mouth 2 (two) times daily. Patient taking differently: Take 100 mg by mouth 2 (two) times daily as needed (constipation.). 07/19/20   Osvaldo Shipper, MD  dutasteride (AVODART) 0.5 MG capsule Take 0.5 mg by mouth daily. 05/21/20   [provider]  Eyelid Cleansers (EYESCRUB) PADS Place 1 each into both eyes daily as needed (dry eye).    [provider]  Glycerin-Polysorbate 80 (REFRESH DRY EYE THERAPY OP) Place 1 drop into both eyes daily as needed (Dry eyes).    [provider]  guaiFENesin (MUCINEX) 600 MG 12 hr tablet Take 600 mg by mouth daily as needed for to loosen phlegm.    [provider]  HYDROcodone-acetaminophen (NORCO) 7.5-325 MG tablet Take 1 tablet by mouth 3 (three) times daily as  needed (pain). 03/28/20   [provider]  levalbuterol Pauline Aus HFA) 45 MCG/ACT inhaler Inhale 2 puffs into the lungs every 4 (four) hours as needed for wheezing or shortness of breath.    [provider]  levalbuterol Pauline Aus) 0.63 MG/3ML nebulizer solution Take 0.63 mg by nebulization 2 (two) times daily.    [provider]  METAMUCIL FIBER PO Take 1 capsule by mouth daily as needed (Constipation).    [provider]  methocarbamol (ROBAXIN) 500 MG tablet Take 500 mg by mouth every 6 (six) hours as needed for muscle spasms.    [provider]  metoprolol succinate (TOPROL-XL) 50 MG 24 hr tablet Take 50 mg by mouth daily. 04/23/20   [provider]  montelukast (SINGULAIR) 10 MG tablet Take 10 mg by mouth daily. 04/19/20   [provider]  polyethylene glycol (MIRALAX / GLYCOLAX) 17 g packet Take 17 g by mouth daily as needed for mild constipation. Patient taking differently: Take 17 g by mouth daily as needed (constipation.). 07/19/20   Osvaldo Shipper, MD  potassium chloride (KLOR-CON) 8 MEQ tablet Take 8 mEq by mouth daily.  06/25/20   [provider]  tamsulosin (FLOMAX) 0.4 MG CAPS capsule Take 0.8 mg by mouth at bedtime. 05/21/20   [provider]  tizanidine (ZANAFLEX) 2 MG capsule Take 2 mg by mouth at bedtime as needed for muscle spasms. 03/23/20   [provider]  torsemide (DEMADEX) 20 MG tablet Take 1 tablet (20 mg total) by mouth daily. 07/20/20   Osvaldo ShipperKrishnan, Gokul, MD  warfarin (COUMADIN) 6 MG tablet Take 6 mg by mouth at bedtime. 06/23/20   [provider]    Physical Exam:  Constitutional: Elderly male currently in no acute distress Vitals:   09/20/20 0803 09/20/20 0821 09/20/20 0900  BP: (!) 150/91  120/75  Pulse: (!) 103  96  Resp: (!) 25  (!) 21  Temp: 97.9 F (36.6 C)    TempSrc: Oral    SpO2: 93% 94% 98%   Eyes: PERRL, lids and conjunctivae normal ENMT: Mucous membranes  are moist. Posterior pharynx clear of any exudate or lesions. Hard of hearing Neck: normal, supple, no masses, no thyromegaly no JVD Respiratory: clear to auscultation bilaterally, no wheezing, no crackles. Normal respiratory effort. No accessory muscle use.  Cardiovascular: Irregular irregular rhythm with positive 3/6 systolic ejection murmur.  No significant lower extremity edema. Abdomen: no tenderness, no masses palpated. No hepatosplenomegaly. Bowel sounds positive.  Musculoskeletal: Left leg is mildly shortened and externally rotated.  Any movement causes significant pain. Skin: no rashes, lesions, ulcers. No induration Neurologic: CN 2-12 grossly intact. Sensation intact, DTR normal. Strength 5/5 in all 4.  Psychiatric: Normal judgment and insight. Alert and oriented x 3. Normal mood.     Labs on Admission: I have personally reviewed following labs and imaging studies  CBC: Recent Labs  Lab 09/16/20 1121 09/20/20 0803  WBC 7.0 7.7  NEUTROABS  --  6.2  HGB 13.0 12.6*  HCT 39.5 40.9  MCV 100.3* 103.3*  PLT 198 179   Basic Metabolic Panel: Recent Labs  Lab 09/16/20 1121  NA 138  K 3.8  CL 99  CO2 29  GLUCOSE 94  BUN 25*  CREATININE 1.00  CALCIUM 8.9   GFR: Estimated Creatinine Clearance: 49.9 mL/min (by C-G formula based on SCr of 1 mg/dL). Liver Function Tests: Recent Labs  Lab 09/16/20 1121  AST 17  ALT 12  ALKPHOS 74  BILITOT 1.3*  PROT 6.9  ALBUMIN 3.7   No results for input(s): LIPASE, AMYLASE in the last 168 hours. No results for input(s): AMMONIA in the last 168 hours. Coagulation Profile: Recent Labs  Lab 09/20/20 0803  INR 1.1   Cardiac Enzymes: No results for input(s): CKTOTAL, CKMB, CKMBINDEX, TROPONINI in the last 168 hours. BNP (last 3 results) No results for input(s): PROBNP in the last 8760 hours. HbA1C: No results for input(s): HGBA1C in the last 72 hours. CBG: No results for input(s): GLUCAP in the last 168 hours. Lipid  Profile: No results for input(s): CHOL, HDL, LDLCALC, TRIG, CHOLHDL, LDLDIRECT in the last 72 hours. Thyroid Function Tests: No results for input(s): TSH, T4TOTAL, FREET4, T3FREE, THYROIDAB in the last 72 hours. Anemia Panel: No results for input(s): VITAMINB12, FOLATE, FERRITIN, TIBC, IRON, RETICCTPCT in the last 72 hours. Urine analysis:    Component Value Date/Time   COLORURINE STRAW (A) 09/16/2020 1121   APPEARANCEUR CLEAR 09/16/2020 1121   LABSPEC 1.006 09/16/2020 1121   PHURINE 7.0 09/16/2020 1121   GLUCOSEU NEGATIVE 09/16/2020 1121   HGBUR MODERATE (A) 09/16/2020 1121   BILIRUBINUR NEGATIVE 09/16/2020 1121  KETONESUR NEGATIVE 09/16/2020 1121   PROTEINUR NEGATIVE 09/16/2020 1121   NITRITE NEGATIVE 09/16/2020 1121   LEUKOCYTESUR NEGATIVE 09/16/2020 1121   Sepsis Labs: Recent Results (from the past 240 hour(s))  Surgical pcr screen     Status: None   Collection Time: 09/16/20 11:21 AM   Specimen: Nasal Mucosa; Nasal Swab  Result Value Ref Range Status   MRSA, PCR NEGATIVE NEGATIVE Final   Staphylococcus aureus NEGATIVE NEGATIVE Final    Comment: (NOTE) The Xpert SA Assay (FDA approved for NASAL specimens in patients 41 years of age and older), is one component of a comprehensive surveillance program. It is not intended to diagnose infection nor to guide or monitor treatment. Performed at [facility], [address], [place], [place] [address]   SARS CORONAVIRUS 2 (TAT 6-24 HRS) Nasopharyngeal Nasopharyngeal Swab     Status: None   Collection Time: 09/16/20 11:34 AM   Specimen: Nasopharyngeal Swab  Result Value Ref Range Status   SARS Coronavirus 2 NEGATIVE NEGATIVE Final    Comment: (NOTE) SARS-CoV-2 target nucleic acids are NOT DETECTED.  The SARS-CoV-2 RNA is generally detectable in upper and lower respiratory specimens during the acute phase of infection. Negative results do not preclude SARS-CoV-2 infection, do not rule out co-infections with other  pathogens, and should not be used as the sole basis for treatment or other patient management decisions. Negative results must be combined with clinical observations, patient history, and epidemiological information. The expected result is Negative.  Fact Sheet for Patients: HairSlick.no  Fact Sheet for Healthcare Providers: quierodirigir.com  This test is not yet approved or cleared by the [place] FDA and  has been authorized for detection and/or diagnosis of SARS-CoV-2 by FDA under an Emergency Use Authorization (EUA). This EUA will remain  in effect (meaning this test can be used) for the duration of the COVID-19 declaration under Se ction 564(b)(1) of the Act, 21 U.S.C. section 360bbb-3(b)(1), unless the authorization is terminated or revoked sooner.  Performed at [facility], [address] [place], [place] [address]      Radiological Exams on Admission: DG Pelvis 1-2 Views  Result Date: 09/20/2020 CLINICAL DATA:  Fall this morning.  Left hip pain EXAM: PELVIS - 1-2 VIEW COMPARISON:  None. FINDINGS: Acute intertrochanteric left femur fracture with medial impaction. No evidence of pelvic ring fracture or diastasis. Osteopenia and atherosclerosis. IMPRESSION: Acute intertrochanteric left femur fracture. Electronically Signed   By: Marnee Spring M.D.   On: 09/20/2020 09:05   DG FEMUR MIN 2 VIEWS LEFT  Result Date: 09/20/2020 CLINICAL DATA:  Fall this morning.  Left hip pain EXAM: LEFT FEMUR 2 VIEWS COMPARISON:  None. FINDINGS: Acute intertrochanteric left femur fracture with medial impaction. Knee arthroplasty without acute superimposed finding. Generalized osteopenia and atherosclerosis. IMPRESSION: Intertrochanteric left femur fracture with medial impaction. Electronically Signed   By: Marnee Spring M.D.   On: 09/20/2020 09:04    EKG: Independently reviewed.  Atrial fibrillation 109 bpm with RBBB and QTCc  443  Assessment/Plan Intertrochanteric left femur fracture secondary to fall at home: Acute.  Patient presents after having what appeared to be a mechanical fall after missing a step at home causing him to fall.  X-ray imaging significant for intertrochanteric left femur fracture with medial impaction.  Dr. Eulah Pont orthopedics was formally consulted and a plan to take the surgery today. -Admit to medical telemetry bed -Hip fracture order set utilized -N.p.o. for need of procedure  -Hydrocodone/IV morphine as needed  pain -Decrease rate of normal saline IV fluids to 50 mL/h for 1 L -PT/OT to evaluate tomorrow morning  -Transitions of care consult -Appreciate orthopedic consultative services, we will follow-up for further recommendations  Aortic stenosis, chronic diastolic CHF: Recent transesophageal echocardiogram from 2/2 revealed EF of 50-55%, degenerative mitral valve with small flail P2 segment, with severe aortic stenosis.  Patient appears to be euvolemic at this time.  Cardiology had formally evaluated the patient and from their standpoint was cleared for surgery.  Cardiology noted TAVR to be postponed until patient able to recover from hip surgery.  -Strict intake and output -Check daily weights -Continue beta-blocker today and torsemide  Chronic respiratory failure with hypoxia COPD: Patient appears to chronically be on 2 L nasal cannula oxygen since being hospitalized with congestive heart failure in December 2021.  At this time no significant wheezes or rhonchi appreciated to suggest an acute exacerbation. -Continuous pulse oximetry with 2 L of nasal cannula oxygen continuously -Continue Singulair and levalbuterol neb   Chronic atrial fibrillation: On admission heart rates are in the low 100s in atrial fibrillation.  Patient has been off anticoagulation of Coumadin x1 week.  Orthopedics noted okay with restarting Coumadin as long as no significant complications with surgery on 2/16.  CHA2DS2-VASc score = 3. -Continue metoprolol XL -Pharmacy consultation for Coumadin to restart on 2/16  Dehydration: Patient noted to be just mildly dehydrated on admission with elevated BUN to creatinine ratio.  He had been n.p.o. after midnight for his scheduled TAVR -IV fluids as seen above  Macrocytic anemia: Hemoglobin 12.6 which appears near patient's baseline. -Check vitamin B12 and folate in a.m.  Essential hypertension: On admission blood pressure 96/54-120/75. -Continue Home blood pressure medications include metoprolol XL 50 mg daily with holding parameters  BPH -Continue Avodart and Flomax  DVT prophylaxis: SCD Code Status: Full code for surgical procedure Family Communication: Son updated over the phone Disposition Plan: Likely need of rehab/SNF Consults called: Orthopedics  Admission status: Inpatient status for need of surgical procedure  Clydie Braun MD [facility]   If 7PM-7AM, please contact night-coverage   09/20/2020, 9:34 AM

## 2020-09-20 NOTE — ED Triage Notes (Signed)
Patient BIB [facility] from home patient was headed to a cath lab appointment for valve surgery and fell when getting into the car. Patient stated he bumped his head however did not loss consciousness. Patient is alert and oriented x4, patient noted with deformity to left hip and inward rotation and shortening to left leg. Patient has been off Warfarin for 1 week due to having surgery. EMS gave of Fentanyl. EMS VS 130/70,100,98% on 2 L (patient is always on O2 for CHF).  PMS positive to left foot however faint pulse.

## 2020-09-21 ENCOUNTER — Other Ambulatory Visit: Payer: Self-pay

## 2020-09-21 ENCOUNTER — Encounter (HOSPITAL_COMMUNITY): Payer: Self-pay | Admitting: Internal Medicine

## 2020-09-21 LAB — BASIC METABOLIC PANEL
Anion gap: 8 (ref 5–15)
BUN: 26 mg/dL — ABNORMAL HIGH (ref 8–23)
CO2: 26 mmol/L (ref 22–32)
Calcium: 8.4 mg/dL — ABNORMAL LOW (ref 8.9–10.3)
Chloride: 104 mmol/L (ref 98–111)
Creatinine, Ser: 0.92 mg/dL (ref 0.61–1.24)
GFR, Estimated: >60 mL/min (ref >60)
Glucose, Bld: 123 mg/dL — ABNORMAL HIGH (ref 70–99)
Potassium: 4.4 mmol/L (ref 3.5–5.1)
Sodium: 138 mmol/L (ref 135–145)

## 2020-09-21 LAB — CBC
HCT: 30 % — ABNORMAL LOW (ref 39.0–52.0)
Hemoglobin: 9.9 g/dL — ABNORMAL LOW (ref 13.0–17.0)
MCH: 33.4 pg (ref 26.0–34.0)
MCHC: 33 g/dL (ref 30.0–36.0)
MCV: 101.4 fL — ABNORMAL HIGH (ref 80.0–100.0)
Platelets: 148 10*3/uL — ABNORMAL LOW (ref 150–400)
RBC: 2.96 MIL/uL — ABNORMAL LOW (ref 4.22–5.81)
RDW: 15 % (ref 11.5–15.5)
WBC: 5.4 10*3/uL (ref 4.0–10.5)
nRBC: 0 % (ref 0.0–0.2)

## 2020-09-21 LAB — PROTIME-INR
INR: 1.3 — ABNORMAL HIGH (ref 0.8–1.2)
Prothrombin Time: 15.4 seconds — ABNORMAL HIGH (ref 11.4–15.2)

## 2020-09-21 LAB — FOLATE: Folate: 8.9 ng/mL (ref >5.9)

## 2020-09-21 LAB — MAGNESIUM: Magnesium: 1.9 mg/dL (ref 1.7–2.4)

## 2020-09-21 LAB — VITAMIN B12: Vitamin B-12: 348 pg/mL (ref 180–914)

## 2020-09-21 LAB — PHOSPHORUS: Phosphorus: 3.5 mg/dL (ref 2.5–4.6)

## 2020-09-21 MED ORDER — ORAL CARE MOUTH RINSE
15.0000 mL | Freq: Two times a day (BID) | OROMUCOSAL | Status: DC
  Administered 2020-09-21 – 2020-09-23 (×4): 15 mL via OROMUCOSAL

## 2020-09-21 NOTE — Evaluation (Signed)
Occupational Therapy Evaluation Patient Details Name: Ruben [name] MRN: [id number] DOB: 1927/01/23 Today's Date: 09/21/2020    History of Present Illness Ruben [name] is an 87 y.o. male.   HPI: Ruben [name] was going out to get in the car to come have a TAVR when he missed a step and fell. He had immediate left hip pain and could not get up. He was brought to the ED where x-ray showed a left hip fx and orthopedic surgery was consulted.Ruben [name] is an 87 y.o. male.   HPI: Ruben [name] was going out to get in the car to come have a TAVR when he missed a step and fell. He had immediate left hip pain and could not get up. He was brought to the ED where x-ray showed a left hip fx and orthopedic surgery was consulted. Pt. underwent IM nailing 09/21/20.Pt. PMN: AFIB, CHF, COPD, ON HOME O2.   Clinical Impression   PATIENT WAS SEEN WITH PT TO INCREASE I AND SAFETY WITH MOBILITY AND ADLS. 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 PATIENT AND HIS WIFE LIVE WITH HIS SON AND DTR IN LAW. PATIENT WAS ABLE TO PREFORM TRANSFER WITH 2 PERSON ASSIST. PATIENT AND FAMILY ARE AN AGREEMENT WITH NURSING HOME PLACEMENT. ACUTE OT TO FOLLOW.    Follow Up Recommendations  SNF    Equipment Recommendations  None recommended by OT    Recommendations for Other Services       Precautions / Restrictions Precautions Precautions: Fall Restrictions Weight Bearing Restrictions: Yes LLE Weight Bearing: Weight bearing as tolerated      Mobility Bed Mobility Overal bed mobility: Needs Assistance Bed Mobility: Supine to Sit     Supine to sit: Mod assist     General bed mobility comments: WITH HOB ELEVATED    Transfers Overall transfer level: Needs assistance Equipment used: Rolling walker (2 wheeled) Transfers: Sit to/from [employer] Sit to Stand: Mod assist;+2 physical assistance;+2 safety/equipment Stand pivot transfers: Mod assist;+2 physical assistance;+2 safety/equipment             Balance Overall balance assessment: Mild deficits observed, not formally tested                                         ADL either performed or assessed with clinical judgement   ADL Overall ADL's : Needs assistance/impaired Eating/Feeding: Independent   Grooming: Wash/dry hands;Wash/dry face;Set up;Sitting   Upper Body Bathing: Set up;Sitting;Supervision/ safety   Lower Body Bathing: Maximal assistance;+2 for physical assistance;+2 for safety/equipment;Sit to/from stand   Upper Body Dressing : Minimal assistance;Sitting   Lower Body Dressing: Total assistance;+2 for physical assistance;+2 for safety/equipment;Sit to/from stand   Toilet Transfer: Moderate assistance;+2 for physical assistance;+2 for safety/equipment;BSC;RW   Toileting- Clothing Manipulation and Hygiene: Total assistance;+2 for physical assistance;+2 for safety/equipment;Sit to/from stand       Functional mobility during ADLs: Moderate assistance;+2 for physical assistance;+2 for safety/equipment;Rolling walker General ADL Comments: PATIENT WAS ABLE TO SIT EOB MIN GUARD ASSIST AND WILL NEED FURTHER ED ON USE OF AE FOR LE ADLS.     Vision Baseline Vision/History: Wears glasses Wears Glasses: Reading only (Pt. had b cataract sx) Patient Visual Report: No change from baseline Vision Assessment?: No apparent visual deficits     Perception     Praxis      Pertinent Vitals/Pain Pain  Assessment: 0-10 Pain Score: 5  Pain Location: l hip Pain Descriptors / Indicators: Sharp Pain Intervention(s): Premedicated before session;Monitored during session     Hand Dominance Right   Extremity/Trunk Assessment Upper Extremity Assessment Upper Extremity Assessment: RUE deficits/detail RUE Deficits / Details: shoulder flex 80 and is limted by arthritis per pt.           Communication Communication Communication: HOH   Cognition Arousal/Alertness: Awake/alert Behavior During  Therapy: WFL for tasks assessed/performed Overall Cognitive Status: Within Functional Limits for tasks assessed                                     General Comments       Exercises     Shoulder Instructions      Home Living Family/patient expects to be discharged to:: Private residence Living Arrangements: Children;Spouse/significant other Available Help at Discharge: Available 24 hours/day;Family;Personal care attendant Type of Home: House Home Access: Stairs to enter [employer] of Steps: 2 Entrance Stairs-Rails: None Home Layout: Two level;Able to live on main level with bedroom/bathroom     Bathroom Shower/Tub: Producer, television/film/video: Standard Bathroom Accessibility: Yes   Home Equipment: Bedside commode;Grab bars - tub/shower;Hand held shower head;Shower seat;Walker - 2 wheels;Cane - single point;Hospital bed   Additional Comments: Living at son's home.  Assists spouse after recent CVA      Prior Functioning/Environment Level of Independence: Independent        Comments: Pt drives, cares for his own meds and needs no assist with ADL, IADL.        OT Problem List: Decreased activity tolerance;Impaired balance (sitting and/or standing);Decreased knowledge of use of DME or AE;Pain      OT Treatment/Interventions: Self-care/ADL training;DME and/or AE instruction;Therapeutic activities;Patient/family education    OT Goals(Current goals can be found in the care plan section) Acute Rehab OT Goals Patient Stated Goal: TO BE ABLE TO TAKE CARE OF MYSELF OT Goal Formulation: With patient Time For Goal Achievement: 10/05/20 Potential to Achieve Goals: Good ADL Goals Pt Will Perform Grooming: with modified independence;standing Pt Will Perform Lower Body Bathing: with modified independence;sit to/from stand Pt Will Perform Lower Body Dressing: with modified independence;with adaptive equipment;sit to/from stand Pt Will Transfer to  Toilet: with modified independence;bedside commode Pt Will Perform Toileting - Clothing Manipulation and hygiene: with modified independence;sit to/from stand  OT Frequency: Min 2X/week   Barriers to D/C: Decreased caregiver support  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 FAMILY WILL BE GETTING SOMEONE TO ASSIST AT HOME.       Co-evaluation PT/OT/SLP Co-Evaluation/Treatment: Yes Reason for Co-Treatment: For patient/therapist safety          AM-PAC OT "6 Clicks" Daily Activity     Outcome Measure Help from another person eating meals?: None Help from another person taking care of personal grooming?: A Little Help from another person toileting, which includes using toliet, bedpan, or urinal?: Total Help from another person bathing (including washing, rinsing, drying)?: A Lot Help from another person to put on and taking off regular upper body clothing?: A Little Help from another person to put on and taking off regular lower body clothing?: Total 6 Click Score: 14   End of Session Equipment Utilized During Treatment: Gait belt;Rolling walker;Oxygen Nurse Communication:  (OK THERAPY)  Activity Tolerance: Patient limited by pain Patient left: in chair;with call bell/phone within reach;with family/visitor present  OT Visit Diagnosis:  Unsteadiness on feet (R26.81);Pain                Time: 2585-2778 OT Time Calculation (min): 25 min Charges:  OT General Charges $OT Visit: 1 Visit OT Evaluation $OT Eval Moderate Complexity: 1 Mod  Derrek Gu OT/L   Darcie Mellone 09/21/2020, 1:26 PM

## 2020-09-21 NOTE — Progress Notes (Signed)
   ORTHOPAEDIC PROGRESS NOTE  s/p Procedure(s): INTRAMEDULLARY (IM) NAIL INTERTROCHANTRIC on 09/20/2020 with Dr. Eulah Pont  SUBJECTIVE: He reports mild pain about operative site. Slept reasonably last night. No other complaints.   OBJECTIVE: PE: General: resting comfortably in hospital bed Left lower extremity: dressing CDI, mild tenderness to palpation left hip, non-tender to palpation left knee, intact EHL/TA/GSC, endorses distal sensation, warm well perfused foot   Vitals:   09/21/20 0454 09/21/20 0600  BP: (!) 83/57 (!) 109/95  Pulse: 67 74  Resp: 16   Temp: 98.5 F (36.9 C)   SpO2: (!) 88%      ASSESSMENT: Ruben [name] is a 87 y.o. male doing well postoperatively. POD#1  PLAN: Weightbearing: WBAT LLE Insicional and dressing care: Reinforce dressings as needed Orthopedic device(s): None Showering: Post-op day #3 with assistance VTE prophylaxis: Resume Warfarin Pain control: PRN pain medications, preferring oral medications Follow - up plan: 2 weeks in office with Dr. Eulah Pont Dispo: TBD. PT/OT evals today.   Contact information:  Dr. Renaye Rakers, Alfonse Alpers PA-C, After hours and holidays please check [email or web address] for group call information for [facility]   Alfonse Alpers, PA-C 09/21/2020

## 2020-09-21 NOTE — Progress Notes (Signed)
PROGRESS NOTE    Ruben [name]  OMV:[id number] DOB: February 21, 1927 DOA: 09/20/2020 PCP: Clovis Riley, L.August Saucer, MD    Brief Narrative:  Patient is 87 year old gentleman with history of severe aortic stenosis, diastolic congestive heart failure, persistent A. fib on anticoagulation with Coumadin, COPD and chronic hypoxic failure on 2 L oxygen who was getting ready to go to TAVR in the morning, lost balance after overstepping on steps at home and fell on his left hip. In the emergency room hemodynamically stable.  INR 1.1.  X-ray with left intertrochanteric femur fracture.  Underwent ORIF.    Assessment & Plan:   Principal Problem:   Trochanteric fracture of left femur (HCC) Active Problems:   Chronic diastolic CHF (congestive heart failure) (HCC)   Atrial fibrillation, chronic (HCC)   Severe aortic stenosis   COPD (chronic obstructive pulmonary disease) (HCC)   Fall at home, initial encounter   BPH (benign prostatic hyperplasia)  Closed traumatic intertrochanteric fracture left hip: Status post ORIF with intramedullary nail left hip 2/15 Weightbearing as tolerated Pain control with Robaxin, Vicodin, local measures and ice compression Start working with PT OT.  Refer to skilled nursing facility for rehab. Coumadin resumed today 2/16 for DVT prophylaxis. Postop dressing instructions and follow-up as per orthopedics.  COPD with chronic hypoxia: On 2 L oxygen at home.  We will continue.  Severe aortic stenosis: Was scheduled for TAVR on 2/15.  Seen by cardiology.  They will reschedule after rehab.  Chronic atrial fibrillation: Rate controlled.  Resume Coumadin tonight.   DVT prophylaxis: SCDs Start: 09/20/20 2035 Place TED hose Start: 09/20/20 2035 SCDs Start: 09/20/20 1003 warfarin (COUMADIN) tablet 6 mg   Code Status: Full code Family Communication: None at the bedside Disposition Plan: Status is: Inpatient  Remains inpatient appropriate because:Inpatient level of care appropriate due  to severity of illness   Dispo: The patient is from: Home              Anticipated d/c is to: SNF              Anticipated d/c date is: 2 days              Patient currently is not medically stable to d/c.   Difficult to place patient No         Consultants:   Orthopedics  Cardiology  Procedures:   Left intramedullary nail, Dr. Eulah Pont 2/15  Antimicrobials:   Perioperative   Subjective: I saw patient in the morning rounds.  He was complaining of some spasm on his left incision.  Denies any other complaints.  He wants to get stronger and go back help his wife at home will has just suffered from stroke.  No other overnight events.  Objective: Vitals:   09/21/20 0841 09/21/20 0917 09/21/20 1042 09/21/20 1501  BP:  (!) 89/63 104/74 93/64  Pulse:  88 93 90  Resp:  20 20 18   Temp:  98.6 F (37 C)  98 F (36.7 C)  TempSrc:  Oral  Oral  SpO2: 92% 99% 95% 94%  Weight:      Height:        Intake/Output Summary (Last 24 hours) at 09/21/2020 1505 Last data filed at 09/21/2020 1300 Gross per 24 hour  Intake 1627 ml  Output 350 ml  Net 1277 ml   Filed Weights   09/21/20 0820  Weight: 87.5 kg    Examination:  General exam: Appears calm and comfortable  Appropriate for age. Respiratory system: Clear to  auscultation. Respiratory effort normal. On 2 L oxygen.  Looks comfortable. Cardiovascular system: S1 & S2 heard, RRR.  Pansystolic murmur at aortic area.  Gastrointestinal system: Abdomen is nondistended, soft and nontender. No organomegaly or masses felt. Normal bowel sounds heard. Central nervous system: Alert and oriented. No focal neurological deficits. Extremities: Left lateral hip incision clean and dry.  Minimal swelling as anticipated from surgery.  Distal neurovascular status intact.    Data Reviewed: I have personally reviewed following labs and imaging studies  CBC: Recent Labs  Lab 09/16/20 1121 09/20/20 0803 09/21/20 0459  WBC 7.0 7.7 5.4   NEUTROABS  --  6.2  --   HGB 13.0 12.6* 9.9*  HCT 39.5 40.9 30.0*  MCV 100.3* 103.3* 101.4*  PLT 198 179 148*   Basic Metabolic Panel: Recent Labs  Lab 09/16/20 1121 09/20/20 0803 09/21/20 0459 09/21/20 1114  NA 138 141 138  --   K 3.8 4.5 4.4  --   CL 99 105 104  --   CO2 29 27 26   --   GLUCOSE 94 132* 123*  --   BUN 25* 28* 26*  --   CREATININE 1.00 1.03 0.92  --   CALCIUM 8.9 9.0 8.4*  --   MG  --   --   --  1.9  PHOS  --   --   --  3.5   GFR: Estimated Creatinine Clearance: 54.2 mL/min (by C-G formula based on SCr of 0.92 mg/dL). Liver Function Tests: Recent Labs  Lab 09/16/20 1121 09/20/20 0803  AST 17 16  ALT 12 12  ALKPHOS 74 71  BILITOT 1.3* 1.4*  PROT 6.9 6.7  ALBUMIN 3.7 3.5   No results for input(s): LIPASE, AMYLASE in the last 168 hours. No results for input(s): AMMONIA in the last 168 hours. Coagulation Profile: Recent Labs  Lab 09/20/20 0803 09/21/20 0459  INR 1.1 1.3*   Cardiac Enzymes: No results for input(s): CKTOTAL, CKMB, CKMBINDEX, TROPONINI in the last 168 hours. BNP (last 3 results) No results for input(s): PROBNP in the last 8760 hours. HbA1C: No results for input(s): HGBA1C in the last 72 hours. CBG: No results for input(s): GLUCAP in the last 168 hours. Lipid Profile: No results for input(s): CHOL, HDL, LDLCALC, TRIG, CHOLHDL, LDLDIRECT in the last 72 hours. Thyroid Function Tests: No results for input(s): TSH, T4TOTAL, FREET4, T3FREE, THYROIDAB in the last 72 hours. Anemia Panel: Recent Labs    09/21/20 0459  VITAMINB12 348  FOLATE 8.9   Sepsis Labs: No results for input(s): PROCALCITON, LATICACIDVEN in the last 168 hours.  Recent Results (from the past 240 hour(s))  Surgical pcr screen     Status: None   Collection Time: 09/16/20 11:21 AM   Specimen: Nasal Mucosa; Nasal Swab  Result Value Ref Range Status   MRSA, PCR NEGATIVE NEGATIVE Final   Staphylococcus aureus NEGATIVE NEGATIVE Final    Comment: (NOTE) The  Xpert SA Assay (FDA approved for NASAL specimens in patients 85 years of age and older), is one component of a comprehensive surveillance program. It is not intended to diagnose infection nor to guide or monitor treatment. Performed at [facility], [address] [place], [place] [address]   SARS CORONAVIRUS 2 (TAT 6-24 HRS) Nasopharyngeal Nasopharyngeal Swab     Status: None   Collection Time: 09/16/20 11:34 AM   Specimen: Nasopharyngeal Swab  Result Value Ref Range Status   SARS Coronavirus 2 NEGATIVE NEGATIVE Final    Comment: (  NOTE) SARS-CoV-2 target nucleic acids are NOT DETECTED.  The SARS-CoV-2 RNA is generally detectable in upper and lower respiratory specimens during the acute phase of infection. Negative results do not preclude SARS-CoV-2 infection, do not rule out co-infections with other pathogens, and should not be used as the sole basis for treatment or other patient management decisions. Negative results must be combined with clinical observations, patient history, and epidemiological information. The expected result is Negative.  Fact Sheet for Patients: HairSlick.no  Fact Sheet for Healthcare Providers: quierodirigir.com  This test is not yet approved or cleared by the [place] FDA and  has been authorized for detection and/or diagnosis of SARS-CoV-2 by FDA under an Emergency Use Authorization (EUA). This EUA will remain  in effect (meaning this test can be used) for the duration of the COVID-19 declaration under Se ction 564(b)(1) of the Act, 21 U.S.C. section 360bbb-3(b)(1), unless the authorization is terminated or revoked sooner.  Performed at [facility], [address] [place], [place] [address]   SARS CORONAVIRUS 2 (TAT 6-24 HRS) Nasopharyngeal Nasopharyngeal Swab     Status: None   Collection Time: 09/20/20  3:00 PM   Specimen: Nasopharyngeal Swab  Result Value Ref Range  Status   SARS Coronavirus 2 NEGATIVE NEGATIVE Final    Comment: (NOTE) SARS-CoV-2 target nucleic acids are NOT DETECTED.  The SARS-CoV-2 RNA is generally detectable in upper and lower respiratory specimens during the acute phase of infection. Negative results do not preclude SARS-CoV-2 infection, do not rule out co-infections with other pathogens, and should not be used as the sole basis for treatment or other patient management decisions. Negative results must be combined with clinical observations, patient history, and epidemiological information. The expected result is Negative.  Fact Sheet for Patients: HairSlick.no  Fact Sheet for Healthcare Providers: quierodirigir.com  This test is not yet approved or cleared by the [place] FDA and  has been authorized for detection and/or diagnosis of SARS-CoV-2 by FDA under an Emergency Use Authorization (EUA). This EUA will remain  in effect (meaning this test can be used) for the duration of the COVID-19 declaration under Se ction 564(b)(1) of the Act, 21 U.S.C. section 360bbb-3(b)(1), unless the authorization is terminated or revoked sooner.  Performed at [facility], [address] [place], [place] [address]          Radiology Studies: DG Pelvis 1-2 Views  Result Date: 09/20/2020 CLINICAL DATA:  Fall this morning.  Left hip pain EXAM: PELVIS - 1-2 VIEW COMPARISON:  None. FINDINGS: Acute intertrochanteric left femur fracture with medial impaction. No evidence of pelvic ring fracture or diastasis. Osteopenia and atherosclerosis. IMPRESSION: Acute intertrochanteric left femur fracture. Electronically Signed   By: Marnee Spring M.D.   On: 09/20/2020 09:05   DG C-Arm 1-60 Min  Result Date: 09/20/2020 CLINICAL DATA:  Left femur IM nail. EXAM: OPERATIVE LEFT HIP (WITH PELVIS IF PERFORMED) TECHNIQUE: Fluoroscopic spot image(s) were submitted for interpretation  post-operatively. COMPARISON:  Preoperative radiograph. FINDINGS: Four fluoroscopic spot views of the left femur obtained in the operating room. Intramedullary nail with trans trochanteric and distal locking screws fixate intertrochanteric femur fracture. Total fluoroscopy time 1 minutes 38 seconds. IMPRESSION: Procedural fluoroscopy after left femur IM nail. Electronically Signed   By: Narda Rutherford M.D.   On: 09/20/2020 20:57   DG Hip Port Unilat With Pelvis 1V Left  Result Date: 09/20/2020 CLINICAL DATA:  Status post IM nail. EXAM: DG HIP (WITH OR WITHOUT PELVIS) 1V  PORT LEFT COMPARISON:  Preoperative radiographs earlier this day. FINDINGS: Intramedullary nail with trans trochanteric and distal screw fixation of intertrochanteric femur fracture. Improved fracture alignment from preoperative imaging. Lesser trochanteric fragment remains displaced. Knee arthroplasty partially included. Recent postsurgical change includes air and edema in the adjacent soft tissues. IMPRESSION: ORIF of intertrochanteric left femur fracture, in improved alignment from preoperative imaging. No immediate postoperative complication. Electronically Signed   By: Narda Rutherford M.D.   On: 09/20/2020 19:12   DG HIP OPERATIVE UNILAT W OR W/O PELVIS LEFT  Result Date: 09/20/2020 CLINICAL DATA:  Left femur IM nail. EXAM: OPERATIVE LEFT HIP (WITH PELVIS IF PERFORMED) TECHNIQUE: Fluoroscopic spot image(s) were submitted for interpretation post-operatively. COMPARISON:  Preoperative radiograph. FINDINGS: Four fluoroscopic spot views of the left femur obtained in the operating room. Intramedullary nail with trans trochanteric and distal locking screws fixate intertrochanteric femur fracture. Total fluoroscopy time 1 minutes 38 seconds. IMPRESSION: Procedural fluoroscopy after left femur IM nail. Electronically Signed   By: Narda Rutherford M.D.   On: 09/20/2020 20:57   DG FEMUR MIN 2 VIEWS LEFT  Result Date: 09/20/2020 CLINICAL  DATA:  Fall this morning.  Left hip pain EXAM: LEFT FEMUR 2 VIEWS COMPARISON:  None. FINDINGS: Acute intertrochanteric left femur fracture with medial impaction. Knee arthroplasty without acute superimposed finding. Generalized osteopenia and atherosclerosis. IMPRESSION: Intertrochanteric left femur fracture with medial impaction. Electronically Signed   By: Marnee Spring M.D.   On: 09/20/2020 09:04        Scheduled Meds: . acetaminophen  500 mg Oral Q6H  . docusate sodium  100 mg Oral BID  . dutasteride  0.5 mg Oral Daily  . feeding supplement  237 mL Oral BID BM  . levalbuterol  0.63 mg Nebulization BID  . mouth rinse  15 mL Mouth Rinse BID  . metoprolol succinate  50 mg Oral Daily  . montelukast  10 mg Oral Daily  . multivitamin with minerals  1 tablet Oral Daily  . senna  1 tablet Oral BID  . tamsulosin  0.8 mg Oral QHS  . torsemide  20 mg Oral Daily  . warfarin  6 mg Oral q1600  . Warfarin - Pharmacist Dosing Inpatient   Does not apply q1600   Continuous Infusions: . sodium chloride 10 mL/hr at 09/20/20 2105     LOS: 1 day    Time spent: 30 minutes    Dorcas Carrow, MD [facility] Pager [phone]

## 2020-09-21 NOTE — [place] FL2 (Addendum)
[place] MEDICAID FL2 LEVEL OF CARE SCREENING TOOL     IDENTIFICATION  Patient Name: Ruben [name] Birthdate: [birth year] Sex: male Admission Date (Current Location): 09/20/2020  [facility] and [place] Number:  Producer, television/film/video and Address:  [facility], [address] [place], [place] [address]      Provider Number: [id number]  Attending Physician Name and Address:  Dorcas Carrow, MD  Relative Name and Phone Number:       Current Level of Care: Hospital Recommended Level of Care: Skilled Nursing Facility Prior Approval Number:    Date Approved/Denied:   PASRR Number:   [id number]  Discharge Plan: SNF    Current Diagnoses: Patient Active Problem List   Diagnosis Date Noted  . Trochanteric fracture of left femur (HCC) 09/20/2020  . Fall at home, initial encounter 09/20/2020  . BPH (benign prostatic hyperplasia) 09/20/2020  . Mitral regurgitation   . COPD (chronic obstructive pulmonary disease) (HCC)   . Acute on chronic congestive heart failure (HCC)   . Severe aortic stenosis   . Chronic diastolic CHF (congestive heart failure) (HCC) 07/15/2020  . COPD with acute exacerbation (HCC) 07/15/2020  . Atrial fibrillation, chronic (HCC) 07/15/2020    Orientation RESPIRATION BLADDER Height & Weight     Self,Time,Situation,Place  O2 (2l/min [place]) Continent Weight: 87.5 kg Height:  5\' 6"  (167.6 cm)  BEHAVIORAL SYMPTOMS/MOOD NEUROLOGICAL BOWEL NUTRITION STATUS      Continent Diet (Referral to d/c summary)  AMBULATORY STATUS COMMUNICATION OF NEEDS Skin   Extensive Assist Verbally Surgical wounds (2/16 s/p IMN to L hip)                       Personal Care Assistance Level of Assistance  Feeding,Dressing,Bathing Bathing Assistance: Maximum assistance Feeding assistance: Independent Dressing Assistance: Maximum assistance     Functional Limitations Info  Sight,Hearing,Speech Sight Info: Adequate Hearing Info: Adequate Speech Info: Adequate     SPECIAL CARE FACTORS FREQUENCY  PT (By licensed PT),OT (By licensed OT)     PT Frequency: 5x/week, evaluate and treat OT Frequency: 5x/week, evaluate and treat            Contractures Contractures Info: Not present    Additional Factors Info  Code Status,Allergies Code Status Info: Full Code Allergies Info: No known allergies           Current Medications (09/21/2020):  This is the current hospital active medication list Current Facility-Administered Medications  Medication Dose Route Frequency Provider Last Rate Last Admin  . 0.9 %  sodium chloride infusion   Intravenous Continuous 09/23/2020 K, PA-C 10 mL/hr at 09/21/20 1642 Infusion Verify at 09/21/20 1642  . acetaminophen (TYLENOL) tablet 325-650 mg  325-650 mg Oral Q6H PRN 09/23/20 K, PA-C      . acetaminophen (TYLENOL) tablet 500 mg  500 mg Oral Q6H Janine Ores K, PA-C   500 mg at 09/21/20 1203  . ALPRAZolam 09/23/20) tablet 0.25 mg  0.25 mg Oral QHS PRN Prudy Feeler K, PA-C      . alum & mag hydroxide-simeth (MAALOX/MYLANTA) 200-200-20 MG/5ML suspension 30 mL  30 mL Oral Q4H PRN 02-12-2001 K, PA-C      . bisacodyl (DULCOLAX) suppository 10 mg  10 mg Rectal Daily PRN Janine Ores K, PA-C      . docusate sodium (COLACE) capsule 100 mg  100 mg Oral BID PRN Janine Ores K, PA-C      . docusate sodium (COLACE)  capsule 100 mg  100 mg Oral BID Janine Ores K, PA-C   100 mg at 09/21/20 1036  . dutasteride (AVODART) capsule 0.5 mg  0.5 mg Oral Daily Janine Ores K, PA-C   0.5 mg at 09/21/20 1036  . feeding supplement (ENSURE ENLIVE / ENSURE PLUS) liquid 237 mL  237 mL Oral BID BM Brown, Blaine K, PA-C   237 mL at 09/21/20 1344  . guaiFENesin (MUCINEX) 12 hr tablet 600 mg  600 mg Oral Daily PRN Janine Ores K, PA-C   600 mg at 09/20/20 2057  . HYDROcodone-acetaminophen (NORCO/VICODIN) 5-325 MG per tablet 1-2 tablet  1-2 tablet Oral Q6H PRN Janine Ores K, PA-C   1 tablet at 09/21/20 1354  . levalbuterol  (XOPENEX) nebulizer solution 0.63 mg  0.63 mg Nebulization BID Janine Ores K, PA-C   0.63 mg at 09/21/20 0841  . magnesium citrate solution 1 Bottle  1 Bottle Oral Once PRN Janine Ores K, PA-C      . MEDLINE mouth rinse  15 mL Mouth Rinse BID Dorcas Carrow, MD      . menthol-cetylpyridinium (CEPACOL) lozenge 3 mg  1 lozenge Oral PRN Janine Ores K, PA-C       Or  . phenol (CHLORASEPTIC) mouth spray 1 spray  1 spray Mouth/Throat PRN Janine Ores K, PA-C      . methocarbamol (ROBAXIN) tablet 500 mg  500 mg Oral Q6H PRN Janine Ores K, PA-C   500 mg at 09/21/20 1203  . metoprolol succinate (TOPROL-XL) 24 hr tablet 50 mg  50 mg Oral Daily Janine Ores K, PA-C   50 mg at 09/21/20 1100  . montelukast (SINGULAIR) tablet 10 mg  10 mg Oral Daily Janine Ores K, PA-C   10 mg at 09/21/20 1036  . morphine 2 MG/ML injection 0.5-1 mg  0.5-1 mg Intravenous Q2H PRN Janine Ores K, PA-C   0.5 mg at 09/20/20 2057  . multivitamin with minerals tablet 1 tablet  1 tablet Oral Daily Armida Sans, PA-C   1 tablet at 09/21/20 1036  . ondansetron (ZOFRAN) tablet 4 mg  4 mg Oral Q6H PRN Janine Ores K, PA-C       Or  . ondansetron [facility]) injection 4 mg  4 mg Intravenous Q6H PRN Janine Ores K, PA-C      . polyethylene glycol (MIRALAX / GLYCOLAX) packet 17 g  17 g Oral Daily PRN Janine Ores K, PA-C      . senna (SENOKOT) tablet 8.6 mg  1 tablet Oral BID Janine Ores K, PA-C   8.6 mg at 09/21/20 1036  . tamsulosin (FLOMAX) capsule 0.8 mg  0.8 mg Oral QHS Janine Ores K, PA-C   0.8 mg at 09/20/20 2057  . torsemide (DEMADEX) tablet 20 mg  20 mg Oral Daily Janine Ores K, PA-C   20 mg at 09/21/20 1100  . traMADol (ULTRAM) tablet 50 mg  50 mg Oral Q6H PRN Janine Ores K, PA-C   50 mg at 09/21/20 4270  . warfarin (COUMADIN) tablet 6 mg  6 mg Oral q1600 Clydie Braun, MD      . Warfarin - Pharmacist Dosing Inpatient   Does not apply q1600 Clydie Braun, MD         Discharge Medications: Please  see discharge summary for a list of discharge medications.  Relevant Imaging Results:  Relevant Lab Results:   Additional Information SS# [id number]  Epifanio Lesches, RN

## 2020-09-21 NOTE — TOC Initial Note (Signed)
Transition of Care [facility]) - Initial/Assessment Note    Patient Details  Name: Ruben [name] MRN: [id number] Date of Birth: 01-Apr-1927  Transition of Care [facility]) CM/SW Contact:    Epifanio Lesches, RN Phone Number: 09/21/2020, 4:19 PM  Clinical Narrative:     Presented after a fall, suffered L hip fx .      - s/p  IM nailing 09/21/20 Pt from home with family (wife/ recent CVA,  son and daughter in law). PTA independent with ADL's. Pt with home oxygen.  RNCM received consult for possible SNF placement at time of discharge. RNCM spoke with patient and patient's son @ bedside regarding PT recommendation of SNF placement at time of discharge. Son  reported patient  is currently unable to care for self independently at their home given patient's current physical needs and fall risk. Patient is agreeable to SNF placement at time of discharge. Patient reports preference for [facility]. RNCM discussed insurance authorization process and provided Medicare SNF ratings list.  RNCM to continue to follow and assist with discharge planning needs.   Expected Discharge Plan: Skilled Nursing Facility Barriers to Discharge: Continued Medical Work up,No SNF bed   Patient Goals and CMS Choice     Choice offered to / list presented to : Adult Children  Expected Discharge Plan and Services Expected Discharge Plan: Skilled Nursing Facility   Discharge Planning Services: CM Consult   Living arrangements for the past 2 months: Single Family Home                                      Prior Living Arrangements/Services Living arrangements for the past 2 months: Single Family Home Lives with:: Adult Children Patient language and need for interpreter reviewed:: Yes        Need for Family Participation in Patient Care: Yes (Comment) Care giver support system in place?: Yes (comment)   Criminal Activity/Legal Involvement Pertinent to Current Situation/Hospitalization: No - Comment as  needed  Activities of Daily Living Home Assistive Devices/Equipment: Dentures (specify type),Hearing aid,Oxygen,Walker (specify type) ADL Screening (condition at time of admission) Patient's cognitive ability adequate to safely complete daily activities?: Yes Is the patient deaf or have difficulty hearing?: Yes Does the patient have difficulty seeing, even when wearing glasses/contacts?: No Does the patient have difficulty concentrating, remembering, or making decisions?: No Patient able to express need for assistance with ADLs?: Yes Does the patient have difficulty dressing or bathing?: No Independently performs ADLs?: Yes (appropriate for developmental age) Does the patient have difficulty walking or climbing stairs?: No Weakness of Legs: Left Weakness of Arms/Hands: None  Permission Sought/Granted                  Emotional Assessment Appearance:: Appears younger than stated age Attitude/Demeanor/Rapport: Gracious Affect (typically observed): Accepting Orientation: : Oriented to Self,Oriented to Place,Oriented to  Time,Oriented to Situation Alcohol / Substance Use: Not Applicable    Admission diagnosis:  Fall [W19.XXXA] Trochanteric fracture of left femur (HCC) [S72.102A] Fall, initial encounter [W19.XXXA] Closed nondisplaced intertrochanteric fracture of left femur, initial encounter (HCC) [S72.145A] Longstanding persistent atrial fibrillation (HCC) [I48.11] Patient Active Problem List   Diagnosis Date Noted  . Trochanteric fracture of left femur (HCC) 09/20/2020  . Fall at home, initial encounter 09/20/2020  . BPH (benign prostatic hyperplasia) 09/20/2020  . Mitral regurgitation   . COPD (chronic obstructive pulmonary disease) (HCC)   . Acute on chronic  congestive heart failure (HCC)   . Severe aortic stenosis   . Chronic diastolic CHF (congestive heart failure) (HCC) 07/15/2020  . COPD with acute exacerbation (HCC) 07/15/2020  . Atrial fibrillation, chronic (HCC)  07/15/2020   PCP:  Clovis Riley, L.August Saucer, MD Pharmacy:   [facility] [id number] - [place], [place] - [address] AT [facility] [address]  [place] [address] Phone: [phone] Fax: [phone]     Social Determinants of Health (SDOH) Interventions    Readmission Risk Interventions No flowsheet data found.

## 2020-09-21 NOTE — Anesthesia Postprocedure Evaluation (Signed)
Anesthesia Post Note  Patient: Ruben [name]  Procedure(s) Performed: INTRAMEDULLARY (IM) NAIL INTERTROCHANTRIC (Left )     Patient location during evaluation: PACU Anesthesia Type: General Level of consciousness: awake and alert Pain management: pain level controlled Vital Signs Assessment: post-procedure vital signs reviewed and stable Respiratory status: spontaneous breathing, nonlabored ventilation, respiratory function stable and patient connected to nasal cannula oxygen Cardiovascular status: blood pressure returned to baseline and stable Postop Assessment: no apparent nausea or vomiting Anesthetic complications: no   No complications documented.  Last Vitals:  Vitals:   09/21/20 0454 09/21/20 0600  BP: (!) 83/57 (!) 109/95  Pulse: 67 74  Resp: 16   Temp: 36.9 C   SpO2: (!) 88%     Last Pain:  Vitals:   09/21/20 0505  TempSrc:   PainSc: 4                  Velton Roselle S

## 2020-09-21 NOTE — Evaluation (Signed)
Physical Therapy Evaluation Patient Details Name: Ruben [name] MRN: [id number] DOB: [birth year] Today's Date: 09/21/2020   History of Present Illness  Ruben [name] is an 87 y.o. male.   HPI: Ruben [name] was going out to get in the car to come have a TAVR when he missed a step and fell. He had immediate left hip pain and could not get up. He was brought to the ED where x-ray showed a left hip fx and orthopedic surgery was consulted.Ruben [name] is an 87 y.o. male.   HPI: Ruben [name] was going out to get in the car to come have a TAVR when he missed a step and fell. He had immediate left hip pain and could not get up. He was brought to the ED where x-ray showed a left hip fx and orthopedic surgery was consulted. Pt. underwent IM nailing 09/21/20.Pt. PMN: AFIB, CHF, COPD, ON HOME O2.  Clinical Impression  PTA, patient lives with wife, son, and daughter in law and reports independence and caring for wife who recently had CVA. Patient required +2 assistance for OOB mobility with RW. Patient limited by pain this session. Patient presents with generalized weakness, decreased activity tolerance, impaired balance, and impaired functional mobility. Patient will benefit from skilled PT services during acute stay to address listed deficits. Recommend SNF following discharge to assist with return to independence.     Follow Up Recommendations SNF    Equipment Recommendations  None recommended by PT (patient owns necessary DME)    Recommendations for Other Services       Precautions / Restrictions Precautions Precautions: Fall Restrictions Weight Bearing Restrictions: Yes LLE Weight Bearing: Weight bearing as tolerated      Mobility  Bed Mobility Overal bed mobility: Needs Assistance Bed Mobility: Supine to Sit     Supine to sit: Mod assist     General bed mobility comments: with HOB elevated    Transfers Overall transfer level: Needs assistance Equipment used: Rolling Ruben [name] (2 wheeled) Transfers: Sit to/from  Stand Sit to Stand: Mod assist;+2 physical assistance;+2 safety/equipment Stand pivot transfers: Mod assist;+2 physical assistance;+2 safety/equipment          Ambulation/Gait Ambulation/Gait assistance: Mod assist;+2 physical assistance;+2 safety/equipment Gait Distance (Feet): 3 Feet Assistive device: Rolling Ruben [name] (2 wheeled) Gait Pattern/deviations: Step-to pattern;Decreased stride length;Decreased stance time - left;Decreased weight shift to left;Wide base of support Gait velocity: decreased   General Gait Details: steps towards recliner with modA+2 for balance. L knee buckling noted with WBing. Encouraged use of UEs to offweight L LE.  Stairs            Wheelchair Mobility    Modified Rankin (Stroke Patients Only)       Balance Overall balance assessment: Needs assistance Sitting-balance support: No upper extremity supported;Feet supported Sitting balance-Ruben [name]: Fair     Standing balance support: Bilateral upper extremity supported;During functional activity Standing balance-Ruben [name]: Poor Standing balance comment: reliant on UE support and external assist                             Pertinent Vitals/Pain Pain Assessment: 0-10 Pain Score: 5  Pain Location: L hip Pain Descriptors / Indicators: Sharp Pain Intervention(s): Premedicated before session;Monitored during session    Home Living Family/patient expects to be discharged to:: Private residence Living Arrangements: Children;Spouse/significant other Available Help at Discharge: Available 24 hours/day;Family;Personal care attendant Type of Home: House Home Access: Stairs to enter Entrance Stairs-Rails: None Entrance Stairs-Number of  Steps: 2 Home Layout: Two level;Able to live on main level with bedroom/bathroom Home Equipment: Bedside commode;Grab bars - tub/shower;Hand held shower head;Shower seat;Li Fragoso - 2 wheels;Cane - single point;Hospital bed Additional Comments: Living  at son's home.  Assists spouse after recent CVA    Prior Function Level of Independence: Independent         Comments: Pt drives, cares for his own meds and needs no assist with ADL, IADL.     Hand Dominance   Dominant Hand: Right    Extremity/Trunk Assessment   Upper Extremity Assessment Upper Extremity Assessment: Defer to OT evaluation RUE Deficits / Details: shoulder flex 80 and is limted by arthritis per pt.    Lower Extremity Assessment Lower Extremity Assessment: Generalized weakness;LLE deficits/detail LLE Deficits / Details: grossly 2/5 due to pain LLE: Unable to fully assess due to pain       Communication   Communication: HOH  Cognition Arousal/Alertness: Awake/alert Behavior During Therapy: WFL for tasks assessed/performed Overall Cognitive Status: Within Functional Limits for tasks assessed                                        General Comments General comments (skin integrity, edema, etc.): pt on 2L O2 [place] at baseline, spO2 WFL throughout    Exercises     Assessment/Plan    PT Assessment Patient needs continued PT services  PT Problem List Decreased strength;Decreased range of motion;Decreased activity tolerance;Decreased balance;Decreased mobility;Decreased knowledge of use of DME       PT Treatment Interventions DME instruction;Gait training;Functional mobility training;Therapeutic activities;Stair training;Therapeutic exercise;Balance training;Patient/family education    PT Goals (Current goals can be found in the Care Plan section)  Acute Rehab PT Goals Patient Stated Goal: to be able to take care of myself PT Goal Formulation: With patient/family Time For Goal Achievement: 10/05/20 Potential to Achieve Goals: Good    Frequency Min 3X/week   Barriers to discharge        Co-evaluation PT/OT/SLP Co-Evaluation/Treatment: Yes Reason for Co-Treatment: For patient/therapist safety;To address functional/ADL transfers PT  goals addressed during session: Mobility/safety with mobility;Balance;Proper use of DME         AM-PAC PT "6 Clicks" Mobility  Outcome Measure Help needed turning from your back to your side while in a flat bed without using bedrails?: A Lot Help needed moving from lying on your back to sitting on the side of a flat bed without using bedrails?: A Lot Help needed moving to and from a bed to a chair (including a wheelchair)?: A Lot Help needed standing up from a chair using your arms (e.g., wheelchair or bedside chair)?: A Lot Help needed to walk in hospital room?: A Lot Help needed climbing 3-5 steps with a railing? : A Lot 6 Click Score: 12    End of Session Equipment Utilized During Treatment: Gait belt;Oxygen Activity Tolerance: Patient limited by pain Patient left: in chair;with call bell/phone within reach;with chair alarm set Nurse Communication: Mobility status PT Visit Diagnosis: Unsteadiness on feet (R26.81);Muscle weakness (generalized) (M62.81);History of falling (Z91.81);Other abnormalities of gait and mobility (R26.89)    Time: 7829-5621 PT Time Calculation (min) (ACUTE ONLY): 25 min   Charges:   PT Evaluation $PT Eval Moderate Complexity: 1 Mod          Madsen Riddle A. Dan Humphreys PT, DPT Acute Rehabilitation Services Pager [phone] Office [phone]   Viviann Spare 09/21/2020, 1:38  PM   

## 2020-09-21 NOTE — Discharge Instructions (Signed)

## 2020-09-21 NOTE — Op Note (Signed)
DATE OF SURGERY:  09/21/2020  TIME: 8:10 AM  PATIENT NAME:  Ruben [name]  AGE: 87 y.o.  PRE-OPERATIVE DIAGNOSIS:  left hip fracture  POST-OPERATIVE DIAGNOSIS:  SAME  PROCEDURE:  INTRAMEDULLARY (IM) NAIL INTERTROCHANTRIC  SURGEON:  Sheral Apley  ASSISTANT:  Levester Fresh, PA-C, he was present and scrubbed throughout the case, critical for completion in a timely fashion, and for retraction, instrumentation, and closure.   OPERATIVE IMPLANTS: Stryker Gamma Nail  PREOPERATIVE INDICATIONS:  [name] is a 87 y.o. year old who fell and suffered a hip fracture. He was brought into the ER and then admitted and optimized and then elected for surgical intervention.    The risks benefits and alternatives were discussed with the patient including but not limited to the risks of nonoperative treatment, versus surgical intervention including infection, bleeding, nerve injury, malunion, nonunion, hardware prominence, hardware failure, need for hardware removal, blood clots, cardiopulmonary complications, morbidity, mortality, among others, and they were willing to proceed.    OPERATIVE PROCEDURE:  The patient was brought to the operating room and placed in the supine position. General anesthesia was administered. He was placed on the fracture table.  Closed reduction was performed under C-arm guidance. Time out was then performed after sterile prep and drape. He received preoperative antibiotics.  Incision was made proximal to the greater trochanter. A guidewire was placed in the appropriate position. Confirmation was made on AP and lateral views. The above-named nail was opened. I opened the proximal femur with a reamer. I then placed the nail by hand easily down. I did not need to ream the femur.  Once the nail was completely seated, I placed a guidepin into the femoral head into the center center position. I measured the length, and then reamed the lateral cortex and up into the head. I then  placed the lag screw. Slight compression was applied. Anatomic fixation achieved. Bone quality was mediocre.  I then secured the proximal interlocking bolt, and took off a half a turn, and then removed the instruments, and took final C-arm pictures AP and lateral the entire length of the leg.  I then used perfect circles technique to place a distal interlock screw.   Anatomic reconstruction was achieved, and the wounds were irrigated copiously and closed with Vicryl followed by staples and sterile gauze for the skin. The patient was awakened and returned to PACU in stable and satisfactory condition. There no complications and the patient tolerated the procedure well.  He will be weightbearing as tolerated, and will be on chemical px  for a period of four weeks after discharge.   Margarita Rana, M.D.

## 2020-09-21 NOTE — Plan of Care (Signed)

## 2020-09-22 DIAGNOSIS — I482 Chronic atrial fibrillation, unspecified: Secondary | ICD-10-CM

## 2020-09-22 LAB — CBC
HCT: 35.1 % — ABNORMAL LOW (ref 39.0–52.0)
Hemoglobin: 11 g/dL — ABNORMAL LOW (ref 13.0–17.0)
MCH: 32 pg (ref 26.0–34.0)
MCHC: 31.3 g/dL (ref 30.0–36.0)
MCV: 102 fL — ABNORMAL HIGH (ref 80.0–100.0)
Platelets: 147 10*3/uL — ABNORMAL LOW (ref 150–400)
RBC: 3.44 MIL/uL — ABNORMAL LOW (ref 4.22–5.81)
RDW: 15.1 % (ref 11.5–15.5)
WBC: 8.8 10*3/uL (ref 4.0–10.5)
nRBC: 0 % (ref 0.0–0.2)

## 2020-09-22 LAB — PROTIME-INR
INR: 1.3 — ABNORMAL HIGH (ref 0.8–1.2)
Prothrombin Time: 15.6 seconds — ABNORMAL HIGH (ref 11.4–15.2)

## 2020-09-22 LAB — RESP PANEL BY RT-PCR (FLU A&B, COVID) ARPGX2
Influenza A by PCR: NEGATIVE
Influenza B by PCR: NEGATIVE
SARS Coronavirus 2 by RT PCR: NEGATIVE

## 2020-09-22 MED ORDER — ACETAMINOPHEN 500 MG PO TABS
500.0000 mg | ORAL_TABLET | Freq: Four times a day (QID) | ORAL | 0 refills | Status: AC | PRN
Start: 2020-09-22 — End: 2020-10-22

## 2020-09-22 MED ORDER — HYDROCODONE-ACETAMINOPHEN 5-325 MG PO TABS: 1.0000 | ORAL_TABLET | Freq: Four times a day (QID) | ORAL | 0 refills | Status: AC | PRN

## 2020-09-22 MED ORDER — FLUTICASONE PROPIONATE 50 MCG/ACT NA SUSP
1.0000 | Freq: Every day | NASAL | Status: DC
  Administered 2020-09-22: 1 via NASAL
  Filled 2020-09-22: qty 16

## 2020-09-22 MED ORDER — ALPRAZOLAM 0.25 MG PO TABS: 0.2500 mg | ORAL_TABLET | Freq: Every evening | ORAL | 0 refills | Status: AC | PRN

## 2020-09-22 MED ORDER — LEVALBUTEROL HCL 0.63 MG/3ML IN NEBU
0.6300 mg | INHALATION_SOLUTION | Freq: Two times a day (BID) | RESPIRATORY_TRACT | Status: DC | PRN
  Filled 2020-09-22: qty 3

## 2020-09-22 MED ORDER — METHOCARBAMOL 500 MG PO TABS: 500.0000 mg | ORAL_TABLET | Freq: Four times a day (QID) | ORAL | Status: DC | PRN

## 2020-09-22 MED ORDER — LEVALBUTEROL HCL 0.63 MG/3ML IN NEBU
0.6300 mg | INHALATION_SOLUTION | Freq: Two times a day (BID) | RESPIRATORY_TRACT | Status: DC
  Administered 2020-09-22 – 2020-09-23 (×2): 0.63 mg via RESPIRATORY_TRACT
  Filled 2020-09-22 (×3): qty 3

## 2020-09-22 NOTE — Progress Notes (Signed)
Transition of Care [facility]) - CAGE-AID Screening   Patient Details  Name: Ruben [name] MRN: [id number] Date of Birth: [birth year]   Clinical Narrative:  Patient denies any current alcohol or drug use.  CAGE-AID Screening:    Have You Ever Felt You Ought to Cut Down on Your Drinking or Drug Use?: No Have People Annoyed You By Critizing Your Drinking Or Drug Use?: No Have You Felt Bad Or Guilty About Your Drinking Or Drug Use?: No Have You Ever Had a Drink or Used Drugs First Thing In The Morning to Steady Your Nerves or to Get Rid of a Hangover?: No CAGE-AID Score: 0  Substance Abuse Education Offered: No

## 2020-09-22 NOTE — Progress Notes (Signed)
   No apparently early cardiac complications from surgery. Restart home medications. Follow-up with multidisciplinary valve clinic after recovery for further TAVR evalution.  Will sign-off, please call with questions.  Chrystie Nose, MD, [facility], FACP    [facility]  Medical Director of the Advanced Lipid Disorders &  Cardiovascular Risk Reduction Clinic Diplomate of the American Board of Clinical Lipidology Attending Cardiologist  Direct Dial: [phone]  Fax: [phone]  Website:  [email or web address]

## 2020-09-22 NOTE — Progress Notes (Signed)
PROGRESS NOTE    Ruben [name]  PRF:[id number] DOB: [birth year] DOA: 09/20/2020 PCP: Clovis Riley, L.August Saucer, MD    Brief Narrative:  Patient is 87 year old gentleman with history of severe aortic stenosis, diastolic congestive heart failure, persistent A. fib on anticoagulation with Coumadin, COPD and chronic hypoxic failure on 2 L oxygen who was getting ready to go to TAVR in the morning, lost balance after overstepping on steps at home and fell on his left hip. In the emergency room hemodynamically stable.  INR 1.1.  X-ray with left intertrochanteric femur fracture.  Underwent ORIF.    Assessment & Plan:   Principal Problem:   Trochanteric fracture of left femur (HCC) Active Problems:   Chronic diastolic CHF (congestive heart failure) (HCC)   Atrial fibrillation, chronic (HCC)   Severe aortic stenosis   COPD (chronic obstructive pulmonary disease) (HCC)   Fall at home, initial encounter   BPH (benign prostatic hyperplasia)  Closed traumatic intertrochanteric fracture left hip: Status post ORIF with intramedullary nail left hip 2/15 Weightbearing as tolerated Pain control with Robaxin, Vicodin, local measures and ice compression working with PT OT.  Refer to skilled nursing facility for rehab. Coumadin resumed 2/16 for DVT prophylaxis. Postop dressing instructions and follow-up as per orthopedics.  COPD with chronic hypoxia: On 2 L oxygen at home.  We will continue.  Severe aortic stenosis: Was scheduled for TAVR on 2/15.  Seen by cardiology.  They will reschedule after rehab.  Chronic atrial fibrillation: Rate controlled.  On Coumadin.   DVT prophylaxis: SCDs Start: 09/20/20 2035 Place TED hose Start: 09/20/20 2035 SCDs Start: 09/20/20 1003 warfarin (COUMADIN) tablet 6 mg   Code Status: Full code Family Communication: Patient's son at the bedside. Disposition Plan: Status is: Inpatient  Remains inpatient appropriate because:Inpatient level of care appropriate due to severity  of illness   Dispo: The patient is from: Home              Anticipated d/c is to: SNF              Anticipated d/c date is: When bed available.              Patient currently is medically stable.   Difficult to place patient No         Consultants:   Orthopedics  Cardiology  Procedures:   Left intramedullary nail, Dr. Eulah Pont 2/15  Antimicrobials:   Perioperative   Subjective: Patient seen and examined.  Morning rounds, he was having severe pain on his left thigh, got some Vicodin and now sleepy.  Denies any other complaints. Objective: Vitals:   09/21/20 2035 09/22/20 0435 09/22/20 0500 09/22/20 0817  BP:  (!) 109/54  (!) 101/55  Pulse:  82  80  Resp:  16  20  Temp:  97.8 F (36.6 C)  97.7 F (36.5 C)  TempSrc:  Oral  Oral  SpO2: 93% (!) 81%  91%  Weight:   87.6 kg   Height:        Intake/Output Summary (Last 24 hours) at 09/22/2020 1143 Last data filed at 09/22/2020 0500 Gross per 24 hour  Intake 663.13 ml  Output 1200 ml  Net -536.87 ml   Filed Weights   09/21/20 0820 09/22/20 0500  Weight: 87.5 kg 87.6 kg    Examination:  General exam: Appears calm and comfortable  Appropriate for age. Respiratory system: Clear to auscultation. Respiratory effort normal. On 2 L oxygen.  Looks comfortable. Cardiovascular system: S1 & S2 heard, RRR.  Pansystolic murmur at aortic area.  Gastrointestinal system: Abdomen is nondistended, soft and nontender. No organomegaly or masses felt. Normal bowel sounds heard. Central nervous system: Alert and oriented. No focal neurological deficits. Extremities: Left lateral hip incision clean and dry.  Minimal swelling as anticipated from surgery.  Distal neurovascular status intact.    Data Reviewed: I have personally reviewed following labs and imaging studies  CBC: Recent Labs  Lab 09/16/20 1121 09/20/20 0803 09/21/20 0459 09/22/20 0143  WBC 7.0 7.7 5.4 8.8  NEUTROABS  --  6.2  --   --   HGB 13.0 12.6* 9.9*  11.0*  HCT 39.5 40.9 30.0* 35.1*  MCV 100.3* 103.3* 101.4* 102.0*  PLT 198 179 148* 147*   Basic Metabolic Panel: Recent Labs  Lab 09/16/20 1121 09/20/20 0803 09/21/20 0459 09/21/20 1114  NA 138 141 138  --   K 3.8 4.5 4.4  --   CL 99 105 104  --   CO2 29 27 26   --   GLUCOSE 94 132* 123*  --   BUN 25* 28* 26*  --   CREATININE 1.00 1.03 0.92  --   CALCIUM 8.9 9.0 8.4*  --   MG  --   --   --  1.9  PHOS  --   --   --  3.5   GFR: Estimated Creatinine Clearance: 54.2 mL/min (by C-G formula based on SCr of 0.92 mg/dL). Liver Function Tests: Recent Labs  Lab 09/16/20 1121 09/20/20 0803  AST 17 16  ALT 12 12  ALKPHOS 74 71  BILITOT 1.3* 1.4*  PROT 6.9 6.7  ALBUMIN 3.7 3.5   No results for input(s): LIPASE, AMYLASE in the last 168 hours. No results for input(s): AMMONIA in the last 168 hours. Coagulation Profile: Recent Labs  Lab 09/20/20 0803 09/21/20 0459 09/22/20 0143  INR 1.1 1.3* 1.3*   Cardiac Enzymes: No results for input(s): CKTOTAL, CKMB, CKMBINDEX, TROPONINI in the last 168 hours. BNP (last 3 results) No results for input(s): PROBNP in the last 8760 hours. HbA1C: No results for input(s): HGBA1C in the last 72 hours. CBG: No results for input(s): GLUCAP in the last 168 hours. Lipid Profile: No results for input(s): CHOL, HDL, LDLCALC, TRIG, CHOLHDL, LDLDIRECT in the last 72 hours. Thyroid Function Tests: No results for input(s): TSH, T4TOTAL, FREET4, T3FREE, THYROIDAB in the last 72 hours. Anemia Panel: Recent Labs    09/21/20 0459  VITAMINB12 348  FOLATE 8.9   Sepsis Labs: No results for input(s): PROCALCITON, LATICACIDVEN in the last 168 hours.  Recent Results (from the past 240 hour(s))  Surgical pcr screen     Status: None   Collection Time: 09/16/20 11:21 AM   Specimen: Nasal Mucosa; Nasal Swab  Result Value Ref Range Status   MRSA, PCR NEGATIVE NEGATIVE Final   Staphylococcus aureus NEGATIVE NEGATIVE Final    Comment: (NOTE) The Xpert  SA Assay (FDA approved for NASAL specimens in patients 57 years of age and older), is one component of a comprehensive surveillance program. It is not intended to diagnose infection nor to guide or monitor treatment. Performed at [facility], [address] [place], [place]   SARS CORONAVIRUS 2 (TAT 6-24 HRS) Nasopharyngeal Nasopharyngeal Swab     Status: None   Collection Time: 09/16/20 11:34 AM   Specimen: Nasopharyngeal Swab  Result Value Ref Range Status   SARS Coronavirus 2 NEGATIVE NEGATIVE Final    Comment: (NOTE) SARS-CoV-2 target nucleic acids are  NOT DETECTED.  The SARS-CoV-2 RNA is generally detectable in upper and lower respiratory specimens during the acute phase of infection. Negative results do not preclude SARS-CoV-2 infection, do not rule out co-infections with other pathogens, and should not be used as the sole basis for treatment or other patient management decisions. Negative results must be combined with clinical observations, patient history, and epidemiological information. The expected result is Negative.  Fact Sheet for Patients: HairSlick.no  Fact Sheet for Healthcare Providers: quierodirigir.com  This test is not yet approved or cleared by the [place] FDA and  has been authorized for detection and/or diagnosis of SARS-CoV-2 by FDA under an Emergency Use Authorization (EUA). This EUA will remain  in effect (meaning this test can be used) for the duration of the COVID-19 declaration under Se ction 564(b)(1) of the Act, 21 U.S.C. section 360bbb-3(b)(1), unless the authorization is terminated or revoked sooner.  Performed at [facility], [address] [place], [place] [address]   SARS CORONAVIRUS 2 (TAT 6-24 HRS) Nasopharyngeal Nasopharyngeal Swab     Status: None   Collection Time: 09/20/20  3:00 PM   Specimen: Nasopharyngeal Swab  Result Value Ref Range Status    SARS Coronavirus 2 NEGATIVE NEGATIVE Final    Comment: (NOTE) SARS-CoV-2 target nucleic acids are NOT DETECTED.  The SARS-CoV-2 RNA is generally detectable in upper and lower respiratory specimens during the acute phase of infection. Negative results do not preclude SARS-CoV-2 infection, do not rule out co-infections with other pathogens, and should not be used as the sole basis for treatment or other patient management decisions. Negative results must be combined with clinical observations, patient history, and epidemiological information. The expected result is Negative.  Fact Sheet for Patients: HairSlick.no  Fact Sheet for Healthcare Providers: quierodirigir.com  This test is not yet approved or cleared by the [place] FDA and  has been authorized for detection and/or diagnosis of SARS-CoV-2 by FDA under an Emergency Use Authorization (EUA). This EUA will remain  in effect (meaning this test can be used) for the duration of the COVID-19 declaration under Se ction 564(b)(1) of the Act, 21 U.S.C. section 360bbb-3(b)(1), unless the authorization is terminated or revoked sooner.  Performed at [facility], [address], [place], [place] [address]          Radiology Studies: DG C-Arm 1-60 Min  Result Date: 09/20/2020 CLINICAL DATA:  Left femur IM nail. EXAM: OPERATIVE LEFT HIP (WITH PELVIS IF PERFORMED) TECHNIQUE: Fluoroscopic spot image(s) were submitted for interpretation post-operatively. COMPARISON:  Preoperative radiograph. FINDINGS: Four fluoroscopic spot views of the left femur obtained in the operating room. Intramedullary nail with trans trochanteric and distal locking screws fixate intertrochanteric femur fracture. Total fluoroscopy time 1 minutes 38 seconds. IMPRESSION: Procedural fluoroscopy after left femur IM nail. Electronically Signed   By: Narda Rutherford M.D.   On: 09/20/2020 20:57   DG Hip  Port Unilat With Pelvis 1V Left  Result Date: 09/20/2020 CLINICAL DATA:  Status post IM nail. EXAM: DG HIP (WITH OR WITHOUT PELVIS) 1V PORT LEFT COMPARISON:  Preoperative radiographs earlier this day. FINDINGS: Intramedullary nail with trans trochanteric and distal screw fixation of intertrochanteric femur fracture. Improved fracture alignment from preoperative imaging. Lesser trochanteric fragment remains displaced. Knee arthroplasty partially included. Recent postsurgical change includes air and edema in the adjacent soft tissues. IMPRESSION: ORIF of intertrochanteric left femur fracture, in improved alignment from preoperative imaging. No immediate postoperative complication. Electronically Signed   By: Shawna Orleans  Sanford M.D.   On: 09/20/2020 19:12   DG HIP OPERATIVE UNILAT W OR W/O PELVIS LEFT  Result Date: 09/20/2020 CLINICAL DATA:  Left femur IM nail. EXAM: OPERATIVE LEFT HIP (WITH PELVIS IF PERFORMED) TECHNIQUE: Fluoroscopic spot image(s) were submitted for interpretation post-operatively. COMPARISON:  Preoperative radiograph. FINDINGS: Four fluoroscopic spot views of the left femur obtained in the operating room. Intramedullary nail with trans trochanteric and distal locking screws fixate intertrochanteric femur fracture. Total fluoroscopy time 1 minutes 38 seconds. IMPRESSION: Procedural fluoroscopy after left femur IM nail. Electronically Signed   By: Narda RutherfordMelanie  Sanford M.D.   On: 09/20/2020 20:57        Scheduled Meds: . docusate sodium  100 mg Oral BID  . dutasteride  0.5 mg Oral Daily  . feeding supplement  237 mL Oral BID BM  . fluticasone  1 spray Each Nare QHS  . mouth rinse  15 mL Mouth Rinse BID  . metoprolol succinate  50 mg Oral Daily  . montelukast  10 mg Oral Daily  . multivitamin with minerals  1 tablet Oral Daily  . senna  1 tablet Oral BID  . tamsulosin  0.8 mg Oral QHS  . torsemide  20 mg Oral Daily  . warfarin  6 mg Oral q1600  . Warfarin - Pharmacist Dosing  Inpatient   Does not apply q1600   Continuous Infusions: . sodium chloride 10 mL/hr at 09/21/20 1642     LOS: 2 days    Time spent: 30 minutes    Dorcas CarrowKuber Umar Patmon, MD [facility] Pager [phone]

## 2020-09-22 NOTE — Progress Notes (Signed)
    Subjective: Patient reports pain as mild. Well controlled with medicines. Tolerating diet. No CP, SOB. No weakness/dizziness.  Has mobilized with PT/OT. Reports he tries to practice lifting his left leg off the bed while he is laying down.   Objective:   VITALS:   Vitals:   09/21/20 1950 09/21/20 2035 09/22/20 0435 09/22/20 0500  BP: 97/78  (!) 109/54   Pulse: 86  82   Resp: 14  16   Temp: 98.1 F (36.7 C)  97.8 F (36.6 C)   TempSrc: Oral  Oral   SpO2: 91% 93% (!) 81%   Weight:    87.6 kg  Height:       CBC Latest Ref Rng & Units 09/22/2020 09/21/2020 09/20/2020  WBC 4.0 - 10.5 K/uL 8.8 5.4 7.7  Hemoglobin 13.0 - 17.0 g/dL 11.0(L) 9.9(L) 12.6(L)  Hematocrit 39.0 - 52.0 % 35.1(L) 30.0(L) 40.9  Platelets 150 - 400 K/uL 147(L) 148(L) 179   BMP Latest Ref Rng & Units 09/21/2020 09/20/2020 09/16/2020  Glucose 70 - 99 mg/dL 992(E) 268(T) 94  BUN 8 - 23 mg/dL 41(D) 62(I) 29(N)  Creatinine 0.61 - 1.24 mg/dL 9.89 2.11 9.41  BUN/Creat Ratio 10 - 24 - - -  Sodium 135 - 145 mmol/L 138 141 138  Potassium 3.5 - 5.1 mmol/L 4.4 4.5 3.8  Chloride 98 - 111 mmol/L 104 105 99  CO2 22 - 32 mmol/L 26 27 29   Calcium 8.9 - 10.3 mg/dL ) 9.0 8.9   Intake/Output      02/16 0701 02/17 0700 02/17 0701 02/18 0700   P.O. 717    I.V. (mL/kg) 186.1 (2.1)    IV Piggyback     Total Intake(mL/kg) 903.1 (10.3)    Urine (mL/kg/hr) 1200 (0.6)    Blood     Total Output 1200    Net -296.9             Physical Exam: General: NAD.  Laying in bed. Hungry. Calm, conversant.  No increased work of breathing. MSK Neurovascularly intact Sensation intact distally Intact pulses distally Dorsiflexion/Plantar flexion intact Able to slightly lift left leg up off the bed Incision: dressings C/D/I   Assessment: 2 Days Post-Op  S/P Procedure(s) (LRB): INTRAMEDULLARY (IM) NAIL INTERTROCHANTRIC (Left) by Dr. 3/18. Jewel Baize on 09/20/20  Principal Problem:   Trochanteric fracture of left femur  (HCC) Active Problems:   Chronic diastolic CHF (congestive heart failure) (HCC)   Atrial fibrillation, chronic (HCC)   Severe aortic stenosis   COPD (chronic obstructive pulmonary disease) (HCC)   Fall at home, initial encounter   BPH (benign prostatic hyperplasia)   Closed left intertrochanteric femur fracture, status post IM nail Doing well postop day 2 Tolerating diet and voiding Pain controlled Has mobilized OOB with PT  Plan: Up with therapy Incentive Spirometry Apply ice PRN  Weightbearing: WBAT LLE Insicional and dressing care: Dressings left intact until follow-up. Reinforce as needed. Showering: Keep dressing dry VTE prophylaxis: Coumadin, SCDs, ambulation Pain control: Minimize narcotics.  Continue current regimen. Follow - up plan: 2 weeks Contact information:  09/22/20 MD, Margarita Rana PA-C  Dispo: SNF. Waiting on bed placement. Ok to discharge when ready medically from an ortho standpoint.  Levester Fresh, PA-C 09/22/2020, 7:52 AM

## 2020-09-22 NOTE — TOC Progression Note (Signed)
Transition of Care [facility]) - Progression Note    Patient Details  Name: Ruben [name] MRN: [id number] Date of Birth: [birth year]  Transition [facility]) CM/SW Contact  Epifanio Lesches, RN Phone Number: 09/22/2020, 3:42 PM  Clinical Narrative:    [facility] extended SNF bed offer and was accepted by son. Per [facility] admissions bed will be available 09/23/2020, MD and nurse made aware. Updated Covid needed.  TOC team will continue to monitor and assist with needs....    Expected Discharge Plan: Skilled Nursing Facility Barriers to Discharge: No SNF bed  Expected Discharge Plan and Services Expected Discharge Plan: Skilled Nursing Facility   Discharge Planning Services: CM Consult   Living arrangements for the past 2 months: Single Family Home Expected Discharge Date: 09/20/20                                     Social Determinants of Health (SDOH) Interventions    Readmission Risk Interventions No flowsheet data found.

## 2020-09-22 NOTE — Plan of Care (Signed)
  Problem: Education: Goal: Knowledge of General Education information will improve Description Including pain rating scale, medication(s)/side effects and non-pharmacologic comfort measures Outcome: Progressing   Problem: Clinical Measurements: Goal: Respiratory complications will improve Outcome: Progressing   Problem: Activity: Goal: Risk for activity intolerance will decrease Outcome: Progressing   Problem: Nutrition: Goal: Adequate nutrition will be maintained Outcome: Progressing   Problem: Coping: Goal: Level of anxiety will decrease Outcome: Progressing   Problem: Pain Managment: Goal: General experience of comfort will improve Outcome: Progressing   Problem: Safety: Goal: Ability to remain free from injury will improve Outcome: Progressing   

## 2020-09-22 NOTE — Progress Notes (Signed)
ANTICOAGULATION CONSULT NOTE  Pharmacy Consult for Warfarin Indication: atrial fibrillation  No Known Allergies  Patient Measurements: Height: 5\' 6"  (167.6 cm) Weight: 87.6 kg (193 lb 2 oz) IBW/kg (Calculated) : 63.8  Vital Signs: Temp: 97.8 F (36.6 C) (02/17 0435) Temp Source: Oral (02/17 0435) BP: 109/54 (02/17 0435) Pulse Rate: 82 (02/17 0435)  Labs: Recent Labs    09/20/20 0803 09/21/20 0459 09/22/20 0143  HGB 12.6* 9.9* 11.0*  HCT 40.9 30.0* 35.1*  PLT 179 148* 147*  LABPROT 13.6 15.4* 15.6*  INR 1.1 1.3* 1.3*  CREATININE 1.03 0.92  --     Estimated Creatinine Clearance: 54.2 mL/min (by C-G formula based on SCr of 0.92 mg/dL).   Assessment: 87 yo M with persistent atrial fibrillation on warfarin PTA. Was scheduled for TAVR on 2/15 but had a mechanical fall on his way to the hospital resulting in femur fracture requiring L hip surgery on 09/20/20.  Warfarin has been on hold in anticipation for TAVR, with last dose on 2/11.  Pharmacy asked to restart warfarin on 2/16.  INR sub-therapeutic at 1.3 as expected.  No bleeding reported.  Goal of Therapy:  INR 2-3 Monitor platelets by anticoagulation protocol: Yes   Plan:  Continue home Coumadin regimen 6mg  PO daily Daily PT / INR  Lucella Pommier D. , PharmD, BCPS, BCCCP 09/22/2020, 7:41 AM

## 2020-09-22 NOTE — Progress Notes (Signed)
Physical Therapy Treatment Patient Details Name: Ruben [name] MRN: [id number] DOB: 25-Dec-1926 Today's Date: 09/22/2020    History of Present Illness 87 y.o. male with a hx of chronic diastolic CHF, RBBB, mitral regurgitation, HTN, COPD on home 02, longstanding persistent atrial fibrillation on warfarin, degenerative arthritis and aortic stenosis presented to ED following fall getting into car as he was heading to hospital for transcatheter aortic valve replacement. Patient sustained L femur fx. Patient s/p IMN L femur on 2/15.    PT Comments    Patient limited by pain this session. Patient able to perform bed mobility with modA and sit to stand transfer x 3 with maxA. Patient with increased fear of falling due to cause of injury. SNF remains appropriate as d/c plan to assist with return to PLOF.    Follow Up Recommendations  SNF     Equipment Recommendations  None recommended by PT (patient owns necessary DME)    Recommendations for Other Services       Precautions / Restrictions Precautions Precautions: Fall Restrictions Weight Bearing Restrictions: Yes LLE Weight Bearing: Weight bearing as tolerated    Mobility  Bed Mobility Overal bed mobility: Needs Assistance Bed Mobility: Supine to Sit;Sit to Supine     Supine to sit: Mod assist Sit to supine: Mod assist   General bed mobility comments: modA for management of L LE    Transfers Overall transfer level: Needs assistance Equipment used: Rolling Madisin Hasan (2 wheeled) Transfers: Sit to/from Stand Sit to Stand: Max assist         General transfer comment: maxA to stand from elevated surface x 3. Increased pain with WBing, requesting to sit back down. Motivated however limited by fear and pain  Ambulation/Gait             General Gait Details: patient declined due to pain   Stairs             Wheelchair Mobility    Modified Rankin (Stroke Patients Only)       Balance Overall balance assessment:  Needs assistance Sitting-balance support: No upper extremity supported;Feet supported Sitting balance-Leahy Scale: Fair     Standing balance support: Bilateral upper extremity supported;During functional activity Standing balance-Leahy Scale: Poor Standing balance comment: reliant on UE support and external assist                            Cognition Arousal/Alertness: Awake/alert Behavior During Therapy: WFL for tasks assessed/performed Overall Cognitive Status: Within Functional Limits for tasks assessed                                        Exercises General Exercises - Lower Extremity Ankle Circles/Pumps: Both;10 reps Quad Sets: Both;10 reps Long Arc Quad: AROM;Right;AAROM;Left;10 reps;Seated Hip ABduction/ADduction: AAROM;Left;AROM;Right;10 reps;Supine Hip Flexion/Marching: AAROM;Left;AROM;Right;10 reps;Seated    General Comments        Pertinent Vitals/Pain Pain Assessment: Faces Faces Pain Scale: Hurts even more Pain Location: L hip Pain Descriptors / Indicators: Sharp;Stabbing Pain Intervention(s): Monitored during session;Repositioned    Home Living                      Prior Function            PT Goals (current goals can now be found in the care plan section) Acute Rehab PT Goals Patient Stated Goal:  to be able to take care of myself PT Goal Formulation: With patient/family Time For Goal Achievement: 10/05/20 Potential to Achieve Goals: Good Progress towards PT goals: Not progressing toward goals - comment (limited by pain)    Frequency    Min 3X/week      PT Plan Current plan remains appropriate    Co-evaluation              AM-PAC PT "6 Clicks" Mobility   Outcome Measure  Help needed turning from your back to your side while in a flat bed without using bedrails?: A Lot Help needed moving from lying on your back to sitting on the side of a flat bed without using bedrails?: A Lot Help needed  moving to and from a bed to a chair (including a wheelchair)?: A Lot Help needed standing up from a chair using your arms (e.g., wheelchair or bedside chair)?: A Lot Help needed to walk in hospital room?: A Lot Help needed climbing 3-5 steps with a railing? : A Lot 6 Click Score: 12    End of Session Equipment Utilized During Treatment: Gait belt;Oxygen Activity Tolerance: Patient limited by pain Patient left: in bed;with bed alarm set;with call bell/phone within reach Nurse Communication: Mobility status PT Visit Diagnosis: Unsteadiness on feet (R26.81);Muscle weakness (generalized) (M62.81);History of falling (Z91.81);Other abnormalities of gait and mobility (R26.89)     Time: 6812-7517 PT Time Calculation (min) (ACUTE ONLY): 24 min  Charges:  $Therapeutic Exercise: 8-22 mins $Therapeutic Activity: 8-22 mins                     Ravon Mortellaro A. Dan Humphreys PT, DPT Acute Rehabilitation Services Pager [phone] Office [phone]    Viviann Spare 09/22/2020, 5:21 PM

## 2020-09-23 ENCOUNTER — Encounter (HOSPITAL_BASED_OUTPATIENT_CLINIC_OR_DEPARTMENT_OTHER): Payer: Self-pay | Admitting: Orthopedic Surgery

## 2020-09-23 DIAGNOSIS — J431 Panlobular emphysema: Secondary | ICD-10-CM

## 2020-09-23 LAB — CBC
HCT: 30 % — ABNORMAL LOW (ref 39.0–52.0)
Hemoglobin: 9.7 g/dL — ABNORMAL LOW (ref 13.0–17.0)
MCH: 32.2 pg (ref 26.0–34.0)
MCHC: 32.3 g/dL (ref 30.0–36.0)
MCV: 99.7 fL (ref 80.0–100.0)
Platelets: 140 10*3/uL — ABNORMAL LOW (ref 150–400)
RBC: 3.01 MIL/uL — ABNORMAL LOW (ref 4.22–5.81)
RDW: 15 % (ref 11.5–15.5)
WBC: 9.8 10*3/uL (ref 4.0–10.5)
nRBC: 1 % — ABNORMAL HIGH (ref 0.0–0.2)

## 2020-09-23 LAB — PROTIME-INR
INR: 1.7 — ABNORMAL HIGH (ref 0.8–1.2)
Prothrombin Time: 19.7 seconds — ABNORMAL HIGH (ref 11.4–15.2)

## 2020-09-23 NOTE — Progress Notes (Signed)
RN called report to [place] at [facility]. All questions answered. Pt belongings gathered to be sent with him. Pt waiting on PTAR for discharge.

## 2020-09-23 NOTE — TOC Transition Note (Signed)
Transition of Care [facility]) - CM/SW Discharge Note   Patient Details  Name: Ruben [name] MRN: [id number] Date of Birth: 1927-05-15  Transition of Care [facility]) CM/SW Contact:  Carley Hammed, LCSWA Phone Number: 09/23/2020, 11:22 AM   Clinical Narrative:    Nurse to call report to [phone] Rm# [phone]   Final next level of care: Skilled Nursing Facility Barriers to Discharge: Barriers Resolved   Patient Goals and CMS Choice     Choice offered to / list presented to : Adult Children  Discharge Placement              Patient chooses bed at: [facility] Patient to be transferred to facility by: PTAR Name of family member notified: [place] Ouch Patient and family notified of of transfer: 09/23/20  Discharge Plan and Services   Discharge Planning Services: CM Consult                                 Social Determinants of Health (SDOH) Interventions     Readmission Risk Interventions No flowsheet data found.

## 2020-09-23 NOTE — Progress Notes (Signed)
Subjective: Patient reports pain has increased yesterday and today. He is worried about this. Says it is somewhat controlled with medicines. Tolerating diet. No CP, SOB. No weakness/dizziness.  Has mobilized with PT/OT. Says he is ready to get up and moving and get some fresh air.    Objective:   VITALS:   Vitals:   09/22/20 1700 09/22/20 1953 09/22/20 2026 09/23/20 0434  BP: (!) 90/56 (!) 95/58  117/68  Pulse:  88 86 60  Resp: 18 19 18 17   Temp: 98.7 F (37.1 C) 98.2 F (36.8 C)  98 F (36.7 C)  TempSrc: Oral Oral  Oral  SpO2: 97% 95% 93% 91%  Weight:      Height:       CBC Latest Ref Rng & Units 09/23/2020 09/22/2020 09/21/2020  WBC 4.0 - 10.5 K/uL 9.8 8.8 5.4  Hemoglobin 13.0 - 17.0 g/dL 09/23/2020) 11.0(L) 9.9(L)  Hematocrit 39.0 - 52.0 % 30.0(L) 35.1(L) 30.0(L)  Platelets 150 - 400 K/uL 140(L) 147(L) 148(L)   BMP Latest Ref Rng & Units 09/21/2020 09/20/2020 09/16/2020  Glucose 70 - 99 mg/dL 11/14/2020) 295(M) 94  BUN 8 - 23 mg/dL 841(L) 24(M) 01(U)  Creatinine 0.61 - 1.24 mg/dL 27(O 5.36 6.44  BUN/Creat Ratio 10 - 24 - - -  Sodium 135 - 145 mmol/L 138 141 138  Potassium 3.5 - 5.1 mmol/L 4.4 4.5 3.8  Chloride 98 - 111 mmol/L 104 105 99  CO2 22 - 32 mmol/L 26 27 29   Calcium 8.9 - 10.3 mg/dL 0.34) 9.0 8.9   Intake/Output      02/17 0701 02/18 0700 02/18 0701 02/19 0700   P.O.     I.V. (mL/kg)     Total Intake(mL/kg)     Urine (mL/kg/hr) 500 (0.2)    Stool 0    Total Output 500    Net -500             Physical Exam: General: NAD. Laying in bed. Seems a bit anxious but reports he has soiled himself and called for help but no one has come to clean him up yet. Otherwise conversant. MSK Neurovascularly intact Sensation intact distally Intact pulses distally Dorsiflexion/Plantar flexion intact Able to slightly lift left leg up off the bed but somewhat limited today d/t pain Incision: dressings C/D/I   Assessment: 3 Days Post-Op  S/P Procedure(s)  (LRB): INTRAMEDULLARY (IM) NAIL INTERTROCHANTRIC (Left) by Dr. 3/18. 3/19 on 09/20/20  Principal Problem:   Trochanteric fracture of left femur (HCC) Active Problems:   Chronic diastolic CHF (congestive heart failure) (HCC)   Atrial fibrillation, chronic (HCC)   Severe aortic stenosis   COPD (chronic obstructive pulmonary disease) (HCC)   Fall at home, initial encounter   BPH (benign prostatic hyperplasia)   Closed left intertrochanteric femur fracture, status post IM nail Doing fairly well postop day 3 Tolerating diet and voiding Pain not as well controlled today Has mobilized OOB with PT  Plan: Up with therapy Incentive Spirometry Apply ice PRN  Weightbearing: WBAT LLE Insicional and dressing care: Dressings left intact until follow-up. Reinforce as needed. Showering: Keep dressing dry VTE prophylaxis: Coumadin, SCDs, ambulation Pain control: Use the PRN medicines more as reports show he is able to receive more than he has been getting particularly Tylenol Follow - up plan: Call to make an appt to see Dr. Eulah Pont in 2 weeks in the office Dispo: SNF. Received bed placement at [facility]. Likely to be discharged there today. Contact information:  Margarita Rana MD, [facility] PA-C [phone]    Jonte Wollam Leveda Anna, PA-C 09/23/2020, 8:07 AM

## 2020-09-23 NOTE — Plan of Care (Signed)
  Problem: Health Behavior/Discharge Planning: Goal: Ability to manage health-related needs will improve Outcome: Adequate for Discharge   Problem: Activity: Goal: Risk for activity intolerance will decrease Outcome: Adequate for Discharge   

## 2020-09-23 NOTE — Progress Notes (Signed)
ANTICOAGULATION CONSULT NOTE  Pharmacy Consult for Warfarin Indication: atrial fibrillation  No Known Allergies  Patient Measurements: Height: 5\' 6"  (167.6 cm) Weight: 87.6 kg (193 lb 2 oz) IBW/kg (Calculated) : 63.8  Vital Signs: Temp: 98 F (36.7 C) (02/18 0434) Temp Source: Oral (02/18 0434) BP: 117/68 (02/18 0434) Pulse Rate: 60 (02/18 0434)  Labs: Recent Labs    09/20/20 0803 09/21/20 0459 09/22/20 0143 09/23/20 0144  HGB 12.6* 9.9* 11.0* 9.7*  HCT 40.9 30.0* 35.1* 30.0*  PLT 179 148* 147* 140*  LABPROT 13.6 15.4* 15.6* 19.7*  INR 1.1 1.3* 1.3* 1.7*  CREATININE 1.03 0.92  --   --     Estimated Creatinine Clearance: 54.2 mL/min (by C-G formula based on SCr of 0.92 mg/dL).   Assessment: 87 yo M with persistent atrial fibrillation on warfarin PTA. Was scheduled for TAVR on 2/15 but had a mechanical fall on his way to the hospital resulting in femur fracture requiring L hip surgery on 09/20/20.  Warfarin has been on hold in anticipation for TAVR, with last dose on 2/11.  Pharmacy asked to restart warfarin on 2/16.  INR sub-therapeutic but trending up.  No bleeding reported.  Goal of Therapy:  INR 2-3 Monitor platelets by anticoagulation protocol: Yes   Plan:  Continue home Coumadin regimen 6mg  PO daily Daily PT / INR  Sivan Quast D. , PharmD, BCPS, BCCCP 09/23/2020, 7:51 AM

## 2020-09-23 NOTE — Discharge Summary (Signed)
Physician Discharge Summary  Ruben [name] W[facility]:[id number] DOB: [birth year] DOA: 09/20/2020  PCP: Alroy Dust, L.Marlou Sa, MD  Admit date: 09/20/2020 Discharge date: 09/23/2020  Admitted From: Home Disposition: Skilled nursing facility  Recommendations for Outpatient Follow-up:  1. Follow up with PCP in 1-2 weeks 2. Cardiology will schedule follow-up 3. Orthopedics to schedule follow-up for revisit  Home Health: Not applicable Equipment/Devices: Not applicable, on 2 L oxygen at home  Discharge Condition: Stable CODE STATUS: Full code Diet recommendation: Low-salt diet  Discharge summary: Patient is 87 year old gentleman with history of severe aortic stenosis, diastolic congestive heart failure, persistent A. fib on anticoagulation with Coumadin, COPD and chronic hypoxic failure on 2 L oxygen who was getting ready to go to TAVR in the morning, lost balance after overstepping on steps at home and fell on his left hip. In the emergency room hemodynamically stable.  INR 1.1.  X-ray with left intertrochanteric femur fracture.  Underwent ORIF.    Assessment & Plan of care:   # Closed traumatic intertrochanteric fracture left hip: Status post ORIF with intramedullary nail left hip 2/15 Weightbearing as tolerated Pain control with Robaxin, Vicodin, local measures and ice compression working with PT OT.   Coumadin resumed 2/16 for DVT prophylaxis. Surgically stable as per surgery.  The only scheduled follow-up.  # COPD with chronic hypoxia: On 2 L oxygen at home.  We will continue.  On bronchodilator therapy that he will continue.  # Severe aortic stenosis: Was scheduled for TAVR on 2/15.  Seen by cardiology.  They will reschedule after rehab.  # Chronic atrial fibrillation: Rate controlled.  On Coumadin.  Patient is medically stabilized.  He will undergo inpatient therapies at a skilled nursing facility before returning home.  Stable for transfer.    Discharge Diagnoses:  Principal  Problem:   Trochanteric fracture of left femur (HCC) Active Problems:   Chronic diastolic CHF (congestive heart failure) (HCC)   Atrial fibrillation, chronic (HCC)   Severe aortic stenosis   COPD (chronic obstructive pulmonary disease) ([place])   Fall at home, initial encounter   BPH (benign prostatic hyperplasia)    Discharge Instructions  Discharge Instructions    Call MD / Call 911   Complete by: As directed    If you experience chest pain or shortness of breath, CALL 911 and be transported to the hospital emergency room.  If you develope a fever above 101 F, pus (white drainage) or increased drainage or redness at the wound, or calf pain, call your surgeon's office.   Call MD for:  redness, tenderness, or signs of infection (pain, swelling, redness, odor or green/yellow discharge around incision site)   Complete by: As directed    Call MD for:  severe uncontrolled pain   Complete by: As directed    Call MD for:  temperature >100.4   Complete by: As directed    Diet - low sodium heart healthy   Complete by: As directed    Discharge instructions   Complete by: As directed    Diet: As you were doing prior to hospitalization   Shower:  May shower but keep the wounds dry, use an occlusive plastic wrap, NO SOAKING IN TUB.  If the bandage gets wet, change with a clean dry gauze.  If you have a splint on, leave the splint in place and keep the splint dry with a plastic bag.  Dressing:  Keep dressings on and dry until follow up.  Activity:  Increase activity slowly as tolerated, but follow  the weight bearing instructions below.  The rules on driving is that you can not be taking narcotics while you drive, and you must feel in control of the vehicle.    Weight Bearing:   As tolerated .    To prevent constipation: you may use a stool softener such as -  Colace (over the counter) 100 mg by mouth twice a day  Drink plenty of fluids (prune juice may be helpful) and high fiber  foods Miralax (over the counter) for constipation as needed.    Itching:  If you experience itching with your medications, try taking only a single pain pill, or even half a pain pill at a time.  You can also use benadryl over the counter for itching or also to help with sleep.   Precautions:  If you experience chest pain or shortness of breath - call 911 immediately for transfer to the hospital emergency department!!  If you develop a fever greater that 101 F, purulent drainage from wound, increased redness or drainage from wound, or calf pain -- Call the office at [phone]                                                 Follow- Up Appointment:  Please call for an appointment to be seen in 1-2 weeks [place] - [phone]   Increase activity slowly   Complete by: As directed    No dressing needed   Complete by: As directed      Allergies as of 09/23/2020   No Known Allergies     Medication List    STOP taking these medications   HYDROcodone-acetaminophen 7.5-325 MG tablet Commonly known as: NORCO Replaced by: HYDROcodone-acetaminophen 5-325 MG tablet     TAKE these medications   acetaminophen 500 MG tablet Commonly known as: TYLENOL Take 1 tablet (500 mg total) by mouth every 6 (six) hours as needed for mild pain or moderate pain. What changed: reasons to take this   ALPRAZolam 0.25 MG tablet Commonly known as: XANAX Take 1 tablet (0.25 mg total) by mouth at bedtime as needed for up to 5 days for anxiety or sleep (max 2 doses).   diclofenac Sodium 1 % Gel Commonly known as: VOLTAREN Apply 1 application topically 4 (four) times daily as needed (for pain).   docusate sodium 100 MG capsule Commonly known as: COLACE Take 1 capsule (100 mg total) by mouth 2 (two) times daily. What changed:   when to take this  reasons to take this   dutasteride 0.5 MG capsule Commonly known as: AVODART Take 0.5 mg by mouth daily.   Eyescrub Pads Place 1 each into both eyes  daily as needed (dry eye).   guaiFENesin 600 MG 12 hr tablet Commonly known as: MUCINEX Take 600 mg by mouth daily as needed for to loosen phlegm.   HYDROcodone-acetaminophen 5-325 MG tablet Commonly known as: NORCO/VICODIN Take 1 tablet by mouth every 6 (six) hours as needed for up to 5 days for severe pain. Replaces: HYDROcodone-acetaminophen 7.5-325 MG tablet   levalbuterol 0.63 MG/3ML nebulizer solution Commonly known as: XOPENEX Take 0.63 mg by nebulization 2 (two) times daily.   levalbuterol 45 MCG/ACT inhaler Commonly known as: XOPENEX HFA Inhale 2 puffs into the lungs every 4 (four) hours as needed for wheezing or shortness of breath.   METAMUCIL FIBER  PO Take 11.6 g by mouth daily as needed (Constipation).   methocarbamol 500 MG tablet Commonly known as: ROBAXIN Take 1 tablet (500 mg total) by mouth every 6 (six) hours as needed for muscle spasms.   metoprolol succinate 50 MG 24 hr tablet Commonly known as: TOPROL-XL Take 50 mg by mouth daily.   montelukast 10 MG tablet Commonly known as: SINGULAIR Take 10 mg by mouth daily.   polyethylene glycol 17 g packet Commonly known as: MIRALAX / GLYCOLAX Take 17 g by mouth daily as needed for mild constipation.   potassium chloride 8 MEQ tablet Commonly known as: KLOR-CON Take 8 mEq by mouth daily.   REFRESH DRY EYE THERAPY OP Place 1 drop into both eyes daily as needed (Dry eyes).   Salonpas 3.08-11-08 % Ptch Generic drug: Camphor-Menthol-Methyl Sal Place 1 patch onto the skin daily as needed (muscle aches/pain.).   tamsulosin 0.4 MG Caps capsule Commonly known as: FLOMAX Take 0.8 mg by mouth at bedtime.   tizanidine 2 MG capsule Commonly known as: ZANAFLEX Take 2 mg by mouth at bedtime as needed for muscle spasms.   torsemide 20 MG tablet Commonly known as: DEMADEX Take 1 tablet (20 mg total) by mouth daily.   warfarin 6 MG tablet Commonly known as: COUMADIN Take 6 mg by mouth at bedtime.             Discharge Care Instructions  (From admission, onward)         Start     Ordered   09/22/20 0000  No dressing needed        09/22/20 1518          Contact information for after-discharge care    Destination    [facility] .   Service: Skilled Nursing Contact information: [place] [phone]                 No Known Allergies  Consultations:  Orthopedics  Cardiology   Procedures/Studies: DG Chest 2 View  Result Date: 09/16/2020 CLINICAL DATA:  Pre-admit, TAVR, shortness of breath EXAM: CHEST - 2 VIEW COMPARISON:  08/16/2020 FINDINGS: Cardiomegaly. Unchanged small bilateral pleural effusions. Disc degenerative disease of the thoracic spine. IMPRESSION: Cardiomegaly and unchanged small bilateral pleural effusions. No new airspace opacity. Electronically Signed   By: Eddie Candle M.D.   On: 09/16/2020 16:35   DG Pelvis 1-2 Views  Result Date: 09/20/2020 CLINICAL DATA:  Fall this morning.  Left hip pain EXAM: PELVIS - 1-2 VIEW COMPARISON:  None. FINDINGS: Acute intertrochanteric left femur fracture with medial impaction. No evidence of pelvic ring fracture or diastasis. Osteopenia and atherosclerosis. IMPRESSION: Acute intertrochanteric left femur fracture. Electronically Signed   By: Monte Fantasia M.D.   On: 09/20/2020 09:05   CARDIAC CATHETERIZATION  Result Date: 08/24/2020  Prox RCA lesion is 40% stenosed.  RPDA lesion is 60% stenosed.  1st RPL lesion is 50% stenosed.  Mid LM to Dist LM lesion is 30% stenosed.  Prox LAD to Mid LAD lesion is 50% stenosed.  1st Diag lesion is 50% stenosed.  1.  Diffuse calcific nonobstructive CAD 2.  Severe calcific aortic stenosis with mean gradient 37 mmHg, aortic valve area 1.05 cm 3.  Mild pulmonary hypertension Recommendation: Continue multidisciplinary team evaluation for treatment of severe symptomatic aortic stenosis   CT CORONARY MORPH W/CTA COR W/SCORE W/CA  W/CM &/OR WO/CM  Addendum Date: 08/30/2020   ADDENDUM REPORT: 08/30/2020 18:37 ADDENDUM: On review  of the MV, there is flail P2-P1 segment. Echocardiogram reviewed with splay artifact and MR that was shielded due to significant MAC. Eleonore Chiquito, MD Electronically Signed   By: Eleonore Chiquito   On: 08/30/2020 18:37   Addendum Date: 08/30/2020   ADDENDUM REPORT: 08/30/2020 18:12 CLINICAL DATA:  Severe Aortic Stenosis. EXAM: Cardiac TAVR CT TECHNIQUE: The patient was scanned on a [employer]. A 100 kV retrospective scan was triggered in the descending thoracic aorta at 111 HU's. Gantry rotation speed was 250 msecs and collimation was .6 mm. No beta blockade or nitro were given. The 3D data set was reconstructed in 5% intervals of the R-R cycle. Systolic and diastolic phases were analyzed on a dedicated work station using MPR, MIP and VRT modes. The patient received 80 cc of contrast. FINDINGS: Image quality: Average.  The entire left ventricle was not imaged. Noise artifact is: Moderate cardiac motion artifact. Millisecond reconstruction performed. Valve Morphology: The aortic valve is tricuspid with diffuse severe calcifications. The leaflets are severe restricted in systole consistent with severe aortic stenosis. Aortic Valve Calcium score: 4577 Aortic annular dimension: Phase assessed: 200 ms Annular area: 448 mm2 Annular perimeter: 75.9 mm Max diameter: 26.8 mm [name] diameter: 21.9 mm Annular and subannular calcification: No annular calcification. There is minimal subannular calcification under the [place]. Optimal coplanar projection: LAO 27 CAU 4 Coronary Artery Height above Annulus: Left Main: 12.6 mm Right Coronary: 17.0 mm Sinus of Valsalva Measurements: Non-coronary: 30 mm Right-coronary: 31 mm Left-coronary: 31 mm Sinus of Valsalva Height: Non-coronary: 23.0 mm Right-coronary: 21.3 mm Left-coronary: 19.8 mm Sinotubular Junction: 28 mm Ascending Thoracic Aorta: 34 mm Coronary Arteries: Normal  coronary origin. Right dominance. The study was performed without use of NTG and is insufficient for plaque evaluation. Please refer to recent cardiac catheterization for coronary assessment. 3-vessel calcifications noted. Cardiac Morphology: Right Atrium: Right atrial size is dilated. Right Ventricle: The right ventricular cavity is dilated. Left Atrium: Left atrial size is dilated in size. There is contrast mixing artifact in the LAA. Left Ventricle: The ventricular cavity size is within normal limits and there are no stigmata of prior infarction in the visualized portion. Pulmonary arteries: Dilated in size without proximal filling defect. Findings suggestive of pulmonary hypertension. Pulmonary veins: Normal pulmonary venous drainage. Pericardium: Normal thickness with no significant effusion or calcium present. Mitral Valve: The mitral valve is degenerative with severe mitral annular calcification. Extra-cardiac findings: See attached radiology report for non-cardiac structures. IMPRESSION: 1. Trileaflet aortic valve with severe aortic stenosis (calcium score 4577). 2. Cardiac motion artifact present and annular measurements performed using 200 ms reconstruction. 3. Annular measurements favorable for 26 mm Edwards Sapien 3 TAVR (448 mm2). 4. No significant annular calcification. Minimal subannular calcification under the [place]. 5. Sufficient coronary to annulus distance. 6. Optimal Fluoroscopic Angle for Delivery: LAO 27 CAU 4 7. Dilated pulmonary artery suggestive of pulmonary hypertension. 8. Contrast mixing artifact is present in the LAA. 9. Severe mitral annular calcification. [place] T. Audie Box, MD Electronically Signed   By: Eleonore Chiquito   On: 08/30/2020 18:12   Result Date: 08/30/2020 EXAM: OVER-READ INTERPRETATION  CT CHEST The following report is an over-read performed by radiologist Dr. Vinnie Langton of [facility], [place] on 08/30/2020. This over-read does not include interpretation of cardiac  or coronary anatomy or pathology. The coronary calcium score/coronary CTA interpretation by the cardiologist is attached. COMPARISON:  None. FINDINGS: Extracardiac findings will be described separately under dictation for contemporaneously obtained CTA chest, abdomen and  pelvis. IMPRESSION: Please see separate dictation for contemporaneously obtained CTA chest, abdomen and pelvis dated 08/30/2020 for full description of relevant extracardiac findings. Electronically Signed: By: Vinnie Langton M.D. On: 08/30/2020 15:31   DG C-Arm 1-60 [name]  Result Date: 09/20/2020 CLINICAL DATA:  Left femur IM nail. EXAM: OPERATIVE LEFT HIP (WITH PELVIS IF PERFORMED) TECHNIQUE: Fluoroscopic spot image(s) were submitted for interpretation post-operatively. COMPARISON:  Preoperative radiograph. FINDINGS: Four fluoroscopic spot views of the left femur obtained in the operating room. Intramedullary nail with trans trochanteric and distal locking screws fixate intertrochanteric femur fracture. Total fluoroscopy time 1 minutes 38 seconds. IMPRESSION: Procedural fluoroscopy after left femur IM nail. Electronically Signed   By: Keith Rake M.D.   On: 09/20/2020 20:57   CT ANGIO CHEST AORTA W/CM & OR WO/CM  Result Date: 08/31/2020 CLINICAL DATA:  87 year old male with history of severe aortic stenosis. Preprocedural study prior to potential transcatheter aortic valve replacement (TAVR) procedure. EXAM: CT ANGIOGRAPHY CHEST, ABDOMEN AND PELVIS TECHNIQUE: Non-contrast CT of the chest was initially obtained. Multidetector CT imaging through the chest, abdomen and pelvis was performed using the standard protocol during bolus administration of intravenous contrast. Multiplanar reconstructed images and MIPs were obtained and reviewed to evaluate the vascular anatomy. CONTRAST:  54m OMNIPAQUE IOHEXOL 350 MG/ML SOLN COMPARISON:  No priors. FINDINGS: CTA CHEST FINDINGS Cardiovascular: Heart size is enlarged with biatrial dilatation.  There is no significant pericardial fluid, thickening or pericardial calcification. There is aortic atherosclerosis, as well as atherosclerosis of the great vessels of the mediastinum and the coronary arteries, including calcified atherosclerotic plaque in the left main, left anterior descending, left circumflex and right coronary arteries. Severe thickening calcification of the aortic valve. Severe calcifications of the mitral annulus. Dilatation of the pulmonic trunk (4.1 cm in diameter). Mediastinum/Lymph Nodes: No pathologically enlarged mediastinal or hilar lymph nodes. Multiple densely calcified right hilar and mediastinal lymph nodes are incidentally noted. Esophagus is unremarkable in appearance. No axillary lymphadenopathy. Lungs/Pleura: Moderate bilateral pleural effusions lying dependently with areas of passive subsegmental atelectasis in the lower lobes of the lungs bilaterally. In the right lower lobe (axial image 90 of series 6 and coronal image 101 of series 8) there is an elongated nodular density measuring 1.4 x 2.1 x 2.8 cm which appears associated with a pulmonary artery branch to the right lower lobe, and demonstrates enhancement characteristics similar to adjacent vasculature, suspicious for a pulmonary arteriovenous malformation. No other definite suspicious appearing pulmonary nodules or masses are noted. No acute consolidative airspace disease. Mild diffuse bronchial wall thickening with mild centrilobular and paraseptal emphysema. Musculoskeletal/Soft Tissues: There are no aggressive appearing lytic or blastic lesions noted in the visualized portions of the skeleton. CTA ABDOMEN AND PELVIS FINDINGS Hepatobiliary: No suspicious cystic or solid hepatic lesions. No intra or extrahepatic biliary ductal dilatation. Calcified gallstone measuring 1.1 cm in diameter in the dependent portion of the gallbladder. No findings to suggest an acute cholecystitis at this time. Pancreas: No pancreatic mass.  No pancreatic ductal dilatation. No pancreatic or peripancreatic fluid collections or inflammatory changes. Spleen: Unremarkable. Adrenals/Urinary Tract: Low-attenuation lesions in the left kidney, largest of which is in the interpolar region where there is an exophytic 5.2 cm lesion, compatible with simple cysts. Other subcentimeter low-attenuation lesions in the right kidney are too small to definitively characterize, but statistically likely to represent tiny cysts. No aggressive appearing renal lesions. No hydroureteronephrosis. Urinary bladder is nearly decompressed, but otherwise unremarkable in appearance. Stomach/Bowel: The appearance of the stomach is normal.  No pathologic dilatation of small bowel or colon. Short segment of distal small bowel extends into a small right inguinal hernia. The appendix is not confidently identified and may be surgically absent. Regardless, there are no inflammatory changes noted adjacent to the cecum to suggest the presence of an acute appendicitis at this time. Vascular/Lymphatic: Aortic atherosclerosis, with vascular findings and measurements pertinent to potential TAVR procedure, as detailed below. No aneurysm or dissection noted in the abdominal or pelvic vasculature. No lymphadenopathy noted in the abdomen or pelvis. Reproductive: Prostate gland is enlarged measuring up to 5.2 x 4.1 x 5.9 cm. Seminal vesicles are unremarkable in appearance. Other: Small right inguinal hernia containing a short loop of distal small bowel (likely ileum). No significant volume of ascites. No pneumoperitoneum. Musculoskeletal: There are no aggressive appearing lytic or blastic lesions noted in the visualized portions of the skeleton. VASCULAR MEASUREMENTS PERTINENT TO TAVR: AORTA: Minimal Aortic Diameter-13 x 12 mm Severity of Aortic Calcification-severe RIGHT PELVIS: Right Common Iliac Artery - Minimal Diameter-8.6 x 9.7 mm Tortuosity-mild Calcification-moderate Right External Iliac Artery -  Minimal Diameter-7.0 x 7.1 mm Tortuosity-severe Calcification-mild Right Common Femoral Artery - Minimal Diameter-8.4 x 4.3 mm Tortuosity-mild Calcification-moderate to severe LEFT PELVIS: Left Common Iliac Artery - Minimal Diameter-9.5 x 9.2 mm Tortuosity-mild Calcification-moderate Left External Iliac Artery - Minimal Diameter-7.4 x 6.7 mm Tortuosity-severe Calcification-none Left Common Femoral Artery - Minimal Diameter-8.6 x 7.1 mm Tortuosity-mild Calcification-severe Review of the MIP images confirms the above findings. IMPRESSION: 1. Vascular findings and measurements pertinent to potential TAVR procedure, as detailed above. 2. Severe thickening calcification of the aortic valve, compatible with reported clinical history of severe aortic stenosis. 3. Severe dilatation of the pulmonic trunk (4.1 cm in diameter), concerning for pulmonary arterial hypertension. Notably, there is also an elongated nodular density in the right lower lobe which has enhancement characteristics suggestive of a vascular lesions such as a pulmonary arteriovenous malformation. This could be confirmed with PE protocol CT scan if of clinical concern. 4. Severe calcifications of the mitral annulus. 5. Cardiomegaly with biatrial dilatation. 6. Moderate bilateral pleural effusions with areas of passive subsegmental atelectasis in the dependent portions of the lower lobes of the lungs bilaterally. 7. Small right inguinal hernia containing a short segment of distal small bowel, without evidence of bowel incarceration or obstruction at this time. 8. Cholelithiasis without evidence of acute cholecystitis. 9. Prostatomegaly. 10. Additional incidental findings, as above. Electronically Signed   By: Vinnie Langton M.D.   On: 08/31/2020 08:30   ECHO TEE  Result Date: 09/07/2020    TRANSESOPHOGEAL ECHO REPORT   Patient Name:   Ruben [name] Date of Exam: 09/07/2020 Medical Rec #:  [id number]  Height:       66.0 in Accession #:    [id number] Weight:        186.5 lb Date of Birth:  [birth year]   BSA:          1.941 m Patient Age:    81 years   BP:           132/79 mmHg Patient Gender: M          HR:           82 bpm. Exam Location:  Outpatient Procedure: 3D Echo, Transesophageal Echo, Cardiac Doppler, Color Doppler and            Saline Contrast Bubble Study Indications:     I35.0 Nonrheumatic aortic (valve) stenosis  History:  Patient has prior history of Echocardiogram examinations, most                  recent 07/16/2020. CHF, COPD, Aortic Valve Disease;                  Arrythmias:Atrial Fibrillation and RBBB.  Sonographer:     Tiffany Dance Referring Phys:  [id number] Eileen Stanford Diagnosing Phys: Eleonore Chiquito MD PROCEDURE: After discussion of the risks and benefits of a TEE, an informed consent was obtained from the patient. TEE procedure time was 40 minutes. The transesophogeal probe was passed without difficulty through the esophogus of the patient. Local oropharyngeal anesthetic was provided with Cetacaine. Sedation performed by different physician. The patient was monitored while under deep sedation. Anesthestetic sedation was provided intravenously by Anesthesiology: 353m of Propofol, 360mof Lidocaine. Image quality was excellent. The patient's vital signs; including heart rate, blood pressure, and oxygen saturation; remained stable throughout the procedure. The patient developed no complications during the procedure. IMPRESSIONS  1. The MV is degenerative with a small flail P2 segment. 2D VC 0.34 cm, 2D ERO 0.27 cm2 (79 degrees), R vol 28 cc. 3D VCA 0.13 cm2. The flail gap is 0.21 cm and flail width 0.52 cm. The jet is eccentric and there is systolic pulmonary flow reversal in the left upper pulmonary vein, but due to the small flail segment, this is not likely hemodynamically significant. There is mild mitral stenosis with 3D MVA 2.3 cm2. MG 3.8 mmHG @ 78 bpm. The mitral valve is degenerative. Mild to moderate mitral valve regurgitation. Mild  mitral stenosis. The mean mitral valve gradient is 3.8 mmHg with average heart rate of 75 bpm. Severe mitral annular calcification.  2. The bubble study was positive after 6 cardiac cycles, indicating there is an intrapulmonary shunt. There is no interatrial shunt. Agitated saline contrast bubble study was positive with shunting observed after >6 cardiac cycles suggestive of intrapulmonary shunting.  3. The aortic valve is tricuspid. There is severe calcifcation of the aortic valve. There is severe thickening of the aortic valve. Aortic valve regurgitation is mild. Severe aortic valve stenosis. Aortic valve area, by VTI measures 0.52 cm. Aortic valve Vmax measures 3.94 m/s.  4. Left ventricular ejection fraction, by estimation, is 50 to 55%. The left ventricle has low normal function. The left ventricle has no regional wall motion abnormalities.  5. Right ventricular systolic function is mildly reduced. The right ventricular size is moderately enlarged.  6. Left atrial size was severely dilated. No left atrial/left atrial appendage thrombus was detected. The LAA emptying velocity was 32 cm/s.  7. Right atrial size was severely dilated.  8. The tricuspid valve is abnormal. Tricuspid valve regurgitation is moderate to severe.  9. Pulmonic valve regurgitation is moderate. 10. Severely dilated pulmonary artery. FINDINGS  Left Ventricle: Left ventricular ejection fraction, by estimation, is 50 to 55%. The left ventricle has low normal function. The left ventricle has no regional wall motion abnormalities. The left ventricular internal cavity size was normal in size. Right Ventricle: The right ventricular size is moderately enlarged. No increase in right ventricular wall thickness. Right ventricular systolic function is mildly reduced. Left Atrium: Left atrial size was severely dilated. No left atrial/left atrial appendage thrombus was detected. The LAA emptying velocity was 32 cm/s. Right Atrium: Right atrial size was  severely dilated. Pericardium: There is no evidence of pericardial effusion. Mitral Valve: The MV is degenerative with a small flail P2 segment. 2D VC 0.34  cm, 2D ERO 0.27 cm2 (79 degrees), R vol 28 cc. 3D VCA 0.13 cm2. The flail gap is 0.21 cm and flail width 0.52 cm. The jet is eccentric and there is systolic pulmonary flow reversal in the left upper pulmonary vein, but due to the small flail segment, this is not likely hemodynamically significant. There is mild mitral stenosis with 3D MVA 2.3 cm2. MG 3.8 mmHG @ 78 bpm. The mitral valve is degenerative in appearance. Severe  mitral annular calcification. Mild to moderate mitral valve regurgitation. Mild mitral valve stenosis. The mean mitral valve gradient is 3.8 mmHg with average heart rate of 75 bpm. Tricuspid Valve: The tricuspid valve is abnormal. Tricuspid valve regurgitation is moderate to severe. No evidence of tricuspid stenosis. Aortic Valve: The aortic valve is tricuspid. There is severe calcifcation of the aortic valve. There is severe thickening of the aortic valve. Aortic valve regurgitation is mild. Severe aortic stenosis is present. Aortic valve mean gradient measures 38.1  mmHg. Aortic valve peak gradient measures 62.1 mmHg. Aortic valve area, by VTI measures 0.52 cm. Pulmonic Valve: The pulmonic valve was grossly normal. Pulmonic valve regurgitation is moderate. No evidence of pulmonic stenosis. Aorta: The aortic root and ascending aorta are structurally normal, with no evidence of dilitation. Pulmonary Artery: The pulmonary artery is severely dilated. Venous: A pattern of systolic flow reversal, suggestive of severe mitral regurgitation is recorded from the left upper pulmonary vein. IAS/Shunts: The atrial septum is grossly normal. Agitated saline contrast was given intravenously to evaluate for intracardiac shunting. Agitated saline contrast bubble study was positive with shunting observed after >6 cardiac cycles suggestive of intrapulmonary  shunting. The bubble study was positive after 6 cardiac cycles, indicating there is an intrapulmonary shunt. There is no interatrial shunt.  LEFT VENTRICLE PLAX 2D LVOT diam:     2.20 cm LV SV:         53 LV SV Index:   27 LVOT Area:     3.80 cm  AORTIC VALVE AV Area (Vmax):    0.61 cm AV Area (Vmean):   0.59 cm AV Area (VTI):     0.52 cm AV Vmax:           394.08 cm/s AV Vmean:          294.660 cm/s AV VTI:            1.032 m AV Peak Grad:      62.1 mmHg AV Mean Grad:      38.1 mmHg LVOT Vmax:         62.82 cm/s LVOT Vmean:        46.070 cm/s LVOT VTI:          0.140 m LVOT/AV VTI ratio: 0.14  AORTA Ao Root diam: 2.60 cm Ao Asc diam:  3.06 cm MITRAL VALVE                 TRICUSPID VALVE MV Area VTI:  1.45 cm       TR Peak grad:   44.9 mmHg MV Mean grad: 3.8 mmHg       TR Vmax:        335.00 cm/s MV VTI:       0.37 m MR Peak grad:    46.2 mmHg   SHUNTS MR Vmax:         339.91 cm/s Systemic VTI:  0.14 m MR PISA:         9.05 cm    Systemic Diam: 2.20 cm  MR PISA Eff ROA: 103 mm MR PISA Radius:  1.20 cm Eleonore Chiquito MD Electronically signed by Eleonore Chiquito MD Signature Date/Time: 09/07/2020/9:20:02 PM    Final    DG Hip Port Unilat With Pelvis 1V Left  Result Date: 09/20/2020 CLINICAL DATA:  Status post IM nail. EXAM: DG HIP (WITH OR WITHOUT PELVIS) 1V PORT LEFT COMPARISON:  Preoperative radiographs earlier this day. FINDINGS: Intramedullary nail with trans trochanteric and distal screw fixation of intertrochanteric femur fracture. Improved fracture alignment from preoperative imaging. Lesser trochanteric fragment remains displaced. Knee arthroplasty partially included. Recent postsurgical change includes air and edema in the adjacent soft tissues. IMPRESSION: ORIF of intertrochanteric left femur fracture, in improved alignment from preoperative imaging. No immediate postoperative complication. Electronically Signed   By: Keith Rake M.D.   On: 09/20/2020 19:12   DG HIP OPERATIVE UNILAT W OR W/O  PELVIS LEFT  Result Date: 09/20/2020 CLINICAL DATA:  Left femur IM nail. EXAM: OPERATIVE LEFT HIP (WITH PELVIS IF PERFORMED) TECHNIQUE: Fluoroscopic spot image(s) were submitted for interpretation post-operatively. COMPARISON:  Preoperative radiograph. FINDINGS: Four fluoroscopic spot views of the left femur obtained in the operating room. Intramedullary nail with trans trochanteric and distal locking screws fixate intertrochanteric femur fracture. Total fluoroscopy time 1 minutes 38 seconds. IMPRESSION: Procedural fluoroscopy after left femur IM nail. Electronically Signed   By: Keith Rake M.D.   On: 09/20/2020 20:57   DG FEMUR [name] 2 VIEWS LEFT  Result Date: 09/20/2020 CLINICAL DATA:  Fall this morning.  Left hip pain EXAM: LEFT FEMUR 2 VIEWS COMPARISON:  None. FINDINGS: Acute intertrochanteric left femur fracture with medial impaction. Knee arthroplasty without acute superimposed finding. Generalized osteopenia and atherosclerosis. IMPRESSION: Intertrochanteric left femur fracture with medial impaction. Electronically Signed   By: Monte Fantasia M.D.   On: 09/20/2020 09:04   VAS US CAROTID  Result Date: 08/30/2020 Carotid Arterial Duplex Study Indications:       Pre-TAVR, severe aortic stenosis. Comparison Study:  No prior studies. Performing Technologist: Darlin Coco RDMS  Examination Guidelines: A complete evaluation includes B-mode imaging, spectral Doppler, color Doppler, and power Doppler as needed of all accessible portions of each vessel. Bilateral testing is considered an integral part of a complete examination. Limited examinations for reoccurring indications may be performed as noted.  Right Carotid Findings: +----------+--------+--------+--------+-----------------------+--------+           PSV cm/sEDV cm/sStenosisPlaque Description     Comments +----------+--------+--------+--------+-----------------------+--------+ CCA Prox  60      13                                               +----------+--------+--------+--------+-----------------------+--------+ CCA Distal51      13              heterogenous and smooth         +----------+--------+--------+--------+-----------------------+--------+ ICA Prox  75      23      1-39%   calcific                        +----------+--------+--------+--------+-----------------------+--------+ ICA Distal99      28                                     tortuous +----------+--------+--------+--------+-----------------------+--------+ ECA  64                                                      +----------+--------+--------+--------+-----------------------+--------+ +----------+--------+-------+----------------+-------------------+           PSV cm/sEDV cmsDescribe        Arm Pressure (mmHG) +----------+--------+-------+----------------+-------------------+ [id number]             Multiphasic, WNL                    +----------+--------+-------+----------------+-------------------+ +---------+--------+--+--------+--+---------+ VertebralPSV cm/s46EDV cm/s12Antegrade +---------+--------+--+--------+--+---------+  Left Carotid Findings: +----------+--------+--------+--------+-------------------------+--------+           PSV cm/sEDV cm/sStenosisPlaque Description       Comments +----------+--------+--------+--------+-------------------------+--------+ CCA Prox  75      13              hyperechoic and irregular         +----------+--------+--------+--------+-------------------------+--------+ CCA Distal58      17              hyperechoic                       +----------+--------+--------+--------+-------------------------+--------+ ICA Prox  66      25      1-39%   calcific                          +----------+--------+--------+--------+-------------------------+--------+ ICA Distal64      27                                       tortuous  +----------+--------+--------+--------+-------------------------+--------+ ECA       69                                                        +----------+--------+--------+--------+-------------------------+--------+ +----------+--------+--------+----------------+-------------------+           PSV cm/sEDV cm/sDescribe        Arm Pressure (mmHG) +----------+--------+--------+----------------+-------------------+ [id number]              Multiphasic, WNL                    +----------+--------+--------+----------------+-------------------+ +---------+--------+--+--------+--+---------+ VertebralPSV cm/s35EDV cm/s10Antegrade +---------+--------+--+--------+--+---------+   Summary: Right Carotid: Velocities in the right ICA are consistent with a 1-39% stenosis. Left Carotid: Velocities in the left ICA are consistent with a 1-39% stenosis.               Non-hemodynamically significant plaque <50% noted in the CCA. Vertebrals:  Bilateral vertebral arteries demonstrate antegrade flow. Subclavians: Normal flow hemodynamics were seen in bilateral subclavian              arteries. *See table(s) above for measurements and observations.  Electronically signed by Deitra Mayo MD on 08/30/2020 at 11:27:17 AM.    Final    CT ANGIO ABDOMEN PELVIS  W &/OR WO CONTRAST  Result Date: 08/31/2020 CLINICAL DATA:  87 year old male with history of severe aortic stenosis. Preprocedural study prior to potential transcatheter aortic valve replacement (TAVR) procedure. EXAM: CT ANGIOGRAPHY CHEST, ABDOMEN AND  PELVIS TECHNIQUE: Non-contrast CT of the chest was initially obtained. Multidetector CT imaging through the chest, abdomen and pelvis was performed using the standard protocol during bolus administration of intravenous contrast. Multiplanar reconstructed images and MIPs were obtained and reviewed to evaluate the vascular anatomy. CONTRAST:  76m OMNIPAQUE IOHEXOL 350 MG/ML SOLN COMPARISON:  No priors.  FINDINGS: CTA CHEST FINDINGS Cardiovascular: Heart size is enlarged with biatrial dilatation. There is no significant pericardial fluid, thickening or pericardial calcification. There is aortic atherosclerosis, as well as atherosclerosis of the great vessels of the mediastinum and the coronary arteries, including calcified atherosclerotic plaque in the left main, left anterior descending, left circumflex and right coronary arteries. Severe thickening calcification of the aortic valve. Severe calcifications of the mitral annulus. Dilatation of the pulmonic trunk (4.1 cm in diameter). Mediastinum/Lymph Nodes: No pathologically enlarged mediastinal or hilar lymph nodes. Multiple densely calcified right hilar and mediastinal lymph nodes are incidentally noted. Esophagus is unremarkable in appearance. No axillary lymphadenopathy. Lungs/Pleura: Moderate bilateral pleural effusions lying dependently with areas of passive subsegmental atelectasis in the lower lobes of the lungs bilaterally. In the right lower lobe (axial image 90 of series 6 and coronal image 101 of series 8) there is an elongated nodular density measuring 1.4 x 2.1 x 2.8 cm which appears associated with a pulmonary artery branch to the right lower lobe, and demonstrates enhancement characteristics similar to adjacent vasculature, suspicious for a pulmonary arteriovenous malformation. No other definite suspicious appearing pulmonary nodules or masses are noted. No acute consolidative airspace disease. Mild diffuse bronchial wall thickening with mild centrilobular and paraseptal emphysema. Musculoskeletal/Soft Tissues: There are no aggressive appearing lytic or blastic lesions noted in the visualized portions of the skeleton. CTA ABDOMEN AND PELVIS FINDINGS Hepatobiliary: No suspicious cystic or solid hepatic lesions. No intra or extrahepatic biliary ductal dilatation. Calcified gallstone measuring 1.1 cm in diameter in the dependent portion of the  gallbladder. No findings to suggest an acute cholecystitis at this time. Pancreas: No pancreatic mass. No pancreatic ductal dilatation. No pancreatic or peripancreatic fluid collections or inflammatory changes. Spleen: Unremarkable. Adrenals/Urinary Tract: Low-attenuation lesions in the left kidney, largest of which is in the interpolar region where there is an exophytic 5.2 cm lesion, compatible with simple cysts. Other subcentimeter low-attenuation lesions in the right kidney are too small to definitively characterize, but statistically likely to represent tiny cysts. No aggressive appearing renal lesions. No hydroureteronephrosis. Urinary bladder is nearly decompressed, but otherwise unremarkable in appearance. Stomach/Bowel: The appearance of the stomach is normal. No pathologic dilatation of small bowel or colon. Short segment of distal small bowel extends into a small right inguinal hernia. The appendix is not confidently identified and may be surgically absent. Regardless, there are no inflammatory changes noted adjacent to the cecum to suggest the presence of an acute appendicitis at this time. Vascular/Lymphatic: Aortic atherosclerosis, with vascular findings and measurements pertinent to potential TAVR procedure, as detailed below. No aneurysm or dissection noted in the abdominal or pelvic vasculature. No lymphadenopathy noted in the abdomen or pelvis. Reproductive: Prostate gland is enlarged measuring up to 5.2 x 4.1 x 5.9 cm. Seminal vesicles are unremarkable in appearance. Other: Small right inguinal hernia containing a short loop of distal small bowel (likely ileum). No significant volume of ascites. No pneumoperitoneum. Musculoskeletal: There are no aggressive appearing lytic or blastic lesions noted in the visualized portions of the skeleton. VASCULAR MEASUREMENTS PERTINENT TO TAVR: AORTA: Minimal Aortic Diameter-13 x 12 mm Severity of Aortic Calcification-severe RIGHT PELVIS: Right Common  Iliac  Artery - Minimal Diameter-8.6 x 9.7 mm Tortuosity-mild Calcification-moderate Right External Iliac Artery - Minimal Diameter-7.0 x 7.1 mm Tortuosity-severe Calcification-mild Right Common Femoral Artery - Minimal Diameter-8.4 x 4.3 mm Tortuosity-mild Calcification-moderate to severe LEFT PELVIS: Left Common Iliac Artery - Minimal Diameter-9.5 x 9.2 mm Tortuosity-mild Calcification-moderate Left External Iliac Artery - Minimal Diameter-7.4 x 6.7 mm Tortuosity-severe Calcification-none Left Common Femoral Artery - Minimal Diameter-8.6 x 7.1 mm Tortuosity-mild Calcification-severe Review of the MIP images confirms the above findings. IMPRESSION: 1. Vascular findings and measurements pertinent to potential TAVR procedure, as detailed above. 2. Severe thickening calcification of the aortic valve, compatible with reported clinical history of severe aortic stenosis. 3. Severe dilatation of the pulmonic trunk (4.1 cm in diameter), concerning for pulmonary arterial hypertension. Notably, there is also an elongated nodular density in the right lower lobe which has enhancement characteristics suggestive of a vascular lesions such as a pulmonary arteriovenous malformation. This could be confirmed with PE protocol CT scan if of clinical concern. 4. Severe calcifications of the mitral annulus. 5. Cardiomegaly with biatrial dilatation. 6. Moderate bilateral pleural effusions with areas of passive subsegmental atelectasis in the dependent portions of the lower lobes of the lungs bilaterally. 7. Small right inguinal hernia containing a short segment of distal small bowel, without evidence of bowel incarceration or obstruction at this time. 8. Cholelithiasis without evidence of acute cholecystitis. 9. Prostatomegaly. 10. Additional incidental findings, as above. Electronically Signed   By: Vinnie Langton M.D.   On: 08/31/2020 08:30    (Echo, Carotid, EGD, Colonoscopy, ERCP)    Subjective: Patient seen and examined.   Intermittent pain in a spasm left hip.  Today pain is better controlled.  Patient son at the bedside.  Voiding well.  No nausea or vomiting.  Pain controlled with oral oxycodone.    Discharge Exam: Vitals:   09/23/20 0846 09/23/20 0856  BP: (!) 90/56   Pulse: 65 78  Resp: 17 18  Temp: (!) 97.4 F (36.3 C)   SpO2: 90% 90%   Vitals:   09/22/20 2026 09/23/20 0434 09/23/20 0846 09/23/20 0856  BP:  117/68 (!) 90/56   Pulse: 86 60 65 78  Resp: _0 Temp:  98 F (36.7 C) (!) 97.4 F (36.3 C)   TempSrc:  Oral Oral   SpO2: 93% 91% 90% 90%  Weight:      Height:        General: Pt is alert, awake, not in acute distress Patient is on 2 L oxygen and looks comfortable. Cardiovascular: RRR, S1/S2 +, no rubs, no gallops, he has holosystolic ejection murmur. Respiratory: CTA bilaterally, no wheezing, no rhonchi, no added sounds. Abdominal: Soft, NT, ND, bowel sounds + Extremities: no edema, no cyanosis Left lateral thigh incision clean and dry.  Minimal tenderness along the incision line as expected.    The results of significant diagnostics from this hospitalization (including imaging, microbiology, ancillary and laboratory) are listed below for reference.     Microbiology: Recent Results (from the past 240 hour(s))  Surgical pcr screen     Status: None   Collection Time: 09/16/20 11:21 AM   Specimen: Nasal Mucosa; Nasal Swab  Result Value Ref Range Status   MRSA, PCR NEGATIVE NEGATIVE Final   Staphylococcus aureus NEGATIVE NEGATIVE Final    Comment: (NOTE) The Xpert SA Assay (FDA approved for NASAL specimens in patients 4 years of age and older), is one component of a comprehensive surveillance program. It is not intended  to diagnose infection nor to guide or monitor treatment. Performed at [facility], [place] [address], [place], [place] [address]   SARS CORONAVIRUS 2 (TAT 6-24 HRS) Nasopharyngeal Nasopharyngeal Swab     Status: None   Collection Time:  09/16/20 11:34 AM   Specimen: Nasopharyngeal Swab  Result Value Ref Range Status   SARS Coronavirus 2 NEGATIVE NEGATIVE Final    Comment: (NOTE) SARS-CoV-2 target nucleic acids are NOT DETECTED.  The SARS-CoV-2 RNA is generally detectable in upper and lower respiratory specimens during the acute phase of infection. Negative results do not preclude SARS-CoV-2 infection, do not rule out co-infections with other pathogens, and should not be used as the sole basis for treatment or other patient management decisions. Negative results must be combined with clinical observations, patient history, and epidemiological information. The expected result is Negative.  Fact Sheet for Patients: SugarRoll.be  Fact Sheet for Healthcare Providers: https://www.woods-mathews.com/  This test is not yet approved or cleared by the [place] FDA and  has been authorized for detection and/or diagnosis of SARS-CoV-2 by FDA under an Emergency Use Authorization (EUA). This EUA will remain  in effect (meaning this test can be used) for the duration of the COVID-19 declaration under Se ction 564(b)(1) of the Act, 21 U.S.C. section 360bbb-3(b)(1), unless the authorization is terminated or revoked sooner.  Performed at [facility], [place] [address] [place], [place] [address]   SARS CORONAVIRUS 2 (TAT 6-24 HRS) Nasopharyngeal Nasopharyngeal Swab     Status: None   Collection Time: 09/20/20  3:00 PM   Specimen: Nasopharyngeal Swab  Result Value Ref Range Status   SARS Coronavirus 2 NEGATIVE NEGATIVE Final    Comment: (NOTE) SARS-CoV-2 target nucleic acids are NOT DETECTED.  The SARS-CoV-2 RNA is generally detectable in upper and lower respiratory specimens during the acute phase of infection. Negative results do not preclude SARS-CoV-2 infection, do not rule out co-infections with other pathogens, and should not be used as the sole basis for treatment or  other patient management decisions. Negative results must be combined with clinical observations, patient history, and epidemiological information. The expected result is Negative.  Fact Sheet for Patients: SugarRoll.be  Fact Sheet for Healthcare Providers: https://www.woods-mathews.com/  This test is not yet approved or cleared by the [place] FDA and  has been authorized for detection and/or diagnosis of SARS-CoV-2 by FDA under an Emergency Use Authorization (EUA). This EUA will remain  in effect (meaning this test can be used) for the duration of the COVID-19 declaration under Se ction 564(b)(1) of the Act, 21 U.S.C. section 360bbb-3(b)(1), unless the authorization is terminated or revoked sooner.  Performed at [facility], [place] [address], [place], [place] [address]   Resp Panel by RT-PCR (Flu A&B, Covid) Nasopharyngeal Swab     Status: None   Collection Time: 09/22/20  4:36 PM   Specimen: Nasopharyngeal Swab; Nasopharyngeal(NP) swabs in vial transport medium  Result Value Ref Range Status   SARS Coronavirus 2 by RT PCR NEGATIVE NEGATIVE Final    Comment: (NOTE) SARS-CoV-2 target nucleic acids are NOT DETECTED.  The SARS-CoV-2 RNA is generally detectable in upper respiratory specimens during the acute phase of infection. The lowest concentration of SARS-CoV-2 viral copies this assay can detect is 138 copies/mL. A negative result does not preclude SARS-Cov-2 infection and should not be used as the sole basis for treatment or other patient management decisions. A negative result may occur with  improper specimen collection/handling, submission  of specimen other than nasopharyngeal swab, presence of viral mutation(s) within the areas targeted by this assay, and inadequate number of viral copies(<138 copies/mL). A negative result must be combined with clinical observations, patient history, and epidemiological information.  The expected result is Negative.  Fact Sheet for Patients:  EntrepreneurPulse.com.au  Fact Sheet for Healthcare Providers:  IncredibleEmployment.be  This test is no t yet approved or cleared by the [place] FDA and  has been authorized for detection and/or diagnosis of SARS-CoV-2 by FDA under an Emergency Use Authorization (EUA). This EUA will remain  in effect (meaning this test can be used) for the duration of the COVID-19 declaration under Section 564(b)(1) of the Act, 21 U.S.C.section 360bbb-3(b)(1), unless the authorization is terminated  or revoked sooner.       Influenza A by PCR NEGATIVE NEGATIVE Final   Influenza B by PCR NEGATIVE NEGATIVE Final    Comment: (NOTE) The Xpert Xpress SARS-CoV-2/FLU/RSV plus assay is intended as an aid in the diagnosis of influenza from Nasopharyngeal swab specimens and should not be used as a sole basis for treatment. Nasal washings and aspirates are unacceptable for Xpert Xpress SARS-CoV-2/FLU/RSV testing.  Fact Sheet for Patients: EntrepreneurPulse.com.au  Fact Sheet for Healthcare Providers: IncredibleEmployment.be  This test is not yet approved or cleared by the [place] FDA and has been authorized for detection and/or diagnosis of SARS-CoV-2 by FDA under an Emergency Use Authorization (EUA). This EUA will remain in effect (meaning this test can be used) for the duration of the COVID-19 declaration under Section 564(b)(1) of the Act, 21 U.S.C. section 360bbb-3(b)(1), unless the authorization is terminated or revoked.  Performed at [facility], [place] [address], [place], [place] [address]      Labs: BNP (last 3 results) Recent Labs    07/15/20 1256  BNP 785.8*   Basic Metabolic Panel: Recent Labs  Lab 09/16/20 1121 09/20/20 0803 09/21/20 0459 09/21/20 1114  NA 138 141 138  --   K 3.8 4.5 4.4  --   CL 99 105 104  --   CO2 _0 --   GLUCOSE 94 132* 123*  --   BUN 25* 28* 26*  --   CREATININE 1.00 1.03 0.92  --   CALCIUM 8.9 9.0 8.4*  --   MG  --   --   --  1.9  PHOS  --   --   --  3.5   Liver Function Tests: Recent Labs  Lab 09/16/20 1121 09/20/20 0803  AST 17 16  ALT 12 12  ALKPHOS 74 71  BILITOT 1.3* 1.4*  PROT 6.9 6.7  ALBUMIN 3.7 3.5   No results for input(s): LIPASE, AMYLASE in the last 168 hours. No results for input(s): AMMONIA in the last 168 hours. CBC: Recent Labs  Lab 09/16/20 1121 09/20/20 0803 09/21/20 0459 09/22/20 0143 09/23/20 0144  WBC 7.0 7.7 5.4 8.8 9.8  NEUTROABS  --  6.2  --   --   --   HGB 13.0 12.6* 9.9* 11.0* 9.7*  HCT 39.5 40.9 30.0* 35.1* 30.0*  MCV 100.3* 103.3* 101.4* 102.0* 99.7  PLT 198 179 148* 147* 140*   Cardiac Enzymes: No results for input(s): CKTOTAL, CKMB, CKMBINDEX, TROPONINI in the last 168 hours. BNP: Invalid input(s): POCBNP CBG: No results for input(s): GLUCAP in the last 168 hours. D-Dimer No results for input(s): DDIMER in the last 72 hours. Hgb A1c No results for input(s): HGBA1C in the last 72  hours. Lipid Profile No results for input(s): CHOL, HDL, LDLCALC, TRIG, CHOLHDL, LDLDIRECT in the last 72 hours. Thyroid function studies No results for input(s): TSH, T4TOTAL, T3FREE, THYROIDAB in the last 72 hours.  Invalid input(s): FREET3 Anemia work up Recent Labs    09/21/20 0459  VITAMINB12 348  FOLATE 8.9   Urinalysis    Component Value Date/Time   COLORURINE STRAW (A) 09/16/2020 [place] 09/16/2020 1121   LABSPEC 1.006 09/16/2020 1121   PHURINE 7.0 09/16/2020 1121   GLUCOSEU NEGATIVE 09/16/2020 1121   HGBUR MODERATE (A) 09/16/2020 1121   BILIRUBINUR NEGATIVE 09/16/2020 [place] 09/16/2020 [place] 09/16/2020 1121   NITRITE NEGATIVE 09/16/2020 1121   LEUKOCYTESUR NEGATIVE 09/16/2020 1121   Sepsis Labs Invalid input(s): PROCALCITONIN,  WBC,   LACTICIDVEN Microbiology Recent Results (from the past 240 hour(s))  Surgical pcr screen     Status: None   Collection Time: 09/16/20 11:21 AM   Specimen: Nasal Mucosa; Nasal Swab  Result Value Ref Range Status   MRSA, PCR NEGATIVE NEGATIVE Final   Staphylococcus aureus NEGATIVE NEGATIVE Final    Comment: (NOTE) The Xpert SA Assay (FDA approved for NASAL specimens in patients 26 years of age and older), is one component of a comprehensive surveillance program. It is not intended to diagnose infection nor to guide or monitor treatment. Performed at [facility], [place] [address], [place], [place] [address]   SARS CORONAVIRUS 2 (TAT 6-24 HRS) Nasopharyngeal Nasopharyngeal Swab     Status: None   Collection Time: 09/16/20 11:34 AM   Specimen: Nasopharyngeal Swab  Result Value Ref Range Status   SARS Coronavirus 2 NEGATIVE NEGATIVE Final    Comment: (NOTE) SARS-CoV-2 target nucleic acids are NOT DETECTED.  The SARS-CoV-2 RNA is generally detectable in upper and lower respiratory specimens during the acute phase of infection. Negative results do not preclude SARS-CoV-2 infection, do not rule out co-infections with other pathogens, and should not be used as the sole basis for treatment or other patient management decisions. Negative results must be combined with clinical observations, patient history, and epidemiological information. The expected result is Negative.  Fact Sheet for Patients: SugarRoll.be  Fact Sheet for Healthcare Providers: https://www.woods-mathews.com/  This test is not yet approved or cleared by the [place] FDA and  has been authorized for detection and/or diagnosis of SARS-CoV-2 by FDA under an Emergency Use Authorization (EUA). This EUA will remain  in effect (meaning this test can be used) for the duration of the COVID-19 declaration under Se ction 564(b)(1) of the Act, 21 U.S.C. section 360bbb-3(b)(1),  unless the authorization is terminated or revoked sooner.  Performed at [facility], [place] [address] [place], [place] [address]   SARS CORONAVIRUS 2 (TAT 6-24 HRS) Nasopharyngeal Nasopharyngeal Swab     Status: None   Collection Time: 09/20/20  3:00 PM   Specimen: Nasopharyngeal Swab  Result Value Ref Range Status   SARS Coronavirus 2 NEGATIVE NEGATIVE Final    Comment: (NOTE) SARS-CoV-2 target nucleic acids are NOT DETECTED.  The SARS-CoV-2 RNA is generally detectable in upper and lower respiratory specimens during the acute phase of infection. Negative results do not preclude SARS-CoV-2 infection, do not rule out co-infections with other pathogens, and should not be used as the sole basis for treatment or other patient management decisions. Negative results must be combined with clinical observations, patient history, and epidemiological information. The expected result is Negative.  Fact  Sheet for Patients: SugarRoll.be  Fact Sheet for Healthcare Providers: https://www.woods-mathews.com/  This test is not yet approved or cleared by the [place] FDA and  has been authorized for detection and/or diagnosis of SARS-CoV-2 by FDA under an Emergency Use Authorization (EUA). This EUA will remain  in effect (meaning this test can be used) for the duration of the COVID-19 declaration under Se ction 564(b)(1) of the Act, 21 U.S.C. section 360bbb-3(b)(1), unless the authorization is terminated or revoked sooner.  Performed at [facility], [place] [address], [place], [place] [address]   Resp Panel by RT-PCR (Flu A&B, Covid) Nasopharyngeal Swab     Status: None   Collection Time: 09/22/20  4:36 PM   Specimen: Nasopharyngeal Swab; Nasopharyngeal(NP) swabs in vial transport medium  Result Value Ref Range Status   SARS Coronavirus 2 by RT PCR NEGATIVE NEGATIVE Final    Comment: (NOTE) SARS-CoV-2 target nucleic acids are NOT  DETECTED.  The SARS-CoV-2 RNA is generally detectable in upper respiratory specimens during the acute phase of infection. The lowest concentration of SARS-CoV-2 viral copies this assay can detect is 138 copies/mL. A negative result does not preclude SARS-Cov-2 infection and should not be used as the sole basis for treatment or other patient management decisions. A negative result may occur with  improper specimen collection/handling, submission of specimen other than nasopharyngeal swab, presence of viral mutation(s) within the areas targeted by this assay, and inadequate number of viral copies(<138 copies/mL). A negative result must be combined with clinical observations, patient history, and epidemiological information. The expected result is Negative.  Fact Sheet for Patients:  EntrepreneurPulse.com.au  Fact Sheet for Healthcare Providers:  IncredibleEmployment.be  This test is no t yet approved or cleared by the [place] FDA and  has been authorized for detection and/or diagnosis of SARS-CoV-2 by FDA under an Emergency Use Authorization (EUA). This EUA will remain  in effect (meaning this test can be used) for the duration of the COVID-19 declaration under Section 564(b)(1) of the Act, 21 U.S.C.section 360bbb-3(b)(1), unless the authorization is terminated  or revoked sooner.       Influenza A by PCR NEGATIVE NEGATIVE Final   Influenza B by PCR NEGATIVE NEGATIVE Final    Comment: (NOTE) The Xpert Xpress SARS-CoV-2/FLU/RSV plus assay is intended as an aid in the diagnosis of influenza from Nasopharyngeal swab specimens and should not be used as a sole basis for treatment. Nasal washings and aspirates are unacceptable for Xpert Xpress SARS-CoV-2/FLU/RSV testing.  Fact Sheet for Patients: EntrepreneurPulse.com.au  Fact Sheet for Healthcare Providers: IncredibleEmployment.be  This test is not yet  approved or cleared by the [place] FDA and has been authorized for detection and/or diagnosis of SARS-CoV-2 by FDA under an Emergency Use Authorization (EUA). This EUA will remain in effect (meaning this test can be used) for the duration of the COVID-19 declaration under Section 564(b)(1) of the Act, 21 U.S.C. section 360bbb-3(b)(1), unless the authorization is terminated or revoked.  Performed at [facility], [place] [address], [place], [place] [address]      Time coordinating discharge:  35 minutes  SIGNED:   Barb Merino, MD  [facility] 09/23/2020, 9:22 AM

## 2020-09-28 ENCOUNTER — Telehealth: Payer: Self-pay

## 2020-09-28 NOTE — Telephone Encounter (Signed)
  [facility]   MULTIDISCIPLINARY HEART VALVE TEAM  I contacted the pt's son to see how the pt is progressing since Orthopaedic surgery last week.  The pt is currently a resident at [facility] and he just started working with PT yesterday. The pt is expected to be in a SNF for a few weeks for rehab. At this time we would like to see the pt in a few weeks to see how he is progressing and discuss rescheduling TAVR.  I have scheduled the pt to see Carlean Jews PA-C on 3/9 at 2:30 PM. The pt has not been scheduled for outpatient follow-up with Orthopaedic at this time and I advised the son to call and arrange follow-up. The pt's son agreed with plan.  I contacted [facility] and spoke with Lamont Dowdy in regards to the pt's 3/9 appointment with cardiology.  She will relay appointment information to arrange transportation.

## 2020-10-12 ENCOUNTER — Ambulatory Visit (INDEPENDENT_AMBULATORY_CARE_PROVIDER_SITE_OTHER): Payer: Medicare Other | Admitting: Physician Assistant

## 2020-10-12 ENCOUNTER — Other Ambulatory Visit: Payer: Self-pay

## 2020-10-12 ENCOUNTER — Encounter: Payer: Self-pay | Admitting: Physician Assistant

## 2020-10-12 VITALS — BP 128/64 | HR 95 | Ht 66.0 in | Wt 212.4 lb

## 2020-10-12 DIAGNOSIS — I482 Chronic atrial fibrillation, unspecified: Secondary | ICD-10-CM

## 2020-10-12 DIAGNOSIS — S72002F Fracture of unspecified part of neck of left femur, subsequent encounter for open fracture type IIIA, IIIB, or IIIC with routine healing: Secondary | ICD-10-CM

## 2020-10-12 DIAGNOSIS — I350 Nonrheumatic aortic (valve) stenosis: Secondary | ICD-10-CM | POA: Diagnosis not present

## 2020-10-12 DIAGNOSIS — I5033 Acute on chronic diastolic (congestive) heart failure: Secondary | ICD-10-CM

## 2020-10-12 MED ORDER — TORSEMIDE 20 MG PO TABS: ORAL_TABLET | ORAL | 0 refills | Status: DC

## 2020-10-12 MED ORDER — POTASSIUM CHLORIDE CRYS ER 20 MEQ PO TBCR: 20.0000 meq | EXTENDED_RELEASE_TABLET | Freq: Every day | ORAL | 2 refills | Status: DC

## 2020-10-12 NOTE — Patient Instructions (Signed)
Medication Instructions:   INCREASE YOUR TORSEMIDE TO TAKING 60 MG BY MOUTH DAILY FOR 2 DAYS ONLY, THEN TAKE 40 MG BY MOUTH DAILY THEREAFTER.   INCREASE YOUR POTASSIUM CHLORIDE TO 20 mEq BY MOUTH DAILY  *If you need a refill on your cardiac medications before your next appointment, please call your pharmacy*   Lab Work:  TODAY--BMET AND PRO-BNP  If you have labs (blood work) drawn today and your tests are completely normal, you will receive your results only by: Marland Kitchen MyChart Message (if you have MyChart) OR . A paper copy in the mail If you have any lab test that is abnormal or we need to change your treatment, we will call you to review the results.   Follow-Up:  Cline Crock PA-C IN THE OFFICE NEXT Thursday 10/20/20 AT 1:30 PM.

## 2020-10-12 NOTE — Progress Notes (Signed)
[facility] [place]                                     Cardiology Office Note:    Date:  10/12/2020   ID:  Ruben [name], DOB Jan 26, 1927, MRN [id number]  PCP:  Geralynn Rile, MD  [place] Cardiologist:  [name] (previously Dr. Meda Coffee) [place] Electrophysiologist:  None   Referring MD: Alroy Dust, L.Marlou Sa, MD   Follow up after hip fracture and discuss TAVR  History of Present Illness:    Ruben [name] is a 87 y.o. male with a hx of chronic diastolic CHF, RBBB, mitral regurgitation, HTN, COPD on home 02, longstanding persistent atrial fibrillation on warfarin, degenerative arthritis, severe aortic stenosis being worked up for TAVR, and recent mechanical fall with hip fracture s/p ORIF who presents to clinic for follow up.   Patient has remained functionally independent and remarkably active for a gentleman his age.  He has history of longstanding persistent atrial fibrillation of uncertain duration.  He has been anticoagulated using warfarin for many years without any history of stroke or bleeding complication.  For the last several years he has been told that he has a heart murmur and aortic stenosis.  Up until recently he has been followed by cardiologist in [place].    The patient relocated to [place] to live near his son. In December 2021 he was hospitalized with acute hypoxic respiratory failure felt related to a combination of COPD with acute exacerbation, rhinovirus infection, and acute on chronic diastolic congestive heart failure.  Symptoms improved with antibiotics, diuresis, and nebulized bronchodilators.  Transthoracic echocardiogram performed at that time revealed normal left ventricular systolic function with severe aortic stenosis and what was initially felt to be mild mitral regurgitation.  He was ultimately discharged home on home oxygen therapy. He was referred to the multidisciplinary heart valve clinic and was  evaluated by Dr. Burt Knack. Diagnostic cardiac catheterization 08/24/20 revealed diffuse calcific nonobstructive coronary artery disease with severe aortic stenosis and mild pulmonary hypertension. TEE confirmed presence of findings consistent with severe aortic stenosis.  TEE also revealed severe mitral regurgitation with a small flail segment of the middle scallop of the posterior leaflet and an eccentric jet of mitral regurgitation with flow reversal in the pulmonary veins. There was severe mitral annular calcification and functional anatomy of the valve did not appear amenable to transcatheter edge-to-edge repair.   The patient has been evaluated by the multidisciplinary valve team and felt to have severe, symptomatic aortic stenosis and to be a suitable candidate for TAVR, which was set up for 09/20/20. On his way to the hospital for his valve surgery on 2/15, the patient suffered a mechanical fall while trying to get into the car that resulted in a hip fracture. He underwent successful ORIF by Dr. Percell Miller on 09/21/20,  Today he presents to clinic for follow up to assess clinical progress and discuss rescheduling TAVR. Here with his son. Living at [facility].  Not making a lot of progress with physical rehab. Only able to walk about 15 feet. Still has significant pain in left hip and leg. Was recently placed on steroids by orthopedic MD. Legs are more swollen and weight up about 20 lbs. Has shortness of breath with exertion.    Past Medical History:  Diagnosis Date  . Arthritis   . CHF (congestive  heart failure) ([place])   . COPD (chronic obstructive pulmonary disease) ([place])   . Mitral regurgitation   . Persistent atrial fibrillation ([facility])    on coumadin   . RBBB   . Severe aortic stenosis     Past Surgical History:  Procedure Laterality Date  . APPENDECTOMY    . BUBBLE STUDY  09/07/2020   Procedure: BUBBLE STUDY;  Surgeon: Geralynn Rile, MD;  Location: [facility];  Service:  Cardiovascular;;  . CARDIAC CATHETERIZATION    . CLAVICLE SURGERY    . INTRAMEDULLARY (IM) NAIL INTERTROCHANTERIC Left 09/20/2020   Procedure: INTRAMEDULLARY (IM) NAIL INTERTROCHANTRIC;  Surgeon: Renette Butters, MD;  Location: [facility];  Service: Orthopedics;  Laterality: Left;  . RIGHT/LEFT HEART CATH AND CORONARY ANGIOGRAPHY N/A 08/24/2020   Procedure: RIGHT/LEFT HEART CATH AND CORONARY ANGIOGRAPHY;  Surgeon: Sherren Mocha, MD;  Location: [facility];  Service: Cardiovascular;  Laterality: N/A;  . TEE WITHOUT CARDIOVERSION N/A 09/07/2020   Procedure: TRANSESOPHAGEAL ECHOCARDIOGRAM (TEE);  Surgeon: Geralynn Rile, MD;  Location: [facility];  Service: Cardiovascular;  Laterality: N/A;  . TOTAL KNEE ARTHROPLASTY Bilateral     Current Medications: Current Meds  Medication Sig  . acetaminophen (TYLENOL) 500 MG tablet Take 1 tablet (500 mg total) by mouth every 6 (six) hours as needed for mild pain or moderate pain.  . Camphor-Menthol-Methyl Sal (SALONPAS) 3.08-11-08 % PTCH Place 1 patch onto the skin daily as needed (muscle aches/pain.).  Marland Kitchen diclofenac Sodium (VOLTAREN) 1 % GEL Apply 1 application topically 4 (four) times daily as needed (for pain).  Marland Kitchen docusate sodium (COLACE) 100 MG capsule Take 1 capsule (100 mg total) by mouth 2 (two) times daily.  Marland Kitchen dutasteride (AVODART) 0.5 MG capsule Take 0.5 mg by mouth daily.  . Eyelid Cleansers (EYESCRUB) PADS Place 1 each into both eyes daily as needed (dry eye).  . Glycerin-Polysorbate 80 (REFRESH DRY EYE THERAPY OP) Place 1 drop into both eyes daily as needed (Dry eyes).  Marland Kitchen guaiFENesin (MUCINEX) 600 MG 12 hr tablet Take 600 mg by mouth daily as needed for to loosen phlegm.  Marland Kitchen HYDROcodone-acetaminophen (NORCO/VICODIN) 5-325 MG tablet Take 1 tablet by mouth 3 (three) times daily as needed.  . levalbuterol (XOPENEX HFA) 45 MCG/ACT inhaler Inhale 2 puffs into the lungs every 4 (four) hours as needed for wheezing or shortness of breath.  .  levalbuterol (XOPENEX) 0.63 MG/3ML nebulizer solution Take 0.63 mg by nebulization 2 (two) times daily.  Marland Kitchen METAMUCIL FIBER PO Take 11.6 g by mouth daily as needed (Constipation).  . methocarbamol (ROBAXIN) 500 MG tablet Take 1 tablet (500 mg total) by mouth every 6 (six) hours as needed for muscle spasms.  . methylPREDNISolone (MEDROL DOSEPAK) 4 MG TBPK tablet Take by mouth.  . metoprolol succinate (TOPROL-XL) 50 MG 24 hr tablet Take 50 mg by mouth daily.  . montelukast (SINGULAIR) 10 MG tablet Take 10 mg by mouth daily.  . polyethylene glycol (MIRALAX / GLYCOLAX) 17 g packet Take 17 g by mouth daily as needed for mild constipation.  . potassium chloride SA (KLOR-CON) 20 MEQ tablet Take 1 tablet (20 mEq total) by mouth daily.  . tamsulosin (FLOMAX) 0.4 MG CAPS capsule Take 0.8 mg by mouth at bedtime.  . tizanidine (ZANAFLEX) 2 MG capsule Take 2 mg by mouth at bedtime as needed for muscle spasms.  Marland Kitchen torsemide (DEMADEX) 20 MG tablet Take 3 tablets (60 mg total) by mouth daily x 2 days only, then take 2 tablets (  40 mg total) by mouth daily thereafter.  . traMADol (ULTRAM) 50 MG tablet Take 50 mg by mouth daily as needed.  . warfarin (COUMADIN) 6 MG tablet Take 6 mg by mouth at bedtime.  . [DISCONTINUED] potassium chloride (KLOR-CON) 8 MEQ tablet Take 8 mEq by mouth daily.  . [DISCONTINUED] torsemide (DEMADEX) 20 MG tablet Take 1 tablet (20 mg total) by mouth daily.     Allergies:   Patient has no known allergies.   Social History   Socioeconomic History  . Marital status: Married    Spouse name: Not on file  . Number of children: Not on file  . Years of education: Not on file  . Highest education level: Not on file  Occupational History  . Occupation: retired  Tobacco Use  . Smoking status: Former Smoker    Packs/day: 1.00    Years: 30.00    Pack years: 30.00    Quit date: 1975    Years since quitting: 47.2  . Smokeless tobacco: Never Used  Vaping Use  . Vaping Use: Never used   Substance and Sexual Activity  . Alcohol use: Not Currently  . Drug use: Never  . Sexual activity: Not on file  Other Topics Concern  . Not on file  Social History Narrative  . Not on file   Social Determinants of Health   Financial Resource Strain: Not on file  Food Insecurity: No Food Insecurity  . Worried About Charity fundraiser in the Last Year: Never true  . Ran Out of Food in the Last Year: Never true  Transportation Needs: No Transportation Needs  . Lack of Transportation (Medical): No  . Lack of Transportation (Non-Medical): No  Physical Activity: Not on file  Stress: Not on file  Social Connections: Not on file     Family History: The patient's family history is not on file.  ROS:   Please see the history of present illness.    All other systems reviewed and are negative.  EKGs/Labs/Other Studies Reviewed:    The following studies were reviewed today:  ECHOCARDIOGRAM REPORT    Patient Name:  Ruben [name] Date of Exam: 07/16/2020  Medical Rec #: [id number] Height:    67.0 in  Accession #:  [id number] Weight:    179.2 lb  Date of Birth: 01/30/1927  BSA:     1.930 m  Patient Age:  73 years  BP:      94/56 mmHg  Patient Gender: M     HR:      71 bpm.  Exam Location: Inpatient   Procedure: 2D Echo, Cardiac Doppler and Color Doppler   Indications:  I50.33 Acute on chronic diastolic (congestive) heart  failure    History:    Patient has no prior history of Echocardiogram  examinations.         COPD; Arrythmias:Atrial Fibrillation.    Sonographer:  Jonelle Sidle Dance  Referring Phys: [place]    1. Left ventricular ejection fraction, by estimation, is 55 to 60%. The  left ventricle has normal function. The left ventricle has no regional  wall motion abnormalities. There is moderate left ventricular hypertrophy.  Left ventricular diastolic  parameters are indeterminate.  2.  Right ventricular systolic function is moderately reduced. The right  ventricular size is mildly enlarged. There is mildly elevated pulmonary  artery systolic pressure.  3. Left atrial size was moderately dilated.  4. Mild functional mitral stenosis due to severe MAC. The  mitral valve is  degenerative. Mild mitral valve regurgitation. Mild mitral stenosis.  Severe mitral annular calcification.  5. Tricuspid valve regurgitation is moderate.  6. The aortic valve is tricuspid. There is severe calcifcation of the  aortic valve. There is severe thickening of the aortic valve. Aortic valve  regurgitation is not visualized. Severe aortic valve stenosis.  7. The inferior vena cava is normal in size with greater than 50%  respiratory variability, suggesting right atrial pressure of 3 mmHg.   FINDINGS  Left Ventricle: Left ventricular ejection fraction, by estimation, is 55  to 60%. The left ventricle has normal function. The left ventricle has no  regional wall motion abnormalities. The left ventricular internal cavity  size was normal in size. There is  moderate left ventricular hypertrophy. Left ventricular diastolic  parameters are indeterminate.   Right Ventricle: The right ventricular size is mildly enlarged. No  increase in right ventricular wall thickness. Right ventricular systolic  function is moderately reduced. There is mildly elevated pulmonary artery  systolic pressure. The tricuspid  regurgitant velocity is 3.08 m/s, and with an assumed right atrial  pressure of 3 mmHg, the estimated right ventricular systolic pressure is  29.9 mmHg.   Left Atrium: Left atrial size was moderately dilated.   Right Atrium: Right atrial size was normal in size.   Pericardium: There is no evidence of pericardial effusion.   Mitral Valve: Mild functional mitral stenosis due to severe MAC. The  mitral valve is degenerative in appearance. There is moderate thickening  of the mitral valve  leaflet(s). There is moderate calcification of the  mitral valve leaflet(s). Severe mitral  annular calcification. Mild mitral valve regurgitation. Mild mitral valve  stenosis. MV peak gradient, 14.2 mmHg. The mean mitral valve gradient is  4.5 mmHg.   Tricuspid Valve: The tricuspid valve is normal in structure. Tricuspid  valve regurgitation is moderate . No evidence of tricuspid stenosis.   Aortic Valve: The aortic valve is tricuspid. There is severe calcifcation  of the aortic valve. There is severe thickening of the aortic valve. There  is severe aortic valve annular calcification. Aortic valve regurgitation  is not visualized. Severe aortic  stenosis is present. Aortic valve mean gradient measures 37.0 mmHg.  Aortic valve peak gradient measures 63.4 mmHg. Aortic valve area, by VTI  measures 0.83 cm.   Pulmonic Valve: The pulmonic valve was normal in structure. Pulmonic valve  regurgitation is mild. No evidence of pulmonic stenosis.   Aorta: The aortic root is normal in size and structure.   Venous: The inferior vena cava is normal in size with greater than 50%  respiratory variability, suggesting right atrial pressure of 3 mmHg.   IAS/Shunts: No atrial level shunt detected by color flow Doppler.     LEFT VENTRICLE  PLAX 2D  LVIDd:     5.10 cm  LVIDs:     3.10 cm  LV PW:     1.40 cm  LV IVS:    1.40 cm  LVOT diam:   2.40 cm  LV SV:     82  LV SV Index:  42  LVOT Area:   4.52 cm     RIGHT VENTRICLE      IVC  RV Basal diam: 3.50 cm  IVC diam: 1.60 cm  RV Mid diam:  3.00 cm  RV S prime:   8.59 cm/s  TAPSE (M-mode): 1.3 cm   LEFT ATRIUM       Index    RIGHT ATRIUM  Index  LA diam:    5.60 cm 2.90 cm/m  RA Area:   35.60 cm  LA Vol (A2C):  252.0 ml 130.56 ml/m RA Volume:  135.00 ml 69.94 ml/m  LA Vol (A4C):  185.0 ml 95.85 ml/m  LA Biplane Vol: 223.0 ml 115.53 ml/m  AORTIC VALVE  AV Area  (Vmax):  0.87 cm  AV Area (Vmean):  0.84 cm  AV Area (VTI):   0.83 cm  AV Vmax:      398.25 cm/s  AV Vmean:     284.750 cm/s  AV VTI:      0.980 m  AV Peak Grad:   63.4 mmHg  AV Mean Grad:   37.0 mmHg  LVOT Vmax:     76.75 cm/s  LVOT Vmean:    52.700 cm/s  LVOT VTI:     0.180 m  LVOT/AV VTI ratio: 0.18    AORTA  Ao Root diam: 3.60 cm  Ao Asc diam: 3.50 cm   MITRAL VALVE        TRICUSPID VALVE  MV Area (PHT): 1.67 cm   TR Peak grad:  37.9 mmHg  MV Peak grad: 14.2 mmHg  TR Vmax:    308.00 cm/s  MV Mean grad: 4.5 mmHg  MV Vmax:    1.88 m/s   SHUNTS  MV Vmean:   89.2 cm/s  Systemic VTI: 0.18 m  MV Decel Time: 454 msec   Systemic Diam: 2.40 cm  MV E velocity: 181.00 cm/s  MV A velocity: 58.20 cm/s  MV E/A ratio: 3.11   Jenkins Rouge MD  Electronically signed by Jenkins Rouge MD  Signature Date/Time: 07/16/2020/3:21:03 PM        RIGHT/LEFT HEART CATH AND CORONARY ANGIOGRAPHY    Conclusion    Prox RCA lesion is 40% stenosed.  RPDA lesion is 60% stenosed.  1st RPL lesion is 50% stenosed.  Mid LM to Dist LM lesion is 30% stenosed.  Prox LAD to Mid LAD lesion is 50% stenosed.  1st Diag lesion is 50% stenosed.  1. Diffuse calcific nonobstructive CAD 2. Severe calcific aortic stenosis with mean gradient 37 mmHg, aortic valve area 1.05 cm 3. Mild pulmonary hypertension  Recommendation: Continue multidisciplinary team evaluation for treatment of severe symptomatic aortic stenosis   Surgeon Notes    09/07/2020 12:10 PM CV Procedure signed by Geralynn Rile, MD    Indications  Severe aortic stenosis [I35.0 (ICD-10-CM)]   Procedural Details  Technical Details INDICATION: Severe, symptomatic aortic stenosis  PROCEDURAL DETAILS: There was an indwelling IV in a right antecubital vein. Using normal sterile technique, the IV was changed out for a 5 Fr  brachial sheath over a 0.018 inch wire. The right wrist was then prepped, draped, and anesthetized with 1% lidocaine. Ultrasound guidance is used for right radial artery access. Using the modified Seldinger technique a 5/6 French Slender sheath was placed in the right radial artery. Ultrasound images are digitally captured and stored in the patient's chart. Intra-arterial verapamil was administered through the radial artery sheath. IV heparin was administered after a JR4 catheter was advanced into the central aorta. A Swan-Ganz catheter was used for the right heart catheterization. Standard protocol was followed for recording of right heart pressures and sampling of oxygen saturations. Fick cardiac output was calculated. Standard Judkins catheters were used for selective coronary angiography. LV pressure is recorded and an aortic valve pullback is performed. An AL two catheter was used to direct a straight tip wire across the aortic  valve and pullback pressures were recorded. There were no immediate procedural complications. The patient was transferred to the post catheterization recovery area for further monitoring.    Estimated blood loss <50 mL.   During this procedure medications were administered to achieve and maintain moderate conscious sedation while the patient's heart rate, blood pressure, and oxygen saturation were continuously monitored and I was present face-to-face 100% of this time.   Medications (Filter: Administrations occurring from 1048 to 1154 on 08/24/20) (important) Continuous medications are totaled by the amount administered until 08/24/20 1154.    midazolam (VERSED) injection (mg) Total dose:  1 mg  Date/Time Rate/Dose/Volume Action   08/24/20 1107 1 mg Given    lidocaine (PF) (XYLOCAINE) 1 % injection (mL) Total volume:  4 mL  Date/Time Rate/Dose/Volume Action   08/24/20 1111 2 mL Given   1113 2 mL Given    Radial Cocktail/Verapamil only  (mL) Total volume:  10 mL  Date/Time Rate/Dose/Volume Action   08/24/20 1117 10 mL Given    heparin sodium (porcine) injection (Units) Total dose:  5,000 Units  Date/Time Rate/Dose/Volume Action   08/24/20 1128 5,000 Units Given    Heparin (Porcine) in NaCl 1000-0.9 UT/500ML-% SOLN (mL) Total volume:  1,000 mL  Date/Time Rate/Dose/Volume Action   08/24/20 1146 500 mL Given   1146 500 mL Given    iohexol (OMNIPAQUE) 350 MG/ML injection (mL) Total volume:  55 mL  Date/Time Rate/Dose/Volume Action   08/24/20 1153 55 mL Given    Sedation Time  Sedation Time Physician-1: 40 minutes 44 seconds   Contrast  Medication Name Total Dose  iohexol (OMNIPAQUE) 350 MG/ML injection 55 mL    Radiation/Fluoro  Fluoro time: 7.6 (min) DAP: 21229 (mGycm2) Cumulative Air Kerma: 336 (mGy)   Coronary Findings   Diagnostic Dominance: Right  Left Main  Mid LM to Dist LM lesion is 30% stenosed. The lesion is calcified.  Left Anterior Descending  Prox LAD to Mid LAD lesion is 50% stenosed. The lesion is moderately calcified.  First Diagonal Branch  1st Diag lesion is 50% stenosed.  Left Circumflex  There is mild diffuse disease throughout the vessel.  Right Coronary Artery  Vessel is large.  Prox RCA lesion is 40% stenosed. The lesion is moderately calcified.  Right Posterior Descending Artery  RPDA lesion is 60% stenosed. The lesion is calcified.  First Right Posterolateral Branch  1st RPL lesion is 50% stenosed.   Intervention   No interventions have been documented.  Left Heart  Mitral Valve The annulus is calcified.  Aortic Valve There is severe aortic valve stenosis. The aortic valve is calcified. There is restricted aortic valve motion. Severe aortic stenosis is present with peak to peak gradient 47 mmHg, mean gradient 37 mmHg, Peak instantaneous gradient 53 mmHg, calculated aortic valve area 1.05 cm   Coronary  Diagrams   Diagnostic Dominance: Right    Intervention    Implants    No implant documentation for this case.    Syngo Images  Show images for CARDIAC CATHETERIZATION  Images on Long Term Storage  Show images for [place], [place] to Procedure Log  Procedure Log     Hemo Data  Flowsheet Row Most Recent Value  Fick Cardiac Output 5.19 L/min  Fick Cardiac Output Index 2.67 (L/min)/BSA  Aortic Mean Gradient 36.89 mmHg  Aortic Peak Gradient 47 mmHg  Aortic Valve Area 1.05  Aortic Value Area Index 0.54 cm2/BSA  RA A Wave 9 mmHg  RA V Wave  10 mmHg  RA Mean 8 mmHg  RV Systolic Pressure 48 mmHg  RV Diastolic Pressure 4 mmHg  RV EDP 8 mmHg  PA Systolic Pressure 54 mmHg  PA Diastolic Pressure 16 mmHg  PA Mean 32 mmHg  PW A Wave 23 mmHg  PW V Wave 29 mmHg  PW Mean 22 mmHg  AO Systolic Pressure 97 mmHg  AO Diastolic Pressure 64 mmHg  AO Mean 78 mmHg  LV Systolic Pressure 161 mmHg  LV Diastolic Pressure 7 mmHg  LV EDP 16 mmHg  AOp Systolic Pressure 096 mmHg  AOp Diastolic Pressure 59 mmHg  AOp Mean Pressure 77 mmHg  LVp Systolic Pressure 045 mmHg  LVp Diastolic Pressure 4 mmHg  LVp EDP Pressure 13 mmHg  QP/QS 1  TPVR Index 12 HRUI  TSVR Index 29.24 HRUI  PVR SVR Ratio 0.14  TPVR/TSVR Ratio 0.41        TRANSESOPHOGEAL ECHO REPORT       Patient Name:  Ruben [name] Date of Exam: 09/07/2020  Medical Rec #: [id number] Height:    66.0 in  Accession #:  [id number] Weight:    186.5 lb  Date of Birth: Mar 19, 1927  BSA:     1.941 m  Patient Age:  89 years  BP:      132/79 mmHg  Patient Gender: M     HR:      82 bpm.  Exam Location: Outpatient   Procedure: 3D Echo, Transesophageal Echo, Cardiac Doppler, Color Doppler  and       Saline Contrast Bubble Study   Indications:   I35.0 Nonrheumatic aortic (valve) stenosis    History:     Patient has prior history of Echocardiogram  examinations,  most          recent 07/16/2020. CHF, COPD, Aortic Valve Disease;          Arrythmias:Atrial Fibrillation and RBBB.    Sonographer:   Tiffany Dance  Referring Phys: [id number] Eileen Stanford  Diagnosing Phys: Eleonore Chiquito MD   PROCEDURE: After discussion of the risks and benefits of a TEE, an  informed consent was obtained from the patient. TEE procedure time was 40  minutes. The transesophogeal probe was passed without difficulty through  the esophogus of the patient. Local  oropharyngeal anesthetic was provided with Cetacaine. Sedation performed  by different physician. The patient was monitored while under deep  sedation. Anesthestetic sedation was provided intravenously by  Anesthesiology: 370m of Propofol, 33mof  Lidocaine. Image quality was excellent. The patient's vital signs;  including heart rate, blood pressure, and oxygen saturation; remained  stable throughout the procedure. The patient developed no complications  during the procedure.   IMPRESSIONS    1. The MV is degenerative with a small flail P2 segment. 2D VC 0.34 cm,  2D ERO 0.27 cm2 (79 degrees), R vol 28 cc. 3D VCA 0.13 cm2. The flail gap  is 0.21 cm and flail width 0.52 cm. The jet is eccentric and there is  systolic pulmonary flow reversal in  the left upper pulmonary vein, but due to the small flail segment, this is  not likely hemodynamically significant. There is mild mitral stenosis with  3D MVA 2.3 cm2. MG 3.8 mmHG @ 78 bpm. The mitral valve is degenerative.  Mild to moderate mitral valve  regurgitation. Mild mitral stenosis. The mean mitral valve gradient is 3.8  mmHg with average heart rate of 75 bpm. Severe mitral annular  calcification.  2. The bubble study was  positive after 6 cardiac cycles, indicating there  is an intrapulmonary shunt. There is no interatrial shunt. Agitated saline  contrast bubble study was positive with shunting observed after >6  cardiac  cycles suggestive of  intrapulmonary shunting.  3. The aortic valve is tricuspid. There is severe calcifcation of the  aortic valve. There is severe thickening of the aortic valve. Aortic valve  regurgitation is mild. Severe aortic valve stenosis. Aortic valve area, by  VTI measures 0.52 cm. Aortic  valve Vmax measures 3.94 m/s.  4. Left ventricular ejection fraction, by estimation, is 50 to 55%. The  left ventricle has low normal function. The left ventricle has no regional  wall motion abnormalities.  5. Right ventricular systolic function is mildly reduced. The right  ventricular size is moderately enlarged.  6. Left atrial size was severely dilated. No left atrial/left atrial  appendage thrombus was detected. The LAA emptying velocity was 32 cm/s.  7. Right atrial size was severely dilated.  8. The tricuspid valve is abnormal. Tricuspid valve regurgitation is  moderate to severe.  9. Pulmonic valve regurgitation is moderate.  10. Severely dilated pulmonary artery.   FINDINGS  Left Ventricle: Left ventricular ejection fraction, by estimation, is 50  to 55%. The left ventricle has low normal function. The left ventricle has  no regional wall motion abnormalities. The left ventricular internal  cavity size was normal in size.   Right Ventricle: The right ventricular size is moderately enlarged. No  increase in right ventricular wall thickness. Right ventricular systolic  function is mildly reduced.   Left Atrium: Left atrial size was severely dilated. No left atrial/left  atrial appendage thrombus was detected. The LAA emptying velocity was 32  cm/s.   Right Atrium: Right atrial size was severely dilated.   Pericardium: There is no evidence of pericardial effusion.   Mitral Valve: The MV is degenerative with a small flail P2 segment. 2D VC  0.34 cm, 2D ERO 0.27 cm2 (79 degrees), R vol 28 cc. 3D VCA 0.13 cm2. The  flail gap is 0.21 cm and flail width 0.52  cm. The jet is eccentric and  there is systolic pulmonary flow  reversal in the left upper pulmonary vein, but due to the small flail  segment, this is not likely hemodynamically significant. There is mild  mitral stenosis with 3D MVA 2.3 cm2. MG 3.8 mmHG @ 78 bpm. The mitral  valve is degenerative in appearance. Severe  mitral annular calcification. Mild to moderate mitral valve  regurgitation. Mild mitral valve stenosis. The mean mitral valve gradient  is 3.8 mmHg with average heart rate of 75 bpm.   Tricuspid Valve: The tricuspid valve is abnormal. Tricuspid valve  regurgitation is moderate to severe. No evidence of tricuspid stenosis.   Aortic Valve: The aortic valve is tricuspid. There is severe calcifcation  of the aortic valve. There is severe thickening of the aortic valve.  Aortic valve regurgitation is mild. Severe aortic stenosis is present.  Aortic valve mean gradient measures 38.1  mmHg. Aortic valve peak gradient measures 62.1 mmHg. Aortic valve area,  by VTI measures 0.52 cm.   Pulmonic Valve: The pulmonic valve was grossly normal. Pulmonic valve  regurgitation is moderate. No evidence of pulmonic stenosis.   Aorta: The aortic root and ascending aorta are structurally normal, with  no evidence of dilitation.   Pulmonary Artery: The pulmonary artery is severely dilated.   Venous: A pattern of systolic flow reversal, suggestive of severe mitral  regurgitation is  recorded from the left upper pulmonary vein.   IAS/Shunts: The atrial septum is grossly normal. Agitated saline contrast  was given intravenously to evaluate for intracardiac shunting. Agitated  saline contrast bubble study was positive with shunting observed after >6  cardiac cycles suggestive of  intrapulmonary shunting. The bubble study was positive after 6 cardiac  cycles, indicating there is an intrapulmonary shunt. There is no  interatrial shunt.     LEFT VENTRICLE  PLAX 2D  LVOT diam:   2.20  cm  LV SV:     53  LV SV Index:  27  LVOT Area:   3.80 cm     AORTIC VALVE  AV Area (Vmax):  0.61 cm  AV Area (Vmean):  0.59 cm  AV Area (VTI):   0.52 cm  AV Vmax:      394.08 cm/s  AV Vmean:     294.660 cm/s  AV VTI:      1.032 m  AV Peak Grad:   62.1 mmHg  AV Mean Grad:   38.1 mmHg  LVOT Vmax:     62.82 cm/s  LVOT Vmean:    46.070 cm/s  LVOT VTI:     0.140 m  LVOT/AV VTI ratio: 0.14    AORTA  Ao Root diam: 2.60 cm  Ao Asc diam: 3.06 cm   MITRAL VALVE         TRICUSPID VALVE  MV Area VTI: 1.45 cm    TR Peak grad:  44.9 mmHg  MV Mean grad: 3.8 mmHg    TR Vmax:    335.00 cm/s  MV VTI:    0.37 m  MR Peak grad:  46.2 mmHg  SHUNTS  MR Vmax:     339.91 cm/s Systemic VTI: 0.14 m  MR PISA:     9.05 cm  Systemic Diam: 2.20 cm  MR PISA Eff ROA: 103 mm  MR PISA Radius: 1.20 cm   Eleonore Chiquito MD  Electronically signed by Eleonore Chiquito MD  Signature Date/Time: 09/07/2020/9:20:02 PM     Cardiac TAVR CT  TECHNIQUE: The patient was scanned on a [employer]. A 100 kV retrospective scan was triggered in the descending thoracic aorta at 111 HU's. Gantry rotation speed was 250 msecs and collimation was .6 mm. No beta blockade or nitro were given. The 3D data set was reconstructed in 5% intervals of the R-R cycle. Systolic and diastolic phases were analyzed on a dedicated work station using MPR, MIP and VRT modes. The patient received 80 cc of contrast.  FINDINGS: Image quality: Average. The entire left ventricle was not imaged.  Noise artifact is: Moderate cardiac motion artifact. Millisecond reconstruction performed.  Valve Morphology: The aortic valve is tricuspid with diffuse severe calcifications. The leaflets are severe restricted in systole consistent with severe aortic stenosis.  Aortic Valve Calcium score: 4577  Aortic annular dimension:  Phase  assessed: 200 ms  Annular area: 448 mm2  Annular perimeter: 75.9 mm  Max diameter: 26.8 mm  Min diameter: 21.9 mm  Annular and subannular calcification: No annular calcification. There is minimal subannular calcification under the [place].  Optimal coplanar projection: LAO 27 CAU 4  Coronary Artery Height above Annulus:  Left Main: 12.6 mm  Right Coronary: 17.0 mm  Sinus of Valsalva Measurements:  Non-coronary: 30 mm  Right-coronary: 31 mm  Left-coronary: 31 mm  Sinus of Valsalva Height:  Non-coronary: 23.0 mm  Right-coronary: 21.3 mm  Left-coronary: 19.8 mm  Sinotubular Junction: 28 mm  Ascending  Thoracic Aorta: 34 mm  Coronary Arteries: Normal coronary origin. Right dominance. The study was performed without use of NTG and is insufficient for plaque evaluation. Please refer to recent cardiac catheterization for coronary assessment. 3-vessel calcifications noted.  Cardiac Morphology:  Right Atrium: Right atrial size is dilated.  Right Ventricle: The right ventricular cavity is dilated.  Left Atrium: Left atrial size is dilated in size. There is contrast mixing artifact in the LAA.  Left Ventricle: The ventricular cavity size is within normal limits and there are no stigmata of prior infarction in the visualized portion.  Pulmonary arteries: Dilated in size without proximal filling defect. Findings suggestive of pulmonary hypertension.  Pulmonary veins: Normal pulmonary venous drainage.  Pericardium: Normal thickness with no significant effusion or calcium present.  Mitral Valve: The mitral valve is degenerative with severe mitral annular calcification.  Extra-cardiac findings: See attached radiology report for non-cardiac structures.  IMPRESSION: 1. Trileaflet aortic valve with severe aortic stenosis (calcium score 4577).  2. Cardiac motion artifact present and annular measurements performed using 200 ms  reconstruction.  3. Annular measurements favorable for 26 mm Edwards Sapien 3 TAVR (448 mm2).  4. No significant annular calcification. Minimal subannular calcification under the [place].  5. Sufficient coronary to annulus distance.  6. Optimal Fluoroscopic Angle for Delivery: LAO 27 CAU 4  7. Dilated pulmonary artery suggestive of pulmonary hypertension.  8. Contrast mixing artifact is present in the LAA.  9. Severe mitral annular calcification.  [place] T. Audie Box, MD   Electronically Signed By: Eleonore Chiquito On: 08/30/2020 18:12    CT ANGIOGRAPHY CHEST, ABDOMEN AND PELVIS  TECHNIQUE: Non-contrast CT of the chest was initially obtained.  Multidetector CT imaging through the chest, abdomen and pelvis was performed using the standard protocol during bolus administration of intravenous contrast. Multiplanar reconstructed images and MIPs were obtained and reviewed to evaluate the vascular anatomy.  CONTRAST: 48m OMNIPAQUE IOHEXOL 350 MG/ML SOLN  COMPARISON: No priors.  FINDINGS: CTA CHEST FINDINGS  Cardiovascular: Heart size is enlarged with biatrial dilatation. There is no significant pericardial fluid, thickening or pericardial calcification. There is aortic atherosclerosis, as well as atherosclerosis of the great vessels of the mediastinum and the coronary arteries, including calcified atherosclerotic plaque in the left main, left anterior descending, left circumflex and right coronary arteries. Severe thickening calcification of the aortic valve. Severe calcifications of the mitral annulus. Dilatation of the pulmonic trunk (4.1 cm in diameter).  Mediastinum/Lymph Nodes: No pathologically enlarged mediastinal or hilar lymph nodes. Multiple densely calcified right hilar and mediastinal lymph nodes are incidentally noted. Esophagus is unremarkable in appearance. No axillary lymphadenopathy.  Lungs/Pleura: Moderate bilateral pleural  effusions lying dependently with areas of passive subsegmental atelectasis in the lower lobes of the lungs bilaterally. In the right lower lobe (axial image 90 of series 6 and coronal image 101 of series 8) there is an elongated nodular density measuring 1.4 x 2.1 x 2.8 cm which appears associated with a pulmonary artery branch to the right lower lobe, and demonstrates enhancement characteristics similar to adjacent vasculature, suspicious for a pulmonary arteriovenous malformation. No other definite suspicious appearing pulmonary nodules or masses are noted. No acute consolidative airspace disease. Mild diffuse bronchial wall thickening with mild centrilobular and paraseptal emphysema.  Musculoskeletal/Soft Tissues: There are no aggressive appearing lytic or blastic lesions noted in the visualized portions of the skeleton.  CTA ABDOMEN AND PELVIS FINDINGS  Hepatobiliary: No suspicious cystic or solid hepatic lesions. No intra or extrahepatic biliary ductal dilatation. Calcified gallstone measuring  1.1 cm in diameter in the dependent portion of the gallbladder. No findings to suggest an acute cholecystitis at this time.  Pancreas: No pancreatic mass. No pancreatic ductal dilatation. No pancreatic or peripancreatic fluid collections or inflammatory changes.  Spleen: Unremarkable.  Adrenals/Urinary Tract: Low-attenuation lesions in the left kidney, largest of which is in the interpolar region where there is an exophytic 5.2 cm lesion, compatible with simple cysts. Other subcentimeter low-attenuation lesions in the right kidney are too small to definitively characterize, but statistically likely to represent tiny cysts. No aggressive appearing renal lesions. No hydroureteronephrosis. Urinary bladder is nearly decompressed, but otherwise unremarkable in appearance.  Stomach/Bowel: The appearance of the stomach is normal. No pathologic dilatation of small bowel or colon.  Short segment of distal small bowel extends into a small right inguinal hernia. The appendix is not confidently identified and may be surgically absent. Regardless, there are no inflammatory changes noted adjacent to the cecum to suggest the presence of an acute appendicitis at this time.  Vascular/Lymphatic: Aortic atherosclerosis, with vascular findings and measurements pertinent to potential TAVR procedure, as detailed below. No aneurysm or dissection noted in the abdominal or pelvic vasculature. No lymphadenopathy noted in the abdomen or pelvis.  Reproductive: Prostate gland is enlarged measuring up to 5.2 x 4.1 x 5.9 cm. Seminal vesicles are unremarkable in appearance.  Other: Small right inguinal hernia containing a short loop of distal small bowel (likely ileum). No significant volume of ascites. No pneumoperitoneum.  Musculoskeletal: There are no aggressive appearing lytic or blastic lesions noted in the visualized portions of the skeleton.  VASCULAR MEASUREMENTS PERTINENT TO TAVR:  AORTA:  Minimal Aortic Diameter-13 x 12 mm  Severity of Aortic Calcification-severe  RIGHT PELVIS:  Right Common Iliac Artery -  Minimal Diameter-8.6 x 9.7 mm  Tortuosity-mild  Calcification-moderate  Right External Iliac Artery -  Minimal Diameter-7.0 x 7.1 mm  Tortuosity-severe  Calcification-mild  Right Common Femoral Artery -  Minimal Diameter-8.4 x 4.3 mm  Tortuosity-mild  Calcification-moderate to severe  LEFT PELVIS:  Left Common Iliac Artery -  Minimal Diameter-9.5 x 9.2 mm  Tortuosity-mild  Calcification-moderate  Left External Iliac Artery -  Minimal Diameter-7.4 x 6.7 mm  Tortuosity-severe  Calcification-none  Left Common Femoral Artery -  Minimal Diameter-8.6 x 7.1 mm  Tortuosity-mild  Calcification-severe  Review of the MIP images confirms the above findings.  IMPRESSION: 1. Vascular findings and  measurements pertinent to potential TAVR procedure, as detailed above. 2. Severe thickening calcification of the aortic valve, compatible with reported clinical history of severe aortic stenosis. 3. Severe dilatation of the pulmonic trunk (4.1 cm in diameter), concerning for pulmonary arterial hypertension. Notably, there is also an elongated nodular density in the right lower lobe which has enhancement characteristics suggestive of a vascular lesions such as a pulmonary arteriovenous malformation. This could be confirmed with PE protocol CT scan if of clinical concern. 4. Severe calcifications of the mitral annulus. 5. Cardiomegaly with biatrial dilatation. 6. Moderate bilateral pleural effusions with areas of passive subsegmental atelectasis in the dependent portions of the lower lobes of the lungs bilaterally. 7. Small right inguinal hernia containing a short segment of distal small bowel, without evidence of bowel incarceration or obstruction at this time. 8. Cholelithiasis without evidence of acute cholecystitis. 9. Prostatomegaly. 10. Additional incidental findings, as above.     EKG:  EKG is NOT ordered today.   Recent Labs: 07/15/2020: B Natriuretic Peptide 487.9; TSH 0.398 09/20/2020: ALT 12 09/21/2020: BUN 26; Creatinine, Ser 0.92;  Magnesium 1.9; Potassium 4.4; Sodium 138 09/23/2020: Hemoglobin 9.7; Platelets 140  Recent Lipid Panel No results found for: CHOL, TRIG, HDL, CHOLHDL, VLDL, LDLCALC, LDLDIRECT   Risk Assessment/Calculations:    CHA2DS2-VASc Score = 5  This indicates a 7.2% annual risk of stroke. The patient's score is based upon: CHF History: Yes HTN History: Yes Diabetes History: Yes Stroke History: No Vascular Disease History: No Age Score: 2 Gender Score: 0   Physical Exam:    VS:  BP 128/64   Pulse 95   Ht _0  (1.676 m)   Wt 212 lb 6.4 oz (96.3 kg)   SpO2 98%   BMI 34.28 kg/m     Wt Readings from Last 3 Encounters:  10/12/20 212 lb  6.4 oz (96.3 kg)  09/22/20 193 lb 2 oz (87.6 kg)  09/16/20 192 lb 12.8 oz (87.5 kg)     GEN: Well nourished, well developed in no acute distress, in wheelchair HEENT: Normal NECK: mildly elevated JVD LYMPHATICS: No lymphadenopathy CARDIAC: irreg irrreg, 3/6 SEM @ RUSB and 2/6 holosystolic murmur. No rubs, gallops. 2+ bilateral pitting edema.  RESPIRATORY:  Decreased breath sounds at bases, mid bibasilar crackles ABDOMEN: Soft, non-tender, non-distended MUSCULOSKELETAL:  SKIN: Warm and dry NEUROLOGIC:  Alert and oriented x 3 PSYCHIATRIC:  Normal affect   ASSESSMENT:    1. Severe aortic stenosis   2. Acute on chronic diastolic heart failure ([place])   3. Chronic atrial fibrillation ([place])   4. Type III open fracture of left hip with routine healing, subsequent encounter    PLAN:    In order of problems listed above:  Severe AS: has known severe AS and planning for TAVR in near future when euvolemic and a little more stable from a functional standpoint.   Acute on chronic diastolic CHF: weight up ~ 20 lbs. Evidence of volume overload with LE edema, elevated JVD and crackles at bases. Will plan to get BMET and BNP. Increase torsemide from 63m daily to 662mx2 days followed by 4084maily. Increase Kdur to 83m5maily. Will follow him closely and see him back in office next week.   Chronic afib: rate well controlled. No changes made. Continue on chronic coumadin.     Hip fracture s/p ORIF: still having some pain. Started on steroids last week. In rehab at [facility].   Medication Adjustments/Labs and Tests Ordered: Current medicines are reviewed at length with the patient today.  Concerns regarding medicines are outlined above.  Orders Placed This Encounter  Procedures  . Basic metabolic panel  . Pro b natriuretic peptide   Meds ordered this encounter  Medications  . torsemide (DEMADEX) 20 MG tablet    Sig: Take 3 tablets (60 mg total) by mouth daily x 2 days only, then take 2  tablets (40 mg total) by mouth daily thereafter.    Dispense:  66 tablet    Refill:  0  . potassium chloride SA (KLOR-CON) 20 MEQ tablet    Sig: Take 1 tablet (20 mEq total) by mouth daily.    Dispense:  30 tablet    Refill:  2    Patient Instructions  Medication Instructions:   INCREASE YOUR TORSEMIDE TO TAKING 60 MG BY MOUTH DAILY FOR 2 DAYS ONLY, THEN TAKE 40 MG BY MOUTH DAILY THEREAFTER.   INCREASE YOUR POTASSIUM CHLORIDE TO 20 mEq BY MOUTH DAILY  *If you need a refill on your cardiac medications before your next appointment, please call your pharmacy*   Lab  Work:  TODAY--BMET AND PRO-BNP  If you have labs (blood work) drawn today and your tests are completely normal, you will receive your results only by: Marland Kitchen MyChart Message (if you have MyChart) OR . A paper copy in the mail If you have any lab test that is abnormal or we need to change your treatment, we will call you to review the results.   Follow-Up:  Angelena Form PA-C IN THE OFFICE NEXT Thursday 10/20/20 AT 1:30 PM.       Mable Fill, PA-C  10/12/2020 3:10 PM    [facility]

## 2020-10-12 NOTE — Progress Notes (Signed)
He doesn't seem to be too uncomfortable currently. I'll see how he does next week and can admit him from the office if necessary. He knows to call us if he goes in the other direction over the next week

## 2020-10-13 ENCOUNTER — Emergency Department (HOSPITAL_COMMUNITY): Payer: Medicare Other

## 2020-10-13 ENCOUNTER — Inpatient Hospital Stay (HOSPITAL_COMMUNITY)
Admission: EM | Admit: 2020-10-13 | Discharge: 2020-10-17 | DRG: 292 | Disposition: A | Discharge status: Skilled Nursing Facility | Payer: Medicare Other | Source: Skilled Nursing Facility | Attending: Internal Medicine | Admitting: Internal Medicine

## 2020-10-13 ENCOUNTER — Inpatient Hospital Stay (HOSPITAL_COMMUNITY): Payer: Medicare Other

## 2020-10-13 ENCOUNTER — Other Ambulatory Visit: Payer: Self-pay

## 2020-10-13 ENCOUNTER — Telehealth: Payer: Self-pay

## 2020-10-13 DIAGNOSIS — I4819 Other persistent atrial fibrillation: Secondary | ICD-10-CM | POA: Diagnosis present

## 2020-10-13 DIAGNOSIS — R0902 Hypoxemia: Secondary | ICD-10-CM

## 2020-10-13 DIAGNOSIS — Z9981 Dependence on supplemental oxygen: Secondary | ICD-10-CM | POA: Diagnosis not present

## 2020-10-13 DIAGNOSIS — Z7901 Long term (current) use of anticoagulants: Secondary | ICD-10-CM

## 2020-10-13 DIAGNOSIS — J9611 Chronic respiratory failure with hypoxia: Secondary | ICD-10-CM | POA: Diagnosis present

## 2020-10-13 DIAGNOSIS — J431 Panlobular emphysema: Secondary | ICD-10-CM | POA: Diagnosis not present

## 2020-10-13 DIAGNOSIS — Z87891 Personal history of nicotine dependence: Secondary | ICD-10-CM | POA: Diagnosis not present

## 2020-10-13 DIAGNOSIS — I5033 Acute on chronic diastolic (congestive) heart failure: Secondary | ICD-10-CM | POA: Diagnosis present

## 2020-10-13 DIAGNOSIS — S72142D Displaced intertrochanteric fracture of left femur, subsequent encounter for closed fracture with routine healing: Secondary | ICD-10-CM

## 2020-10-13 DIAGNOSIS — Z7951 Long term (current) use of inhaled steroids: Secondary | ICD-10-CM

## 2020-10-13 DIAGNOSIS — I509 Heart failure, unspecified: Secondary | ICD-10-CM | POA: Insufficient documentation

## 2020-10-13 DIAGNOSIS — Z79891 Long term (current) use of opiate analgesic: Secondary | ICD-10-CM

## 2020-10-13 DIAGNOSIS — I080 Rheumatic disorders of both mitral and aortic valves: Secondary | ICD-10-CM | POA: Diagnosis present

## 2020-10-13 DIAGNOSIS — W19XXXD Unspecified fall, subsequent encounter: Secondary | ICD-10-CM | POA: Diagnosis present

## 2020-10-13 DIAGNOSIS — N400 Enlarged prostate without lower urinary tract symptoms: Secondary | ICD-10-CM | POA: Diagnosis present

## 2020-10-13 DIAGNOSIS — I482 Chronic atrial fibrillation, unspecified: Secondary | ICD-10-CM | POA: Diagnosis not present

## 2020-10-13 DIAGNOSIS — Z79899 Other long term (current) drug therapy: Secondary | ICD-10-CM

## 2020-10-13 DIAGNOSIS — Z96653 Presence of artificial knee joint, bilateral: Secondary | ICD-10-CM | POA: Diagnosis present

## 2020-10-13 DIAGNOSIS — Z20822 Contact with and (suspected) exposure to covid-19: Secondary | ICD-10-CM | POA: Diagnosis present

## 2020-10-13 DIAGNOSIS — J449 Chronic obstructive pulmonary disease, unspecified: Secondary | ICD-10-CM | POA: Diagnosis present

## 2020-10-13 DIAGNOSIS — Z66 Do not resuscitate: Secondary | ICD-10-CM | POA: Diagnosis present

## 2020-10-13 DIAGNOSIS — M1990 Unspecified osteoarthritis, unspecified site: Secondary | ICD-10-CM | POA: Diagnosis present

## 2020-10-13 DIAGNOSIS — I350 Nonrheumatic aortic (valve) stenosis: Secondary | ICD-10-CM

## 2020-10-13 DIAGNOSIS — I5043 Acute on chronic combined systolic (congestive) and diastolic (congestive) heart failure: Secondary | ICD-10-CM | POA: Diagnosis not present

## 2020-10-13 LAB — COMPREHENSIVE METABOLIC PANEL
ALT: 16 U/L (ref 0–44)
AST: 19 U/L (ref 15–41)
Albumin: 2.8 g/dL — ABNORMAL LOW (ref 3.5–5.0)
Alkaline Phosphatase: 142 U/L — ABNORMAL HIGH (ref 38–126)
Anion gap: 6 (ref 5–15)
BUN: 25 mg/dL — ABNORMAL HIGH (ref 8–23)
CO2: 36 mmol/L — ABNORMAL HIGH (ref 22–32)
Calcium: 8.7 mg/dL — ABNORMAL LOW (ref 8.9–10.3)
Chloride: 96 mmol/L — ABNORMAL LOW (ref 98–111)
Creatinine, Ser: 0.93 mg/dL (ref 0.61–1.24)
GFR, Estimated: >60 mL/min (ref >60)
Glucose, Bld: 117 mg/dL — ABNORMAL HIGH (ref 70–99)
Potassium: 4.5 mmol/L (ref 3.5–5.1)
Sodium: 138 mmol/L (ref 135–145)
Total Bilirubin: 1.4 mg/dL — ABNORMAL HIGH (ref 0.3–1.2)
Total Protein: 6.5 g/dL (ref 6.5–8.1)

## 2020-10-13 LAB — URINALYSIS, ROUTINE W REFLEX MICROSCOPIC
Bacteria, UA: NONE SEEN
Bilirubin Urine: NEGATIVE
Glucose, UA: NEGATIVE mg/dL
Ketones, ur: NEGATIVE mg/dL
Leukocytes,Ua: NEGATIVE
Nitrite: NEGATIVE
Protein, ur: NEGATIVE mg/dL
Specific Gravity, Urine: 1.011 (ref 1.005–1.030)
pH: 6 (ref 5.0–8.0)

## 2020-10-13 LAB — CBC WITH DIFFERENTIAL/PLATELET
Abs Immature Granulocytes: 0.04 10*3/uL (ref 0.00–0.07)
Basophils Absolute: 0.1 10*3/uL (ref 0.0–0.1)
Basophils Relative: 1 %
Eosinophils Absolute: 0.3 10*3/uL (ref 0.0–0.5)
Eosinophils Relative: 4 %
HCT: 35.6 % — ABNORMAL LOW (ref 39.0–52.0)
Hemoglobin: 10.9 g/dL — ABNORMAL LOW (ref 13.0–17.0)
Immature Granulocytes: 1 %
Lymphocytes Relative: 8 %
Lymphs Abs: 0.7 10*3/uL (ref 0.7–4.0)
MCH: 31.1 pg (ref 26.0–34.0)
MCHC: 30.6 g/dL (ref 30.0–36.0)
MCV: 101.7 fL — ABNORMAL HIGH (ref 80.0–100.0)
Monocytes Absolute: 0.9 10*3/uL (ref 0.1–1.0)
Monocytes Relative: 11 %
Neutro Abs: 6.2 10*3/uL (ref 1.7–7.7)
Neutrophils Relative %: 75 %
Platelets: 333 10*3/uL (ref 150–400)
RBC: 3.5 MIL/uL — ABNORMAL LOW (ref 4.22–5.81)
RDW: 15.9 % — ABNORMAL HIGH (ref 11.5–15.5)
WBC: 8.2 10*3/uL (ref 4.0–10.5)
nRBC: 0 % (ref 0.0–0.2)

## 2020-10-13 LAB — BASIC METABOLIC PANEL
BUN/Creatinine Ratio: 36 — ABNORMAL HIGH (ref 10–24)
BUN: 32 mg/dL (ref 10–36)
CO2: 30 mmol/L — ABNORMAL HIGH (ref 20–29)
Calcium: 8.5 mg/dL — ABNORMAL LOW (ref 8.6–10.2)
Chloride: 95 mmol/L — ABNORMAL LOW (ref 96–106)
Creatinine, Ser: 0.9 mg/dL (ref 0.76–1.27)
Glucose: 106 mg/dL — ABNORMAL HIGH (ref 65–99)
Potassium: 5.3 mmol/L — ABNORMAL HIGH (ref 3.5–5.2)
Sodium: 138 mmol/L (ref 134–144)
eGFR: 81 mL/min/{1.73_m2} (ref >59)

## 2020-10-13 LAB — PRO B NATRIURETIC PEPTIDE: NT-Pro BNP: 7048 pg/mL — ABNORMAL HIGH (ref 0–486)

## 2020-10-13 LAB — MAGNESIUM: Magnesium: 2 mg/dL (ref 1.7–2.4)

## 2020-10-13 LAB — BRAIN NATRIURETIC PEPTIDE: B Natriuretic Peptide: 717.2 pg/mL — ABNORMAL HIGH (ref 0.0–100.0)

## 2020-10-13 LAB — SARS CORONAVIRUS 2 (TAT 6-24 HRS): SARS Coronavirus 2: NEGATIVE

## 2020-10-13 MED ORDER — FUROSEMIDE 10 MG/ML IJ SOLN
40.0000 mg | Freq: Once | INTRAMUSCULAR | Status: AC
  Administered 2020-10-13: 40 mg via INTRAVENOUS
  Filled 2020-10-13: qty 4

## 2020-10-13 MED ORDER — SODIUM CHLORIDE 0.9 % IV SOLN: 250.0000 mL | INTRAVENOUS | Status: DC | PRN

## 2020-10-13 MED ORDER — ONDANSETRON HCL 4 MG/2ML IJ SOLN: 4.0000 mg | Freq: Four times a day (QID) | INTRAMUSCULAR | Status: DC | PRN

## 2020-10-13 MED ORDER — ACETAMINOPHEN 325 MG PO TABS
650.0000 mg | ORAL_TABLET | ORAL | Status: DC | PRN
  Administered 2020-10-13 – 2020-10-15 (×3): 650 mg via ORAL
  Filled 2020-10-13 (×3): qty 2

## 2020-10-13 MED ORDER — FUROSEMIDE 10 MG/ML IJ SOLN
40.0000 mg | Freq: Two times a day (BID) | INTRAMUSCULAR | Status: DC
  Administered 2020-10-13 – 2020-10-16 (×6): 40 mg via INTRAVENOUS
  Filled 2020-10-13 (×6): qty 4

## 2020-10-13 MED ORDER — SODIUM CHLORIDE 0.9% FLUSH: 3.0000 mL | INTRAVENOUS | Status: DC | PRN

## 2020-10-13 MED ORDER — SODIUM CHLORIDE 0.9% FLUSH
3.0000 mL | Freq: Two times a day (BID) | INTRAVENOUS | Status: DC
  Administered 2020-10-14 – 2020-10-17 (×6): 3 mL via INTRAVENOUS

## 2020-10-13 NOTE — H&P (Signed)
History and Physical   Ruben [name] QAS:[id number] DOB: 12/14/1926 DOA: 10/13/2020  Referring MD/NP/PA: Dr. Jeraldine Loots  PCP: Clovis Riley, L.August Saucer, MD   Outpatient Specialists: Dr. Tonny Bollman, cardiology  Patient coming from: Skilled nursing facility  Chief Complaint: Shortness of breath  HPI: Ruben [name] is a 87 y.o. male with medical history significant of severe aortic stenosis, diastolic dysfunction CHF, COPD, persistent atrial fibrillation now on chronic Coumadin, history of severe right bundle branch block as recent fall with hip fracture in February who came to the ER today with significant shortness of breath cough and hypoxia.  Patient also noted worsening pedal edema.  He was in a rehab facility apparently after recent surgery.  His baseline oxygen requirement was 2 L now requiring up to 4 L.  His oxygen sat on the 2 L was only 84%.  Patient is also having what appears to be some elements of wheezing.  Denied any fever or chills.  Denied any exposure to COVID-19 infection.  Patient had a chest x-ray showing increased fluid consistent with congestive heart failure.  He is being admitted to the hospital for further evaluation and treatment..  ED Course: Temperature 97.7 blood pressure 149/99 pulse 114 respiratory oxygen sat 92% on 4 L.  White count 8.2 hemoglobin 10.9 and platelets 333.  Sodium 130 potassium 4.5 chloride 96 CO2 36 BUN 25 creatinine 0.93 and calcium 8.7.  Urinalysis essentially negative.  Initial chest x-ray shows pulmonary edema.  Patient initiated on diuretics and is being admitted to the hospital for further evaluation and treatment.  Review of Systems: As per HPI otherwise 10 point review of systems negative.    Past Medical History:  Diagnosis Date  . Arthritis   . CHF (congestive heart failure) (HCC)   . COPD (chronic obstructive pulmonary disease) (HCC)   . Mitral regurgitation   . Persistent atrial fibrillation (HCC)    on coumadin   . RBBB   . Severe aortic  stenosis     Past Surgical History:  Procedure Laterality Date  . APPENDECTOMY    . BUBBLE STUDY  09/07/2020   Procedure: BUBBLE STUDY;  Surgeon: Sande Rives, MD;  Location: [facility] ENDOSCOPY;  Service: Cardiovascular;;  . CARDIAC CATHETERIZATION    . CLAVICLE SURGERY    . INTRAMEDULLARY (IM) NAIL INTERTROCHANTERIC Left 09/20/2020   Procedure: INTRAMEDULLARY (IM) NAIL INTERTROCHANTRIC;  Surgeon: Sheral Apley, MD;  Location: [facility] OR;  Service: Orthopedics;  Laterality: Left;  . RIGHT/LEFT HEART CATH AND CORONARY ANGIOGRAPHY N/A 08/24/2020   Procedure: RIGHT/LEFT HEART CATH AND CORONARY ANGIOGRAPHY;  Surgeon: Tonny Bollman, MD;  Location: [facility] INVASIVE CV LAB;  Service: Cardiovascular;  Laterality: N/A;  . TEE WITHOUT CARDIOVERSION N/A 09/07/2020   Procedure: TRANSESOPHAGEAL ECHOCARDIOGRAM (TEE);  Surgeon: Sande Rives, MD;  Location: [facility] ENDOSCOPY;  Service: Cardiovascular;  Laterality: N/A;  . TOTAL KNEE ARTHROPLASTY Bilateral      reports that he quit smoking about 47 years ago. He has a 30.00 pack-year smoking history. He has never used smokeless tobacco. He reports previous alcohol use. He reports that he does not use drugs.  No Known Allergies  No family history on file.   Prior to Admission medications   Medication Sig Start Date End Date Taking? Authorizing Provider  acetaminophen (TYLENOL) 500 MG tablet Take 1 tablet (500 mg total) by mouth every 6 (six) hours as needed for mild pain or moderate pain. 09/22/20 10/22/20  Dorcas Carrow, MD  Camphor-Menthol-Methyl Sal (SALONPAS) 3.08-11-08 % PTCH Place  1 patch onto the skin daily as needed (muscle aches/pain.).    [provider]  diclofenac Sodium (VOLTAREN) 1 % GEL Apply 1 application topically 4 (four) times daily as needed (for pain).    [provider]  docusate sodium (COLACE) 100 MG capsule Take 1 capsule (100 mg total) by mouth 2 (two) times daily. 07/19/20   Osvaldo Shipper, MD  dutasteride  (AVODART) 0.5 MG capsule Take 0.5 mg by mouth daily. 05/21/20   [provider]  Eyelid Cleansers (EYESCRUB) PADS Place 1 each into both eyes daily as needed (dry eye).    [provider]  Glycerin-Polysorbate 80 (REFRESH DRY EYE THERAPY OP) Place 1 drop into both eyes daily as needed (Dry eyes).    [provider]  guaiFENesin (MUCINEX) 600 MG 12 hr tablet Take 600 mg by mouth daily as needed for to loosen phlegm.    [provider]  HYDROcodone-acetaminophen (NORCO/VICODIN) 5-325 MG tablet Take 1 tablet by mouth 3 (three) times daily as needed. 10/03/20   [provider]  levalbuterol Pauline Aus HFA) 45 MCG/ACT inhaler Inhale 2 puffs into the lungs every 4 (four) hours as needed for wheezing or shortness of breath.    [provider]  levalbuterol Pauline Aus) 0.63 MG/3ML nebulizer solution Take 0.63 mg by nebulization 2 (two) times daily.    [provider]  METAMUCIL FIBER PO Take 11.6 g by mouth daily as needed (Constipation).    [provider]  methocarbamol (ROBAXIN) 500 MG tablet Take 1 tablet (500 mg total) by mouth every 6 (six) hours as needed for muscle spasms. 09/22/20   Dorcas Carrow, MD  methylPREDNISolone (MEDROL DOSEPAK) 4 MG TBPK tablet Take by mouth. 10/07/20   [provider]  metoprolol succinate (TOPROL-XL) 50 MG 24 hr tablet Take 50 mg by mouth daily. 04/23/20   [provider]  montelukast (SINGULAIR) 10 MG tablet Take 10 mg by mouth daily. 04/19/20   [provider]  polyethylene glycol (MIRALAX / GLYCOLAX) 17 g packet Take 17 g by mouth daily as needed for mild constipation. 07/19/20   Osvaldo Shipper, MD  potassium chloride SA (KLOR-CON) 20 MEQ tablet Take 1 tablet (20 mEq total) by mouth daily. 10/12/20   Janetta Hora, PA-C  tamsulosin (FLOMAX) 0.4 MG CAPS capsule Take 0.8 mg by mouth at bedtime. 05/21/20   [provider]  tizanidine (ZANAFLEX) 2 MG capsule Take 2 mg by  mouth at bedtime as needed for muscle spasms. 03/23/20   [provider]  torsemide (DEMADEX) 20 MG tablet Take 3 tablets (60 mg total) by mouth daily x 2 days only, then take 2 tablets (40 mg total) by mouth daily thereafter. 10/12/20   Janetta Hora, PA-C  traMADol (ULTRAM) 50 MG tablet Take 50 mg by mouth daily as needed. 10/07/20   [provider]  warfarin (COUMADIN) 6 MG tablet Take 6 mg by mouth at bedtime. 06/23/20   [provider]    Physical Exam: Vitals:   10/13/20 1751 10/13/20 1752 10/13/20 1845 10/13/20 2217  BP:   124/82 (!) 133/98  Pulse:   99 (!) 114  Resp:   20 (!) 30  Temp:      TempSrc:      SpO2: 97%  95% 92%  Weight:  93.9 kg    Height:  5\' 6"  (1.676 m)        Constitutional: Chronically ill looking, no significant distress Vitals:   10/13/20 1751 10/13/20 1752 10/13/20  1845 10/13/20 2217  BP:   124/82 (!) 133/98  Pulse:   99 (!) 114  Resp:   20 (!) 30  Temp:      TempSrc:      SpO2: 97%  95% 92%  Weight:  93.9 kg    Height:  5\' 6"  (1.676 m)     Eyes: PERRL, lids and conjunctivae normal ENMT: Mucous membranes are moist. Posterior pharynx clear of any exudate or lesions.Normal dentition.  Neck: normal, supple, no masses, no thyromegaly Respiratory: clear to auscultation bilaterally, no wheezing, no crackles. Normal respiratory effort. No accessory muscle use.  Cardiovascular: Irregularly irregular with tachycardia, grade 4 out of 5 systolic ejection murmur,/ rubs / gallops.  1+ pedal edema. 2+ pedal pulses. No carotid bruits.  Abdomen: no tenderness, no masses palpated. No hepatosplenomegaly. Bowel sounds positive.  Musculoskeletal: no clubbing / cyanosis. No joint deformity upper and lower extremities. Good ROM, no contractures. Normal muscle tone.  Skin: no rashes, lesions, ulcers. No induration Neurologic: CN 2-12 grossly intact. Sensation intact, DTR normal. Strength 5/5 in all 4.  Psychiatric: Normal judgment and  insight. Alert and oriented x 3. Normal mood.     Labs on Admission: I have personally reviewed following labs and imaging studies  CBC: Recent Labs  Lab 10/13/20 1705  WBC 8.2  NEUTROABS 6.2  HGB 10.9*  HCT 35.6*  MCV 101.7*  PLT 333   Basic Metabolic Panel: Recent Labs  Lab 10/12/20 1458 10/13/20 1705  NA 138 138  K 5.3* 4.5  CL 95* 96*  CO2 30* 36*  GLUCOSE 106* 117*  BUN 32 25*  CREATININE 0.90 0.93  CALCIUM 8.5* 8.7*  MG  --  2.0   GFR: Estimated Creatinine Clearance: 55.5 mL/min (by C-G formula based on SCr of 0.93 mg/dL). Liver Function Tests: Recent Labs  Lab 10/13/20 1705  AST 19  ALT 16  ALKPHOS 142*  BILITOT 1.4*  PROT 6.5  ALBUMIN 2.8*   No results for input(s): LIPASE, AMYLASE in the last 168 hours. No results for input(s): AMMONIA in the last 168 hours. Coagulation Profile: No results for input(s): INR, PROTIME in the last 168 hours. Cardiac Enzymes: No results for input(s): CKTOTAL, CKMB, CKMBINDEX, TROPONINI in the last 168 hours. BNP (last 3 results) Recent Labs    10/12/20 1458  PROBNP 7,048*   HbA1C: No results for input(s): HGBA1C in the last 72 hours. CBG: No results for input(s): GLUCAP in the last 168 hours. Lipid Profile: No results for input(s): CHOL, HDL, LDLCALC, TRIG, CHOLHDL, LDLDIRECT in the last 72 hours. Thyroid Function Tests: No results for input(s): TSH, T4TOTAL, FREET4, T3FREE, THYROIDAB in the last 72 hours. Anemia Panel: No results for input(s): VITAMINB12, FOLATE, FERRITIN, TIBC, IRON, RETICCTPCT in the last 72 hours. Urine analysis:    Component Value Date/Time   COLORURINE YELLOW 10/13/2020 1705   APPEARANCEUR CLEAR 10/13/2020 1705   LABSPEC 1.011 10/13/2020 1705   PHURINE 6.0 10/13/2020 1705   GLUCOSEU NEGATIVE 10/13/2020 1705   HGBUR MODERATE (A) 10/13/2020 1705   BILIRUBINUR NEGATIVE 10/13/2020 1705   KETONESUR NEGATIVE 10/13/2020 1705   PROTEINUR NEGATIVE 10/13/2020 1705   NITRITE NEGATIVE  10/13/2020 1705   LEUKOCYTESUR NEGATIVE 10/13/2020 1705   Sepsis Labs: @LABRCNTIP (procalcitonin:4,lacticidven:4) ) Recent Results (from the past 240 hour(s))  SARS CORONAVIRUS 2 (TAT 6-24 HRS) Nasopharyngeal Nasopharyngeal Swab     Status: None   Collection Time: 10/13/20  7:22 PM   Specimen: Nasopharyngeal Swab  Result Value Ref Range Status  SARS Coronavirus 2 NEGATIVE NEGATIVE Final    Comment: (NOTE) SARS-CoV-2 target nucleic acids are NOT DETECTED.  The SARS-CoV-2 RNA is generally detectable in upper and lower respiratory specimens during the acute phase of infection. Negative results do not preclude SARS-CoV-2 infection, do not rule out co-infections with other pathogens, and should not be used as the sole basis for treatment or other patient management decisions. Negative results must be combined with clinical observations, patient history, and epidemiological information. The expected result is Negative.  Fact Sheet for Patients: HairSlick.nohttps://www.fda.gov/media/138098/download  Fact Sheet for Healthcare Providers: quierodirigir.comhttps://www.fda.gov/media/138095/download  This test is not yet approved or cleared by the [place] FDA and  has been authorized for detection and/or diagnosis of SARS-CoV-2 by FDA under an Emergency Use Authorization (EUA). This EUA will remain  in effect (meaning this test can be used) for the duration of the COVID-19 declaration under Se ction 564(b)(1) of the Act, 21 U.S.C. section 360bbb-3(b)(1), unless the authorization is terminated or revoked sooner.  Performed at [facility], [address] [place], [place] [address]      Radiological Exams on Admission: DG Chest Portable 1 View  Result Date: 10/13/2020 CLINICAL DATA:  Shortness of breath and lower extremity edema. EXAM: PORTABLE CHEST 1 VIEW COMPARISON:  October 13, 2020 (5:17 p.m.) FINDINGS: Stable moderate to marked severity diffusely increased interstitial lung markings are noted. A  small left pleural effusion is suspected. No pneumothorax is identified. The cardiac silhouette is markedly enlarged. Marked severity calcification of the aortic arch is seen. Degenerative changes are noted throughout the thoracic spine. IMPRESSION: Cardiomegaly with pulmonary edema, stable in severity when compared to the prior study. Electronically Signed   By: Aram Candelahaddeus  Houston M.D.   On: 10/13/2020 22:51   DG Chest Port 1 View  Result Date: 10/13/2020 CLINICAL DATA:  Shortness of breath.  Lower extremity edema. EXAM: PORTABLE CHEST 1 VIEW COMPARISON:  09/16/2020 FINDINGS: Chronic cardiomegaly. Unchanged mediastinal contours with aortic atherosclerosis. Interstitial thickening and Kerley B-lines consistent with pulmonary edema. Small bilateral pleural effusions are similar to prior exam. No pneumothorax. No evidence of pneumonia. IMPRESSION: 1. Pulmonary edema is new from last month. 2. Cardiomegaly and bilateral pleural effusions appear similar. Electronically Signed   By: Narda RutherfordMelanie  Sanford M.D.   On: 10/13/2020 17:58    EKG: Independently reviewed.  A. fib with a rate of 113  Assessment/Plan Principal Problem:   Acute on chronic congestive heart failure (HCC) Active Problems:   Atrial fibrillation, chronic (HCC)   Severe aortic stenosis   COPD (chronic obstructive pulmonary disease) (HCC)   BPH (benign prostatic hyperplasia)     #1 acute on chronic CHF exacerbation: Patient will be admitted.  Initiate IV diuretics.  Oxygenation.  Titrate down.  There may be a component of COPD exacerbation.  I will avoid steroids for now but have breathing treatments.  #2 COPD: Patient is mildly wheezing not moving air much.  He could be having acute exacerbation.  Continue close monitoring and treatment.  #3 A. fib with RVR: Continue his warfarin.  Resume home regimen once confirmed.  INR is 1.7 still subtherapeutic.  #4 severe aortic stenosis: Significant aortic stenosis murmur.  Follow-up with his  cardiologist.  #5 history of BPH: Appears stable.   DVT prophylaxis: Warfarin Code Status: DNR Family Communication: No family at bedside Disposition Plan: Back to skilled facility Consults called: None Admission status: Inpatient  Severity of Illness: The appropriate patient status for this patient is INPATIENT. Inpatient status is  judged to be reasonable and necessary in order to provide the required intensity of service to ensure the patient's safety. The patient's presenting symptoms, physical exam findings, and initial radiographic and laboratory data in the context of their chronic comorbidities is felt to place them at high risk for further clinical deterioration. Furthermore, it is not anticipated that the patient will be medically stable for discharge from the hospital within 2 midnights of admission. The following factors support the patient status of inpatient.   " The patient's presenting symptoms include shortness of breath. " The worrisome physical exam findings include increased edema. " The initial radiographic and laboratory data are worrisome because of evidence of CHF. " The chronic co-morbidities include A. fib and CHF.   * I certify that at the point of admission it is my clinical judgment that the patient will require inpatient hospital care spanning beyond 2 midnights from the point of admission due to high intensity of service, high risk for further deterioration and high frequency of surveillance required.Lonia Blood MD [facility] Pager [phone]  If 7PM-7AM, please contact night-coverage [email or web address] Password [facility]  10/13/2020, 11:58 PM

## 2020-10-13 NOTE — H&P (Incomplete)
History and Physical   Ruben [name] QAS:[id number] DOB: 12/14/1926 DOA: 10/13/2020  Referring MD/NP/PA: Dr. Jeraldine Loots  PCP: Clovis Riley, L.August Saucer, MD   Outpatient Specialists: Dr. Tonny Bollman, cardiology  Patient coming from: Skilled nursing facility  Chief Complaint: Shortness of breath  HPI: Ruben [name] is a 87 y.o. male with medical history significant of severe aortic stenosis, diastolic dysfunction CHF, COPD, persistent atrial fibrillation now on chronic Coumadin, history of severe right bundle branch block as recent fall with hip fracture in February who came to the ER today with significant shortness of breath cough and hypoxia.  Patient also noted worsening pedal edema.  He was in a rehab facility apparently after recent surgery.  His baseline oxygen requirement was 2 L now requiring up to 4 L.  His oxygen sat on the 2 L was only 84%.  Patient is also having what appears to be some elements of wheezing.  Denied any fever or chills.  Denied any exposure to COVID-19 infection.  Patient had a chest x-ray showing increased fluid consistent with congestive heart failure.  He is being admitted to the hospital for further evaluation and treatment..  ED Course: Temperature 97.7 blood pressure 149/99 pulse 114 respiratory oxygen sat 92% on 4 L.  White count 8.2 hemoglobin 10.9 and platelets 333.  Sodium 130 potassium 4.5 chloride 96 CO2 36 BUN 25 creatinine 0.93 and calcium 8.7.  Urinalysis essentially negative.  Initial chest x-ray shows pulmonary edema.  Patient initiated on diuretics and is being admitted to the hospital for further evaluation and treatment.  Review of Systems: As per HPI otherwise 10 point review of systems negative.    Past Medical History:  Diagnosis Date  . Arthritis   . CHF (congestive heart failure) (HCC)   . COPD (chronic obstructive pulmonary disease) (HCC)   . Mitral regurgitation   . Persistent atrial fibrillation (HCC)    on coumadin   . RBBB   . Severe aortic  stenosis     Past Surgical History:  Procedure Laterality Date  . APPENDECTOMY    . BUBBLE STUDY  09/07/2020   Procedure: BUBBLE STUDY;  Surgeon: Sande Rives, MD;  Location: [facility] ENDOSCOPY;  Service: Cardiovascular;;  . CARDIAC CATHETERIZATION    . CLAVICLE SURGERY    . INTRAMEDULLARY (IM) NAIL INTERTROCHANTERIC Left 09/20/2020   Procedure: INTRAMEDULLARY (IM) NAIL INTERTROCHANTRIC;  Surgeon: Sheral Apley, MD;  Location: [facility] OR;  Service: Orthopedics;  Laterality: Left;  . RIGHT/LEFT HEART CATH AND CORONARY ANGIOGRAPHY N/A 08/24/2020   Procedure: RIGHT/LEFT HEART CATH AND CORONARY ANGIOGRAPHY;  Surgeon: Tonny Bollman, MD;  Location: [facility] INVASIVE CV LAB;  Service: Cardiovascular;  Laterality: N/A;  . TEE WITHOUT CARDIOVERSION N/A 09/07/2020   Procedure: TRANSESOPHAGEAL ECHOCARDIOGRAM (TEE);  Surgeon: Sande Rives, MD;  Location: [facility] ENDOSCOPY;  Service: Cardiovascular;  Laterality: N/A;  . TOTAL KNEE ARTHROPLASTY Bilateral      reports that he quit smoking about 47 years ago. He has a 30.00 pack-year smoking history. He has never used smokeless tobacco. He reports previous alcohol use. He reports that he does not use drugs.  No Known Allergies  No family history on file.   Prior to Admission medications   Medication Sig Start Date End Date Taking? Authorizing Provider  acetaminophen (TYLENOL) 500 MG tablet Take 1 tablet (500 mg total) by mouth every 6 (six) hours as needed for mild pain or moderate pain. 09/22/20 10/22/20  Dorcas Carrow, MD  Camphor-Menthol-Methyl Sal (SALONPAS) 3.08-11-08 % PTCH Place  1 patch onto the skin daily as needed (muscle aches/pain.).    [provider]  diclofenac Sodium (VOLTAREN) 1 % GEL Apply 1 application topically 4 (four) times daily as needed (for pain).    [provider]  docusate sodium (COLACE) 100 MG capsule Take 1 capsule (100 mg total) by mouth 2 (two) times daily. 07/19/20   Osvaldo ShipperKrishnan, Gokul, MD  dutasteride  (AVODART) 0.5 MG capsule Take 0.5 mg by mouth daily. 05/21/20   [provider]  Eyelid Cleansers (EYESCRUB) PADS Place 1 each into both eyes daily as needed (dry eye).    [provider]  Glycerin-Polysorbate 80 (REFRESH DRY EYE THERAPY OP) Place 1 drop into both eyes daily as needed (Dry eyes).    [provider]  guaiFENesin (MUCINEX) 600 MG 12 hr tablet Take 600 mg by mouth daily as needed for to loosen phlegm.    [provider]  HYDROcodone-acetaminophen (NORCO/VICODIN) 5-325 MG tablet Take 1 tablet by mouth 3 (three) times daily as needed. 10/03/20   [provider]  levalbuterol Pauline Aus(XOPENEX HFA) 45 MCG/ACT inhaler Inhale 2 puffs into the lungs every 4 (four) hours as needed for wheezing or shortness of breath.    [provider]  levalbuterol Pauline Aus(XOPENEX) 0.63 MG/3ML nebulizer solution Take 0.63 mg by nebulization 2 (two) times daily.    [provider]  METAMUCIL FIBER PO Take 11.6 g by mouth daily as needed (Constipation).    [provider]  methocarbamol (ROBAXIN) 500 MG tablet Take 1 tablet (500 mg total) by mouth every 6 (six) hours as needed for muscle spasms. 09/22/20   Dorcas CarrowGhimire, Kuber, MD  methylPREDNISolone (MEDROL DOSEPAK) 4 MG TBPK tablet Take by mouth. 10/07/20   [provider]  metoprolol succinate (TOPROL-XL) 50 MG 24 hr tablet Take 50 mg by mouth daily. 04/23/20   [provider]  montelukast (SINGULAIR) 10 MG tablet Take 10 mg by mouth daily. 04/19/20   [provider]  polyethylene glycol (MIRALAX / GLYCOLAX) 17 g packet Take 17 g by mouth daily as needed for mild constipation. 07/19/20   Osvaldo ShipperKrishnan, Gokul, MD  potassium chloride SA (KLOR-CON) 20 MEQ tablet Take 1 tablet (20 mEq total) by mouth daily. 10/12/20   Janetta Horahompson, Kathryn R, PA-C  tamsulosin (FLOMAX) 0.4 MG CAPS capsule Take 0.8 mg by mouth at bedtime. 05/21/20   [provider]  tizanidine (ZANAFLEX) 2 MG capsule Take 2 mg by  mouth at bedtime as needed for muscle spasms. 03/23/20   [provider]  torsemide (DEMADEX) 20 MG tablet Take 3 tablets (60 mg total) by mouth daily x 2 days only, then take 2 tablets (40 mg total) by mouth daily thereafter. 10/12/20   Janetta Horahompson, Kathryn R, PA-C  traMADol (ULTRAM) 50 MG tablet Take 50 mg by mouth daily as needed. 10/07/20   [provider]  warfarin (COUMADIN) 6 MG tablet Take 6 mg by mouth at bedtime. 06/23/20   [provider]    Physical Exam: Vitals:   10/13/20 1751 10/13/20 1752 10/13/20 1845 10/13/20 2217  BP:   124/82 (!) 133/98  Pulse:   99 (!) 114  Resp:   20 (!) 30  Temp:      TempSrc:      SpO2: 97%  95% 92%  Weight:  93.9 kg    Height:  5\' 6"  (1.676 m)        Constitutional: NAD, calm, comfortable Vitals:   10/13/20 1751 10/13/20 1752 10/13/20 1845 10/13/20 2217  BP:   124/82 (!) 133/98  Pulse:   99 (!) 114  Resp:   20 (!) 30  Temp:      TempSrc:      SpO2: 97%  95% 92%  Weight:  93.9 kg    Height:  5\' 6"  (1.676 m)     Eyes: PERRL, lids and conjunctivae normal ENMT: Mucous membranes are moist. Posterior pharynx clear of any exudate or lesions.Normal dentition.  Neck: normal, supple, no masses, no thyromegaly Respiratory: clear to auscultation bilaterally, no wheezing, no crackles. Normal respiratory effort. No accessory muscle use.  Cardiovascular: Regular rate and rhythm, no murmurs / rubs / gallops. No extremity edema. 2+ pedal pulses. No carotid bruits.  Abdomen: no tenderness, no masses palpated. No hepatosplenomegaly. Bowel sounds positive.  Musculoskeletal: no clubbing / cyanosis. No joint deformity upper and lower extremities. Good ROM, no contractures. Normal muscle tone.  Skin: no rashes, lesions, ulcers. No induration Neurologic: CN 2-12 grossly intact. Sensation intact, DTR normal. Strength 5/5 in all 4.  Psychiatric: Normal judgment and insight. Alert and oriented x 3. Normal mood.     Labs on Admission: I  have personally reviewed following labs and imaging studies  CBC: Recent Labs  Lab 10/13/20 1705  WBC 8.2  NEUTROABS 6.2  HGB 10.9*  HCT 35.6*  MCV 101.7*  PLT 333   Basic Metabolic Panel: Recent Labs  Lab 10/12/20 1458 10/13/20 1705  NA 138 138  K 5.3* 4.5  CL 95* 96*  CO2 30* 36*  GLUCOSE 106* 117*  BUN 32 25*  CREATININE 0.90 0.93  CALCIUM 8.5* 8.7*  MG  --  2.0   GFR: Estimated Creatinine Clearance: 55.5 mL/min (by C-G formula based on SCr of 0.93 mg/dL). Liver Function Tests: Recent Labs  Lab 10/13/20 1705  AST 19  ALT 16  ALKPHOS 142*  BILITOT 1.4*  PROT 6.5  ALBUMIN 2.8*   No results for input(s): LIPASE, AMYLASE in the last 168 hours. No results for input(s): AMMONIA in the last 168 hours. Coagulation Profile: No results for input(s): INR, PROTIME in the last 168 hours. Cardiac Enzymes: No results for input(s): CKTOTAL, CKMB, CKMBINDEX, TROPONINI in the last 168 hours. BNP (last 3 results) Recent Labs    10/12/20 1458  PROBNP 7,048*   HbA1C: No results for input(s): HGBA1C in the last 72 hours. CBG: No results for input(s): GLUCAP in the last 168 hours. Lipid Profile: No results for input(s): CHOL, HDL, LDLCALC, TRIG, CHOLHDL, LDLDIRECT in the last 72 hours. Thyroid Function Tests: No results for input(s): TSH, T4TOTAL, FREET4, T3FREE, THYROIDAB in the last 72 hours. Anemia Panel: No results for input(s): VITAMINB12, FOLATE, FERRITIN, TIBC, IRON, RETICCTPCT in the last 72 hours. Urine analysis:    Component Value Date/Time   COLORURINE YELLOW 10/13/2020 1705   APPEARANCEUR CLEAR 10/13/2020 1705   LABSPEC 1.011 10/13/2020 1705   PHURINE 6.0 10/13/2020 1705   GLUCOSEU NEGATIVE 10/13/2020 1705   HGBUR MODERATE (A) 10/13/2020 1705   BILIRUBINUR NEGATIVE 10/13/2020 1705   KETONESUR NEGATIVE 10/13/2020 1705   PROTEINUR NEGATIVE 10/13/2020 1705   NITRITE NEGATIVE 10/13/2020 1705   LEUKOCYTESUR NEGATIVE 10/13/2020 1705   Sepsis  Labs: @LABRCNTIP (procalcitonin:4,lacticidven:4) ) Recent Results (from the past 240 hour(s))  SARS CORONAVIRUS 2 (TAT 6-24 HRS) Nasopharyngeal Nasopharyngeal Swab     Status: None   Collection Time: 10/13/20  7:22 PM   Specimen: Nasopharyngeal Swab  Result Value Ref Range Status   SARS Coronavirus 2 NEGATIVE NEGATIVE Final  Comment: (NOTE) SARS-CoV-2 target nucleic acids are NOT DETECTED.  The SARS-CoV-2 RNA is generally detectable in upper and lower respiratory specimens during the acute phase of infection. Negative results do not preclude SARS-CoV-2 infection, do not rule out co-infections with other pathogens, and should not be used as the sole basis for treatment or other patient management decisions. Negative results must be combined with clinical observations, patient history, and epidemiological information. The expected result is Negative.  Fact Sheet for Patients: HairSlick.no  Fact Sheet for Healthcare Providers: quierodirigir.com  This test is not yet approved or cleared by the [place] FDA and  has been authorized for detection and/or diagnosis of SARS-CoV-2 by FDA under an Emergency Use Authorization (EUA). This EUA will remain  in effect (meaning this test can be used) for the duration of the COVID-19 declaration under Se ction 564(b)(1) of the Act, 21 U.S.C. section 360bbb-3(b)(1), unless the authorization is terminated or revoked sooner.  Performed at [facility], [address] [place], [place] [address]      Radiological Exams on Admission: DG Chest Portable 1 View  Result Date: 10/13/2020 CLINICAL DATA:  Shortness of breath and lower extremity edema. EXAM: PORTABLE CHEST 1 VIEW COMPARISON:  October 13, 2020 (5:17 p.m.) FINDINGS: Stable moderate to marked severity diffusely increased interstitial lung markings are noted. A small left pleural effusion is suspected. No pneumothorax is  identified. The cardiac silhouette is markedly enlarged. Marked severity calcification of the aortic arch is seen. Degenerative changes are noted throughout the thoracic spine. IMPRESSION: Cardiomegaly with pulmonary edema, stable in severity when compared to the prior study. Electronically Signed   By: Aram Candela M.D.   On: 10/13/2020 22:51   DG Chest Port 1 View  Result Date: 10/13/2020 CLINICAL DATA:  Shortness of breath.  Lower extremity edema. EXAM: PORTABLE CHEST 1 VIEW COMPARISON:  09/16/2020 FINDINGS: Chronic cardiomegaly. Unchanged mediastinal contours with aortic atherosclerosis. Interstitial thickening and Kerley B-lines consistent with pulmonary edema. Small bilateral pleural effusions are similar to prior exam. No pneumothorax. No evidence of pneumonia. IMPRESSION: 1. Pulmonary edema is new from last month. 2. Cardiomegaly and bilateral pleural effusions appear similar. Electronically Signed   By: Narda Rutherford M.D.   On: 10/13/2020 17:58    EKG: Independently reviewed. ***  Assessment/Plan Principal Problem:   Acute on chronic congestive heart failure (HCC) Active Problems:   Atrial fibrillation, chronic (HCC)   Severe aortic stenosis   COPD (chronic obstructive pulmonary disease) (HCC)   BPH (benign prostatic hyperplasia)     ***   DVT prophylaxis: ***  Code Status: ***  Family Communication: ***  Disposition Plan: ***  Consults called: ***  Admission status: ***   Severity of Illness: {Observation/Inpatient:21159}   GARBA,LAWAL MD [facility] Pager [phone]  If 7PM-7AM, please contact night-coverage [email or web address] Password [facility]  10/13/2020, 11:58 PM

## 2020-10-13 NOTE — ED Notes (Signed)
X-ray at bedside

## 2020-10-13 NOTE — ED Triage Notes (Signed)
Pt arrives via EMS from [facility]. Complaints of increased SOB, edema of lower extremities. Facility placed Pt placed on 4 liters to increase SpO2 84%. Placed on NRB by EMS to increase O2 99%  22 [place]hand 142/94 P 118 RR 24 T 97.6

## 2020-10-13 NOTE — ED Provider Notes (Signed)
[facility] EMERGENCY DEPARTMENT Provider Note   CSN: [id number] Arrival date & time: 10/13/20  1652     History No chief complaint on file.   RUXIN RANSOME is a 87 y.o. male.  HPI Patient presents from a rehabilitation facility with concern for dyspnea, edema.  Patient is here with his son who assists with the history. Additional details are obtained by EMS providers. Patient has a history of CHF, but was generally well until the last developed edema.  He saw his cardiologist yesterday.  He was started on increased Lasix dosing today.  However, with increased work of breathing, dyspnea, and oxygen requirement of 4 L compared to his baseline 2 to increase from 84 % to normal values, the patient was brought here for evaluation. In addition to his history of CHF he has a history of aortic valve disease, and was hospitalized last month after a fall that resulted in hip fracture.     My providers note that the patient required nonrebreather mask to maintain appropriate saturation in route.  No report of fever, no fall, no trauma, no pain.  Past Medical History:  Diagnosis Date  . Arthritis   . CHF (congestive heart failure) (HCC)   . COPD (chronic obstructive pulmonary disease) (HCC)   . Mitral regurgitation   . Persistent atrial fibrillation (HCC)    on coumadin   . RBBB   . Severe aortic stenosis     Patient Active Problem List   Diagnosis Date Noted  . Trochanteric fracture of left femur (HCC) 09/20/2020  . Fall at home, initial encounter 09/20/2020  . BPH (benign prostatic hyperplasia) 09/20/2020  . Mitral regurgitation   . COPD (chronic obstructive pulmonary disease) (HCC)   . Acute on chronic congestive heart failure (HCC)   . Severe aortic stenosis   . Chronic diastolic CHF (congestive heart failure) (HCC) 07/15/2020  . COPD with acute exacerbation (HCC) 07/15/2020  . Atrial fibrillation, chronic (HCC) 07/15/2020    Past Surgical History:   Procedure Laterality Date  . APPENDECTOMY    . BUBBLE STUDY  09/07/2020   Procedure: BUBBLE STUDY;  Surgeon: Sande Rives, MD;  Location: [facility] ENDOSCOPY;  Service: Cardiovascular;;  . CARDIAC CATHETERIZATION    . CLAVICLE SURGERY    . INTRAMEDULLARY (IM) NAIL INTERTROCHANTERIC Left 09/20/2020   Procedure: INTRAMEDULLARY (IM) NAIL INTERTROCHANTRIC;  Surgeon: Sheral Apley, MD;  Location: [facility] OR;  Service: Orthopedics;  Laterality: Left;  . RIGHT/LEFT HEART CATH AND CORONARY ANGIOGRAPHY N/A 08/24/2020   Procedure: RIGHT/LEFT HEART CATH AND CORONARY ANGIOGRAPHY;  Surgeon: Tonny Bollman, MD;  Location: [facility] INVASIVE CV LAB;  Service: Cardiovascular;  Laterality: N/A;  . TEE WITHOUT CARDIOVERSION N/A 09/07/2020   Procedure: TRANSESOPHAGEAL ECHOCARDIOGRAM (TEE);  Surgeon: Sande Rives, MD;  Location: [facility] ENDOSCOPY;  Service: Cardiovascular;  Laterality: N/A;  . TOTAL KNEE ARTHROPLASTY Bilateral        No family history on file.  Social History   Tobacco Use  . Smoking status: Former Smoker    Packs/day: 1.00    Years: 30.00    Pack years: 30.00    Quit date: 1975    Years since quitting: 47.2  . Smokeless tobacco: Never Used  Vaping Use  . Vaping Use: Never used  Substance Use Topics  . Alcohol use: Not Currently  . Drug use: Never    Home Medications Prior to Admission medications   Medication Sig Start Date End Date Taking? Authorizing Provider  acetaminophen (TYLENOL)  500 MG tablet Take 1 tablet (500 mg total) by mouth every 6 (six) hours as needed for mild pain or moderate pain. 09/22/20 10/22/20  Dorcas Carrow, MD  Camphor-Menthol-Methyl Sal (SALONPAS) 3.08-11-08 % PTCH Place 1 patch onto the skin daily as needed (muscle aches/pain.).    [provider]  diclofenac Sodium (VOLTAREN) 1 % GEL Apply 1 application topically 4 (four) times daily as needed (for pain).    [provider]  docusate sodium (COLACE) 100 MG capsule Take 1 capsule (100 mg  total) by mouth 2 (two) times daily. 07/19/20   Osvaldo Shipper, MD  dutasteride (AVODART) 0.5 MG capsule Take 0.5 mg by mouth daily. 05/21/20   [provider]  Eyelid Cleansers (EYESCRUB) PADS Place 1 each into both eyes daily as needed (dry eye).    [provider]  Glycerin-Polysorbate 80 (REFRESH DRY EYE THERAPY OP) Place 1 drop into both eyes daily as needed (Dry eyes).    [provider]  guaiFENesin (MUCINEX) 600 MG 12 hr tablet Take 600 mg by mouth daily as needed for to loosen phlegm.    [provider]  HYDROcodone-acetaminophen (NORCO/VICODIN) 5-325 MG tablet Take 1 tablet by mouth 3 (three) times daily as needed. 10/03/20   [provider]  levalbuterol Pauline Aus HFA) 45 MCG/ACT inhaler Inhale 2 puffs into the lungs every 4 (four) hours as needed for wheezing or shortness of breath.    [provider]  levalbuterol Pauline Aus) 0.63 MG/3ML nebulizer solution Take 0.63 mg by nebulization 2 (two) times daily.    [provider]  METAMUCIL FIBER PO Take 11.6 g by mouth daily as needed (Constipation).    [provider]  methocarbamol (ROBAXIN) 500 MG tablet Take 1 tablet (500 mg total) by mouth every 6 (six) hours as needed for muscle spasms. 09/22/20   Dorcas Carrow, MD  methylPREDNISolone (MEDROL DOSEPAK) 4 MG TBPK tablet Take by mouth. 10/07/20   [provider]  metoprolol succinate (TOPROL-XL) 50 MG 24 hr tablet Take 50 mg by mouth daily. 04/23/20   [provider]  montelukast (SINGULAIR) 10 MG tablet Take 10 mg by mouth daily. 04/19/20   [provider]  polyethylene glycol (MIRALAX / GLYCOLAX) 17 g packet Take 17 g by mouth daily as needed for mild constipation. 07/19/20   Osvaldo Shipper, MD  potassium chloride SA (KLOR-CON) 20 MEQ tablet Take 1 tablet (20 mEq total) by mouth daily. 10/12/20   Janetta Hora, PA-C  tamsulosin (FLOMAX) 0.4 MG CAPS capsule Take 0.8 mg by mouth at bedtime.  05/21/20   [provider]  tizanidine (ZANAFLEX) 2 MG capsule Take 2 mg by mouth at bedtime as needed for muscle spasms. 03/23/20   [provider]  torsemide (DEMADEX) 20 MG tablet Take 3 tablets (60 mg total) by mouth daily x 2 days only, then take 2 tablets (40 mg total) by mouth daily thereafter. 10/12/20   Janetta Hora, PA-C  traMADol (ULTRAM) 50 MG tablet Take 50 mg by mouth daily as needed. 10/07/20   [provider]  warfarin (COUMADIN) 6 MG tablet Take 6 mg by mouth at bedtime. 06/23/20   [provider]    Allergies    Patient has no known allergies.  Review of Systems   Review of Systems  Constitutional:       Per HPI, otherwise negative  HENT:       Per HPI, otherwise negative  Respiratory:       Per  HPI, otherwise negative  Cardiovascular:       Per HPI, otherwise negative  Gastrointestinal: Negative for vomiting.  Endocrine:       Negative aside from HPI  Genitourinary:       Neg aside from HPI   Musculoskeletal:       Per HPI, otherwise negative  Skin: Negative.   Neurological: Negative for syncope.    Physical Exam Updated Vital Signs BP (!) 143/101   Pulse 60   Temp 97.7 F (36.5 C) (Oral)   Resp (!) 25   Ht  (1.676 m)   Wt 93.9 kg   SpO2 97%   BMI 33.41 kg/m   Physical Exam Vitals and nursing note reviewed.  Constitutional:      Appearance: He is well-developed.  HENT:     Head: Normocephalic and atraumatic.  Eyes:     Conjunctiva/sclera: Conjunctivae normal.  Cardiovascular:     Rate and Rhythm: Regular rhythm. Tachycardia present.     Comments: 4 L Pulmonary:     Effort: Tachypnea present.     Breath sounds: Decreased air movement present. No stridor. No wheezing.  Abdominal:     General: There is no distension.  Musculoskeletal:     Right lower leg: Edema present.     Left lower leg: Edema present.  Skin:    General: Skin is warm and dry.  Neurological:     Mental Status: He is alert and  oriented to person, place, and time.      ED Results / Procedures / Treatments   Labs (all labs ordered are listed, but only abnormal results are displayed) Labs Reviewed  COMPREHENSIVE METABOLIC PANEL - Abnormal; Notable for the following components:      Result Value   Chloride 96 (*)    CO2 36 (*)    Glucose, Bld 117 (*)    BUN 25 (*)    Calcium 8.7 (*)    Albumin 2.8 (*)    Alkaline Phosphatase 142 (*)    Total Bilirubin 1.4 (*)    All other components within normal limits  CBC WITH DIFFERENTIAL/PLATELET - Abnormal; Notable for the following components:   RBC 3.50 (*)    Hemoglobin 10.9 (*)    HCT 35.6 (*)    MCV 101.7 (*)    RDW 15.9 (*)    All other components within normal limits  BRAIN NATRIURETIC PEPTIDE - Abnormal; Notable for the following components:   B Natriuretic Peptide 717.2 (*)    All other components within normal limits  SARS CORONAVIRUS 2 (TAT 6-24 HRS)  MAGNESIUM  URINALYSIS, ROUTINE W REFLEX MICROSCOPIC    EKG EKG Interpretation  Date/Time:  Thursday October 13 2020 16:54:01 EST Ventricular Rate:  113 PR Interval:    QRS Duration: 140 QT Interval:  359 QTC Calculation: 449 R Axis:   110 Text Interpretation: Atrial fibrillation RBBB and LPFB Anteroseptal infarct, age indeterminate Abnormal ECG Confirmed by Gerhard Munch [phone]) on 10/13/2020 5:25:15 PM   Radiology DG Chest Port 1 View  Result Date: 10/13/2020 CLINICAL DATA:  Shortness of breath.  Lower extremity edema. EXAM: PORTABLE CHEST 1 VIEW COMPARISON:  09/16/2020 FINDINGS: Chronic cardiomegaly. Unchanged mediastinal contours with aortic atherosclerosis. Interstitial thickening and Kerley B-lines consistent with pulmonary edema. Small bilateral pleural effusions are similar to prior exam. No pneumothorax. No evidence of pneumonia. IMPRESSION: 1. Pulmonary edema is new from last month. 2. Cardiomegaly and bilateral pleural effusions appear similar. Electronically Signed   By: Shawna Orleans  Sanford M.D.   On: 10/13/2020 17:58    Procedures Procedures   Medications Ordered in ED Medications  ondansetron (ZOFRAN) injection 4 mg (has no administration in time range)  furosemide (LASIX) injection 40 mg (40 mg Intravenous Given 10/13/20 1803)    ED Course  I have reviewed the triage vital signs and the nursing notes.  Pertinent labs & imaging results that were available during my care of the patient were reviewed by me and considered in my medical decision making (see chart for details).     On repeat exam Improved condition, now on nasal cannula. We have reviewed all findings, x-ray, EKG, labs, consistent with heart failure exacerbation. Patient has received IV Lasix. I discussed this case with our internal medicine colleagues, patient will be admitted for further monitoring, management.  Last ECHO:   1. The MV is degenerative with a small flail P2 segment. 2D VC 0.34 cm,  2D ERO 0.27 cm2 (79 degrees), R vol 28 cc. 3D VCA 0.13 cm2. The flail gap  is 0.21 cm and flail width 0.52 cm. The jet is eccentric and there is  systolic pulmonary flow reversal in  the left upper pulmonary vein, but due to the small flail segment, this is  not likely hemodynamically significant. There is mild mitral stenosis with  3D MVA 2.3 cm2. MG 3.8 mmHG @ 78 bpm. The mitral valve is degenerative.  Mild to moderate mitral valve  regurgitation. Mild mitral stenosis. The mean mitral valve gradient is 3.8  mmHg with average heart rate of 75 bpm. Severe mitral annular  calcification.   2. The bubble study was positive after 6 cardiac cycles, indicating there  is an intrapulmonary shunt. There is no interatrial shunt. Agitated saline  contrast bubble study was positive with shunting observed after >6 cardiac  cycles suggestive of  intrapulmonary shunting.   3. The aortic valve is tricuspid. There is severe calcifcation of the  aortic valve. There is severe thickening of the aortic valve. Aortic  valve  regurgitation is mild. Severe aortic valve stenosis. Aortic valve area, by  VTI measures 0.52 cm. Aortic  valve Vmax measures 3.94 m/s.   4. Left ventricular ejection fraction, by estimation, is 50 to 55%. The  left ventricle has low normal function. The left ventricle has no regional  wall motion abnormalities.   5. Right ventricular systolic function is mildly reduced. The right  ventricular size is moderately enlarged.   6. Left atrial size was severely dilated. No left atrial/left atrial  appendage thrombus was detected. The LAA emptying velocity was 32 cm/s.   7. Right atrial size was severely dilated.   8. The tricuspid valve is abnormal. Tricuspid valve regurgitation is  moderate to severe.   9. Pulmonic valve regurgitation is moderate.  10. Severely dilated pulmonary artery.   MDM Rules/Calculators/A&P MDM Number of Diagnoses or Management Options Acute on chronic diastolic congestive heart failure (HCC): established, worsening Hypoxia: new, needed workup   Amount and/or Complexity of Data Reviewed Clinical lab tests: reviewed Tests in the radiology section of CPT: reviewed Tests in the medicine section of CPT: reviewed Decide to obtain previous medical records or to obtain history from someone other than the patient: yes Obtain history from someone other than the patient: yes Review and summarize past medical records: yes Discuss the patient with other providers: yes Independent visualization of images, tracings, or specimens: yes  Risk of Complications, Morbidity, and/or Mortality Presenting problems: high Diagnostic procedures: high Management options: high  Critical Care Total time providing critical care: < 30 minutes  Patient Progress Patient progress: stable  Final Clinical Impression(s) / ED Diagnoses Final diagnoses:  Acute on chronic diastolic congestive heart failure (HCC)  Hypoxia      Gerhard Munch, MD 10/13/20 1942

## 2020-10-13 NOTE — Telephone Encounter (Signed)
Left voicemail message to contact [facility] at [phone].

## 2020-10-13 NOTE — Telephone Encounter (Signed)
-----   Message from Janetta Hora, PA-C sent at 10/13/2020  7:24 AM EST ----- Labs c/w significant volume overload. Potassium also mildly elevated. I think it should normalize on higher dose of diuretic but can we ask him to hold his potassium x 2 days. Also, please have them call us if he is not losing weight and feeling better over the next week as he may require admission. If going in right direction, I will see him next Thursday as planned.

## 2020-10-13 NOTE — ED Notes (Signed)
Pt assisted with urinal, pt stating continued Sob despite lasix administration. Pt speaking in two word sentences, spo2 90% on 5 liters [place], bed placed in high in 90degree angle, Provider to be notified.

## 2020-10-14 ENCOUNTER — Encounter (HOSPITAL_COMMUNITY): Payer: Self-pay | Admitting: Internal Medicine

## 2020-10-14 DIAGNOSIS — I5033 Acute on chronic diastolic (congestive) heart failure: Principal | ICD-10-CM

## 2020-10-14 LAB — CBC WITH DIFFERENTIAL/PLATELET
Abs Immature Granulocytes: 0.03 10*3/uL (ref 0.00–0.07)
Basophils Absolute: 0.1 10*3/uL (ref 0.0–0.1)
Basophils Relative: 1 %
Eosinophils Absolute: 0.3 10*3/uL (ref 0.0–0.5)
Eosinophils Relative: 4 %
HCT: 33.6 % — ABNORMAL LOW (ref 39.0–52.0)
Hemoglobin: 10.7 g/dL — ABNORMAL LOW (ref 13.0–17.0)
Immature Granulocytes: 0 %
Lymphocytes Relative: 12 %
Lymphs Abs: 0.8 10*3/uL (ref 0.7–4.0)
MCH: 31.8 pg (ref 26.0–34.0)
MCHC: 31.8 g/dL (ref 30.0–36.0)
MCV: 99.7 fL (ref 80.0–100.0)
Monocytes Absolute: 0.8 10*3/uL (ref 0.1–1.0)
Monocytes Relative: 11 %
Neutro Abs: 4.9 10*3/uL (ref 1.7–7.7)
Neutrophils Relative %: 72 %
Platelets: 321 10*3/uL (ref 150–400)
RBC: 3.37 MIL/uL — ABNORMAL LOW (ref 4.22–5.81)
RDW: 15.8 % — ABNORMAL HIGH (ref 11.5–15.5)
WBC: 7 10*3/uL (ref 4.0–10.5)
nRBC: 0 % (ref 0.0–0.2)

## 2020-10-14 LAB — BASIC METABOLIC PANEL
Anion gap: 9 (ref 5–15)
BUN: 25 mg/dL — ABNORMAL HIGH (ref 8–23)
CO2: 33 mmol/L — ABNORMAL HIGH (ref 22–32)
Calcium: 8.5 mg/dL — ABNORMAL LOW (ref 8.9–10.3)
Chloride: 97 mmol/L — ABNORMAL LOW (ref 98–111)
Creatinine, Ser: 0.9 mg/dL (ref 0.61–1.24)
GFR, Estimated: >60 mL/min (ref >60)
Glucose, Bld: 108 mg/dL — ABNORMAL HIGH (ref 70–99)
Potassium: 3.8 mmol/L (ref 3.5–5.1)
Sodium: 139 mmol/L (ref 135–145)

## 2020-10-14 LAB — PROTIME-INR
INR: 3.5 — ABNORMAL HIGH (ref 0.8–1.2)
Prothrombin Time: 34.2 seconds — ABNORMAL HIGH (ref 11.4–15.2)

## 2020-10-14 MED ORDER — METOPROLOL SUCCINATE ER 50 MG PO TB24
50.0000 mg | ORAL_TABLET | Freq: Every day | ORAL | Status: DC
  Administered 2020-10-14 – 2020-10-17 (×4): 50 mg via ORAL
  Filled 2020-10-14 (×4): qty 1

## 2020-10-14 MED ORDER — WARFARIN - PHARMACIST DOSING INPATIENT: Freq: Every day | Status: DC

## 2020-10-14 MED ORDER — DICLOFENAC SODIUM 1 % EX GEL
2.0000 g | Freq: Three times a day (TID) | CUTANEOUS | Status: DC
  Administered 2020-10-14 – 2020-10-17 (×8): 2 g via TOPICAL
  Filled 2020-10-14: qty 100

## 2020-10-14 NOTE — Progress Notes (Signed)
Patient c/o of left hip pain 6/10. Describes pain as shooting pains. Patient repositioned and Ice pack applied. Dr. Leafy Half paged via [facility] communication.

## 2020-10-14 NOTE — Progress Notes (Addendum)
Patient states that pain is worse, 10/10, shooting pains. Ice pack and repositioning not helpful. Dr. Leafy Half paged via [facility] communication.

## 2020-10-14 NOTE — Progress Notes (Signed)
Heart Failure Navigator Progress Note  Assessed for Heart & Vascular TOC clinic readiness.  Unfortunately at this time the patient does not meet criteria due to known severe aortic stenosis, planned for TAVR but complicated/delayed d/t mechanical fall resulting in ORIF of hip.   Navigator available for reassessment of patient.   Ozella Rocks, RN, BSN Heart Failure Nurse Navigator [phone]

## 2020-10-14 NOTE — Progress Notes (Signed)
ANTICOAGULATION CONSULT NOTE  Pharmacy Consult for Warfarin  Indication: atrial fibrillation  No Known Allergies  Patient Measurements: Height: 5\' 6"  (167.6 cm) Weight: 93.9 kg (207 lb) IBW/kg (Calculated) : 63.8  Vital Signs: BP: 144/113 (03/11 0530) Pulse Rate: 112 (03/11 0530)  Labs: Recent Labs    10/12/20 1458 10/13/20 1705 10/14/20 0208  HGB  --  10.9* 10.7*  HCT  --  35.6* 33.6*  PLT  --  333 321  LABPROT  --   --  34.2*  INR  --   --  3.5*  CREATININE 0.90 0.93 0.90    Estimated Creatinine Clearance: 57.3 mL/min (by C-G formula based on SCr of 0.9 mg/dL).   Medical History: Past Medical History:  Diagnosis Date  . Arthritis   . CHF (congestive heart failure) (HCC)   . COPD (chronic obstructive pulmonary disease) (HCC)   . Mitral regurgitation   . Persistent atrial fibrillation (HCC)    on coumadin   . RBBB   . Severe aortic stenosis      Assessment: 87 y/o [name] on warfarin PTA for afib, presenting to the ED from a nursing facility with SOB/LLE and is being treated for acute on chronic CHF exacerbation. Home warfarin dose is 5 mg daily with last dose 3/9. Hbg 10.7, HCT 33.6, Plt 321. No bleeding noted. Pharmacy has been consulted to resume warfarin when appropriate.  3/11 INR 3.5 - supratherapeutic  Goal of Therapy:  INR 2-3 Monitor platelets by anticoagulation protocol: Yes   Plan:  No warfarin tonight 3/11 INR with AM labs to assess dosing needs  5/11 PharmD Candidate 2022 10/14/2020 7:30 AM

## 2020-10-14 NOTE — NC FL2 (Signed)
[place] MEDICAID [id number] LEVEL OF CARE SCREENING TOOL     IDENTIFICATION  Patient Name: Ruben [name] Birthdate: [birth year] Sex: male Admission Date (Current Location): 10/13/2020  [facility] and [place] Number:  Producer, television/film/video and Address:  [facility], [address] [place], [place] [address]      Provider Number: [id number]  Attending Physician Name and Address:  Rometta Emery, MD  Relative Name and Phone Number:  [place], [place] [facility])  [phone]    Current Level of Care: Hospital Recommended Level of Care: Skilled Nursing Facility Prior Approval Number:    Date Approved/Denied:   PASRR Number: [id number]  Discharge Plan: SNF    Current Diagnoses: Patient Active Problem List   Diagnosis Date Noted  . Acute exacerbation of CHF (congestive heart failure) (HCC) 10/13/2020  . Trochanteric fracture of left femur (HCC) 09/20/2020  . Fall at home, initial encounter 09/20/2020  . BPH (benign prostatic hyperplasia) 09/20/2020  . Mitral regurgitation   . COPD (chronic obstructive pulmonary disease) (HCC)   . Acute on chronic congestive heart failure (HCC)   . Severe aortic stenosis   . Chronic diastolic CHF (congestive heart failure) (HCC) 07/15/2020  . COPD with acute exacerbation (HCC) 07/15/2020  . Atrial fibrillation, chronic (HCC) 07/15/2020    Orientation RESPIRATION BLADDER Height & Weight     Self,Time,Situation,Place  O2 (Nasal Cannula 4L) Continent Weight: 202 lb 6.1 oz (91.8 kg) Height:  5\' 7"  (170.2 cm)  BEHAVIORAL SYMPTOMS/MOOD NEUROLOGICAL BOWEL NUTRITION STATUS      Continent Diet (See DC Summary)  AMBULATORY STATUS COMMUNICATION OF NEEDS Skin   Extensive Assist Verbally Surgical wounds (Incision/Wound Left Hip)                       Personal Care Assistance Level of Assistance  Feeding,Dressing,Bathing Bathing Assistance: Maximum assistance Feeding assistance: Independent Dressing Assistance: Maximum assistance      Functional Limitations Info  Sight,Hearing,Speech Sight Info: Adequate Hearing Info: Adequate Speech Info: Adequate    SPECIAL CARE FACTORS FREQUENCY  PT (By licensed PT),OT (By licensed OT)     PT Frequency: 5x a week OT Frequency: 5x a week            Contractures Contractures Info: Not present    Additional Factors Info  Code Status,Allergies Code Status Info: DNR Allergies Info: No known Allergies           Current Medications (10/14/2020):  This is the current hospital active medication list Current Facility-Administered Medications  Medication Dose Route Frequency Provider Last Rate Last Admin  . 0.9 %  sodium chloride infusion  250 mL Intravenous PRN 12/14/2020 L, MD      . acetaminophen (TYLENOL) tablet 650 mg  650 mg Oral Q4H PRN Earlie Lou, MD   650 mg at 10/13/20 2025  . furosemide (LASIX) injection 40 mg  40 mg Intravenous Q12H 12/13/20, MD   40 mg at 10/14/20 0840  . ondansetron (ZOFRAN) injection 4 mg  4 mg Intravenous Q6H PRN Garba, Mohammad L, MD      . sodium chloride flush (NS) 0.9 % injection 3 mL  3 mL Intravenous Q12H Garba, Mohammad L, MD      . sodium chloride flush (NS) 0.9 % injection 3 mL  3 mL Intravenous PRN 12/14/20, MD      . Warfarin - Pharmacist Dosing Inpatient   Does not apply q1600 Rometta Emery, [facility]  Discharge Medications: Please see discharge summary for a list of discharge medications.  Relevant Imaging Results:  Relevant Lab Results:   Additional Information SS# [id number]  Ivette Loyal, [place]

## 2020-10-14 NOTE — Progress Notes (Signed)
[facility]  PROGRESS NOTE  Ruben [name] HER:[id number] DOB: Aug 21, 1926 DOA: 10/13/2020 PCP: Clovis Riley, L.August Saucer, MD   Brief HPI:   87 year old male with history of severe aortic stenosis, chronic diastolic CHF, COPD, persistent atrial fibrillation on chronic anticoagulation with Coumadin, history of  right bundle branch block, recent fall with hip fracture in February came to ED with complaints of shortness of breath, cough and hypoxemia.  Patient also noted worsening pedal edema.  He was recently in rehab facility after surgery.  His baseline O2 requirement was 2 L/min now requirinIn the ED chest x-ray showed findings consistent with pulmonary edema.   Subjective   Patient seen and examined, feels better this morning.  Denies shortness of breath.   Assessment/Plan:     1. Acute on chronic diastolic CHF exacerbation-patient's EF is 55 to 60% as of December 2021, right ventricular systolic function is moderately reduced.  Also has severe aortic stenosis, mild to moderate mitral regurgitation.  Patient started on Lasix 40 mg IV every 12 hours.  Net -885 mL.  Strict intake and output. 2. Atrial fibrillation RVR-continue anticoagulation with warfarin.  Will restart home medications including metoprolol. 3. Severe aortic stenosis-patient has severe aortic gnosis with valve area 0.52 centimeters square, patient to get TAVR per cardiology.  Follow-up cardiology as outpatient. 4. COPD-no acute exacerbation, continue Xopenex as needed.     COVID-19 Labs  No results for input(s): DDIMER, FERRITIN, LDH, CRP in the last 72 hours.  Lab Results  Component Value Date   SARSCOV2NAA NEGATIVE 10/13/2020   SARSCOV2NAA NEGATIVE 09/22/2020   SARSCOV2NAA NEGATIVE 09/20/2020   SARSCOV2NAA NEGATIVE 09/16/2020     Scheduled medications:   . furosemide  40 mg Intravenous Q12H  . sodium chloride flush  3 mL Intravenous Q12H  . Warfarin - Pharmacist Dosing Inpatient   Does not apply q1600          CBG: No results for input(s): GLUCAP in the last 168 hours.  SpO2: 100 % O2 Flow Rate (L/min): 4 L/min    CBC: Recent Labs  Lab 10/13/20 1705 10/14/20 0208  WBC 8.2 7.0  NEUTROABS 6.2 4.9  HGB 10.9* 10.7*  HCT 35.6* 33.6*  MCV 101.7* 99.7  PLT 333 321    Basic Metabolic Panel: Recent Labs  Lab 10/12/20 1458 10/13/20 1705 10/14/20 0208  NA 138 138 139  K 5.3* 4.5 3.8  CL 95* 96* 97*  CO2 30* 36* 33*  GLUCOSE 106* 117* 108*  BUN 32 25* 25*  CREATININE 0.90 0.93 0.90  CALCIUM 8.5* 8.7* 8.5*  MG  --  2.0  --      Liver Function Tests: Recent Labs  Lab 10/13/20 1705  AST 19  ALT 16  ALKPHOS 142*  BILITOT 1.4*  PROT 6.5  ALBUMIN 2.8*     Antibiotics: Anti-infectives (From admission, onward)   None       DVT prophylaxis: Warfarin  Code Status: [facility]  Family Communication: No family at bedside   Consultants:    Procedures:      Objective   Vitals:   10/14/20 0944 10/14/20 1045 10/14/20 1400 10/14/20 1531  BP: 118/77 (!) 143/87 (!) 114/97 135/81  Pulse: (!) 112 (!) 104 (!) 103 (!) 101  Resp: (!) 30 20 20  (!) 23  Temp:  97.8 F (36.6 C)  97.9 F (36.6 C)  TempSrc:  Oral  Oral  SpO2: 95% 100% 99% 100%  Weight:  91.8 kg    Height:  5\' 7"  (1.702  m)      Intake/Output Summary (Last 24 hours) at 10/14/2020 1553 Last data filed at 10/14/2020 1455 Gross per 24 hour  Intake 240 ml  Output 1125 ml  Net -885 ml    No intake/output data recorded.  Filed Weights   10/13/20 1752 10/14/20 1045  Weight: 93.9 kg 91.8 kg    Physical Examination:   General-appears in no acute distress Heart-S1-S2, regular, grade 3/6 systolic murmur at the aortic area Lungs-clear to auscultation bilaterally, no wheezing or crackles auscultated Abdomen-soft, nontender, no organomegaly Extremities-bilateral 3+ pitting edema noted in lower extremities Neuro-alert, oriented x3, no focal deficit noted  Status is: Inpatient  Dispo: The patient is  from: Home              Anticipated d/c is to: Home              Anticipated d/c date is: 10/18/2020              Patient currently not stable for discharge  Barrier to discharge-ongoing treatment for CHF exacerbation       Data Reviewed:   Recent Results (from the past 240 hour(s))  SARS CORONAVIRUS 2 (TAT 6-24 HRS) Nasopharyngeal Nasopharyngeal Swab     Status: None   Collection Time: 10/13/20  7:22 PM   Specimen: Nasopharyngeal Swab  Result Value Ref Range Status   SARS Coronavirus 2 NEGATIVE NEGATIVE Final    Comment: (NOTE) SARS-CoV-2 target nucleic acids are NOT DETECTED.  The SARS-CoV-2 RNA is generally detectable in upper and lower respiratory specimens during the acute phase of infection. Negative results do not preclude SARS-CoV-2 infection, do not rule out co-infections with other pathogens, and should not be used as the sole basis for treatment or other patient management decisions. Negative results must be combined with clinical observations, patient history, and epidemiological information. The expected result is Negative.  Fact Sheet for Patients: HairSlick.no  Fact Sheet for Healthcare Providers: quierodirigir.com  This test is not yet approved or cleared by the [place] FDA and  has been authorized for detection and/or diagnosis of SARS-CoV-2 by FDA under an Emergency Use Authorization (EUA). This EUA will remain  in effect (meaning this test can be used) for the duration of the COVID-19 declaration under Se ction 564(b)(1) of the Act, 21 U.S.C. section 360bbb-3(b)(1), unless the authorization is terminated or revoked sooner.  Performed at [facility], [address], [place], [place] [address]     No results for input(s): LIPASE, AMYLASE in the last 168 hours. No results for input(s): AMMONIA in the last 168 hours.  Cardiac Enzymes: No results for input(s): CKTOTAL, CKMB, CKMBINDEX,  TROPONINI in the last 168 hours. BNP (last 3 results) Recent Labs    07/15/20 1256 10/13/20 1705  BNP 487.9* 717.2*    ProBNP (last 3 results) Recent Labs    10/12/20 1458  PROBNP 7,048*    Studies:  DG Chest Portable 1 View  Result Date: 10/13/2020 CLINICAL DATA:  Shortness of breath and lower extremity edema. EXAM: PORTABLE CHEST 1 VIEW COMPARISON:  October 13, 2020 (5:17 p.m.) FINDINGS: Stable moderate to marked severity diffusely increased interstitial lung markings are noted. A small left pleural effusion is suspected. No pneumothorax is identified. The cardiac silhouette is markedly enlarged. Marked severity calcification of the aortic arch is seen. Degenerative changes are noted throughout the thoracic spine. IMPRESSION: Cardiomegaly with pulmonary edema, stable in severity when compared to the prior study. Electronically Signed   By:  Aram Candela M.D.   On: 10/13/2020 22:51   DG Chest Port 1 View  Result Date: 10/13/2020 CLINICAL DATA:  Shortness of breath.  Lower extremity edema. EXAM: PORTABLE CHEST 1 VIEW COMPARISON:  09/16/2020 FINDINGS: Chronic cardiomegaly. Unchanged mediastinal contours with aortic atherosclerosis. Interstitial thickening and Kerley B-lines consistent with pulmonary edema. Small bilateral pleural effusions are similar to prior exam. No pneumothorax. No evidence of pneumonia. IMPRESSION: 1. Pulmonary edema is new from last month. 2. Cardiomegaly and bilateral pleural effusions appear similar. Electronically Signed   By: Narda Rutherford M.D.   On: 10/13/2020 17:58       Jasmine Maceachern S Sakari Alkhatib   [facility] If 7PM-7AM, please contact night-coverage at [email or web address], Office  [phone]   10/14/2020, 3:53 PM  LOS: 1 day

## 2020-10-14 NOTE — Progress Notes (Signed)
ANTICOAGULATION CONSULT NOTE - Initial Consult  Pharmacy Consult for Warfarin  Indication: atrial fibrillation  No Known Allergies  Patient Measurements: Height: 5\' 6"  (167.6 cm) Weight: 93.9 kg (207 lb) IBW/kg (Calculated) : 63.8  Vital Signs: Temp: 97.7 F (36.5 C) (03/10 1653) Temp Source: Oral (03/10 1653) BP: 133/98 (03/10 2217) Pulse Rate: 114 (03/10 2217)  Labs: Recent Labs    10/12/20 1458 10/13/20 1705  HGB  --  10.9*  HCT  --  35.6*  PLT  --  333  CREATININE 0.90 0.93    Estimated Creatinine Clearance: 55.5 mL/min (by C-G formula based on SCr of 0.93 mg/dL).   Medical History: Past Medical History:  Diagnosis Date  . Arthritis   . CHF (congestive heart failure) (HCC)   . COPD (chronic obstructive pulmonary disease) (HCC)   . Mitral regurgitation   . Persistent atrial fibrillation (HCC)    on coumadin   . RBBB   . Severe aortic stenosis      Assessment: 87 y/o M on warfarin PTA for afib, he presents to the ED from a nursing facility with shortness of breath and lower extremity edema. No INR has been drawn yet. Hgb 10.9.   Goal of Therapy:  INR 2-3 Monitor platelets by anticoagulation protocol: Yes   Plan:  INR with AM labs to assess dosing needs  82, PharmD, BCPS Clinical Pharmacist Phone: [phone]

## 2020-10-15 DIAGNOSIS — I5043 Acute on chronic combined systolic (congestive) and diastolic (congestive) heart failure: Secondary | ICD-10-CM

## 2020-10-15 LAB — BASIC METABOLIC PANEL
Anion gap: 9 (ref 5–15)
BUN: 24 mg/dL — ABNORMAL HIGH (ref 8–23)
CO2: 35 mmol/L — ABNORMAL HIGH (ref 22–32)
Calcium: 8.5 mg/dL — ABNORMAL LOW (ref 8.9–10.3)
Chloride: 95 mmol/L — ABNORMAL LOW (ref 98–111)
Creatinine, Ser: 0.76 mg/dL (ref 0.61–1.24)
GFR, Estimated: >60 mL/min (ref >60)
Glucose, Bld: 97 mg/dL (ref 70–99)
Potassium: 4.2 mmol/L (ref 3.5–5.1)
Sodium: 139 mmol/L (ref 135–145)

## 2020-10-15 LAB — PROTIME-INR
INR: 2.8 — ABNORMAL HIGH (ref 0.8–1.2)
Prothrombin Time: 28.8 seconds — ABNORMAL HIGH (ref 11.4–15.2)

## 2020-10-15 MED ORDER — HYDROCODONE-ACETAMINOPHEN 5-325 MG PO TABS
1.0000 | ORAL_TABLET | Freq: Four times a day (QID) | ORAL | Status: DC | PRN
  Administered 2020-10-15 – 2020-10-16 (×2): 1 via ORAL
  Filled 2020-10-15 (×2): qty 1

## 2020-10-15 MED ORDER — WARFARIN SODIUM 1 MG PO TABS
1.0000 mg | ORAL_TABLET | Freq: Once | ORAL | Status: AC
  Administered 2020-10-15: 1 mg via ORAL
  Filled 2020-10-15: qty 1

## 2020-10-15 NOTE — Progress Notes (Signed)
PROGRESS NOTE    Ruben [name]  XBM:[id number] DOB: Oct 31, 1926 DOA: 10/13/2020 PCP: Ruben [name], RubenRuben Saucer, MD    Brief Narrative:  87 year old gentleman with history of severe aortic stenosis, chronic diastolic heart failure, COPD and chronic hypoxia on 2 L oxygen, chronic A. fib on chronic anticoagulation with Coumadin, history of right bundle branch block, recent left hip fracture and currently at a skilled nursing rehab brought back to the ER with complaining of shortness of breath, cough and hypoxemia.  Patient was also noted to have pedal edema.  Does use 2 L oxygen at home.  In the emergency room hemodynamically stable.  X-ray consistent with pulmonary edema.   Assessment & Plan:   Principal Problem:   Acute on chronic congestive heart failure (HCC) Active Problems:   Atrial fibrillation, chronic (HCC)   Severe aortic stenosis   COPD (chronic obstructive pulmonary disease) (HCC)   BPH (benign prostatic hyperplasia)  Acute on chronic diastolic CHF: Also suspect fluid overload secondary to valvular heart disease: Echocardiogram known EF 55-60%.  Patient on torsemide at home.  Started on IV Lasix 40 mg twice daily with good diuresis.  Start mobilizing and monitor.  Will change to oral torsemide on discharge.  Severe aortic stenosis: Patient has been evaluated for TAVR, setbacks due to acute illnesses.  Followed by cardiology.  Chronic atrial fibrillation: Rate controlled on metoprolol.  Continue.  Therapeutic on Coumadin, pharmacy managing.  Left hip fracture: Persistent pain.  Currently working with therapy at skilled nursing rehab.  Use additional pain medications to facilitate therapy.   DVT prophylaxis:  warfarin (COUMADIN) tablet 1 mg   Code Status: DNR Family Communication: Patient's son Ruben [name], Ruben [name]. on the phone Disposition Plan: Status is: Inpatient  Remains inpatient appropriate because:Inpatient level of care appropriate due to severity of illness   Dispo: The  patient is from: SNF              Anticipated d/c is to: SNF              Patient currently is not medically stable to d/c.   Difficult to place patient No         Consultants:   None  Procedures:   None  Antimicrobials:   None   Subjective: Patient seen and examined.  Overnight events noted.  He had severe hip pain and had a rough night.  Breathing is at about baseline.  Right leg swelling has improved.  Left leg swelling has not improved.  Is therapeutic on Coumadin.  Urine output 1300 mL overnight.  Objective: Vitals:   10/14/20 2013 10/14/20 2333 10/15/20 0319 10/15/20 0746  BP:  127/85 122/76 126/79  Pulse:  96 (!) 101 96  Resp:  20 20 20   Temp: 98.1 F (36.7 C) (!) 97.4 F (36.3 C) 97.7 F (36.5 C) 97.7 F (36.5 C)  TempSrc: Oral Oral Oral Oral  SpO2:  94% 93% 95%  Weight:   91.9 kg   Height:        Intake/Output Summary (Last 24 hours) at 10/15/2020 0844 Last data filed at 10/15/2020 0335 Gross per 24 hour  Intake 606 ml  Output 1350 ml  Net -744 ml   Filed Weights   10/13/20 1752 10/14/20 1045 10/15/20 0319  Weight: 93.9 kg 91.8 kg 91.9 kg    Examination:  General exam: Appears calm and comfortable  Chronically sick looking but not in any distress. Respiratory system: Clear to auscultation. Respiratory effort normal.  No added sounds.  Cardiovascular system: S1 & S2 heard, RRR.  Patient has loud pansystolic murmur all over the precordium.   1+ edema left leg, trace edema right leg. Left lateral thigh incision with some surrounding edema but no erythema. Gastrointestinal system: Abdomen is nondistended, soft and nontender. No organomegaly or masses felt. Normal bowel sounds heard. Central nervous system: Alert and oriented. No focal neurological deficits. Extremities: Symmetric 5 x 5 power.  Generalized weakness.    Data Reviewed: I have personally reviewed following labs and imaging studies  CBC: Recent Labs  Lab 10/13/20 1705  10/14/20 0208  WBC 8.2 7.0  NEUTROABS 6.2 4.9  HGB 10.9* 10.7*  HCT 35.6* 33.6*  MCV 101.7* 99.7  PLT 333 321   Basic Metabolic Panel: Recent Labs  Lab 10/12/20 1458 10/13/20 1705 10/14/20 0208 10/15/20 0317  NA 138 138 139 139  K 5.3* 4.5 3.8 4.2  CL 95* 96* 97* 95*  CO2 30* 36* 33* 35*  GLUCOSE 106* 117* 108* 97  BUN 32 25* 25* 24*  CREATININE 0.90 0.93 0.90 0.76  CALCIUM 8.5* 8.7* 8.5* 8.5*  MG  --  2.0  --   --    GFR: Estimated Creatinine Clearance: 65 mL/min (by C-G formula based on SCr of 0.76 mg/dL). Liver Function Tests: Recent Labs  Lab 10/13/20 1705  AST 19  ALT 16  ALKPHOS 142*  BILITOT 1.4*  PROT 6.5  ALBUMIN 2.8*   No results for input(s): LIPASE, AMYLASE in the last 168 hours. No results for input(s): AMMONIA in the last 168 hours. Coagulation Profile: Recent Labs  Lab 10/14/20 0208 10/15/20 0317  INR 3.5* 2.8*   Cardiac Enzymes: No results for input(s): CKTOTAL, CKMB, CKMBINDEX, TROPONINI in the last 168 hours. BNP (last 3 results) Recent Labs    10/12/20 1458  PROBNP 7,048*   HbA1C: No results for input(s): HGBA1C in the last 72 hours. CBG: No results for input(s): GLUCAP in the last 168 hours. Lipid Profile: No results for input(s): CHOL, HDL, LDLCALC, TRIG, CHOLHDL, LDLDIRECT in the last 72 hours. Thyroid Function Tests: No results for input(s): TSH, T4TOTAL, FREET4, T3FREE, THYROIDAB in the last 72 hours. Anemia Panel: No results for input(s): VITAMINB12, FOLATE, FERRITIN, TIBC, IRON, RETICCTPCT in the last 72 hours. Sepsis Labs: No results for input(s): PROCALCITON, LATICACIDVEN in the last 168 hours.  Recent Results (from the past 240 hour(s))  SARS CORONAVIRUS 2 (TAT 6-24 HRS) Nasopharyngeal Nasopharyngeal Swab     Status: None   Collection Time: 10/13/20  7:22 PM   Specimen: Nasopharyngeal Swab  Result Value Ref Range Status   SARS Coronavirus 2 NEGATIVE NEGATIVE Final    Comment: (NOTE) SARS-CoV-2 target nucleic  acids are NOT DETECTED.  The SARS-CoV-2 RNA is generally detectable in upper and lower respiratory specimens during the acute phase of infection. Negative results do not preclude SARS-CoV-2 infection, do not rule out co-infections with other pathogens, and should not be used as the sole basis for treatment or other patient management decisions. Negative results must be combined with clinical observations, patient history, and epidemiological information. The expected result is Negative.  Fact Sheet for Patients: HairSlick.no  Fact Sheet for Healthcare Providers: quierodirigir.com  This test is not yet approved or cleared by the [place] FDA and  has been authorized for detection and/or diagnosis of SARS-CoV-2 by FDA under an Emergency Use Authorization (EUA). This EUA will remain  in effect (meaning this test can be used) for the duration of the COVID-19 declaration under Se  ction 564(b)(1) of the Act, 21 U.S.C. section 360bbb-3(b)(1), unless the authorization is terminated or revoked sooner.  Performed at [facility], [address] [place], [place] [address]          Radiology Studies: DG Chest Portable 1 View  Result Date: 10/13/2020 CLINICAL DATA:  Shortness of breath and lower extremity edema. EXAM: PORTABLE CHEST 1 VIEW COMPARISON:  October 13, 2020 (5:17 p.m.) FINDINGS: Stable moderate to marked severity diffusely increased interstitial lung markings are noted. A small left pleural effusion is suspected. No pneumothorax is identified. The cardiac silhouette is markedly enlarged. Marked severity calcification of the aortic arch is seen. Degenerative changes are noted throughout the thoracic spine. IMPRESSION: Cardiomegaly with pulmonary edema, stable in severity when compared to the prior study. Electronically Signed   By: Aram Candela M.D.   On: 10/13/2020 22:51   DG Chest Port 1 View  Result Date:  10/13/2020 CLINICAL DATA:  Shortness of breath.  Lower extremity edema. EXAM: PORTABLE CHEST 1 VIEW COMPARISON:  09/16/2020 FINDINGS: Chronic cardiomegaly. Unchanged mediastinal contours with aortic atherosclerosis. Interstitial thickening and Kerley B-lines consistent with pulmonary edema. Small bilateral pleural effusions are similar to prior exam. No pneumothorax. No evidence of pneumonia. IMPRESSION: 1. Pulmonary edema is new from last month. 2. Cardiomegaly and bilateral pleural effusions appear similar. Electronically Signed   By: Narda Rutherford M.D.   On: 10/13/2020 17:58        Scheduled Meds: . diclofenac Sodium  2 g Topical TID  . furosemide  40 mg Intravenous Q12H  . metoprolol succinate  50 mg Oral Daily  . sodium chloride flush  3 mL Intravenous Q12H  . warfarin  1 mg Oral ONCE-1600  . Warfarin - Pharmacist Dosing Inpatient   Does not apply q1600   Continuous Infusions: . sodium chloride       LOS: 2 days    Time spent: 30 minutes     Dorcas Carrow, MD [facility] Pager [phone]

## 2020-10-15 NOTE — Evaluation (Signed)
Physical Therapy Evaluation Patient Details Name: Ruben [name] MRN: [id number] DOB: [birth year] Today's Date: 10/15/2020   History of Present Illness  Ruben [name] is a 87 y/o male brought to ED from [facility] with worsening SOB, LE edema, and cough. Pt was admitted on 10/13/20 and diagnosed with acute on chronic CHF. Pt was previously admitted in February s/p IMN L femur on 2/15 for a L hip fracture following a fall at home as he was leaving to undergo a planned transcatheter aortic valve replacement. He was discharged in Feb and receiving rehab at [facility] prior to this admission. PMH includes RBBB, HTN, COPD on home O2, A-fib, and aortic stenosis.    Clinical Impression  Pt received in bed, reporting high levels of L hip pain. Mod A to get into standing, but fairly steady once in standing with RW. Pt limited by pain. Educated on pain management strategies and benefits of exercise. Pt demonstrated understanding. SPO2 stable on 4L. Pt left in chair with all needs met, call bell within reach, chair alarm active, family present, and RN aware of status. Pt would benefit from continued PT to address strength, endurance, and balance. Recommend return to short term rehab. Will continue to follow acutely.     Follow Up Recommendations SNF    Equipment Recommendations  None recommended by PT    Recommendations for Other Services       Precautions / Restrictions Precautions Precautions: Fall Restrictions Weight Bearing Restrictions: Yes LLE Weight Bearing: Weight bearing as tolerated      Mobility  Bed Mobility   Bed Mobility: Supine to Sit     Supine to sit: Mod assist     General bed mobility comments: Mod A to get fully into upright position    Transfers Overall transfer level: Needs assistance Equipment used: Rolling walker (2 wheeled) Transfers: Sit to/from [facility] Sit to Stand: Mod assist Stand pivot transfers: Min assist       General transfer comment:  Mod A to power up into standing, fairly steady in standing needing min A for controlled sitting  Ambulation/Gait             General Gait Details: deferred  Stairs            Wheelchair Mobility    Modified Rankin (Stroke Patients Only)       Balance Overall balance assessment: Needs assistance Sitting-balance support: No upper extremity supported;Feet supported Sitting balance-Leahy Scale: Fair     Standing balance support: Bilateral upper extremity supported;During functional activity Standing balance-Leahy Scale: Poor Standing balance comment: reliant on UE support and external assist                             Pertinent Vitals/Pain Pain Assessment: Faces Faces Pain Scale: Hurts even more Pain Location: L hip Pain Descriptors / Indicators: Sharp;Stabbing Pain Intervention(s): Monitored during session;Repositioned    Home Living Family/patient expects to be discharged to:: Skilled nursing facility                      Prior Function           Comments: Was receiving rehab at [facility] to address strength and endurance following L hip fracture and IMN     Hand Dominance   Dominant Hand: Right    Extremity/Trunk Assessment        Lower Extremity Assessment Lower Extremity Assessment: Generalized weakness  Cervical / Trunk Assessment Cervical / Trunk Assessment: Kyphotic  Communication   Communication: HOH  Cognition Arousal/Alertness: Awake/alert Behavior During Therapy: WFL for tasks assessed/performed Overall Cognitive Status: Within Functional Limits for tasks assessed                                 General Comments: A&Ox4      General Comments General comments (skin integrity, edema, etc.): Received on 4L O2, SPO2 stable on 4L    Exercises     Assessment/Plan    PT Assessment Patient needs continued PT services  PT Problem List Decreased strength;Decreased range of motion;Decreased  activity tolerance;Decreased balance;Decreased mobility;Decreased knowledge of use of DME       PT Treatment Interventions DME instruction;Gait training;Functional mobility training;Therapeutic activities;Stair training;Therapeutic exercise;Balance training;Patient/family education;Neuromuscular re-education    PT Goals (Current goals can be found in the Care Plan section)  Acute Rehab PT Goals Patient Stated Goal: get stronger, less pain in hip PT Goal Formulation: With patient Time For Goal Achievement: 10/29/20 Potential to Achieve Goals: Good    Frequency Min 3X/week   Barriers to discharge        Co-evaluation               AM-PAC PT "6 Clicks" Mobility  Outcome Measure Help needed turning from your back to your side while in a flat bed without using bedrails?: A Little Help needed moving from lying on your back to sitting on the side of a flat bed without using bedrails?: A Lot Help needed moving to and from a bed to a chair (including a wheelchair)?: A Lot Help needed standing up from a chair using your arms (e.g., wheelchair or bedside chair)?: A Lot Help needed to walk in hospital room?: A Little Help needed climbing 3-5 steps with a railing? : A Lot 6 Click Score: 14    End of Session Equipment Utilized During Treatment: Gait belt;Oxygen Activity Tolerance: Patient limited by pain Patient left: with call bell/phone within reach;in chair;with chair alarm set;with family/visitor present Nurse Communication: Mobility status PT Visit Diagnosis: Unsteadiness on feet (R26.81);Muscle weakness (generalized) (M62.81);History of falling (Z91.81)    Time:  -      Charges:         Rosita Kea, SPT

## 2020-10-15 NOTE — Progress Notes (Signed)
ANTICOAGULATION CONSULT NOTE  Pharmacy Consult for Warfarin  Indication: atrial fibrillation  No Known Allergies  Patient Measurements: Height: 5\' 7"  (170.2 cm) Weight: 91.9 kg (202 lb 9.6 oz) IBW/kg (Calculated) : 66.1  Vital Signs: Temp: 97.7 F (36.5 C) (03/12 0746) Temp Source: Oral (03/12 0746) BP: 126/79 (03/12 0746) Pulse Rate: 96 (03/12 0746)  Labs: Recent Labs    10/13/20 1705 10/14/20 0208 10/15/20 0317  HGB 10.9* 10.7*  --   HCT 35.6* 33.6*  --   PLT 333 321  --   LABPROT  --  34.2* 28.8*  INR  --  3.5* 2.8*  CREATININE 0.93 0.90 0.76    Estimated Creatinine Clearance: 65 mL/min (by C-G formula based on SCr of 0.76 mg/dL).   Medical History: Past Medical History:  Diagnosis Date  . Arthritis   . CHF (congestive heart failure) (HCC)   . COPD (chronic obstructive pulmonary disease) (HCC)   . Mitral regurgitation   . Persistent atrial fibrillation (HCC)    on coumadin   . RBBB   . Severe aortic stenosis      Assessment: 87 y/o M on warfarin PTA for afib, presenting to the ED from a nursing facility with SOB/LLE and is being treated for acute on chronic CHF exacerbation. Pharmacy consulted to resume warfarin.   Home warfarin dose is 5 mg daily with last dose 3/9. Was supratherapeutic yesterday at 3.5, and warfarin was held. Today, INR is therapeutic. Hbg, Plt stable. No bleeding noted. Will give small dose of warfarin tonight and assess response tomorrow.  3/12 INR 2.8 - therapeutic   Goal of Therapy:  INR 2-3 Monitor platelets by anticoagulation protocol: Yes   Plan:  Warfarin 1mg  x1 INR with AM labs to assess dosing needs  5/12, PharmD PGY1 Pharmacy Resident 10/15/2020 7:51 AM

## 2020-10-16 LAB — BASIC METABOLIC PANEL
Anion gap: 3 — ABNORMAL LOW (ref 5–15)
BUN: 23 mg/dL (ref 8–23)
CO2: 42 mmol/L — ABNORMAL HIGH (ref 22–32)
Calcium: 8.7 mg/dL — ABNORMAL LOW (ref 8.9–10.3)
Chloride: 94 mmol/L — ABNORMAL LOW (ref 98–111)
Creatinine, Ser: 0.91 mg/dL (ref 0.61–1.24)
GFR, Estimated: >60 mL/min (ref >60)
Glucose, Bld: 110 mg/dL — ABNORMAL HIGH (ref 70–99)
Potassium: 4.7 mmol/L (ref 3.5–5.1)
Sodium: 139 mmol/L (ref 135–145)

## 2020-10-16 LAB — MAGNESIUM: Magnesium: 1.8 mg/dL (ref 1.7–2.4)

## 2020-10-16 LAB — PROTIME-INR
INR: 2.2 — ABNORMAL HIGH (ref 0.8–1.2)
Prothrombin Time: 23.5 seconds — ABNORMAL HIGH (ref 11.4–15.2)

## 2020-10-16 LAB — SARS CORONAVIRUS 2 (TAT 6-24 HRS): SARS Coronavirus 2: NEGATIVE

## 2020-10-16 LAB — PHOSPHORUS: Phosphorus: 3.8 mg/dL (ref 2.5–4.6)

## 2020-10-16 MED ORDER — TORSEMIDE 20 MG PO TABS
40.0000 mg | ORAL_TABLET | Freq: Every day | ORAL | Status: DC
  Administered 2020-10-17: 40 mg via ORAL
  Filled 2020-10-16: qty 2

## 2020-10-16 MED ORDER — HYDROCODONE-ACETAMINOPHEN 5-325 MG PO TABS: 1.0000 | ORAL_TABLET | Freq: Four times a day (QID) | ORAL | 0 refills | Status: AC | PRN

## 2020-10-16 MED ORDER — WARFARIN SODIUM 3 MG PO TABS
3.0000 mg | ORAL_TABLET | Freq: Once | ORAL | Status: AC
  Administered 2020-10-16: 3 mg via ORAL
  Filled 2020-10-16: qty 1

## 2020-10-16 NOTE — TOC Initial Note (Signed)
Transition of Care [facility]) - Initial/Assessment Note    Patient Details  Name: Ruben [name] MRN: [id number] Date of Birth: 11-Dec-1926  Transition of [facility]) CM/SW Contact:    Ruben [name] Phone Number: [phone] 10/16/2020, 12:05 PM  Clinical Narrative:                  CSW met with pt at bedside. Pt confirmed from [facility] and agreeable to return at discharge. Pt requested CSW update son as well.   CSW spoke with son Ruben [name] via phone [phone]). Ruben [name] agreeable with discharge plan and requested TOC reach out tomorrow to confirm discharge.     TOC will continue to follow for discharge needs.      Patient Goals and CMS Choice        Expected Discharge Plan and Services                                                Prior Living Arrangements/Services                       Activities of Daily Living Home Assistive Devices/Equipment: Gilford Rile (specify type) ADL Screening (condition at time of admission) Patient's cognitive ability adequate to safely complete daily activities?: Yes Is the patient deaf or have difficulty hearing?: No Does the patient have difficulty seeing, even when wearing glasses/contacts?: No Does the patient have difficulty concentrating, remembering, or making decisions?: No Patient able to express need for assistance with ADLs?: Yes Does the patient have difficulty dressing or bathing?: Yes Independently performs ADLs?: Yes (appropriate for developmental age) Does the patient have difficulty walking or climbing stairs?: Yes Weakness of Legs: Left Weakness of Arms/Hands: None  Permission Sought/Granted                  Emotional Assessment              Admission diagnosis:  Hypoxia [R09.02] Acute exacerbation of CHF (congestive heart failure) ([place]) [I50.9] Acute on chronic diastolic congestive heart failure ([place]) [I50.33] Patient Active Problem List   Diagnosis Date Noted  . Acute exacerbation of  CHF (congestive heart failure) ([place]) 10/13/2020  . Trochanteric fracture of left femur ([place]) 09/20/2020  . Fall at home, initial encounter 09/20/2020  . BPH (benign prostatic hyperplasia) 09/20/2020  . Mitral regurgitation   . COPD (chronic obstructive pulmonary disease) ([place])   . Acute on chronic congestive heart failure ([place])   . Severe aortic stenosis   . Chronic diastolic CHF (congestive heart failure) ([place]) 07/15/2020  . COPD with acute exacerbation ([place]) 07/15/2020  . Atrial fibrillation, chronic ([place]) 07/15/2020   PCP:  Ruben [name], L.Marlou Sa, MD Pharmacy:   [id number] [place] [id number]: [phone]: [phone]    Social Determinants of Health (SDOH) Interventions    Readmission Risk Interventions No flowsheet data found.

## 2020-10-16 NOTE — Progress Notes (Signed)
OT Cancellation Note  Patient Details Name: Ruben [name] MRN: [id number] DOB: [birth year]   Cancelled Treatment:    Reason Eval/Treat Not Completed: Fatigue/lethargy limiting ability to participate. Attempted twice however patient reporting too tired. Per CM patient to D/C back to [facility] for ST rehab. Will follow up as schedule allows.  Marlyce Huge OT OT pager: [phone]   Carmelia Roller 10/16/2020, 12:24 PM

## 2020-10-16 NOTE — Progress Notes (Signed)
ANTICOAGULATION CONSULT NOTE  Pharmacy Consult for Warfarin  Indication: atrial fibrillation  No Known Allergies  Patient Measurements: Height: 5\' 7"  (170.2 cm) Weight: 91.2 kg (201 lb 1 oz) (scale a) IBW/kg (Calculated) : 66.1  Vital Signs: Temp: 98.6 F (37 C) (03/13 0400) Temp Source: Oral (03/13 0400) BP: 106/70 (03/13 0400) Pulse Rate: 83 (03/13 0400)  Labs: Recent Labs    10/13/20 1705 10/14/20 0208 10/15/20 0317 10/16/20 0513  HGB 10.9* 10.7*  --   --   HCT 35.6* 33.6*  --   --   PLT 333 321  --   --   LABPROT  --  34.2* 28.8* 23.5*  INR  --  3.5* 2.8* 2.2*  CREATININE 0.93 0.90 0.76 0.91    Estimated Creatinine Clearance: 56.9 mL/min (by C-G formula based on SCr of 0.91 mg/dL).   Medical History: Past Medical History:  Diagnosis Date  . Arthritis   . CHF (congestive heart failure) (HCC)   . COPD (chronic obstructive pulmonary disease) (HCC)   . Mitral regurgitation   . Persistent atrial fibrillation (HCC)    on coumadin   . RBBB   . Severe aortic stenosis      Assessment: 87 y/o M on warfarin PTA for afib, presenting to the ED from a nursing facility with SOB/LLE and is being treated for acute on chronic CHF exacerbation. Pharmacy consulted to resume warfarin.   Home warfarin dose is 5 mg daily, and he was supratherapeutic on admit with INR at 3.5. Yesterday, gave 1mg , and INR dropped from 2.8>2.2, but remains therapeutic. Recent Hbg, Plt stable. No bleeding noted. Will give 3mg  tonight.   Goal of Therapy:  INR 2-3 Monitor platelets by anticoagulation protocol: Yes   Plan:  Warfarin 3mg  x1 INR with AM labs to assess dosing needs Follow Hgb, hct, plt Monitor for bleeding  87, PharmD PGY1 Pharmacy Resident 10/16/2020 7:09 AM

## 2020-10-16 NOTE — Progress Notes (Signed)
PROGRESS NOTE    Ruben [name]  RUE:[id number] DOB: [birth year] DOA: 10/13/2020 PCP: Ruben [name], Ruben Saucer, MD    Brief Narrative:  87 year old gentleman with history of severe aortic stenosis, chronic diastolic heart failure, COPD and chronic hypoxia on 2 L oxygen, chronic A. fib on chronic anticoagulation with Coumadin, history of right bundle branch block, recent left hip fracture and currently at a skilled nursing rehab brought back to the ER with complaints of shortness of breath, cough and hypoxemia.  Patient was also noted to have pedal edema.  Does use 2 L oxygen at home.  In the emergency room hemodynamically stable.  X-ray consistent with pulmonary edema.   Assessment & Plan:   Principal Problem:   Acute on chronic congestive heart failure (HCC) Active Problems:   Atrial fibrillation, chronic (HCC)   Severe aortic stenosis   COPD (chronic obstructive pulmonary disease) (HCC)   BPH (benign prostatic hyperplasia)  Acute on chronic diastolic CHF: Also suspect fluid overload secondary to valvular heart disease: Echocardiogram known EF 55-60%.  Patient on torsemide at home.  Started on IV Lasix 40 mg twice daily with good diuresis.  Start mobilizing and monitor.  Patient is euvolemic today.  Will change to torsemide 40 mg daily starting tomorrow morning.  Severe aortic stenosis: Patient has been evaluated for TAVR, setbacks due to acute illnesses.  Followed by cardiology.  Chronic atrial fibrillation: Rate controlled on metoprolol.  Continue.  Therapeutic on Coumadin, pharmacy managing.  Left hip fracture: Persistent pain.  Currently working with therapy at skilled nursing rehab.  Use additional pain medications to facilitate therapy. Will prescribe Vicodin. Continue to work with PT OT.  Is able to transfer back to skilled nursing rehab.   DVT prophylaxis:  warfarin (COUMADIN) tablet 3 mg   Code Status: DNR Family Communication: Patient's son Ruben [name], Ruben [name]. on the phone  3/12. Disposition Plan: Status is: Inpatient  Remains inpatient appropriate because:Inpatient level of care appropriate due to severity of illness   Dispo: The patient is from: SNF              Anticipated d/c is to: SNF              Patient currently is medically stable to transfer to skilled level of care tomorrow morning.   Difficult to place patient No         Consultants:   None  Procedures:   None  Antimicrobials:   None   Subjective: Patient seen and examined.  No overnight events.  Pain is controlled with Vicodin.  Denies any shortness of breath.  He thinks his legs look better today.  Objective: Vitals:   10/15/20 1925 10/16/20 0000 10/16/20 0400 10/16/20 0854  BP: (!) 104/54 132/80 106/70 117/88  Pulse: 91 99 83 90  Resp: 20 16 17    Temp: 98 F (36.7 C) (!) 97.5 F (36.4 C) 98.6 F (37 C)   TempSrc: Oral Oral Oral   SpO2: 97% 97% 97%   Weight:  91.2 kg    Height:        Intake/Output Summary (Last 24 hours) at 10/16/2020 1337 Last data filed at 10/16/2020 0402 Gross per 24 hour  Intake 360 ml  Output 950 ml  Net -590 ml   Filed Weights   10/14/20 1045 10/15/20 0319 10/16/20 0000  Weight: 91.8 kg 91.9 kg 91.2 kg    Examination:  General exam: Appears calm and comfortable.  Not in any distress.  On 3 L oxygen and  looks comfortable. Respiratory system: No added sounds. Cardiovascular system: S1 & S2 heard, RRR.  Patient has loud pansystolic murmur all over the precordium.   1+ edema left leg, trace edema right leg. Left lateral thigh incision with some surrounding edema but no erythema. Gastrointestinal system: Abdomen is nondistended, soft and nontender. No organomegaly or masses felt. Normal bowel sounds heard. Central nervous system: Alert and oriented. No focal neurological deficits. Extremities: Symmetric 5 x 5 power.  Generalized weakness.    Data Reviewed: I have personally reviewed following labs and imaging  studies  CBC: Recent Labs  Lab 10/13/20 1705 10/14/20 0208  WBC 8.2 7.0  NEUTROABS 6.2 4.9  HGB 10.9* 10.7*  HCT 35.6* 33.6*  MCV 101.7* 99.7  PLT 333 321   Basic Metabolic Panel: Recent Labs  Lab 10/12/20 1458 10/13/20 1705 10/14/20 0208 10/15/20 0317 10/16/20 0513  NA 138 138 139 139 139  K 5.3* 4.5 3.8 4.2 4.7  CL 95* 96* 97* 95* 94*  CO2 30* 36* 33* 35* 42*  GLUCOSE 106* 117* 108* 97 110*  BUN 32 25* 25* 24* 23  CREATININE 0.90 0.93 0.90 0.76 0.91  CALCIUM 8.5* 8.7* 8.5* 8.5* 8.7*  MG  --  2.0  --   --  1.8  PHOS  --   --   --   --  3.8   GFR: Estimated Creatinine Clearance: 56.9 mL/min (by C-G formula based on SCr of 0.91 mg/dL). Liver Function Tests: Recent Labs  Lab 10/13/20 1705  AST 19  ALT 16  ALKPHOS 142*  BILITOT 1.4*  PROT 6.5  ALBUMIN 2.8*   No results for input(s): LIPASE, AMYLASE in the last 168 hours. No results for input(s): AMMONIA in the last 168 hours. Coagulation Profile: Recent Labs  Lab 10/14/20 0208 10/15/20 0317 10/16/20 0513  INR 3.5* 2.8* 2.2*   Cardiac Enzymes: No results for input(s): CKTOTAL, CKMB, CKMBINDEX, TROPONINI in the last 168 hours. BNP (last 3 results) Recent Labs    10/12/20 1458  PROBNP 7,048*   HbA1C: No results for input(s): HGBA1C in the last 72 hours. CBG: No results for input(s): GLUCAP in the last 168 hours. Lipid Profile: No results for input(s): CHOL, HDL, LDLCALC, TRIG, CHOLHDL, LDLDIRECT in the last 72 hours. Thyroid Function Tests: No results for input(s): TSH, T4TOTAL, FREET4, T3FREE, THYROIDAB in the last 72 hours. Anemia Panel: No results for input(s): VITAMINB12, FOLATE, FERRITIN, TIBC, IRON, RETICCTPCT in the last 72 hours. Sepsis Labs: No results for input(s): PROCALCITON, LATICACIDVEN in the last 168 hours.  Recent Results (from the past 240 hour(s))  SARS CORONAVIRUS 2 (TAT 6-24 HRS) Nasopharyngeal Nasopharyngeal Swab     Status: None   Collection Time: 10/13/20  7:22 PM    Specimen: Nasopharyngeal Swab  Result Value Ref Range Status   SARS Coronavirus 2 NEGATIVE NEGATIVE Final    Comment: (NOTE) SARS-CoV-2 target nucleic acids are NOT DETECTED.  The SARS-CoV-2 RNA is generally detectable in upper and lower respiratory specimens during the acute phase of infection. Negative results do not preclude SARS-CoV-2 infection, do not rule out co-infections with other pathogens, and should not be used as the sole basis for treatment or other patient management decisions. Negative results must be combined with clinical observations, patient history, and epidemiological information. The expected result is Negative.  Fact Sheet for Patients: HairSlick.no  Fact Sheet for Healthcare Providers: quierodirigir.com  This test is not yet approved or cleared by the [place] and  has been authorized  for detection and/or diagnosis of SARS-CoV-2 by FDA under an Emergency Use Authorization (EUA). This EUA will remain  in effect (meaning this test can be used) for the duration of the COVID-19 declaration under Se ction 564(b)(1) of the Act, 21 U.S.C. section 360bbb-3(b)(1), unless the authorization is terminated or revoked sooner.  Performed at [facility], [address], [place], [place] [address]          Radiology Studies: No results found.      Scheduled Meds: . diclofenac Sodium  2 g Topical TID  . metoprolol succinate  50 mg Oral Daily  . sodium chloride flush  3 mL Intravenous Q12H  . [START ON 10/17/2020] torsemide  40 mg Oral Daily  . warfarin  3 mg Oral ONCE-1600  . Warfarin - Pharmacist Dosing Inpatient   Does not apply q1600   Continuous Infusions: . sodium chloride       LOS: 3 days    Time spent: 30 minutes     Dorcas Carrow, MD [facility] Pager [phone]

## 2020-10-16 NOTE — TOC Progression Note (Addendum)
Transition of Care [facility]) - Progression Note    Patient Details  Name: Ruben [name] MRN: [id number] Date of Birth: 1926-10-06  [facility]) CM/SW Contact  Patrice [place], [place] Phone Number: [phone] 10/16/2020, 11:37 AM  Clinical Narrative:     CSW was alerted that patient can be discharged in the morning. CSW spoke with Venia Minks and confirmed that patient can return in the AM. CSW inquired about a COVID test and was informed that a new COVID was needed. CSW informed MD Ghimire that a new COVID is needed.  TOC team will continue to assist with discharge planning needs.         Expected Discharge Plan and Services                                                 Social Determinants of Health (SDOH) Interventions    Readmission Risk Interventions No flowsheet data found.

## 2020-10-17 DIAGNOSIS — J431 Panlobular emphysema: Secondary | ICD-10-CM

## 2020-10-17 DIAGNOSIS — I350 Nonrheumatic aortic (valve) stenosis: Secondary | ICD-10-CM

## 2020-10-17 DIAGNOSIS — I482 Chronic atrial fibrillation, unspecified: Secondary | ICD-10-CM

## 2020-10-17 LAB — BASIC METABOLIC PANEL
Anion gap: 7 (ref 5–15)
BUN: 21 mg/dL (ref 8–23)
CO2: 39 mmol/L — ABNORMAL HIGH (ref 22–32)
Calcium: 8.4 mg/dL — ABNORMAL LOW (ref 8.9–10.3)
Chloride: 91 mmol/L — ABNORMAL LOW (ref 98–111)
Creatinine, Ser: 0.79 mg/dL (ref 0.61–1.24)
GFR, Estimated: >60 mL/min (ref >60)
Glucose, Bld: 108 mg/dL — ABNORMAL HIGH (ref 70–99)
Potassium: 3.4 mmol/L — ABNORMAL LOW (ref 3.5–5.1)
Sodium: 137 mmol/L (ref 135–145)

## 2020-10-17 LAB — PROTIME-INR
INR: 2.1 — ABNORMAL HIGH (ref 0.8–1.2)
Prothrombin Time: 22.5 seconds — ABNORMAL HIGH (ref 11.4–15.2)

## 2020-10-17 MED ORDER — TORSEMIDE 40 MG PO TABS: 40.0000 mg | ORAL_TABLET | Freq: Every day | ORAL | Status: DC

## 2020-10-17 MED ORDER — POTASSIUM CHLORIDE CRYS ER 20 MEQ PO TBCR
40.0000 meq | EXTENDED_RELEASE_TABLET | Freq: Every day | ORAL | Status: DC
  Administered 2020-10-17: 40 meq via ORAL
  Filled 2020-10-17: qty 2

## 2020-10-17 MED ORDER — WARFARIN SODIUM 2 MG PO TABS: 4.0000 mg | ORAL_TABLET | Freq: Once | ORAL | Status: DC

## 2020-10-17 NOTE — Plan of Care (Signed)
°  Problem: Education: °Goal: Ability to demonstrate management of disease process will improve °Outcome: Adequate for Discharge °Goal: Ability to verbalize understanding of medication therapies will improve °Outcome: Adequate for Discharge °Goal: Individualized Educational Video(s) °Outcome: Adequate for Discharge °  °

## 2020-10-17 NOTE — TOC Transition Note (Signed)
Transition of Care [facility]) - CM/SW Discharge Note   Patient Details  Name: Ruben [name] MRN: [id number] Date of Birth: Jul 23, 1927  Transition of Care [facility]) CM/SW Contact:  Erin Sons, LCSW Phone Number: 10/17/2020, 10:31 AM   Clinical Narrative:     Patient will DC to: [facility] Anticipated DC date: 10/17/20 Family notified: Son Ruben [name] Transport by: Sharin Mons   Per MD patient ready for DC to [facility] . RN, patient, patient's family, and facility notified of DC. Discharge Summary and FL2 sent to facility. RN to call report prior to discharge [phone] Room 605p). DC packet on chart. Ambulance transport requested for patient.   CSW will sign off for now as social work intervention is no longer needed. Please consult [place] again if new needs arise.   Final next level of care: Skilled Nursing Facility Barriers to Discharge: No Barriers Identified   Patient Goals and CMS Choice Patient states their goals for this hospitalization and ongoing recovery are:: return to [facility] CMS Medicare.gov Compare Post Acute Care list provided to:: Patient    Discharge Placement              Patient chooses bed at: [facility] Patient to be transferred to facility by: [facility] Name of family member notified: Son Ruben [name] Patient and family notified of of transfer: 10/17/20  Discharge Plan and Services                                     Social Determinants of Health (SDOH) Interventions     Readmission Risk Interventions No flowsheet data found.

## 2020-10-17 NOTE — Progress Notes (Signed)
Physical Therapy Treatment Patient Details Name: Ruben [name] MRN: [id number] DOB: 1927-01-16 Today's Date: 10/17/2020    History of Present Illness Ruben [name] is a 87 y/o male brought to ED from [facility] with worsening SOB, LE edema, and cough. Pt was admitted on 10/13/20 and diagnosed with acute on chronic CHF. Pt was previously admitted in February s/p IMN L femur on 2/15 for a L hip fracture following a fall at home as he was leaving to undergo a planned transcatheter aortic valve replacement. He was discharged in Feb and receiving rehab at [facility] prior to this admission. PMH includes RBBB, HTN, COPD on home O2, A-fib, and aortic stenosis.    PT Comments    Patient progressing slowly towards PT goals. Requires less assist to stand from eOB today with use of RW for support. Limited by DOe and drop in SP02 with limited activity. SP02 dropped to mid 80s on 4L/min 02 Ruben [name] with minimal activity; HR up to 124 bpm. Fatigues quickly. Tolerated marching in place with Min A and use of RW. 2-3/4 Doe. Eager to get back to rehab so he can get home to his wife. Tolerated there ex and reports improved pain through left hip today. Will follow.   Follow Up Recommendations  SNF     Equipment Recommendations  None recommended by PT    Recommendations for Other Services       Precautions / Restrictions Precautions Precautions: Fall;Other (comment) Precaution Comments: watch 02 Restrictions Weight Bearing Restrictions: Yes LLE Weight Bearing: Weight bearing as tolerated    Mobility  Bed Mobility Overal bed mobility: Needs Assistance Bed Mobility: Sit to Supine       Sit to supine: Mod assist   General bed mobility comments: Assist to bring Les into bed to return to supine; able to assist with scooting self up in bed using Ues.    Transfers Overall transfer level: Needs assistance Equipment used: Rolling walker (2 wheeled) Transfers: Sit to/from Stand Sit to Stand: Min assist;From  elevated surface         General transfer comment: ASsist to power to standing with cues for hand placement and use of momentum. 2-3/4 DOe.  Ambulation/Gait Ambulation/Gait assistance: Min assist   Assistive device: Rolling walker (2 wheeled)       General Gait Details: Able to march in place x10 seconds and side step along side bed with MIn A and use of RW. SP02 dropped to mid 80s on 4L/min 02 Ruben [name], cues provided for pursed lip breathing. HR up to 124 bpm. Fatigues. 2-3/4 DOe.   Stairs             Wheelchair Mobility    Modified Rankin (Stroke Patients Only)       Balance Overall balance assessment: Needs assistance Sitting-balance support: No upper extremity supported;Feet supported Sitting balance-Leahy Scale: Fair     Standing balance support: During functional activity Standing balance-Leahy Scale: Poor Standing balance comment: reliant on UE support and external assist                            Cognition Arousal/Alertness: Awake/alert Behavior During Therapy: WFL for tasks assessed/performed Overall Cognitive Status: Within Functional Limits for tasks assessed                                 General Comments: A&Ox4, appropriate memory issues for age.  Exercises General Exercises - Lower Extremity Ankle Circles/Pumps: Both;10 reps;AROM Long Arc Quad: AROM;Left;10 reps;Seated    General Comments General comments (skin integrity, edema, etc.): SP02 dropped to mid 80s on 4L/min 02 Ruben [name] with activity with DOe. HR up to 124 bpm.      Pertinent Vitals/Pain Pain Assessment: Faces Faces Pain Scale: Hurts a little bit Pain Location: L hip Pain Descriptors / Indicators: Sore;Aching Pain Intervention(s): Repositioned;Monitored during session    Home Living                      Prior Function            PT Goals (current goals can now be found in the care plan section) Progress towards PT goals: Progressing toward  goals (slowly)    Frequency    Min 3X/week      PT Plan Current plan remains appropriate    Co-evaluation              AM-PAC PT "6 Clicks" Mobility   Outcome Measure  Help needed turning from your back to your side while in a flat bed without using bedrails?: A Little Help needed moving from lying on your back to sitting on the side of a flat bed without using bedrails?: A Lot Help needed moving to and from a bed to a chair (including a wheelchair)?: A Little   Help needed to walk in hospital room?: A Little Help needed climbing 3-5 steps with a railing? : A Lot 6 Click Score: 13    End of Session Equipment Utilized During Treatment: Gait belt;Oxygen Activity Tolerance: Treatment limited secondary to medical complications (Comment);Patient limited by fatigue (drop in SP02) Patient left: in bed;with call bell/phone within reach;with bed alarm set Nurse Communication: Mobility status PT Visit Diagnosis: Unsteadiness on feet (R26.81);Muscle weakness (generalized) (M62.81);History of falling (Z91.81);Pain Pain - Right/Left: Left Pain - part of body: Hip     Time: 2458-0998 PT Time Calculation (min) (ACUTE ONLY): 16 min  Charges:  $Therapeutic Activity: 8-22 mins                     Ruben [name], PT, DPT Acute Rehabilitation Services Pager [phone] Office [phone]       Ruben [name] A Ruben [name] 10/17/2020, 8:57 AM

## 2020-10-17 NOTE — Progress Notes (Signed)
ANTICOAGULATION CONSULT NOTE - Follow Up Consult  Pharmacy Consult for Warfarin Indication: atrial fibrillation  No Known Allergies  Patient Measurements: Height: 5\' 7"  (170.2 cm) Weight: 91.5 kg (201 lb 12.8 oz) IBW/kg (Calculated) : 66.1  Vital Signs: Temp: 97.8 F (36.6 C) (03/14 0310) Temp Source: Oral (03/14 0310) BP: 117/64 (03/14 0310) Pulse Rate: 91 (03/14 0310)  Labs: Recent Labs    10/15/20 0317 10/16/20 0513 10/17/20 0322  LABPROT 28.8* 23.5* 22.5*  INR 2.8* 2.2* 2.1*  CREATININE 0.76 0.91 0.79    Estimated Creatinine Clearance: 64.9 mL/min (by C-G formula based on SCr of 0.79 mg/dL).  Assessment: 87 y/o M on warfarin PTA for afib, presenting to the ED from a nursing facility on 10/13/20 with SOB/LLE and is being treated for acute on chronic CHF exacerbation. Pharmacy consulted to resume warfarin.    INR supratherapeutic (3.5) on 3/11 and warfarin held.  INR 2.8 on 3/12 > warfarin resumed with low dose of 1 mg.   INR 2.2 on 3/13 > warfarin 3 mg given.  INR 2.1 today; low therapeutic  Hgb 10.7 and platelet count 321 on 3/11.   PTA warfarin regimen: 5 mg daily (last PTA dose 3/9)  Goal of Therapy:  INR 2-3 Monitor platelets by anticoagulation protocol: Yes   Plan:   Warfarin 4 mg x 1 today.  Daily PT/INR for now.  CBC on 3/15.  4/15, RPh 10/17/2020,9:11 AM

## 2020-10-17 NOTE — Discharge Summary (Signed)
Physician Discharge Summary  Ruben [name] OFB:[id number] DOB: Feb 01, 1927 DOA: 10/13/2020  PCP: Clovis Riley, L.August Saucer, MD  Admit date: 10/13/2020 Discharge date: 10/17/2020  Admitted From: SNF Disposition: SNF  Recommendations for Outpatient Follow-up:  1. Follow up with PCP in 1-2 weeks after discharge 2. Please obtain BMP/CBC in one week 3. Follow-up with cardiology as a scheduled  Home Health: Not applicable Equipment/Devices: Not applicable  Discharge Condition: Stable CODE STATUS: DNR Diet recommendation: Low-salt diet  Discharge summary: 87 year old gentleman with history of severe aortic stenosis, chronic diastolic heart failure, COPD and chronic hypoxia on 2 L oxygen, chronic A. fib on chronic anticoagulation with Coumadin, history of right bundle branch block, recent left hip fracture and currently at a skilled nursing rehab brought back to the ER with complaints of shortness of breath, cough and hypoxemia.  Patient was also noted to have pedal edema.  Does use 2 L oxygen at home.  In the emergency room hemodynamically stable.  X-ray consistent with pulmonary edema.   Assessment & plan of care:   Acute on chronic diastolic CHF: Also suspect fluid overload secondary to valvular heart disease: Echocardiogram known EF 55-60%.  Patient on torsemide at home.  Started on IV Lasix 40 mg twice daily with good diuresis. Patient is euvolemic today.  Will change to torsemide 40 mg daily today.  Potassium supplements.  Patient does have well-established cardiology follow-up.  .  Severe aortic stenosis: Patient has been evaluated for TAVR, setbacks due to acute illnesses.  Followed by cardiology.  Chronic atrial fibrillation: Rate controlled on metoprolol.  Continue.  Therapeutic on Coumadin, pharmacy managing.  COPD with chronic hypoxemic respiratory failure: On 2 to 3 L oxygen at rest.  Currently at his baseline.  Left hip fracture: Surgically healed.  Persistent pain.  Currently working  with therapy at skilled nursing rehab.  Use additional pain medications to facilitate therapy. Will prescribe Vicodin. Continue to work with PT OT.  Is able to transfer back to skilled nursing rehab.   Discharge Diagnoses:  Principal Problem:   Acute on chronic congestive heart failure (HCC) Active Problems:   Atrial fibrillation, chronic (HCC)   Severe aortic stenosis   COPD (chronic obstructive pulmonary disease) (HCC)   BPH (benign prostatic hyperplasia)    Discharge Instructions  Discharge Instructions    Call MD for:  difficulty breathing, headache or visual disturbances   Complete by: As directed    Call MD for:  redness, tenderness, or signs of infection (pain, swelling, redness, odor or green/yellow discharge around incision site)   Complete by: As directed    Call MD for:  severe uncontrolled pain   Complete by: As directed    Diet - low sodium heart healthy   Complete by: As directed    Increase activity slowly   Complete by: As directed    No wound care   Complete by: As directed      Allergies as of 10/17/2020   No Known Allergies     Medication List    TAKE these medications   acetaminophen 500 MG tablet Commonly known as: TYLENOL Take 1 tablet (500 mg total) by mouth every 6 (six) hours as needed for mild pain or moderate pain.   diclofenac Sodium 1 % Gel Commonly known as: VOLTAREN Apply 1 application topically 3 (three) times daily as needed (for pain).   docusate sodium 100 MG capsule Commonly known as: COLACE Take 1 capsule (100 mg total) by mouth 2 (two) times daily.  dutasteride 0.5 MG capsule Commonly known as: AVODART Take 0.5 mg by mouth in the morning and at bedtime.   Ferrous Gluconate 324 (37.5 Fe) MG Tabs Take 1 tablet by mouth every other day.   HYDROcodone-acetaminophen 5-325 MG tablet Commonly known as: NORCO/VICODIN Take 1 tablet by mouth every 6 (six) hours as needed for up to 5 days for moderate pain or severe pain.    levalbuterol 0.63 MG/3ML nebulizer solution Commonly known as: XOPENEX Take 0.63 mg by nebulization 2 (two) times daily.   levalbuterol 45 MCG/ACT inhaler Commonly known as: XOPENEX HFA Inhale 1 puff into the lungs every 4 (four) hours as needed for wheezing or shortness of breath.   metoprolol succinate 50 MG 24 hr tablet Commonly known as: TOPROL-XL Take 50 mg by mouth daily.   montelukast 10 MG tablet Commonly known as: SINGULAIR Take 10 mg by mouth at bedtime.   polyethylene glycol 17 g packet Commonly known as: MIRALAX / GLYCOLAX Take 17 g by mouth daily as needed for mild constipation.   polyvinyl alcohol 1.4 % ophthalmic solution Commonly known as: LIQUIFILM TEARS Place 1 drop into both eyes 2 (two) times daily as needed for dry eyes.   potassium chloride SA 20 MEQ tablet Commonly known as: KLOR-CON Take 1 tablet (20 mEq total) by mouth daily.   tamsulosin 0.4 MG Caps capsule Commonly known as: FLOMAX Take 0.8 mg by mouth at bedtime.   Torsemide 40 MG Tabs Take 40 mg by mouth daily. Start taking on: October 18, 2020 What changed:   medication strength  how much to take  how to take this  when to take this  additional instructions   vitamin B-12 500 MCG tablet Commonly known as: CYANOCOBALAMIN Take 500 mcg by mouth daily.   warfarin 5 MG tablet Commonly known as: COUMADIN Take 5 mg by mouth daily.       No Known Allergies  Consultations:  None   Procedures/Studies: DG Pelvis 1-2 Views  Result Date: 09/20/2020 CLINICAL DATA:  Fall this morning.  Left hip pain EXAM: PELVIS - 1-2 VIEW COMPARISON:  None. FINDINGS: Acute intertrochanteric left femur fracture with medial impaction. No evidence of pelvic ring fracture or diastasis. Osteopenia and atherosclerosis. IMPRESSION: Acute intertrochanteric left femur fracture. Electronically Signed   By: Marnee Spring M.D.   On: 09/20/2020 09:05   DG Chest Portable 1 View  Result Date:  10/13/2020 CLINICAL DATA:  Shortness of breath and lower extremity edema. EXAM: PORTABLE CHEST 1 VIEW COMPARISON:  October 13, 2020 (5:17 p.m.) FINDINGS: Stable moderate to marked severity diffusely increased interstitial lung markings are noted. A small left pleural effusion is suspected. No pneumothorax is identified. The cardiac silhouette is markedly enlarged. Marked severity calcification of the aortic arch is seen. Degenerative changes are noted throughout the thoracic spine. IMPRESSION: Cardiomegaly with pulmonary edema, stable in severity when compared to the prior study. Electronically Signed   By: Aram Candela M.D.   On: 10/13/2020 22:51   DG Chest Port 1 View  Result Date: 10/13/2020 CLINICAL DATA:  Shortness of breath.  Lower extremity edema. EXAM: PORTABLE CHEST 1 VIEW COMPARISON:  09/16/2020 FINDINGS: Chronic cardiomegaly. Unchanged mediastinal contours with aortic atherosclerosis. Interstitial thickening and Kerley B-lines consistent with pulmonary edema. Small bilateral pleural effusions are similar to prior exam. No pneumothorax. No evidence of pneumonia. IMPRESSION: 1. Pulmonary edema is new from last month. 2. Cardiomegaly and bilateral pleural effusions appear similar. Electronically Signed   By: Ivette Loyal.D.  On: 10/13/2020 17:58   DG C-Arm 1-60 Min  Result Date: 09/20/2020 CLINICAL DATA:  Left femur IM nail. EXAM: OPERATIVE LEFT HIP (WITH PELVIS IF PERFORMED) TECHNIQUE: Fluoroscopic spot image(s) were submitted for interpretation post-operatively. COMPARISON:  Preoperative radiograph. FINDINGS: Four fluoroscopic spot views of the left femur obtained in the operating room. Intramedullary nail with trans trochanteric and distal locking screws fixate intertrochanteric femur fracture. Total fluoroscopy time 1 minutes 38 seconds. IMPRESSION: Procedural fluoroscopy after left femur IM nail. Electronically Signed   By: Narda Rutherford M.D.   On: 09/20/2020 20:57   DG Hip Port  Unilat With Pelvis 1V Left  Result Date: 09/20/2020 CLINICAL DATA:  Status post IM nail. EXAM: DG HIP (WITH OR WITHOUT PELVIS) 1V PORT LEFT COMPARISON:  Preoperative radiographs earlier this day. FINDINGS: Intramedullary nail with trans trochanteric and distal screw fixation of intertrochanteric femur fracture. Improved fracture alignment from preoperative imaging. Lesser trochanteric fragment remains displaced. Knee arthroplasty partially included. Recent postsurgical change includes air and edema in the adjacent soft tissues. IMPRESSION: ORIF of intertrochanteric left femur fracture, in improved alignment from preoperative imaging. No immediate postoperative complication. Electronically Signed   By: Narda Rutherford M.D.   On: 09/20/2020 19:12   DG HIP OPERATIVE UNILAT W OR W/O PELVIS LEFT  Result Date: 09/20/2020 CLINICAL DATA:  Left femur IM nail. EXAM: OPERATIVE LEFT HIP (WITH PELVIS IF PERFORMED) TECHNIQUE: Fluoroscopic spot image(s) were submitted for interpretation post-operatively. COMPARISON:  Preoperative radiograph. FINDINGS: Four fluoroscopic spot views of the left femur obtained in the operating room. Intramedullary nail with trans trochanteric and distal locking screws fixate intertrochanteric femur fracture. Total fluoroscopy time 1 minutes 38 seconds. IMPRESSION: Procedural fluoroscopy after left femur IM nail. Electronically Signed   By: Narda Rutherford M.D.   On: 09/20/2020 20:57   DG FEMUR MIN 2 VIEWS LEFT  Result Date: 09/20/2020 CLINICAL DATA:  Fall this morning.  Left hip pain EXAM: LEFT FEMUR 2 VIEWS COMPARISON:  None. FINDINGS: Acute intertrochanteric left femur fracture with medial impaction. Knee arthroplasty without acute superimposed finding. Generalized osteopenia and atherosclerosis. IMPRESSION: Intertrochanteric left femur fracture with medial impaction. Electronically Signed   By: Marnee Spring M.D.   On: 09/20/2020 09:04   (Echo, Carotid, EGD, Colonoscopy, ERCP)     Subjective: Patient seen and examined.  No overnight events.  Remains sinus rhythm on telemetry.  Pain is controlled and used Vicodin before working with therapist.   Discharge Exam: Vitals:   10/17/20 0310 10/17/20 0900  BP: 117/64 109/71  Pulse: 91 92  Resp: (!) 24 (!) 25  Temp: 97.8 F (36.6 C)   SpO2: 96% 97%   Vitals:   10/16/20 1512 10/16/20 1920 10/17/20 0310 10/17/20 0900  BP: (!) 94/55 112/66 117/64 109/71  Pulse: 83 89 91 92  Resp: (!) 24 18 (!) 24 (!) 25  Temp: 97.8 F (36.6 C) 98 F (36.7 C) 97.8 F (36.6 C)   TempSrc: Oral Oral Oral   SpO2: 97% 97% 96% 97%  Weight:   91.5 kg   Height:        General: Pt is alert, awake, not in acute distress Cardiovascular: RRR, S1/S2 +, loud pansystolic murmur all over the left precordium.  No crackles. Respiratory: CTA bilaterally, no wheezing, no rhonchi on 2 L oxygen. Abdominal: Soft, NT, ND, bowel sounds + Extremities: Left lower extremity 1+ edema.  Right lower extremity trace edema. Left lateral surgical incision healed.  Minimal periincisional edema present.  Nontender.    The results  of significant diagnostics from this hospitalization (including imaging, microbiology, ancillary and laboratory) are listed below for reference.     Microbiology: Recent Results (from the past 240 hour(s))  SARS CORONAVIRUS 2 (TAT 6-24 HRS) Nasopharyngeal Nasopharyngeal Swab     Status: None   Collection Time: 10/13/20  7:22 PM   Specimen: Nasopharyngeal Swab  Result Value Ref Range Status   SARS Coronavirus 2 NEGATIVE NEGATIVE Final    Comment: (NOTE) SARS-CoV-2 target nucleic acids are NOT DETECTED.  The SARS-CoV-2 RNA is generally detectable in upper and lower respiratory specimens during the acute phase of infection. Negative results do not preclude SARS-CoV-2 infection, do not rule out co-infections with other pathogens, and should not be used as the sole basis for treatment or other patient management  decisions. Negative results must be combined with clinical observations, patient history, and epidemiological information. The expected result is Negative.  Fact Sheet for Patients: HairSlick.nohttps://www.fda.gov/media/138098/download  Fact Sheet for Healthcare Providers: quierodirigir.comhttps://www.fda.gov/media/138095/download  This test is not yet approved or cleared by the [place] FDA and  has been authorized for detection and/or diagnosis of SARS-CoV-2 by FDA under an Emergency Use Authorization (EUA). This EUA will remain  in effect (meaning this test can be used) for the duration of the COVID-19 declaration under Se ction 564(b)(1) of the Act, 21 U.S.C. section 360bbb-3(b)(1), unless the authorization is terminated or revoked sooner.  Performed at [facility], [address] [place], [place] [address]   SARS CORONAVIRUS 2 (TAT 6-24 HRS) Nasopharyngeal Nasopharyngeal Swab     Status: None   Collection Time: 10/16/20 12:59 PM   Specimen: Nasopharyngeal Swab  Result Value Ref Range Status   SARS Coronavirus 2 NEGATIVE NEGATIVE Final    Comment: (NOTE) SARS-CoV-2 target nucleic acids are NOT DETECTED.  The SARS-CoV-2 RNA is generally detectable in upper and lower respiratory specimens during the acute phase of infection. Negative results do not preclude SARS-CoV-2 infection, do not rule out co-infections with other pathogens, and should not be used as the sole basis for treatment or other patient management decisions. Negative results must be combined with clinical observations, patient history, and epidemiological information. The expected result is Negative.  Fact Sheet for Patients: HairSlick.nohttps://www.fda.gov/media/138098/download  Fact Sheet for Healthcare Providers: quierodirigir.comhttps://www.fda.gov/media/138095/download  This test is not yet approved or cleared by the [place] FDA and  has been authorized for detection and/or diagnosis of SARS-CoV-2 by FDA under an Emergency Use  Authorization (EUA). This EUA will remain  in effect (meaning this test can be used) for the duration of the COVID-19 declaration under Se ction 564(b)(1) of the Act, 21 U.S.C. section 360bbb-3(b)(1), unless the authorization is terminated or revoked sooner.  Performed at [facility], [address] [place], [place] [address]      Labs: BNP (last 3 results) Recent Labs    07/15/20 1256 10/13/20 1705  BNP 487.9* 717.2*   Basic Metabolic Panel: Recent Labs  Lab 10/13/20 1705 10/14/20 0208 10/15/20 0317 10/16/20 0513 10/17/20 0322  NA 138 139 139 139 137  K 4.5 3.8 4.2 4.7 3.4*  CL 96* 97* 95* 94* 91*  CO2 36* 33* 35* 42* 39*  GLUCOSE 117* 108* 97 110* 108*  BUN 25* 25* 24* 23 21  CREATININE 0.93 0.90 0.76 0.91 0.79  CALCIUM 8.7* 8.5* 8.5* 8.7* 8.4*  MG 2.0  --   --  1.8  --   PHOS  --   --   --  3.8  --  Liver Function Tests: Recent Labs  Lab 10/13/20 1705  AST 19  ALT 16  ALKPHOS 142*  BILITOT 1.4*  PROT 6.5  ALBUMIN 2.8*   No results for input(s): LIPASE, AMYLASE in the last 168 hours. No results for input(s): AMMONIA in the last 168 hours. CBC: Recent Labs  Lab 10/13/20 1705 10/14/20 0208  WBC 8.2 7.0  NEUTROABS 6.2 4.9  HGB 10.9* 10.7*  HCT 35.6* 33.6*  MCV 101.7* 99.7  PLT 333 321   Cardiac Enzymes: No results for input(s): CKTOTAL, CKMB, CKMBINDEX, TROPONINI in the last 168 hours. BNP: Invalid input(s): POCBNP CBG: No results for input(s): GLUCAP in the last 168 hours. D-Dimer No results for input(s): DDIMER in the last 72 hours. Hgb A1c No results for input(s): HGBA1C in the last 72 hours. Lipid Profile No results for input(s): CHOL, HDL, LDLCALC, TRIG, CHOLHDL, LDLDIRECT in the last 72 hours. Thyroid function studies No results for input(s): TSH, T4TOTAL, T3FREE, THYROIDAB in the last 72 hours.  Invalid input(s): FREET3 Anemia work up No results for input(s): VITAMINB12, FOLATE, FERRITIN, TIBC, IRON, RETICCTPCT in the  last 72 hours. Urinalysis    Component Value Date/Time   COLORURINE YELLOW 10/13/2020 1705   APPEARANCEUR CLEAR 10/13/2020 1705   LABSPEC 1.011 10/13/2020 1705   PHURINE 6.0 10/13/2020 1705   GLUCOSEU NEGATIVE 10/13/2020 1705   HGBUR MODERATE (A) 10/13/2020 1705   BILIRUBINUR NEGATIVE 10/13/2020 1705   KETONESUR NEGATIVE 10/13/2020 1705   PROTEINUR NEGATIVE 10/13/2020 1705   NITRITE NEGATIVE 10/13/2020 1705   LEUKOCYTESUR NEGATIVE 10/13/2020 1705   Sepsis Labs Invalid input(s): PROCALCITONIN,  WBC,  LACTICIDVEN Microbiology Recent Results (from the past 240 hour(s))  SARS CORONAVIRUS 2 (TAT 6-24 HRS) Nasopharyngeal Nasopharyngeal Swab     Status: None   Collection Time: 10/13/20  7:22 PM   Specimen: Nasopharyngeal Swab  Result Value Ref Range Status   SARS Coronavirus 2 NEGATIVE NEGATIVE Final    Comment: (NOTE) SARS-CoV-2 target nucleic acids are NOT DETECTED.  The SARS-CoV-2 RNA is generally detectable in upper and lower respiratory specimens during the acute phase of infection. Negative results do not preclude SARS-CoV-2 infection, do not rule out co-infections with other pathogens, and should not be used as the sole basis for treatment or other patient management decisions. Negative results must be combined with clinical observations, patient history, and epidemiological information. The expected result is Negative.  Fact Sheet for Patients: HairSlick.no  Fact Sheet for Healthcare Providers: quierodirigir.com  This test is not yet approved or cleared by the [place] FDA and  has been authorized for detection and/or diagnosis of SARS-CoV-2 by FDA under an Emergency Use Authorization (EUA). This EUA will remain  in effect (meaning this test can be used) for the duration of the COVID-19 declaration under Se ction 564(b)(1) of the Act, 21 U.S.C. section 360bbb-3(b)(1), unless the authorization is terminated  or revoked sooner.  Performed at [facility], [address] [place], [place] [address]   SARS CORONAVIRUS 2 (TAT 6-24 HRS) Nasopharyngeal Nasopharyngeal Swab     Status: None   Collection Time: 10/16/20 12:59 PM   Specimen: Nasopharyngeal Swab  Result Value Ref Range Status   SARS Coronavirus 2 NEGATIVE NEGATIVE Final    Comment: (NOTE) SARS-CoV-2 target nucleic acids are NOT DETECTED.  The SARS-CoV-2 RNA is generally detectable in upper and lower respiratory specimens during the acute phase of infection. Negative results do not preclude SARS-CoV-2 infection, do not rule out co-infections with other pathogens, and  should not be used as the sole basis for treatment or other patient management decisions. Negative results must be combined with clinical observations, patient history, and epidemiological information. The expected result is Negative.  Fact Sheet for Patients: HairSlick.no  Fact Sheet for Healthcare Providers: quierodirigir.com  This test is not yet approved or cleared by the [place] FDA and  has been authorized for detection and/or diagnosis of SARS-CoV-2 by FDA under an Emergency Use Authorization (EUA). This EUA will remain  in effect (meaning this test can be used) for the duration of the COVID-19 declaration under Se ction 564(b)(1) of the Act, 21 U.S.C. section 360bbb-3(b)(1), unless the authorization is terminated or revoked sooner.  Performed at [facility], [address], [place], [place] [address]      Time coordinating discharge:  35 minutes  SIGNED:   Dorcas Carrow, MD  [facility] 10/17/2020, 10:16 AM

## 2020-10-17 NOTE — Discharge Instructions (Signed)
Information on my medicine - Coumadin®   (Warfarin) ° °Why was Coumadin prescribed for you? °Coumadin was prescribed for you because you have a blood clot or a medical condition that can cause an increased risk of forming blood clots. Blood clots can cause serious health problems by blocking the flow of blood to the heart, lung, or brain. Coumadin can prevent harmful blood clots from forming. °As a reminder your indication for Coumadin is:   Stroke Prevention Because Of Atrial Fibrillation ° °What test will check on my response to Coumadin? °While on Coumadin (warfarin) you will need to have an INR test regularly to ensure that your dose is keeping you in the desired range. The INR (international normalized ratio) number is calculated from the result of the laboratory test called prothrombin time (PT). ° °If an INR APPOINTMENT HAS NOT ALREADY BEEN MADE FOR YOU please schedule an appointment to have this lab work done by your health care provider within 7 days. °Your INR goal is usually a number between:  2 to 3 or your provider may give you a more narrow range like 2-2.5.  Ask your health care provider during an office visit what your goal INR is. ° °What  do you need to  know  About  COUMADIN? °Take Coumadin (warfarin) exactly as prescribed by your healthcare provider about the same time each day.  DO NOT stop taking without talking to the doctor who prescribed the medication.  Stopping without other blood clot prevention medication to take the place of Coumadin may increase your risk of developing a new clot or stroke.  Get refills before you run out. ° °What do you do if you miss a dose? °If you miss a dose, take it as soon as you remember on the same day then continue your regularly scheduled regimen the next day.  Do not take two doses of Coumadin at the same time. ° °Important Safety Information °A possible side effect of Coumadin (Warfarin) is an increased risk of bleeding. You should call your healthcare  provider right away if you experience any of the following: °? Bleeding from an injury or your nose that does not stop. °? Unusual colored urine (red or dark brown) or unusual colored stools (red or black). °? Unusual bruising for unknown reasons. °? A serious fall or if you hit your head (even if there is no bleeding). ° °Some foods or medicines interact with Coumadin® (warfarin) and might alter your response to warfarin. To help avoid this: °? Eat a balanced diet, maintaining a consistent amount of Vitamin K. °? Notify your provider about major diet changes you plan to make. °? Avoid alcohol or limit your intake to 1 drink for women and 2 drinks for men per day. °(1 drink is 5 oz. wine, 12 oz. beer, or 1.5 oz. liquor.) ° °Make sure that ANY health care provider who prescribes medication for you knows that you are taking Coumadin (warfarin).  Also make sure the healthcare provider who is monitoring your Coumadin knows when you have started a new medication including herbals and non-prescription products. ° °Coumadin® (Warfarin)  Major Drug Interactions  °Increased Warfarin Effect Decreased Warfarin Effect  °Alcohol (large quantities) °Antibiotics (esp. Septra/Bactrim, Flagyl, Cipro) °Amiodarone (Cordarone) °Aspirin (ASA) °Cimetidine (Tagamet) °Megestrol (Megace) °NSAIDs (ibuprofen, naproxen, etc.) °Piroxicam (Feldene) °Propafenone (Rythmol SR) °Propranolol (Inderal) °Isoniazid (INH) °Posaconazole (Noxafil) Barbiturates (Phenobarbital) °Carbamazepine (Tegretol) °Chlordiazepoxide (Librium) °Cholestyramine (Questran) °Griseofulvin °Oral Contraceptives °Rifampin °Sucralfate (Carafate) °Vitamin K  ° °Coumadin® (Warfarin) Major Herbal   Interactions  °Increased Warfarin Effect Decreased Warfarin Effect  °Garlic °Ginseng °Ginkgo biloba Coenzyme Q10 °Green tea °St. Asriel’s wort   ° °Coumadin® (Warfarin) FOOD Interactions  °Eat a consistent number of servings per week of foods HIGH in Vitamin K °(1 serving = ½ cup)  °Collards  (cooked, or boiled & drained) °Kale (cooked, or boiled & drained) °Mustard greens (cooked, or boiled & drained) °Parsley *serving size only = ¼ cup °Spinach (cooked, or boiled & drained) °Swiss chard (cooked, or boiled & drained) °Turnip greens (cooked, or boiled & drained)  °Eat a consistent number of servings per week of foods MEDIUM-HIGH in Vitamin K °(1 serving = 1 cup)  °Asparagus (cooked, or boiled & drained) °Broccoli (cooked, boiled & drained, or raw & chopped) °Brussel sprouts (cooked, or boiled & drained) *serving size only = ½ cup °Lettuce, raw (green leaf, endive, romaine) °Spinach, raw °Turnip greens, raw & chopped  ° °These websites have more information on Coumadin (warfarin):  www.coumadin.com; °www.ahrq.gov/consumer/coumadin.htm; ° ° °

## 2020-10-17 NOTE — Care Management Important Message (Signed)
Important Message  Patient Details  Name: Ruben [name] MRN: [id number] Date of Birth: [birth year]   Medicare Important Message Given:  Yes  Pt. On Airborne and Contact precautions gave to RN.Marland Kitchen     [name] 10/17/2020, 9:10 AM

## 2020-10-17 NOTE — Progress Notes (Signed)
D/C instructions printed and placed in packet. Tele and IV removed, tolerated well. Awaiting transportation.

## 2020-10-20 ENCOUNTER — Encounter: Payer: Self-pay | Admitting: Physician Assistant

## 2020-10-20 ENCOUNTER — Other Ambulatory Visit: Payer: Self-pay

## 2020-10-20 ENCOUNTER — Ambulatory Visit (INDEPENDENT_AMBULATORY_CARE_PROVIDER_SITE_OTHER): Payer: Medicare Other | Admitting: Physician Assistant

## 2020-10-20 VITALS — BP 104/54 | HR 40 | Ht 66.0 in | Wt 202.0 lb

## 2020-10-20 DIAGNOSIS — S72002F Fracture of unspecified part of neck of left femur, subsequent encounter for open fracture type IIIA, IIIB, or IIIC with routine healing: Secondary | ICD-10-CM

## 2020-10-20 DIAGNOSIS — I482 Chronic atrial fibrillation, unspecified: Secondary | ICD-10-CM | POA: Diagnosis not present

## 2020-10-20 DIAGNOSIS — I350 Nonrheumatic aortic (valve) stenosis: Secondary | ICD-10-CM | POA: Diagnosis not present

## 2020-10-20 DIAGNOSIS — I5030 Unspecified diastolic (congestive) heart failure: Secondary | ICD-10-CM | POA: Diagnosis not present

## 2020-10-20 NOTE — Progress Notes (Signed)
HEART Ruben [name]                                     Cardiology Office Note:    Date:  10/21/2020   ID:  Ruben [name], DOB [birth year], MRN [id number]  PCP:  Ruben [name], L.Ruben Sa, MD  [facility] Cardiologist:  Dr. Audie [name] (previously Dr. Meda [name]) [place] Electrophysiologist:  None   Referring MD: Ruben [name], *   Follow up heart failure.   History of Present Illness:    Ruben [name] is a 87 y.o. male with a hx of chronic diastolic [name], Ruben [name], Ruben [name], Ruben [name], Ruben [name], Ruben [name], Ruben [name], Ruben [name], Ruben [name] presents to clinic for follow up.   Patient has remained functionally independent Ruben remarkably active for a gentleman his age. He has history of Ruben persistent atrial fibrillation of uncertain duration.  He has been anticoagulated using [name] for many years without any history of stroke or bleeding complication.  For the last several years he has been told that he has a heart murmur Ruben aortic stenosis.  Up until recently he has been followed by cardiologist in [place].    The patient relocated to [place] to live near his son. In 07/2020 he was hospitalized with acute hypoxic respiratory failure felt related to a combination of Ruben with acute exacerbation, rhinovirus infection, Ruben acute on chronic diastolic congestive heart failure. Symptoms improved with antibiotics, diuresis, Ruben nebulized bronchodilators.  Transthoracic echocardiogram performed at that time revealed normal left ventricular systolic function with Ruben aortic stenosis Ruben what was initially felt to be mild Ruben [name].  He was ultimately discharged home on home oxygen therapy. He was referred to the multidisciplinary  heart valve clinic Ruben was evaluated by Dr. Burt [name]. Diagnostic cardiac catheterization 08/24/20 revealed diffuse calcific nonobstructive coronary artery disease with Ruben aortic stenosis Ruben mild pulmonary hypertension. TEE confirmed presence of findings consistent with Ruben aortic stenosis.  TEE also revealed Ruben Ruben [name] with a small flail segment of the middle scallop of the posterior leaflet Ruben an eccentric jet of Ruben [name] with flow reversal in the pulmonary veins. There was Ruben Ruben annular calcification Ruben functional anatomy of the valve did not appear amenable to transcatheter edge-to-edge repair.   The patient has been evaluated by the multidisciplinary valve team Ruben felt to have Ruben, symptomatic aortic stenosis Ruben to be a suitable candidate for [name], which was set up for 09/20/20. On his way to the hospital for his valve surgery on 2/15, the patient suffered a mechanical fall while trying to get into the car that resulted in a hip fracture. He underwent successful ORIF by Dr. Percell [name] on 09/21/20. He was discharged to [place] home for short term rehabilitation. I saw him in the office on 10/12/20 for follow up. He was noted to be in acute heart failure with 20 lb weight gain Ruben making slow progress with hip rehabilitation. Outpatient diuresis was attempted but he was ultimately admitted from 3/10-3/14/22 for IV diuresis, discharge weight 201.7 lbs.  Today he presents to clinic for follow up. He is here with his son, Ruben [name]. Doing much better in terms of breathing Ruben hip rehabilitation. He  is down 10 lbs Ruben LE edema much improved, although still present L>R. No more orthopnea or PND. Able to ambulate more with the use of a walker. Still living at [facility]. Anxious to get [name] done.    Past Medical History:  Diagnosis Date  . [name]   . [name] (congestive heart failure) ([facility])   . Ruben (chronic obstructive pulmonary disease) ([place])   . Ruben  [name]   . Persistent atrial fibrillation (HCC)    on coumadin   . Ruben [name]   . Ruben aortic stenosis     Past Surgical History:  Procedure Laterality Date  . APPENDECTOMY    . BUBBLE STUDY  09/07/2020   Procedure: BUBBLE STUDY;  Surgeon: Ruben Rile, MD;  Location: [place];  Service: Cardiovascular;;  . CARDIAC CATHETERIZATION    . CLAVICLE SURGERY    . INTRAMEDULLARY (IM) NAIL INTERTROCHANTERIC Left 09/20/2020   Procedure: INTRAMEDULLARY (IM) NAIL INTERTROCHANTRIC;  Surgeon: Ruben Butters, MD;  Location: [place];  Service: Orthopedics;  Laterality: Left;  . RIGHT/LEFT HEART CATH Ruben CORONARY ANGIOGRAPHY N/A 08/24/2020   Procedure: RIGHT/LEFT HEART CATH Ruben CORONARY ANGIOGRAPHY;  Surgeon: Ruben Mocha, MD;  Location: [place] CV [facility];  Service: Cardiovascular;  Laterality: N/A;  . TEE WITHOUT CARDIOVERSION N/A 09/07/2020   Procedure: TRANSESOPHAGEAL ECHOCARDIOGRAM (TEE);  Surgeon: Ruben Rile, MD;  Location: [place];  Service: Cardiovascular;  Laterality: N/A;  . TOTAL KNEE ARTHROPLASTY Bilateral     Current Medications: Current Meds  Medication Sig  . acetaminophen (TYLENOL) 500 MG tablet Take 1 tablet (500 mg total) by mouth every 6 (six) hours as needed for mild pain or moderate pain.  Marland Kitchen diclofenac Sodium (VOLTAREN) 1 % GEL Apply 1 application topically 3 (three) times daily as needed (for pain).  Marland Kitchen docusate sodium (COLACE) 100 MG capsule Take 1 capsule (100 mg total) by mouth 2 (two) times daily.  Marland Kitchen dutasteride (AVODART) 0.5 MG capsule Take 0.5 mg by mouth in the morning Ruben at bedtime.  . Ferrous Gluconate 324 (37.5 Fe) MG TABS Take 1 tablet by mouth every other day.  Marland Kitchen HYDROcodone-acetaminophen (NORCO/VICODIN) 5-325 MG tablet Take 1 tablet by mouth every 6 (six) hours as needed for up to 5 days for moderate pain or Ruben pain.  Marland Kitchen levalbuterol (XOPENEX HFA) 45 MCG/ACT inhaler Inhale 1 puff into the lungs every 4 (four) hours as needed for  wheezing or shortness of breath.  . levalbuterol (XOPENEX) 0.63 MG/3ML nebulizer solution Take 0.63 mg by nebulization 2 (two) times daily.  . metoprolol succinate (TOPROL-XL) 50 MG 24 hr tablet Take 50 mg by mouth daily.  . montelukast (SINGULAIR) 10 MG tablet Take 10 mg by mouth at bedtime.  . polyethylene glycol (MIRALAX / GLYCOLAX) 17 g packet Take 17 g by mouth daily as needed for mild constipation.  . polyvinyl alcohol (LIQUIFILM TEARS) 1.4 % ophthalmic solution Place 1 drop into both eyes 2 (two) times daily as needed for dry eyes.  . potassium chloride [name] (KLOR-CON) 20 MEQ tablet Take 1 tablet (20 mEq total) by mouth daily.  Marland Kitchen SPIRIVA RESPIMAT 2.5 MCG/ACT AERS Inhale 2 puffs into the lungs as needed.  . tamsulosin (FLOMAX) 0.4 MG CAPS capsule Take 0.8 mg by mouth at bedtime.  . torsemide (DEMADEX) 20 MG tablet Take 40 mg by mouth at bedtime.  . torsemide 40 MG TABS Take 40 mg by mouth daily.  . vitamin B-12 (CYANOCOBALAMIN) 500 MCG tablet Take 500 mcg by mouth daily.  Marland Kitchen  [name] (COUMADIN) 5 MG tablet Take 5 mg by mouth daily.     Allergies:   Patient has no known allergies.   Social History   Socioeconomic History  . Marital status: Married    Spouse name: Not on file  . Number of children: Not on file  . Years of education: Not on file  . Highest education level: Not on file  Occupational History  . Occupation: retired  Tobacco Use  . Smoking status: Former Smoker    Packs/day: 1.00    Years: 30.00    Pack years: 30.00    Quit date: 1975    Years since quitting: 47.2  . Smokeless tobacco: Never Used  Vaping Use  . Vaping Use: Never used  Substance Ruben Sexual Activity  . Alcohol use: Not Currently  . Drug use: Never  . Sexual activity: Not on file  Other Topics Concern  . Not on file  Social History Narrative  . Not on file   Social Determinants of Health   Financial Resource Strain: Not on file  Food Insecurity: No Food Insecurity  . Worried About Paediatric nurse in the Last Year: Never true  . Ran Out of Food in the Last Year: Never true  Transportation Needs: No Transportation Needs  . Lack of Transportation (Medical): No  . Lack of Transportation (Non-Medical): No  Physical Activity: Not on file  Stress: Not on file  Social Connections: Not on file     Family History: The patient's family history is not on file.  ROS:   Please see the history of present illness.    All other systems reviewed Ruben are negative.  EKGs/Labs/Other Studies Reviewed:    The following studies were reviewed today:  ECHOCARDIOGRAM REPORT    Patient Name:  NICKHOLAS GOLDSTON Date of Exam: 07/16/2020  Medical Rec #: [id number] Height:    67.0 in  Accession #:  [id number] Weight:    179.2 lb  Date of Birth: 11-Jul-1927  BSA:     1.930 m  Patient Age:  57 years  BP:      94/56 mmHg  Patient Gender: M     HR:      71 bpm.  Exam Location: Inpatient   Procedure: 2D Echo, Cardiac Doppler Ruben Color Doppler   Indications:  I50.33 Acute on chronic diastolic (congestive) heart  failure    History:    Patient has no prior history of Echocardiogram  examinations.         Ruben; Arrythmias:Atrial Fibrillation.    Sonographer:  Jonelle Sidle Dance  Referring Phys: [place]    1. Left ventricular ejection fraction, by estimation, is 55 to 60%. The  left ventricle has normal function. The left ventricle has no regional  wall motion abnormalities. There is moderate left ventricular hypertrophy.  Left ventricular diastolic  parameters are indeterminate.  2. Right ventricular systolic function is moderately reduced. The right  ventricular size is mildly enlarged. There is mildly elevated pulmonary  artery systolic pressure.  3. Left atrial size was moderately dilated.  4. Mild functional Ruben stenosis due to Ruben MAC. The Ruben valve is  Ruben. Mild Ruben valve  [name]. Mild Ruben stenosis.  Ruben Ruben annular calcification.  5. Tricuspid valve [name] is moderate.  6. The aortic valve is tricuspid. There is Ruben calcifcation of the  aortic valve. There is Ruben thickening of the aortic valve. Aortic valve  [name] is not visualized. Ruben aortic  valve stenosis.  7. The inferior vena cava is normal in size with greater than 50%  respiratory variability, suggesting right atrial pressure of 3 mmHg.   FINDINGS  Left Ventricle: Left ventricular ejection fraction, by estimation, is 55  to 60%. The left ventricle has normal function. The left ventricle has no  regional wall motion abnormalities. The left ventricular internal cavity  size was normal in size. There is  moderate left ventricular hypertrophy. Left ventricular diastolic  parameters are indeterminate.   Right Ventricle: The right ventricular size is mildly enlarged. No  increase in right ventricular wall thickness. Right ventricular systolic  function is moderately reduced. There is mildly elevated pulmonary artery  systolic pressure. The tricuspid  regurgitant velocity is 3.08 m/s, Ruben with an assumed right atrial  pressure of 3 mmHg, the estimated right ventricular systolic pressure is  37.3 mmHg.   Left Atrium: Left atrial size was moderately dilated.   Right Atrium: Right atrial size was normal in size.   Pericardium: There is no evidence of pericardial effusion.   Ruben Valve: Mild functional Ruben stenosis due to Ruben MAC. The  Ruben valve is Ruben in appearance. There is moderate thickening  of the Ruben valve leaflet(s). There is moderate calcification of the  Ruben valve leaflet(s). Ruben Ruben  annular calcification. Mild Ruben valve [name]. Mild Ruben valve  stenosis. MV peak gradient, 14.2 mmHg. The mean Ruben valve gradient is  4.5 mmHg.   Tricuspid Valve: The tricuspid valve is normal in structure. Tricuspid   valve [name] is moderate . No evidence of tricuspid stenosis.   Aortic Valve: The aortic valve is tricuspid. There is Ruben calcifcation  of the aortic valve. There is Ruben thickening of the aortic valve. There  is Ruben aortic valve annular calcification. Aortic valve [name]  is not visualized. Ruben aortic  stenosis is present. Aortic valve mean gradient measures 37.0 mmHg.  Aortic valve peak gradient measures 63.4 mmHg. Aortic valve area, by VTI  measures 0.83 cm.   Pulmonic Valve: The pulmonic valve was normal in structure. Pulmonic valve  [name] is mild. No evidence of pulmonic stenosis.   Aorta: The aortic root is normal in size Ruben structure.   Venous: The inferior vena cava is normal in size with greater than 50%  respiratory variability, suggesting right atrial pressure of 3 mmHg.   IAS/Shunts: No atrial level shunt detected by color flow Doppler.     LEFT VENTRICLE  PLAX 2D  LVIDd:     5.10 cm  LVIDs:     3.10 cm  LV PW:     1.40 cm  LV IVS:    1.40 cm  LVOT diam:   2.40 cm  LV SV:     82  LV SV Index:  42  LVOT Area:   4.52 cm     RIGHT VENTRICLE      IVC  RV Basal diam: 3.50 cm  IVC diam: 1.60 cm  RV Mid diam:  3.00 cm  RV S prime:   8.59 cm/s  TAPSE (M-mode): 1.3 cm   LEFT ATRIUM       Index    RIGHT ATRIUM      Index  LA diam:    5.60 cm 2.90 cm/m  RA Area:   35.60 cm  LA Vol (A2C):  252.0 ml 130.56 ml/m RA Volume:  135.00 ml 69.94 ml/m  LA Vol (A4C):  185.0 ml 95.85 ml/m  LA Biplane Vol: 223.0 ml 115.53 ml/m  AORTIC VALVE  AV Area (Vmax):  0.87 cm  AV Area (Vmean):  0.84 cm  AV Area (VTI):   0.83 cm  AV Vmax:      398.25 cm/s  AV Vmean:     284.750 cm/s  AV VTI:      0.980 m  AV Peak Grad:   63.4 mmHg  AV Mean Grad:   37.0 mmHg  LVOT Vmax:     76.75 cm/s  LVOT Vmean:    52.700 cm/s  LVOT VTI:     0.180  m  LVOT/AV VTI ratio: 0.18    AORTA  Ao Root diam: 3.60 cm  Ao Asc diam: 3.50 cm   Ruben VALVE        TRICUSPID VALVE  MV Area (PHT): 1.67 cm   TR Peak grad:  37.9 mmHg  MV Peak grad: 14.2 mmHg  TR Vmax:    308.00 cm/s  MV Mean grad: 4.5 mmHg  MV Vmax:    1.88 m/s   SHUNTS  MV Vmean:   89.2 cm/s  Systemic VTI: 0.18 m  MV Decel Time: 454 msec   Systemic Diam: 2.40 cm  MV E velocity: 181.00 cm/s  MV A velocity: 58.20 cm/s  MV E/A ratio: 3.11   Jenkins Rouge MD  Electronically signed by Jenkins Rouge MD  Signature Date/Time: 07/16/2020/3:21:03 PM        RIGHT/LEFT HEART CATH Ruben CORONARY ANGIOGRAPHY    Conclusion    Prox RCA lesion is 40% stenosed.  RPDA lesion is 60% stenosed.  1st RPL lesion is 50% stenosed.  Mid LM to Dist LM lesion is 30% stenosed.  Prox LAD to Mid LAD lesion is 50% stenosed.  1st Diag lesion is 50% stenosed.  1. Diffuse calcific nonobstructive CAD 2. Ruben calcific aortic stenosis with mean gradient 37 mmHg, aortic valve area 1.05 cm 3. Mild pulmonary hypertension  Recommendation: Continue multidisciplinary team evaluation for treatment of Ruben symptomatic aortic stenosis   Surgeon Notes    09/07/2020 12:10 PM CV Procedure signed by Ruben Rile, MD    Indications  Ruben aortic stenosis [I35.0 (ICD-10-CM)]   Procedural Details  Technical Details INDICATION: Ruben, symptomatic aortic stenosis  PROCEDURAL DETAILS: There was an indwelling IV in a right antecubital vein. Using normal sterile technique, the IV was changed out for a 5 Fr brachial sheath over a 0.018 inch wire. The right wrist was then prepped, draped, Ruben anesthetized with 1% lidocaine. Ultrasound guidance is used for right radial artery access. Using the modified Seldinger technique a 5/6 French Slender sheath was placed in the right radial artery. Ultrasound images are digitally captured Ruben  stored in the patient's chart. Intra-arterial verapamil was administered through the radial artery sheath. IV heparin was administered after a JR4 catheter was advanced into the central aorta. A Swan-Ganz catheter was used for the right heart catheterization. Standard protocol was followed for recording of right heart pressures Ruben sampling of oxygen saturations. Fick cardiac output was calculated. Standard Judkins catheters were used for selective coronary angiography. LV pressure is recorded Ruben an aortic valve pullback is performed. An AL two catheter was used to direct a straight tip wire across the aortic valve Ruben pullback pressures were recorded. There were no immediate procedural complications. The patient was transferred to the post catheterization recovery area for further monitoring.    Estimated blood loss <50 mL.   During this procedure medications were administered to achieve Ruben maintain moderate conscious sedation while the patient's heart  rate, blood pressure, Ruben oxygen saturation were continuously monitored Ruben I was present face-to-face 100% of this time.   Medications (Filter: Administrations occurring from 1048 to 1154 on 08/24/20) (important) Continuous medications are totaled by the amount administered until 08/24/20 1154.    midazolam (VERSED) injection (mg) Total dose:  1 mg  Date/Time Rate/Dose/Volume Action   08/24/20 1107 1 mg Given    lidocaine (PF) (XYLOCAINE) 1 % injection (mL) Total volume:  4 mL  Date/Time Rate/Dose/Volume Action   08/24/20 1111 2 mL Given   1113 2 mL Given    Radial Cocktail/Verapamil only (mL) Total volume:  10 mL  Date/Time Rate/Dose/Volume Action   08/24/20 1117 10 mL Given    heparin sodium (porcine) injection (Units) Total dose:  5,000 Units  Date/Time Rate/Dose/Volume Action   08/24/20 1128 5,000 Units Given    Heparin (Porcine) in NaCl 1000-0.9 UT/500ML-% SOLN (mL) Total volume:  1,000  mL  Date/Time Rate/Dose/Volume Action   08/24/20 1146 500 mL Given   1146 500 mL Given    iohexol (OMNIPAQUE) 350 MG/ML injection (mL) Total volume:  55 mL  Date/Time Rate/Dose/Volume Action   08/24/20 1153 55 mL Given    Sedation Time  Sedation Time Physician-1: 40 minutes 44 seconds   Contrast  Medication Name Total Dose  iohexol (OMNIPAQUE) 350 MG/ML injection 55 mL    Radiation/Fluoro  Fluoro time: 7.6 (min) DAP: 21229 (mGycm2) Cumulative Air Kerma: 336 (mGy)   Coronary Findings   Diagnostic Dominance: Right  Left Main  Mid LM to Dist LM lesion is 30% stenosed. The lesion is calcified.  Left Anterior Descending  Prox LAD to Mid LAD lesion is 50% stenosed. The lesion is moderately calcified.  First Diagonal Branch  1st Diag lesion is 50% stenosed.  Left Circumflex  There is mild diffuse disease throughout the vessel.  Right Coronary Artery  Vessel is large.  Prox RCA lesion is 40% stenosed. The lesion is moderately calcified.  Right Posterior Descending Artery  RPDA lesion is 60% stenosed. The lesion is calcified.  First Right Posterolateral Branch  1st RPL lesion is 50% stenosed.   Intervention   No interventions have been documented.  Left Heart  Ruben Valve The annulus is calcified.  Aortic Valve There is Ruben aortic valve stenosis. The aortic valve is calcified. There is restricted aortic valve motion. Ruben aortic stenosis is present with peak to peak gradient 47 mmHg, mean gradient 37 mmHg, Peak instantaneous gradient 53 mmHg, calculated aortic valve area 1.05 cm   Coronary Diagrams   Diagnostic Dominance: Right    Intervention    Implants    No implant documentation for this case.    Syngo Images  Show images for CARDIAC CATHETERIZATION  Images on Long Term Storage  Show images for Osvaldo, Lamping to Procedure Log  Procedure Log     Hemo Data  Flowsheet Row Most  Recent Value  Fick Cardiac Output 5.19 L/min  Fick Cardiac Output Index 2.67 (L/min)/BSA  Aortic Mean Gradient 36.89 mmHg  Aortic Peak Gradient 47 mmHg  Aortic Valve Area 1.05  Aortic Value Area Index 0.54 cm2/BSA  RA A Wave 9 mmHg  RA V Wave 10 mmHg  RA Mean 8 mmHg  RV Systolic Pressure 48 mmHg  RV Diastolic Pressure 4 mmHg  RV EDP 8 mmHg  PA Systolic Pressure 54 mmHg  PA Diastolic Pressure 16 mmHg  PA Mean 32 mmHg  PW A Wave 23 mmHg  PW V Wave 29  mmHg  PW Mean 22 mmHg  AO Systolic Pressure 97 mmHg  AO Diastolic Pressure 64 mmHg  AO Mean 78 mmHg  LV Systolic Pressure 945 mmHg  LV Diastolic Pressure 7 mmHg  LV EDP 16 mmHg  AOp Systolic Pressure 038 mmHg  AOp Diastolic Pressure 59 mmHg  AOp Mean Pressure 77 mmHg  LVp Systolic Pressure 882 mmHg  LVp Diastolic Pressure 4 mmHg  LVp EDP Pressure 13 mmHg  QP/QS 1  TPVR Index 12 HRUI  TSVR Index 29.24 HRUI  PVR SVR Ratio 0.14  TPVR/TSVR Ratio 0.41        TRANSESOPHOGEAL ECHO REPORT       Patient Name:  MAIKOL GRASSIA Date of Exam: 09/07/2020  Medical Rec #: [id number] Height:    66.0 in  Accession #:  [id number] Weight:    186.5 lb  Date of Birth: 14-Nov-1926  BSA:     1.941 m  Patient Age:  13 years  BP:      132/79 mmHg  Patient Gender: M     HR:      82 bpm.  Exam Location: Outpatient   Procedure: 3D Echo, Transesophageal Echo, Cardiac Doppler, Color Doppler  Ruben       Saline Contrast Bubble Study   Indications:   I35.0 Nonrheumatic aortic (valve) stenosis    History:     Patient has prior history of Echocardiogram examinations,  most          recent 07/16/2020. [name], Ruben, Aortic Valve Disease;          Arrythmias:Atrial Fibrillation Ruben Ruben [name].    Sonographer:   Tiffany Dance  Referring Phys: [id number] Eileen Stanford  Diagnosing Phys: Eleonore Chiquito MD   PROCEDURE: After discussion of the risks Ruben benefits of a TEE, an  informed  consent was obtained from the patient. TEE procedure time was 40  minutes. The transesophogeal probe was passed without difficulty through  the esophogus of the patient. Local  oropharyngeal anesthetic was provided with Cetacaine. Sedation performed  by different physician. The patient was monitored while under deep  sedation. Anesthestetic sedation was provided intravenously by  Anesthesiology: 374m of Propofol, 326mof  Lidocaine. Image quality was excellent. The patient's vital signs;  including heart rate, blood pressure, Ruben oxygen saturation; remained  stable throughout the procedure. The patient developed no complications  during the procedure.   IMPRESSIONS    1. The MV is Ruben with a small flail P2 segment. 2D VC 0.34 cm,  2D ERO 0.27 cm2 (79 degrees), R vol 28 cc. 3D VCA 0.13 cm2. The flail gap  is 0.21 cm Ruben flail width 0.52 cm. The jet is eccentric Ruben there is  systolic pulmonary flow reversal in  the left upper pulmonary vein, but due to the small flail segment, this is  not likely hemodynamically significant. There is mild Ruben stenosis with  3D MVA 2.3 cm2. MG 3.8 mmHG @ 78 bpm. The Ruben valve is Ruben.  Mild to moderate Ruben valve  [name]. Mild Ruben stenosis. The mean Ruben valve gradient is 3.8  mmHg with average heart rate of 75 bpm. Ruben Ruben annular  calcification.  2. The bubble study was positive after 6 cardiac cycles, indicating there  is an intrapulmonary shunt. There is no interatrial shunt. Agitated saline  contrast bubble study was positive with shunting observed after >6 cardiac  cycles suggestive of  intrapulmonary shunting.  3. The aortic valve is tricuspid. There is Ruben calcifcation of the  aortic valve. There is Ruben thickening of the aortic valve. Aortic valve  [name] is mild. Ruben aortic valve stenosis. Aortic valve area, by  VTI measures 0.52 cm. Aortic  valve Vmax measures 3.94 m/s.  4.  Left ventricular ejection fraction, by estimation, is 50 to 55%. The  left ventricle has low normal function. The left ventricle has no regional  wall motion abnormalities.  5. Right ventricular systolic function is mildly reduced. The right  ventricular size is moderately enlarged.  6. Left atrial size was severely dilated. No left atrial/left atrial  appendage thrombus was detected. The LAA emptying velocity was 32 cm/s.  7. Right atrial size was severely dilated.  8. The tricuspid valve is abnormal. Tricuspid valve [name] is  moderate to Ruben.  9. Pulmonic valve [name] is moderate.  10. Severely dilated pulmonary artery.   FINDINGS  Left Ventricle: Left ventricular ejection fraction, by estimation, is 50  to 55%. The left ventricle has low normal function. The left ventricle has  no regional wall motion abnormalities. The left ventricular internal  cavity size was normal in size.   Right Ventricle: The right ventricular size is moderately enlarged. No  increase in right ventricular wall thickness. Right ventricular systolic  function is mildly reduced.   Left Atrium: Left atrial size was severely dilated. No left atrial/left  atrial appendage thrombus was detected. The LAA emptying velocity was 32  cm/s.   Right Atrium: Right atrial size was severely dilated.   Pericardium: There is no evidence of pericardial effusion.   Ruben Valve: The MV is Ruben with a small flail P2 segment. 2D VC  0.34 cm, 2D ERO 0.27 cm2 (79 degrees), R vol 28 cc. 3D VCA 0.13 cm2. The  flail gap is 0.21 cm Ruben flail width 0.52 cm. The jet is eccentric Ruben  there is systolic pulmonary flow  reversal in the left upper pulmonary vein, but due to the small flail  segment, this is not likely hemodynamically significant. There is mild  Ruben stenosis with 3D MVA 2.3 cm2. MG 3.8 mmHG @ 78 bpm. The Ruben  valve is Ruben in appearance. Ruben  Ruben annular  calcification. Mild to moderate Ruben valve  [name]. Mild Ruben valve stenosis. The mean Ruben valve gradient  is 3.8 mmHg with average heart rate of 75 bpm.   Tricuspid Valve: The tricuspid valve is abnormal. Tricuspid valve  [name] is moderate to Ruben. No evidence of tricuspid stenosis.   Aortic Valve: The aortic valve is tricuspid. There is Ruben calcifcation  of the aortic valve. There is Ruben thickening of the aortic valve.  Aortic valve [name] is mild. Ruben aortic stenosis is present.  Aortic valve mean gradient measures 38.1  mmHg. Aortic valve peak gradient measures 62.1 mmHg. Aortic valve area,  by VTI measures 0.52 cm.   Pulmonic Valve: The pulmonic valve was grossly normal. Pulmonic valve  [name] is moderate. No evidence of pulmonic stenosis.   Aorta: The aortic root Ruben ascending aorta are structurally normal, with  no evidence of dilitation.   Pulmonary Artery: The pulmonary artery is severely dilated.   Venous: A pattern of systolic flow reversal, suggestive of Ruben Ruben  [name] is recorded from the left upper pulmonary vein.   IAS/Shunts: The atrial septum is grossly normal. Agitated saline contrast  was given intravenously to evaluate for intracardiac shunting. Agitated  saline contrast bubble study was positive with shunting observed after >6  cardiac cycles suggestive of  intrapulmonary shunting. The bubble study  was positive after 6 cardiac  cycles, indicating there is an intrapulmonary shunt. There is no  interatrial shunt.     LEFT VENTRICLE  PLAX 2D  LVOT diam:   2.20 cm  LV SV:     53  LV SV Index:  27  LVOT Area:   3.80 cm     AORTIC VALVE  AV Area (Vmax):  0.61 cm  AV Area (Vmean):  0.59 cm  AV Area (VTI):   0.52 cm  AV Vmax:      394.08 cm/s  AV Vmean:     294.660 cm/s  AV VTI:      1.032 m  AV Peak Grad:   62.1 mmHg  AV Mean Grad:   38.1 mmHg  LVOT  Vmax:     62.82 cm/s  LVOT Vmean:    46.070 cm/s  LVOT VTI:     0.140 m  LVOT/AV VTI ratio: 0.14    AORTA  Ao Root diam: 2.60 cm  Ao Asc diam: 3.06 cm   Ruben VALVE         TRICUSPID VALVE  MV Area VTI: 1.45 cm    TR Peak grad:  44.9 mmHg  MV Mean grad: 3.8 mmHg    TR Vmax:    335.00 cm/s  MV VTI:    0.37 m  MR Peak grad:  46.2 mmHg  SHUNTS  MR Vmax:     339.91 cm/s Systemic VTI: 0.14 m  MR PISA:     9.05 cm  Systemic Diam: 2.20 cm  MR PISA Eff ROA: 103 mm  MR PISA Radius: 1.20 cm   Eleonore Chiquito MD  Electronically signed by Eleonore Chiquito MD  Signature Date/Time: 09/07/2020/9:20:[name] PM     Cardiac [name] CT  TECHNIQUE: The patient was scanned on a [employer]. A 100 kV retrospective scan was triggered in the descending thoracic aorta at 111 HU's. Gantry rotation speed was 250 msecs Ruben collimation was .6 mm. No beta blockade or nitro were given. The 3D data set was reconstructed in 5% intervals of the R-R cycle. Systolic Ruben diastolic phases were analyzed on a dedicated work station using MPR, MIP Ruben VRT modes. The patient received 80 cc of contrast.  FINDINGS: Image quality: Average. The entire left ventricle was not imaged.  Noise artifact is: Moderate cardiac motion artifact. Millisecond reconstruction performed.  Valve Morphology: The aortic valve is tricuspid with diffuse Ruben calcifications. The leaflets are Ruben restricted in systole consistent with Ruben aortic stenosis.  Aortic Valve Calcium score: 4577  Aortic annular dimension:  Phase assessed: 200 ms  Annular area: 448 mm2  Annular perimeter: 75.9 mm  Max diameter: 26.8 mm  Min diameter: 21.9 mm  Annular Ruben subannular calcification: No annular calcification. There is minimal subannular calcification under the [place].  Optimal coplanar projection: LAO 27 CAU 4  Coronary Artery Height above Annulus:  Left  Main: 12.6 mm  Right Coronary: 17.0 mm  Sinus of Valsalva Measurements:  Non-coronary: 30 mm  Right-coronary: 31 mm  Left-coronary: 31 mm  Sinus of Valsalva Height:  Non-coronary: 23.0 mm  Right-coronary: 21.3 mm  Left-coronary: 19.8 mm  Sinotubular Junction: 28 mm  Ascending Thoracic Aorta: 34 mm  Coronary Arteries: Normal coronary origin. Right dominance. The study was performed without use of NTG Ruben is insufficient for plaque evaluation. Please refer to recent cardiac catheterization for coronary assessment. 3-vessel calcifications noted.  Cardiac Morphology:  Right Atrium: Right atrial size is dilated.  Right Ventricle:  The right ventricular cavity is dilated.  Left Atrium: Left atrial size is dilated in size. There is contrast mixing artifact in the LAA.  Left Ventricle: The ventricular cavity size is within normal limits Ruben there are no stigmata of prior infarction in the visualized portion.  Pulmonary arteries: Dilated in size without proximal filling defect. Findings suggestive of pulmonary hypertension.  Pulmonary veins: Normal pulmonary venous drainage.  Pericardium: Normal thickness with no significant effusion or calcium present.  Ruben Valve: The Ruben valve is Ruben with Ruben Ruben annular calcification.  Extra-cardiac findings: See attached radiology report for non-cardiac structures.  IMPRESSION: 1. Trileaflet aortic valve with Ruben aortic stenosis (calcium score 4577).  2. Cardiac motion artifact present Ruben annular measurements performed using 200 ms reconstruction.  3. Annular measurements favorable for 26 mm Edwards Sapien 3 [name] (448 mm2).  4. No significant annular calcification. Minimal subannular calcification under the [place].  5. Sufficient coronary to annulus distance.  6. Optimal Fluoroscopic Angle for Delivery: LAO 27 CAU 4  7. Dilated pulmonary artery suggestive of  pulmonary hypertension.  8. Contrast mixing artifact is present in the LAA.  9. Ruben Ruben annular calcification.  [place] T. Ruben Box, MD   Electronically Signed By: Eleonore Chiquito On: 08/30/2020 18:12    CT ANGIOGRAPHY CHEST, ABDOMEN Ruben PELVIS  TECHNIQUE: Non-contrast CT of the chest was initially obtained.  Multidetector CT imaging through the chest, abdomen Ruben pelvis was performed using the standard protocol during bolus administration of intravenous contrast. Multiplanar reconstructed images Ruben MIPs were obtained Ruben reviewed to evaluate the vascular anatomy.  CONTRAST: 43m OMNIPAQUE IOHEXOL 350 MG/ML SOLN  COMPARISON: No priors.  FINDINGS: CTA CHEST FINDINGS  Cardiovascular: Heart size is enlarged with biatrial dilatation. There is no significant pericardial fluid, thickening or pericardial calcification. There is aortic atherosclerosis, as well as atherosclerosis of the great vessels of the mediastinum Ruben the coronary arteries, including calcified atherosclerotic plaque in the left main, left anterior descending, left circumflex Ruben right coronary arteries. Ruben thickening calcification of the aortic valve. Ruben calcifications of the Ruben annulus. Dilatation of the pulmonic trunk (4.1 cm in diameter).  Mediastinum/Lymph Nodes: No pathologically enlarged mediastinal or hilar lymph nodes. Multiple densely calcified right hilar Ruben mediastinal lymph nodes are incidentally noted. Esophagus is unremarkable in appearance. No axillary lymphadenopathy.  Lungs/Pleura: Moderate bilateral pleural effusions lying dependently with areas of passive subsegmental atelectasis in the lower lobes of the lungs bilaterally. In the right lower lobe (axial image 90 of series 6 Ruben coronal image 101 of series 8) there is an elongated nodular density measuring 1.4 x 2.1 x 2.8 cm which appears associated with a pulmonary artery branch to the right  lower lobe, Ruben demonstrates enhancement characteristics similar to adjacent vasculature, suspicious for a pulmonary arteriovenous malformation. No other definite suspicious appearing pulmonary nodules or masses are noted. No acute consolidative airspace disease. Mild diffuse bronchial wall thickening with mild centrilobular Ruben paraseptal emphysema.  Musculoskeletal/Soft Tissues: There are no aggressive appearing lytic or blastic lesions noted in the visualized portions of the skeleton.  CTA ABDOMEN Ruben PELVIS FINDINGS  Hepatobiliary: No suspicious cystic or solid hepatic lesions. No intra or extrahepatic biliary ductal dilatation. Calcified gallstone measuring 1.1 cm in diameter in the dependent portion of the gallbladder. No findings to suggest an acute cholecystitis at this time.  Pancreas: No pancreatic mass. No pancreatic ductal dilatation. No pancreatic or peripancreatic fluid collections or inflammatory changes.  Spleen: Unremarkable.  Adrenals/Urinary Tract: Low-attenuation lesions in the left kidney, largest  of which is in the interpolar region where there is an exophytic 5.2 cm lesion, compatible with simple cysts. Other subcentimeter low-attenuation lesions in the right kidney are too small to definitively characterize, but statistically likely to represent tiny cysts. No aggressive appearing renal lesions. No hydroureteronephrosis. Urinary bladder is nearly decompressed, but otherwise unremarkable in appearance.  Stomach/Bowel: The appearance of the stomach is normal. No pathologic dilatation of small bowel or colon. Short segment of distal small bowel extends into a small right inguinal hernia. The appendix is not confidently identified Ruben may be surgically absent. Regardless, there are no inflammatory changes noted adjacent to the cecum to suggest the presence of an acute appendicitis at this time.  Vascular/Lymphatic: Aortic atherosclerosis, with  vascular findings Ruben measurements pertinent to potential [name] procedure, as detailed below. No aneurysm or dissection noted in the abdominal or pelvic vasculature. No lymphadenopathy noted in the abdomen or pelvis.  Reproductive: Prostate gland is enlarged measuring up to 5.2 x 4.1 x 5.9 cm. Seminal vesicles are unremarkable in appearance.  Other: Small right inguinal hernia containing a short loop of distal small bowel (likely ileum). No significant volume of ascites. No pneumoperitoneum.  Musculoskeletal: There are no aggressive appearing lytic or blastic lesions noted in the visualized portions of the skeleton.  VASCULAR MEASUREMENTS PERTINENT TO [name]:  AORTA:  Minimal Aortic Diameter-13 x 12 mm  Severity of Aortic Calcification-Ruben  RIGHT PELVIS:  Right Common Iliac Artery -  Minimal Diameter-8.6 x 9.7 mm  Tortuosity-mild  Calcification-moderate  Right External Iliac Artery -  Minimal Diameter-7.0 x 7.1 mm  Tortuosity-Ruben  Calcification-mild  Right Common Femoral Artery -  Minimal Diameter-8.4 x 4.3 mm  Tortuosity-mild  Calcification-moderate to Ruben  LEFT PELVIS:  Left Common Iliac Artery -  Minimal Diameter-9.5 x 9.2 mm  Tortuosity-mild  Calcification-moderate  Left External Iliac Artery -  Minimal Diameter-7.4 x 6.7 mm  Tortuosity-Ruben  Calcification-none  Left Common Femoral Artery -  Minimal Diameter-8.6 x 7.1 mm  Tortuosity-mild  Calcification-Ruben  Review of the MIP images confirms the above findings.  IMPRESSION: 1. Vascular findings Ruben measurements pertinent to potential [name] procedure, as detailed above. 2. Ruben thickening calcification of the aortic valve, compatible with reported clinical history of Ruben aortic stenosis. 3. Ruben dilatation of the pulmonic trunk (4.1 cm in diameter), concerning for pulmonary arterial hypertension. Notably, there is also an  elongated nodular density in the right lower lobe which has enhancement characteristics suggestive of a vascular lesions such as a pulmonary arteriovenous malformation. This could be confirmed with PE protocol CT scan if of clinical concern. 4. Ruben calcifications of the Ruben annulus. 5. Cardiomegaly with biatrial dilatation. 6. Moderate bilateral pleural effusions with areas of passive subsegmental atelectasis in the dependent portions of the lower lobes of the lungs bilaterally. 7. Small right inguinal hernia containing a short segment of distal small bowel, without evidence of bowel incarceration or obstruction at this time. 8. Cholelithiasis without evidence of acute cholecystitis. 9. Prostatomegaly. 10. Additional incidental findings, as above.     EKG:  EKG is NOT ordered today.   Recent Labs: 07/15/2020: TSH 0.398 10/12/2020: NT-Pro BNP 7,048 10/13/2020: ALT 16; B Natriuretic Peptide 717.2 10/14/2020: Hemoglobin 10.7; Platelets 321 10/16/2020: Magnesium 1.8 10/20/2020: BUN 20; Creatinine, Ser 0.78; Potassium 4.5; Sodium 137  Recent Lipid Panel No results found for: CHOL, TRIG, HDL, CHOLHDL, VLDL, LDLCALC, LDLDIRECT   Risk Assessment/Calculations:    CHA2DS2-VASc Score = 5  This indicates a 7.2% annual risk of stroke. The  patient's score is based upon: [name] History: Yes Ruben [name] History: Yes Diabetes History: Yes Stroke History: No Vascular Disease History: No Age Score: 2 Gender Score: 0   Physical Exam:    VS:  BP (!) 104/54   Pulse (!) 40   Ht _0  (1.676 m)   Wt 202 lb (91.6 kg)   SpO2 93%   BMI 32.60 kg/m     Wt Readings from Last 3 Encounters:  10/20/20 202 lb (91.6 kg)  10/17/20 201 lb 12.8 oz (91.5 kg)  10/12/20 212 lb 6.4 oz (96.3 kg)     GEN: Well nourished, well developed in no acute distress, in wheelchair HEENT: Normal NECK: no JVD LYMPHATICS: No lymphadenopathy CARDIAC: irreg irrreg, 3/6 SEM @ RUSB Ruben 2/6 holosystolic murmur. No rubs,  gallops. 1+ pitting edema, L>R RESPIRATORY:  Clear lung sounds.  ABDOMEN: Soft, non-tender, non-distended MUSCULOSKELETAL:  SKIN: Warm Ruben dry NEUROLOGIC:  Alert Ruben oriented x 3 PSYCHIATRIC:  Normal affect   ASSESSMENT:    1. Acute on chronic diastolic congestive heart failure, unspecified HF chronicity ([place])   2. Ruben aortic stenosis   3. Chronic atrial fibrillation ([place])   4. Type III open fracture of left hip with routine healing, subsequent encounter    PLAN:    In order of problems listed above:  Acute on chronic diastolic [name]: volume status much improved from last week after 4 day admission for IV lasix. Improving on Torsemide 60m daily. Will increase Torsemide to 675mx 2 days. Will follow him closely Ruben see him back in office next week. Baseline weight ~190. Check BMET today.   Ruben AS: has known Ruben AS Ruben planning for [name]. Pt is anxious to get valve done. Now that his heart failure is better controlled Ruben he is making more progress from a functional status, we may be able to consider re booking case. Will discuss with multidisciplinary valve team next Tuesday about timing of surgery.   Chronic afib: rate well controlled. No changes made. Continue on chronic coumadin.     Hip fracture s/p ORIF: making significant progress with hip. Less pain Ruben able to ambulate more with the aid of a walker.    Medication Adjustments/Labs Ruben Tests Ordered: Current medicines are reviewed at length with the patient today.  Concerns regarding medicines are outlined above.  Orders Placed This Encounter  Procedures  . Basic metabolic panel   No orders of the defined types were placed in this encounter.   Patient Instructions  Medication Instructions:  1) INCREASE TORSEMIDE to 60 mg for 2 days then resume 40 mg daily *If you need a refill on your cardiac medications before your next appointment, please call your pharmacy*  Lab Work: TODAY: BMET If you have labs (blood  work) drawn today Ruben your tests are completely normal, you will receive your results only by: . Marland KitchenyChart Message (if you have MyChart) OR . A paper copy in the mail If you have any lab test that is abnormal or we need to change your treatment, we will call you to review the results.  Follow-Up: You are scheduled with KaNell RangePA next week, 10/26/20 at 3:00PM.    Signed, KaAngelena FormPA-C  10/21/2020 11:19 AM    [place]

## 2020-10-20 NOTE — Patient Instructions (Addendum)
Medication Instructions:  1) INCREASE TORSEMIDE to 60 mg for 2 days then resume 40 mg daily *If you need a refill on your cardiac medications before your next appointment, please call your pharmacy*  Lab Work: TODAY: BMET If you have labs (blood work) drawn today and your tests are completely normal, you will receive your results only by: Marland Kitchen MyChart Message (if you have MyChart) OR . A paper copy in the mail If you have any lab test that is abnormal or we need to change your treatment, we will call you to review the results.  Follow-Up: You are scheduled with Carlean Jews, PA next week, 10/26/20 at 3:00PM.

## 2020-10-21 LAB — BASIC METABOLIC PANEL
BUN/Creatinine Ratio: 26 — ABNORMAL HIGH (ref 10–24)
BUN: 20 mg/dL (ref 10–36)
CO2: 32 mmol/L — ABNORMAL HIGH (ref 20–29)
Calcium: 8.4 mg/dL — ABNORMAL LOW (ref 8.6–10.2)
Chloride: 91 mmol/L — ABNORMAL LOW (ref 96–106)
Creatinine, Ser: 0.78 mg/dL (ref 0.76–1.27)
Glucose: 97 mg/dL (ref 65–99)
Potassium: 4.5 mmol/L (ref 3.5–5.2)
Sodium: 137 mmol/L (ref 134–144)
eGFR: 84 mL/min/{1.73_m2} (ref >59)

## 2020-10-26 ENCOUNTER — Ambulatory Visit (INDEPENDENT_AMBULATORY_CARE_PROVIDER_SITE_OTHER): Payer: Medicare Other | Admitting: Physician Assistant

## 2020-10-26 ENCOUNTER — Encounter: Payer: Self-pay | Admitting: Physician Assistant

## 2020-10-26 ENCOUNTER — Other Ambulatory Visit: Payer: Self-pay

## 2020-10-26 VITALS — BP 110/70 | HR 95 | Ht 67.0 in | Wt 199.0 lb

## 2020-10-26 DIAGNOSIS — I5033 Acute on chronic diastolic (congestive) heart failure: Secondary | ICD-10-CM | POA: Diagnosis not present

## 2020-10-26 DIAGNOSIS — I350 Nonrheumatic aortic (valve) stenosis: Secondary | ICD-10-CM

## 2020-10-26 DIAGNOSIS — I482 Chronic atrial fibrillation, unspecified: Secondary | ICD-10-CM

## 2020-10-26 DIAGNOSIS — S72002F Fracture of unspecified part of neck of left femur, subsequent encounter for open fracture type IIIA, IIIB, or IIIC with routine healing: Secondary | ICD-10-CM | POA: Diagnosis not present

## 2020-10-26 NOTE — Progress Notes (Signed)
[facility] [place]                                     Cardiology Office Note:    Date:  10/26/2020   ID:  Ruben [name], DOB 01/24/1927, MRN [id number]  PCP:  Alroy Dust, L.Marlou Sa, MD  [facility] Cardiologist:  Dr. Shiela [facility] Electrophysiologist:  None   Referring MD: Alroy Dust, Carlean Jews.Marlou Sa, MD   Follow-up to discuss heart failure and TAVR  History of Present Illness:    Ruben [name] is a 87 y.o. male with a hx of chronic diastolic CHF, RBBB, mitral regurgitation, HTN, COPDon home 02, longstanding persistent atrial fibrillation on warfarin, degenerative arthritis, severe aortic stenosisbeing worked up for TAVR, and recent mechanical fall with hip fracture s/p ORIF and admission for acute on chronic diastolic CHF who presents to clinic for follow up.   Patient has remained functionally independent and remarkably active for a gentleman his age. He has history of longstanding persistent atrial fibrillation of uncertain duration. He has been anticoagulated using warfarin for many years without any history of stroke or bleeding complication. For the last several years he has been told that he has a heart murmur and aortic stenosis. Up until recently he has been followed by cardiologist in [place].   The patient relocated to [place] to live near his son. In 07/2020 he was hospitalized with acute hypoxic respiratory failure felt related to a combination of COPD with acute exacerbation, rhinovirus infection, and acute on chronic diastolic congestive heart failure. Symptoms improved with antibiotics, diuresis, and nebulized bronchodilators. Transthoracic echocardiogram performed at that time revealed normal left ventricular systolic function with severe aortic stenosis and what was initially felt to be mild mitral regurgitation. He was ultimately discharged home on home oxygen therapy. He was referred to the multidisciplinary heart  valve clinic and was evaluated by Dr. Burt Knack. Diagnostic cardiac catheterization 08/24/20 revealed diffuse calcific nonobstructive coronary artery disease with severe aortic stenosis and mild pulmonary hypertension. TEE confirmed presence of findings consistent with severe aortic stenosis. TEE also revealed severe mitral regurgitation with a small flail segment of the middle scallop of the posterior leaflet and an eccentric jet of mitral regurgitation with flow reversal in the pulmonary veins. There was severe mitral annular calcification and functional anatomy of the valve did not appear amenable to transcatheter edge-to-edge repair.   The patient has been evaluated by the multidisciplinary valve team and felt to have severe, symptomatic aortic stenosis and to be a suitable candidate for TAVR, which was set up for 09/20/20. On his way to the hospital for his valve surgery on 2/15, the patient suffered a mechanical fall while trying to get into the car that resulted in a hip fracture. He underwent successful ORIF by Dr. Percell Miller on 09/21/20. He was discharged to [facility] for short term rehabilitation. I saw him in the office on 10/12/20 for follow up. He was noted to be in acute heart failure with 20 lb weight gain and making slow progress with hip rehabilitation. Outpatient diuresis was attempted but he was ultimately admitted from 3/10-3/14/22 for IV diuresis, discharge weight 201.7 lbs. I saw him in the office on 3/17 after discharge and his volume status improved on Torsemide 2m daily. I increased his Torsemide to 615mx 2 days to improve his volume status and his electrolytes were  stable.   Today he presents to clinic for follow up. He is accompanied by his son. He reports feeling better. He reports he is able to walk 20 feet with the walker. He is anxious to have his valve replaced as he thinks he would be able progress further with his rehabilitation.He denies worsening shortness of breath  or fatigue. He denies dizziness, palpitations, or syncope. He denies any blood in urine or stool. Reports his swelling in his legs has improved. Left leg has chronic swelling since hip fracture. He has a small wound on his foot that is wrapped and does not appear infected.   Past Medical History:  Diagnosis Date  . Arthritis   . CHF (congestive heart failure) ([place])   . COPD (chronic obstructive pulmonary disease) ([place])   . Mitral regurgitation   . Persistent atrial fibrillation (H[facility])    on coumadin   . RBBB   . Severe aortic stenosis     Past Surgical History:  Procedure Laterality Date  . APPENDECTOMY    . BUBBLE STUDY  09/07/2020   Procedure: BUBBLE STUDY;  Surgeon: Geralynn Rile, MD;  Location: [facility];  Service: Cardiovascular;;  . CARDIAC CATHETERIZATION    . CLAVICLE SURGERY    . INTRAMEDULLARY (IM) NAIL INTERTROCHANTERIC Left 09/20/2020   Procedure: INTRAMEDULLARY (IM) NAIL INTERTROCHANTRIC;  Surgeon: Renette Butters, MD;  Location: [facility];  Service: Orthopedics;  Laterality: Left;  . RIGHT/LEFT HEART CATH AND CORONARY ANGIOGRAPHY N/A 08/24/2020   Procedure: RIGHT/LEFT HEART CATH AND CORONARY ANGIOGRAPHY;  Surgeon: Sherren Mocha, MD;  Location: [facility] CV [facility];  Service: Cardiovascular;  Laterality: N/A;  . TEE WITHOUT CARDIOVERSION N/A 09/07/2020   Procedure: TRANSESOPHAGEAL ECHOCARDIOGRAM (TEE);  Surgeon: Geralynn Rile, MD;  Location: [facility];  Service: Cardiovascular;  Laterality: N/A;  . TOTAL KNEE ARTHROPLASTY Bilateral     Current Medications: Current Meds  Medication Sig  . diclofenac Sodium (VOLTAREN) 1 % GEL Apply 1 application topically 3 (three) times daily as needed (for pain).  Marland Kitchen dutasteride (AVODART) 0.5 MG capsule Take 0.5 mg by mouth in the morning and at bedtime.  . Ferrous Gluconate 324 (37.5 Fe) MG TABS Take 1 tablet by mouth every other day.  . levalbuterol (XOPENEX HFA) 45 MCG/ACT inhaler Inhale 1 puff into the lungs every 4  (four) hours as needed for wheezing or shortness of breath.  . levalbuterol (XOPENEX) 0.63 MG/3ML nebulizer solution Take 0.63 mg by nebulization 2 (two) times daily.  . metoprolol succinate (TOPROL-XL) 50 MG 24 hr tablet Take 50 mg by mouth daily.  . montelukast (SINGULAIR) 10 MG tablet Take 10 mg by mouth at bedtime.  . polyethylene glycol (MIRALAX / GLYCOLAX) 17 g packet Take 17 g by mouth daily as needed for mild constipation.  . polyvinyl alcohol (LIQUIFILM TEARS) 1.4 % ophthalmic solution Place 1 drop into both eyes 2 (two) times daily as needed for dry eyes.  . potassium chloride SA (KLOR-CON) 20 MEQ tablet Take 1 tablet (20 mEq total) by mouth daily.  Marland Kitchen senna-docusate (SENNA-PLUS) 8.6-50 MG tablet Take 1 tablet by mouth daily.  Marland Kitchen SPIRIVA RESPIMAT 2.5 MCG/ACT AERS Inhale 2 puffs into the lungs as needed.  . tamsulosin (FLOMAX) 0.4 MG CAPS capsule Take 0.8 mg by mouth at bedtime.  . torsemide (DEMADEX) 20 MG tablet Take 40 mg by mouth at bedtime.  . vitamin B-12 (CYANOCOBALAMIN) 500 MCG tablet Take 500 mcg by mouth daily.  Marland Kitchen warfarin (COUMADIN) 1 MG tablet Take 1  mg by mouth daily.  Marland Kitchen warfarin (COUMADIN) 2.5 MG tablet Take 2.5 mg by mouth daily.     Allergies:   Patient has no known allergies.   Social History   Socioeconomic History  . Marital status: Married    Spouse name: Not on file  . Number of children: Not on file  . Years of education: Not on file  . Highest education level: Not on file  Occupational History  . Occupation: retired  Tobacco Use  . Smoking status: Former Smoker    Packs/day: 1.00    Years: 30.00    Pack years: 30.00    Quit date: 1975    Years since quitting: 47.2  . Smokeless tobacco: Never Used  Vaping Use  . Vaping Use: Never used  Substance and Sexual Activity  . Alcohol use: Not Currently  . Drug use: Never  . Sexual activity: Not on file  Other Topics Concern  . Not on file  Social History Narrative  . Not on file   Social  Determinants of Health   Financial Resource Strain: Not on file  Food Insecurity: No Food Insecurity  . Worried About Charity fundraiser in the Last Year: Never true  . Ran Out of Food in the Last Year: Never true  Transportation Needs: No Transportation Needs  . Lack of Transportation (Medical): No  . Lack of Transportation (Non-Medical): No  Physical Activity: Not on file  Stress: Not on file  Social Connections: Not on file     Family History: The patient's family history is not on file.  ROS:   Please see the history of present illness.    All other systems reviewed and are negative.  EKGs/Labs/Other Studies Reviewed:    The following studies were reviewed today: The following studies were reviewed today:  ECHOCARDIOGRAM REPORT    Patient Name:  MESHILEM MACHUCA Date of Exam: 07/16/2020  Medical Rec #: [id number] Height:    67.0 in  Accession #:  [id number] Weight:    179.2 lb  Date of Birth: Aug 14, 1926  BSA:     1.930 m  Patient Age:  31 years  BP:      94/56 mmHg  Patient Gender: M     HR:      71 bpm.  Exam Location: Inpatient   Procedure: 2D Echo, Cardiac Doppler and Color Doppler   Indications:  I50.33 Acute on chronic diastolic (congestive) heart  failure    History:    Patient has no prior history of Echocardiogram  examinations.         COPD; Arrythmias:Atrial Fibrillation.    Sonographer:  Jonelle Sidle Dance  Referring Phys: [place]    1. Left ventricular ejection fraction, by estimation, is 55 to 60%. The  left ventricle has normal function. The left ventricle has no regional  wall motion abnormalities. There is moderate left ventricular hypertrophy.  Left ventricular diastolic  parameters are indeterminate.  2. Right ventricular systolic function is moderately reduced. The right  ventricular size is mildly enlarged. There is mildly elevated pulmonary  artery systolic  pressure.  3. Left atrial size was moderately dilated.  4. Mild functional mitral stenosis due to severe MAC. The mitral valve is  degenerative. Mild mitral valve regurgitation. Mild mitral stenosis.  Severe mitral annular calcification.  5. Tricuspid valve regurgitation is moderate.  6. The aortic valve is tricuspid. There is severe calcifcation of the  aortic valve. There is severe thickening of  the aortic valve. Aortic valve  regurgitation is not visualized. Severe aortic valve stenosis.  7. The inferior vena cava is normal in size with greater than 50%  respiratory variability, suggesting right atrial pressure of 3 mmHg.   FINDINGS  Left Ventricle: Left ventricular ejection fraction, by estimation, is 55  to 60%. The left ventricle has normal function. The left ventricle has no  regional wall motion abnormalities. The left ventricular internal cavity  size was normal in size. There is  moderate left ventricular hypertrophy. Left ventricular diastolic  parameters are indeterminate.   Right Ventricle: The right ventricular size is mildly enlarged. No  increase in right ventricular wall thickness. Right ventricular systolic  function is moderately reduced. There is mildly elevated pulmonary artery  systolic pressure. The tricuspid  regurgitant velocity is 3.08 m/s, and with an assumed right atrial  pressure of 3 mmHg, the estimated right ventricular systolic pressure is  34.1 mmHg.   Left Atrium: Left atrial size was moderately dilated.   Right Atrium: Right atrial size was normal in size.   Pericardium: There is no evidence of pericardial effusion.   Mitral Valve: Mild functional mitral stenosis due to severe MAC. The  mitral valve is degenerative in appearance. There is moderate thickening  of the mitral valve leaflet(s). There is moderate calcification of the  mitral valve leaflet(s). Severe mitral  annular calcification. Mild mitral valve regurgitation. Mild mitral  valve  stenosis. MV peak gradient, 14.2 mmHg. The mean mitral valve gradient is  4.5 mmHg.   Tricuspid Valve: The tricuspid valve is normal in structure. Tricuspid  valve regurgitation is moderate . No evidence of tricuspid stenosis.   Aortic Valve: The aortic valve is tricuspid. There is severe calcifcation  of the aortic valve. There is severe thickening of the aortic valve. There  is severe aortic valve annular calcification. Aortic valve regurgitation  is not visualized. Severe aortic  stenosis is present. Aortic valve mean gradient measures 37.0 mmHg.  Aortic valve peak gradient measures 63.4 mmHg. Aortic valve area, by VTI  measures 0.83 cm.   Pulmonic Valve: The pulmonic valve was normal in structure. Pulmonic valve  regurgitation is mild. No evidence of pulmonic stenosis.   Aorta: The aortic root is normal in size and structure.   Venous: The inferior vena cava is normal in size with greater than 50%  respiratory variability, suggesting right atrial pressure of 3 mmHg.   IAS/Shunts: No atrial level shunt detected by color flow Doppler.     LEFT VENTRICLE  PLAX 2D  LVIDd:     5.10 cm  LVIDs:     3.10 cm  LV PW:     1.40 cm  LV IVS:    1.40 cm  LVOT diam:   2.40 cm  LV SV:     82  LV SV Index:  42  LVOT Area:   4.52 cm     RIGHT VENTRICLE      IVC  RV Basal diam: 3.50 cm  IVC diam: 1.60 cm  RV Mid diam:  3.00 cm  RV S prime:   8.59 cm/s  TAPSE (M-mode): 1.3 cm   LEFT ATRIUM       Index    RIGHT ATRIUM      Index  LA diam:    5.60 cm 2.90 cm/m  RA Area:   35.60 cm  LA Vol (A2C):  252.0 ml 130.56 ml/m RA Volume:  135.00 ml 69.94 ml/m  LA Vol (A4C):  185.0  ml 95.85 ml/m  LA Biplane Vol: 223.0 ml 115.53 ml/m  AORTIC VALVE  AV Area (Vmax):  0.87 cm  AV Area (Vmean):  0.84 cm  AV Area (VTI):   0.83 cm  AV Vmax:      398.25 cm/s  AV Vmean:     284.750 cm/s  AV VTI:       0.980 m  AV Peak Grad:   63.4 mmHg  AV Mean Grad:   37.0 mmHg  LVOT Vmax:     76.75 cm/s  LVOT Vmean:    52.700 cm/s  LVOT VTI:     0.180 m  LVOT/AV VTI ratio: 0.18    AORTA  Ao Root diam: 3.60 cm  Ao Asc diam: 3.50 cm   MITRAL VALVE        TRICUSPID VALVE  MV Area (PHT): 1.67 cm   TR Peak grad:  37.9 mmHg  MV Peak grad: 14.2 mmHg  TR Vmax:    308.00 cm/s  MV Mean grad: 4.5 mmHg  MV Vmax:    1.88 m/s   SHUNTS  MV Vmean:   89.2 cm/s  Systemic VTI: 0.18 m  MV Decel Time: 454 msec   Systemic Diam: 2.40 cm  MV E velocity: 181.00 cm/s  MV A velocity: 58.20 cm/s  MV E/A ratio: 3.11   Jenkins Rouge MD  Electronically signed by Jenkins Rouge MD  Signature Date/Time: 07/16/2020/3:21:03 PM        RIGHT/LEFT HEART CATH AND CORONARY ANGIOGRAPHY    Conclusion    Prox RCA lesion is 40% stenosed.  RPDA lesion is 60% stenosed.  1st RPL lesion is 50% stenosed.  Mid LM to Dist LM lesion is 30% stenosed.  Prox LAD to Mid LAD lesion is 50% stenosed.  1st Diag lesion is 50% stenosed.  1. Diffuse calcific nonobstructive CAD 2. Severe calcific aortic stenosis with mean gradient 37 mmHg, aortic valve area 1.05 cm 3. Mild pulmonary hypertension  Recommendation: Continue multidisciplinary team evaluation for treatment of severe symptomatic aortic stenosis   Surgeon Notes    09/07/2020 12:10 PM CV Procedure signed by Geralynn Rile, MD    Indications  Severe aortic stenosis [I35.0 (ICD-10-CM)]   Procedural Details  Technical Details INDICATION: Severe, symptomatic aortic stenosis  PROCEDURAL DETAILS: There was an indwelling IV in a right antecubital vein. Using normal sterile technique, the IV was changed out for a 5 Fr brachial sheath over a 0.018 inch wire. The right wrist was then prepped, draped, and anesthetized with 1% lidocaine. Ultrasound guidance is used for right radial  artery access. Using the modified Seldinger technique a 5/6 French Slender sheath was placed in the right radial artery. Ultrasound images are digitally captured and stored in the patient's chart. Intra-arterial verapamil was administered through the radial artery sheath. IV heparin was administered after a JR4 catheter was advanced into the central aorta. A Swan-Ganz catheter was used for the right heart catheterization. Standard protocol was followed for recording of right heart pressures and sampling of oxygen saturations. Fick cardiac output was calculated. Standard Judkins catheters were used for selective coronary angiography. LV pressure is recorded and an aortic valve pullback is performed. An AL two catheter was used to direct a straight tip wire across the aortic valve and pullback pressures were recorded. There were no immediate procedural complications. The patient was transferred to the post catheterization recovery area for further monitoring.    Estimated blood loss <50 mL.   During this procedure medications were  administered to achieve and maintain moderate conscious sedation while the patient's heart rate, blood pressure, and oxygen saturation were continuously monitored and I was present face-to-face 100% of this time.   Medications (Filter: Administrations occurring from 1048 to 1154 on 08/24/20) (important) Continuous medications are totaled by the amount administered until 08/24/20 1154.    midazolam (VERSED) injection (mg) Total dose: 1 mg  Date/Time Rate/Dose/Volume Action   08/24/20 1107 1 mg Given    lidocaine (PF) (XYLOCAINE) 1 % injection (mL) Total volume: 4 mL  Date/Time Rate/Dose/Volume Action   08/24/20 1111 2 mL Given   1113 2 mL Given    Radial Cocktail/Verapamil only (mL) Total volume: 10 mL  Date/Time Rate/Dose/Volume Action   08/24/20 1117 10 mL Given    heparin sodium (porcine) injection (Units) Total dose: 5,000  Units  Date/Time Rate/Dose/Volume Action   08/24/20 1128 5,000 Units Given    Heparin (Porcine) in NaCl 1000-0.9 UT/500ML-% SOLN (mL) Total volume: 1,000 mL  Date/Time Rate/Dose/Volume Action   08/24/20 1146 500 mL Given   1146 500 mL Given    iohexol (OMNIPAQUE) 350 MG/ML injection (mL) Total volume: 55 mL  Date/Time Rate/Dose/Volume Action   08/24/20 1153 55 mL Given    Sedation Time  Sedation Time Physician-1: 40 minutes 44 seconds   Contrast  Medication Name Total Dose  iohexol (OMNIPAQUE) 350 MG/ML injection 55 mL    Radiation/Fluoro  Fluoro time: 7.6 (min) DAP: 21229 (mGycm2) Cumulative Air Kerma: 336 (mGy)   Coronary Findings   Diagnostic Dominance: Right  Left Main  Mid LM to Dist LM lesion is 30% stenosed. The lesion is calcified.  Left Anterior Descending  Prox LAD to Mid LAD lesion is 50% stenosed. The lesion is moderately calcified.  First Diagonal Branch  1st Diag lesion is 50% stenosed.  Left Circumflex  There is mild diffuse disease throughout the vessel.  Right Coronary Artery  Vessel is large.  Prox RCA lesion is 40% stenosed. The lesion is moderately calcified.  Right Posterior Descending Artery  RPDA lesion is 60% stenosed. The lesion is calcified.  First Right Posterolateral Branch  1st RPL lesion is 50% stenosed.   Intervention   No interventions have been documented.  Left Heart  Mitral Valve The annulus is calcified.  Aortic Valve There is severe aortic valve stenosis. The aortic valve is calcified. There is restricted aortic valve motion. Severe aortic stenosis is present with peak to peak gradient 47 mmHg, mean gradient 37 mmHg, Peak instantaneous gradient 53 mmHg, calculated aortic valve area 1.05 cm   Coronary Diagrams   Diagnostic Dominance: Right    Intervention    Implants  No implant documentation for this case.    Syngo Images  Show images for CARDIAC  CATHETERIZATION  Images on Long Term Storage  Show images for Jontez, [place] to Procedure Log  Procedure Log     Hemo Data  Flowsheet Row Most Recent Value  Fick Cardiac Output 5.19 L/min  Fick Cardiac Output Index 2.67 (L/min)/BSA  Aortic Mean Gradient 36.89 mmHg  Aortic Peak Gradient 47 mmHg  Aortic Valve Area 1.05  Aortic Value Area Index 0.54 cm2/BSA  RA A Wave 9 mmHg  RA V Wave 10 mmHg  RA Mean 8 mmHg  RV Systolic Pressure 48 mmHg  RV Diastolic Pressure 4 mmHg  RV EDP 8 mmHg  PA Systolic Pressure 54 mmHg  PA Diastolic Pressure 16 mmHg  PA Mean 32 mmHg  PW A Wave 23 mmHg  PW V Wave 29 mmHg  PW Mean 22 mmHg  AO Systolic Pressure 97 mmHg  AO Diastolic Pressure 64 mmHg  AO Mean 78 mmHg  LV Systolic Pressure 195 mmHg  LV Diastolic Pressure 7 mmHg  LV EDP 16 mmHg  AOp Systolic Pressure 093 mmHg  AOp Diastolic Pressure 59 mmHg  AOp Mean Pressure 77 mmHg  LVp Systolic Pressure 267 mmHg  LVp Diastolic Pressure 4 mmHg  LVp EDP Pressure 13 mmHg  QP/QS 1  TPVR Index 12 HRUI  TSVR Index 29.24 HRUI  PVR SVR Ratio 0.14  TPVR/TSVR Ratio 0.41        TRANSESOPHOGEAL ECHO REPORT       Patient Name:  JAN OLANO Date of Exam: 09/07/2020  Medical Rec #: [id number] Height:    66.0 in  Accession #:  [id number] Weight:    186.5 lb  Date of Birth: 03/26/1927  BSA:     1.941 m  Patient Age:  49 years  BP:      132/79 mmHg  Patient Gender: M     HR:      82 bpm.  Exam Location: Outpatient   Procedure: 3D Echo, Transesophageal Echo, Cardiac Doppler, Color Doppler  and       Saline Contrast Bubble Study   Indications:   I35.0 Nonrheumatic aortic (valve) stenosis    History:     Patient has prior history of Echocardiogram examinations,  most          recent 07/16/2020. CHF, COPD, Aortic Valve Disease;          Arrythmias:Atrial Fibrillation and RBBB.    Sonographer:   Tiffany Dance   Referring Phys: [id number] Eileen Stanford  Diagnosing Phys: Eleonore Chiquito MD   PROCEDURE: After discussion of the risks and benefits of a TEE, an  informed consent was obtained from the patient. TEE procedure time was 40  minutes. The transesophogeal probe was passed without difficulty through  the esophogus of the patient. Local  oropharyngeal anesthetic was provided with Cetacaine. Sedation performed  by different physician. The patient was monitored while under deep  sedation. Anesthestetic sedation was provided intravenously by  Anesthesiology: 392m of Propofol, 336mof  Lidocaine. Image quality was excellent. The patient's vital signs;  including heart rate, blood pressure, and oxygen saturation; remained  stable throughout the procedure. The patient developed no complications  during the procedure.   IMPRESSIONS    1. The MV is degenerative with a small flail P2 segment. 2D VC 0.34 cm,  2D ERO 0.27 cm2 (79 degrees), R vol 28 cc. 3D VCA 0.13 cm2. The flail gap  is 0.21 cm and flail width 0.52 cm. The jet is eccentric and there is  systolic pulmonary flow reversal in  the left upper pulmonary vein, but due to the small flail segment, this is  not likely hemodynamically significant. There is mild mitral stenosis with  3D MVA 2.3 cm2. MG 3.8 mmHG @ 78 bpm. The mitral valve is degenerative.  Mild to moderate mitral valve  regurgitation. Mild mitral stenosis. The mean mitral valve gradient is 3.8  mmHg with average heart rate of 75 bpm. Severe mitral annular  calcification.  2. The bubble study was positive after 6 cardiac cycles, indicating there  is an intrapulmonary shunt. There is no interatrial shunt. Agitated saline  contrast bubble study was positive with shunting observed after >6 cardiac  cycles suggestive of  intrapulmonary shunting.  3. The aortic valve is tricuspid. There is severe  calcifcation of the  aortic valve. There is severe thickening of the aortic  valve. Aortic valve  regurgitation is mild. Severe aortic valve stenosis. Aortic valve area, by  VTI measures 0.52 cm. Aortic  valve Vmax measures 3.94 m/s.  4. Left ventricular ejection fraction, by estimation, is 50 to 55%. The  left ventricle has low normal function. The left ventricle has no regional  wall motion abnormalities.  5. Right ventricular systolic function is mildly reduced. The right  ventricular size is moderately enlarged.  6. Left atrial size was severely dilated. No left atrial/left atrial  appendage thrombus was detected. The LAA emptying velocity was 32 cm/s.  7. Right atrial size was severely dilated.  8. The tricuspid valve is abnormal. Tricuspid valve regurgitation is  moderate to severe.  9. Pulmonic valve regurgitation is moderate.  10. Severely dilated pulmonary artery.   FINDINGS  Left Ventricle: Left ventricular ejection fraction, by estimation, is 50  to 55%. The left ventricle has low normal function. The left ventricle has  no regional wall motion abnormalities. The left ventricular internal  cavity size was normal in size.   Right Ventricle: The right ventricular size is moderately enlarged. No  increase in right ventricular wall thickness. Right ventricular systolic  function is mildly reduced.   Left Atrium: Left atrial size was severely dilated. No left atrial/left  atrial appendage thrombus was detected. The LAA emptying velocity was 32  cm/s.   Right Atrium: Right atrial size was severely dilated.   Pericardium: There is no evidence of pericardial effusion.   Mitral Valve: The MV is degenerative with a small flail P2 segment. 2D VC  0.34 cm, 2D ERO 0.27 cm2 (79 degrees), R vol 28 cc. 3D VCA 0.13 cm2. The  flail gap is 0.21 cm and flail width 0.52 cm. The jet is eccentric and  there is systolic pulmonary flow  reversal in the left upper pulmonary vein, but due to the small flail  segment, this is not likely hemodynamically  significant. There is mild  mitral stenosis with 3D MVA 2.3 cm2. MG 3.8 mmHG @ 78 bpm. The mitral  valve is degenerative in appearance. Severe  mitral annular calcification. Mild to moderate mitral valve  regurgitation. Mild mitral valve stenosis. The mean mitral valve gradient  is 3.8 mmHg with average heart rate of 75 bpm.   Tricuspid Valve: The tricuspid valve is abnormal. Tricuspid valve  regurgitation is moderate to severe. No evidence of tricuspid stenosis.   Aortic Valve: The aortic valve is tricuspid. There is severe calcifcation  of the aortic valve. There is severe thickening of the aortic valve.  Aortic valve regurgitation is mild. Severe aortic stenosis is present.  Aortic valve mean gradient measures 38.1  mmHg. Aortic valve peak gradient measures 62.1 mmHg. Aortic valve area,  by VTI measures 0.52 cm.   Pulmonic Valve: The pulmonic valve was grossly normal. Pulmonic valve  regurgitation is moderate. No evidence of pulmonic stenosis.   Aorta: The aortic root and ascending aorta are structurally normal, with  no evidence of dilitation.   Pulmonary Artery: The pulmonary artery is severely dilated.   Venous: A pattern of systolic flow reversal, suggestive of severe mitral  regurgitation is recorded from the left upper pulmonary vein.   IAS/Shunts: The atrial septum is grossly normal. Agitated saline contrast  was given intravenously to evaluate for intracardiac shunting. Agitated  saline contrast bubble study was positive with shunting observed after >6  cardiac cycles suggestive of  intrapulmonary  shunting. The bubble study was positive after 6 cardiac  cycles, indicating there is an intrapulmonary shunt. There is no  interatrial shunt.     LEFT VENTRICLE  PLAX 2D  LVOT diam:   2.20 cm  LV SV:     53  LV SV Index:  27  LVOT Area:   3.80 cm     AORTIC VALVE  AV Area (Vmax):  0.61 cm  AV Area (Vmean):  0.59 cm  AV Area (VTI):   0.52 cm   AV Vmax:      394.08 cm/s  AV Vmean:     294.660 cm/s  AV VTI:      1.032 m  AV Peak Grad:   62.1 mmHg  AV Mean Grad:   38.1 mmHg  LVOT Vmax:     62.82 cm/s  LVOT Vmean:    46.070 cm/s  LVOT VTI:     0.140 m  LVOT/AV VTI ratio: 0.14    AORTA  Ao Root diam: 2.60 cm  Ao Asc diam: 3.06 cm   MITRAL VALVE         TRICUSPID VALVE  MV Area VTI: 1.45 cm    TR Peak grad:  44.9 mmHg  MV Mean grad: 3.8 mmHg    TR Vmax:    335.00 cm/s  MV VTI:    0.37 m  MR Peak grad:  46.2 mmHg  SHUNTS  MR Vmax:     339.91 cm/s Systemic VTI: 0.14 m  MR PISA:     9.05 cm  Systemic Diam: 2.20 cm  MR PISA Eff ROA: 103 mm  MR PISA Radius: 1.20 cm   Eleonore Chiquito MD  Electronically signed by Eleonore Chiquito MD  Signature Date/Time: 09/07/2020/9:20:02 PM     Cardiac TAVR CT  TECHNIQUE: The patient was scanned on a [employer]. A 100 kV retrospective scan was triggered in the descending thoracic aorta at 111 HU's. Gantry rotation speed was 250 msecs and collimation was .6 mm. No beta blockade or nitro were given. The 3D data set was reconstructed in 5% intervals of the R-R cycle. Systolic and diastolic phases were analyzed on a dedicated work station using MPR, MIP and VRT modes. The patient received 80 cc of contrast.  FINDINGS: Image quality: Average. The entire left ventricle was not imaged.  Noise artifact is: Moderate cardiac motion artifact. Millisecond reconstruction performed.  Valve Morphology: The aortic valve is tricuspid with diffuse severe calcifications. The leaflets are severe restricted in systole consistent with severe aortic stenosis.  Aortic Valve Calcium score: 4577  Aortic annular dimension:  Phase assessed: 200 ms  Annular area: 448 mm2  Annular perimeter: 75.9 mm  Max diameter: 26.8 mm  Min diameter: 21.9 mm  Annular and subannular calcification: No annular  calcification. There is minimal subannular calcification under the [place].  Optimal coplanar projection: LAO 27 CAU 4  Coronary Artery Height above Annulus:  Left Main: 12.6 mm  Right Coronary: 17.0 mm  Sinus of Valsalva Measurements:  Non-coronary: 30 mm  Right-coronary: 31 mm  Left-coronary: 31 mm  Sinus of Valsalva Height:  Non-coronary: 23.0 mm  Right-coronary: 21.3 mm  Left-coronary: 19.8 mm  Sinotubular Junction: 28 mm  Ascending Thoracic Aorta: 34 mm  Coronary Arteries: Normal coronary origin. Right dominance. The study was performed without use of NTG and is insufficient for plaque evaluation. Please refer to recent cardiac catheterization for coronary assessment. 3-vessel calcifications noted.  Cardiac Morphology:  Right Atrium: Right atrial size is  dilated.  Right Ventricle: The right ventricular cavity is dilated.  Left Atrium: Left atrial size is dilated in size. There is contrast mixing artifact in the LAA.  Left Ventricle: The ventricular cavity size is within normal limits and there are no stigmata of prior infarction in the visualized portion.  Pulmonary arteries: Dilated in size without proximal filling defect. Findings suggestive of pulmonary hypertension.  Pulmonary veins: Normal pulmonary venous drainage.  Pericardium: Normal thickness with no significant effusion or calcium present.  Mitral Valve: The mitral valve is degenerative with severe mitral annular calcification.  Extra-cardiac findings: See attached radiology report for non-cardiac structures.  IMPRESSION: 1. Trileaflet aortic valve with severe aortic stenosis (calcium score 4577).  2. Cardiac motion artifact present and annular measurements performed using 200 ms reconstruction.  3. Annular measurements favorable for 26 mm Edwards Sapien 3 TAVR (448 mm2).  4. No significant annular calcification. Minimal subannular calcification under  the [place].  5. Sufficient coronary to annulus distance.  6. Optimal Fluoroscopic Angle for Delivery: LAO 27 CAU 4  7. Dilated pulmonary artery suggestive of pulmonary hypertension.  8. Contrast mixing artifact is present in the LAA.  9. Severe mitral annular calcification.  [place] T. Audie Box, MD   Electronically Signed By: Eleonore Chiquito On: 08/30/2020 18:12    CT ANGIOGRAPHY CHEST, ABDOMEN AND PELVIS  TECHNIQUE: Non-contrast CT of the chest was initially obtained.  Multidetector CT imaging through the chest, abdomen and pelvis was performed using the standard protocol during bolus administration of intravenous contrast. Multiplanar reconstructed images and MIPs were obtained and reviewed to evaluate the vascular anatomy.  CONTRAST: 78m OMNIPAQUE IOHEXOL 350 MG/ML SOLN  COMPARISON: No priors.  FINDINGS: CTA CHEST FINDINGS  Cardiovascular: Heart size is enlarged with biatrial dilatation. There is no significant pericardial fluid, thickening or pericardial calcification. There is aortic atherosclerosis, as well as atherosclerosis of the great vessels of the mediastinum and the coronary arteries, including calcified atherosclerotic plaque in the left main, left anterior descending, left circumflex and right coronary arteries. Severe thickening calcification of the aortic valve. Severe calcifications of the mitral annulus. Dilatation of the pulmonic trunk (4.1 cm in diameter).  Mediastinum/Lymph Nodes: No pathologically enlarged mediastinal or hilar lymph nodes. Multiple densely calcified right hilar and mediastinal lymph nodes are incidentally noted. Esophagus is unremarkable in appearance. No axillary lymphadenopathy.  Lungs/Pleura: Moderate bilateral pleural effusions lying dependently with areas of passive subsegmental atelectasis in the lower lobes of the lungs bilaterally. In the right lower lobe (axial image 90 of series 6 and coronal  image 101 of series 8) there is an elongated nodular density measuring 1.4 x 2.1 x 2.8 cm which appears associated with a pulmonary artery branch to the right lower lobe, and demonstrates enhancement characteristics similar to adjacent vasculature, suspicious for a pulmonary arteriovenous malformation. No other definite suspicious appearing pulmonary nodules or masses are noted. No acute consolidative airspace disease. Mild diffuse bronchial wall thickening with mild centrilobular and paraseptal emphysema.  Musculoskeletal/Soft Tissues: There are no aggressive appearing lytic or blastic lesions noted in the visualized portions of the skeleton.  CTA ABDOMEN AND PELVIS FINDINGS  Hepatobiliary: No suspicious cystic or solid hepatic lesions. No intra or extrahepatic biliary ductal dilatation. Calcified gallstone measuring 1.1 cm in diameter in the dependent portion of the gallbladder. No findings to suggest an acute cholecystitis at this time.  Pancreas: No pancreatic mass. No pancreatic ductal dilatation. No pancreatic or peripancreatic fluid collections or inflammatory changes.  Spleen: Unremarkable.  Adrenals/Urinary Tract: Low-attenuation lesions in  the left kidney, largest of which is in the interpolar region where there is an exophytic 5.2 cm lesion, compatible with simple cysts. Other subcentimeter low-attenuation lesions in the right kidney are too small to definitively characterize, but statistically likely to represent tiny cysts. No aggressive appearing renal lesions. No hydroureteronephrosis. Urinary bladder is nearly decompressed, but otherwise unremarkable in appearance.  Stomach/Bowel: The appearance of the stomach is normal. No pathologic dilatation of small bowel or colon. Short segment of distal small bowel extends into a small right inguinal hernia. The appendix is not confidently identified and may be surgically absent. Regardless, there are no  inflammatory changes noted adjacent to the cecum to suggest the presence of an acute appendicitis at this time.  Vascular/Lymphatic: Aortic atherosclerosis, with vascular findings and measurements pertinent to potential TAVR procedure, as detailed below. No aneurysm or dissection noted in the abdominal or pelvic vasculature. No lymphadenopathy noted in the abdomen or pelvis.  Reproductive: Prostate gland is enlarged measuring up to 5.2 x 4.1 x 5.9 cm. Seminal vesicles are unremarkable in appearance.  Other: Small right inguinal hernia containing a short loop of distal small bowel (likely ileum). No significant volume of ascites. No pneumoperitoneum.  Musculoskeletal: There are no aggressive appearing lytic or blastic lesions noted in the visualized portions of the skeleton.  VASCULAR MEASUREMENTS PERTINENT TO TAVR:  AORTA:  Minimal Aortic Diameter-13 x 12 mm  Severity of Aortic Calcification-severe  RIGHT PELVIS:  Right Common Iliac Artery -  Minimal Diameter-8.6 x 9.7 mm  Tortuosity-mild  Calcification-moderate  Right External Iliac Artery -  Minimal Diameter-7.0 x 7.1 mm  Tortuosity-severe  Calcification-mild  Right Common Femoral Artery -  Minimal Diameter-8.4 x 4.3 mm  Tortuosity-mild  Calcification-moderate to severe  LEFT PELVIS:  Left Common Iliac Artery -  Minimal Diameter-9.5 x 9.2 mm  Tortuosity-mild  Calcification-moderate  Left External Iliac Artery -  Minimal Diameter-7.4 x 6.7 mm  Tortuosity-severe  Calcification-none  Left Common Femoral Artery -  Minimal Diameter-8.6 x 7.1 mm  Tortuosity-mild  Calcification-severe  Review of the MIP images confirms the above findings.  IMPRESSION: 1. Vascular findings and measurements pertinent to potential TAVR procedure, as detailed above. 2. Severe thickening calcification of the aortic valve, compatible with reported clinical history of severe  aortic stenosis. 3. Severe dilatation of the pulmonic trunk (4.1 cm in diameter), concerning for pulmonary arterial hypertension. Notably, there is also an elongated nodular density in the right lower lobe which has enhancement characteristics suggestive of a vascular lesions such as a pulmonary arteriovenous malformation. This could be confirmed with PE protocol CT scan if of clinical concern. 4. Severe calcifications of the mitral annulus. 5. Cardiomegaly with biatrial dilatation. 6. Moderate bilateral pleural effusions with areas of passive subsegmental atelectasis in the dependent portions of the lower lobes of the lungs bilaterally. 7. Small right inguinal hernia containing a short segment of distal small bowel, without evidence of bowel incarceration or obstruction at this time. 8. Cholelithiasis without evidence of acute cholecystitis. 9. Prostatomegaly. 10. Additional incidental findings, as above.  EKG:  EKG is NOT ordered today.    Recent Labs: 07/15/2020: TSH 0.398 10/12/2020: NT-Pro BNP 7,048 10/13/2020: ALT 16; B Natriuretic Peptide 717.2 10/14/2020: Hemoglobin 10.7; Platelets 321 10/16/2020: Magnesium 1.8 10/20/2020: BUN 20; Creatinine, Ser 0.78; Potassium 4.5; Sodium 137  Recent Lipid Panel No results found for: CHOL, TRIG, HDL, CHOLHDL, VLDL, LDLCALC, LDLDIRECT   Risk Assessment/Calculations:    CHA2DS2-VASc Score = 5  This indicates a 7.2% annual risk of  stroke. The patient's score is based upon: CHF History: Yes HTN History: Yes Diabetes History: Yes Stroke History: No Vascular Disease History: No Age Score: 2 Gender Score: 0      Physical Exam:    VS:  BP 110/70   Pulse 95   Ht _0  (1.702 m)   Wt 199 lb (90.3 kg) Comment: patient attempted to stand unable. Wt. accomplished 2nd time  SpO2 96%   BMI 31.17 kg/m     Wt Readings from Last 3 Encounters:  10/26/20 199 lb (90.3 kg)  10/20/20 202 lb (91.6 kg)  10/17/20 201 lb 12.8 oz (91.5 kg)      GEN: Well nourished, well developed in no acute distress in wheelchair HEENT: Normal NECK: No JVD LYMPHATICS: No lymphadenopathy CARDIAC: Ireg Ireg. 3/6 SEM @ RUSB and 2/6 holosystolic murmur. No rubs, gallops. LLE +2 edema, RLE +1 edema RESPIRATORY:  Clear to auscultation without rales, wheezing or rhonchi  ABDOMEN: Soft, non-tender, non-distended MUSCULOSKELETAL:  No edema; No deformity  SKIN: Warm and dry. NEUROLOGIC:  Alert and oriented x 3 PSYCHIATRIC:  Normal affect   ASSESSMENT:    1. Acute on chronic diastolic heart failure ([place])   2. Severe aortic stenosis   3. Chronic atrial fibrillation ([place])   4. Type III open fracture of left hip with routine healing, subsequent encounter    PLAN:    In order of problems listed above:  Acute on chronic diastolic CHF: Volume status looks much improved. Weight is improved, 199 lbs today. Will increase his torsemide to 34m x 2 days followed by torsemide 459mdaily. Baseline weight ~190. He has NYHA class II symptoms.  Severe AS: He has known severe AS and plan for TAVR procedure in the near future. His heart failure is better controlled and he is making more progress functionally. He is anxious to have his valve replaced Instructed him to stop his warfarin tomorrow for anticipated TAVR procedure on 3/29. Instructed son to verify with facility that he will maintain his rehab bed after TAVR procedure.  Chronic afib: Rate well controlled. No changes made. Will stop Coumadin tomorrow for anticipated TAVR procedure on 3/29.   Hip fracture s/p ORIF: Making significant progress with hip. He is able to ambulate more with the walker. His son reports slightly improved ambulation.      Medication Adjustments/Labs and Tests Ordered: Current medicines are reviewed at length with the patient today.  Concerns regarding medicines are outlined above.  No orders of the defined types were placed in this encounter.  No orders of the defined types  were placed in this encounter.   Patient Instructions  Medication Instructions:  1) INCREASE TORSEMIDE to 60 mg daily for 2 days then resume 40 mg daily 2) STOP COUMADIN after today's dose *If you need a refill on your cardiac medications before your next appointment, please call your pharmacy*   Follow-Up: LaAnder Purpuraill MyChart message you instructions.     Signed, KaAngelena FormPA-C  10/26/2020 4:27 PM     [facility]

## 2020-10-26 NOTE — Patient Instructions (Addendum)
Medication Instructions:  1) INCREASE TORSEMIDE to 60 mg daily for 2 days then resume 40 mg daily 2) STOP COUMADIN after today's dose *If you need a refill on your cardiac medications before your next appointment, please call your pharmacy*   Follow-Up: Leotis Shames will MyChart message you instructions.

## 2020-10-28 ENCOUNTER — Encounter (HOSPITAL_COMMUNITY): Payer: Self-pay

## 2020-10-28 ENCOUNTER — Other Ambulatory Visit: Payer: Self-pay

## 2020-10-28 ENCOUNTER — Inpatient Hospital Stay (HOSPITAL_COMMUNITY)
Admission: EM | Admit: 2020-10-28 | Discharge: 2020-11-05 | DRG: 266 | Disposition: A | Discharge status: Skilled Nursing Facility | Payer: Medicare Other | Source: Skilled Nursing Facility | Attending: Cardiovascular Disease | Admitting: Cardiovascular Disease

## 2020-10-28 ENCOUNTER — Emergency Department (HOSPITAL_COMMUNITY): Payer: Medicare Other

## 2020-10-28 DIAGNOSIS — I2510 Atherosclerotic heart disease of native coronary artery without angina pectoris: Secondary | ICD-10-CM | POA: Diagnosis present

## 2020-10-28 DIAGNOSIS — J9621 Acute and chronic respiratory failure with hypoxia: Secondary | ICD-10-CM | POA: Diagnosis present

## 2020-10-28 DIAGNOSIS — Z9049 Acquired absence of other specified parts of digestive tract: Secondary | ICD-10-CM

## 2020-10-28 DIAGNOSIS — E669 Obesity, unspecified: Secondary | ICD-10-CM | POA: Diagnosis present

## 2020-10-28 DIAGNOSIS — I350 Nonrheumatic aortic (valve) stenosis: Secondary | ICD-10-CM | POA: Diagnosis not present

## 2020-10-28 DIAGNOSIS — K4090 Unilateral inguinal hernia, without obstruction or gangrene, not specified as recurrent: Secondary | ICD-10-CM | POA: Diagnosis present

## 2020-10-28 DIAGNOSIS — L97429 Non-pressure chronic ulcer of left heel and midfoot with unspecified severity: Secondary | ICD-10-CM | POA: Diagnosis present

## 2020-10-28 DIAGNOSIS — J449 Chronic obstructive pulmonary disease, unspecified: Secondary | ICD-10-CM | POA: Diagnosis present

## 2020-10-28 DIAGNOSIS — I4821 Permanent atrial fibrillation: Secondary | ICD-10-CM | POA: Diagnosis present

## 2020-10-28 DIAGNOSIS — Z006 Encounter for examination for normal comparison and control in clinical research program: Secondary | ICD-10-CM | POA: Diagnosis not present

## 2020-10-28 DIAGNOSIS — I5033 Acute on chronic diastolic (congestive) heart failure: Secondary | ICD-10-CM | POA: Diagnosis present

## 2020-10-28 DIAGNOSIS — Z9889 Other specified postprocedural states: Secondary | ICD-10-CM

## 2020-10-28 DIAGNOSIS — D649 Anemia, unspecified: Secondary | ICD-10-CM | POA: Diagnosis present

## 2020-10-28 DIAGNOSIS — Z96653 Presence of artificial knee joint, bilateral: Secondary | ICD-10-CM | POA: Diagnosis present

## 2020-10-28 DIAGNOSIS — Z87891 Personal history of nicotine dependence: Secondary | ICD-10-CM

## 2020-10-28 DIAGNOSIS — I4510 Unspecified right bundle-branch block: Secondary | ICD-10-CM | POA: Insufficient documentation

## 2020-10-28 DIAGNOSIS — Z7901 Long term (current) use of anticoagulants: Secondary | ICD-10-CM | POA: Diagnosis not present

## 2020-10-28 DIAGNOSIS — Z6829 Body mass index (BMI) 29.0-29.9, adult: Secondary | ICD-10-CM

## 2020-10-28 DIAGNOSIS — E876 Hypokalemia: Secondary | ICD-10-CM | POA: Diagnosis present

## 2020-10-28 DIAGNOSIS — I482 Chronic atrial fibrillation, unspecified: Secondary | ICD-10-CM | POA: Diagnosis not present

## 2020-10-28 DIAGNOSIS — R001 Bradycardia, unspecified: Secondary | ICD-10-CM | POA: Diagnosis present

## 2020-10-28 DIAGNOSIS — Z952 Presence of prosthetic heart valve: Secondary | ICD-10-CM | POA: Diagnosis not present

## 2020-10-28 DIAGNOSIS — E11621 Type 2 diabetes mellitus with foot ulcer: Secondary | ICD-10-CM | POA: Diagnosis present

## 2020-10-28 DIAGNOSIS — I083 Combined rheumatic disorders of mitral, aortic and tricuspid valves: Secondary | ICD-10-CM | POA: Diagnosis present

## 2020-10-28 DIAGNOSIS — Z01818 Encounter for other preprocedural examination: Secondary | ICD-10-CM

## 2020-10-28 DIAGNOSIS — I700 Atherosclerosis of aorta: Secondary | ICD-10-CM | POA: Diagnosis present

## 2020-10-28 DIAGNOSIS — I110 Hypertensive heart disease with heart failure: Secondary | ICD-10-CM | POA: Diagnosis present

## 2020-10-28 DIAGNOSIS — I10 Essential (primary) hypertension: Secondary | ICD-10-CM | POA: Diagnosis not present

## 2020-10-28 DIAGNOSIS — M79672 Pain in left foot: Secondary | ICD-10-CM | POA: Diagnosis present

## 2020-10-28 DIAGNOSIS — S72102A Unspecified trochanteric fracture of left femur, initial encounter for closed fracture: Secondary | ICD-10-CM | POA: Diagnosis present

## 2020-10-28 DIAGNOSIS — Z8781 Personal history of (healed) traumatic fracture: Secondary | ICD-10-CM

## 2020-10-28 DIAGNOSIS — Z79899 Other long term (current) drug therapy: Secondary | ICD-10-CM

## 2020-10-28 DIAGNOSIS — Z20822 Contact with and (suspected) exposure to covid-19: Secondary | ICD-10-CM | POA: Diagnosis present

## 2020-10-28 DIAGNOSIS — N400 Enlarged prostate without lower urinary tract symptoms: Secondary | ICD-10-CM | POA: Diagnosis present

## 2020-10-28 DIAGNOSIS — I509 Heart failure, unspecified: Secondary | ICD-10-CM

## 2020-10-28 DIAGNOSIS — I340 Nonrheumatic mitral (valve) insufficiency: Secondary | ICD-10-CM | POA: Diagnosis present

## 2020-10-28 DIAGNOSIS — Z9981 Dependence on supplemental oxygen: Secondary | ICD-10-CM

## 2020-10-28 DIAGNOSIS — I5030 Unspecified diastolic (congestive) heart failure: Secondary | ICD-10-CM | POA: Diagnosis present

## 2020-10-28 DIAGNOSIS — I5031 Acute diastolic (congestive) heart failure: Secondary | ICD-10-CM | POA: Diagnosis not present

## 2020-10-28 LAB — COMPREHENSIVE METABOLIC PANEL
ALT: 9 U/L (ref 0–44)
AST: 19 U/L (ref 15–41)
Albumin: 2.7 g/dL — ABNORMAL LOW (ref 3.5–5.0)
Alkaline Phosphatase: 107 U/L (ref 38–126)
Anion gap: 8 (ref 5–15)
BUN: 10 mg/dL (ref 8–23)
CO2: 37 mmol/L — ABNORMAL HIGH (ref 22–32)
Calcium: 8.3 mg/dL — ABNORMAL LOW (ref 8.9–10.3)
Chloride: 93 mmol/L — ABNORMAL LOW (ref 98–111)
Creatinine, Ser: 0.96 mg/dL (ref 0.61–1.24)
GFR, Estimated: >60 mL/min (ref >60)
Glucose, Bld: 109 mg/dL — ABNORMAL HIGH (ref 70–99)
Potassium: 3.3 mmol/L — ABNORMAL LOW (ref 3.5–5.1)
Sodium: 138 mmol/L (ref 135–145)
Total Bilirubin: 1.1 mg/dL (ref 0.3–1.2)
Total Protein: 6.1 g/dL — ABNORMAL LOW (ref 6.5–8.1)

## 2020-10-28 LAB — CBC WITH DIFFERENTIAL/PLATELET
Abs Immature Granulocytes: 0.04 10*3/uL (ref 0.00–0.07)
Basophils Absolute: 0.1 10*3/uL (ref 0.0–0.1)
Basophils Relative: 2 %
Eosinophils Absolute: 0.5 10*3/uL (ref 0.0–0.5)
Eosinophils Relative: 6 %
HCT: 32.9 % — ABNORMAL LOW (ref 39.0–52.0)
Hemoglobin: 10.2 g/dL — ABNORMAL LOW (ref 13.0–17.0)
Immature Granulocytes: 1 %
Lymphocytes Relative: 11 %
Lymphs Abs: 0.9 10*3/uL (ref 0.7–4.0)
MCH: 31.1 pg (ref 26.0–34.0)
MCHC: 31 g/dL (ref 30.0–36.0)
MCV: 100.3 fL — ABNORMAL HIGH (ref 80.0–100.0)
Monocytes Absolute: 0.7 10*3/uL (ref 0.1–1.0)
Monocytes Relative: 8 %
Neutro Abs: 5.8 10*3/uL (ref 1.7–7.7)
Neutrophils Relative %: 72 %
Platelets: 223 10*3/uL (ref 150–400)
RBC: 3.28 MIL/uL — ABNORMAL LOW (ref 4.22–5.81)
RDW: 15.9 % — ABNORMAL HIGH (ref 11.5–15.5)
WBC: 8 10*3/uL (ref 4.0–10.5)
nRBC: 0 % (ref 0.0–0.2)

## 2020-10-28 LAB — I-STAT ARTERIAL BLOOD GAS, ED
Acid-Base Excess: 14 mmol/L — ABNORMAL HIGH (ref 0.0–2.0)
Bicarbonate: 39.5 mmol/L — ABNORMAL HIGH (ref 20.0–28.0)
Calcium, Ion: 1.13 mmol/L — ABNORMAL LOW (ref 1.15–1.40)
HCT: 31 % — ABNORMAL LOW (ref 39.0–52.0)
Hemoglobin: 10.5 g/dL — ABNORMAL LOW (ref 13.0–17.0)
O2 Saturation: 92 %
Potassium: 3.2 mmol/L — ABNORMAL LOW (ref 3.5–5.1)
Sodium: 138 mmol/L (ref 135–145)
TCO2: 41 mmol/L — ABNORMAL HIGH (ref 22–32)
pCO2 arterial: 56.5 mmHg — ABNORMAL HIGH (ref 32.0–48.0)
pH, Arterial: 7.452 — ABNORMAL HIGH (ref 7.350–7.450)
pO2, Arterial: 62 mmHg — ABNORMAL LOW (ref 83.0–108.0)

## 2020-10-28 LAB — BRAIN NATRIURETIC PEPTIDE: B Natriuretic Peptide: 498.5 pg/mL — ABNORMAL HIGH (ref 0.0–100.0)

## 2020-10-28 LAB — PROTIME-INR
INR: 2.9 — ABNORMAL HIGH (ref 0.8–1.2)
Prothrombin Time: 29.1 seconds — ABNORMAL HIGH (ref 11.4–15.2)

## 2020-10-28 LAB — MAGNESIUM: Magnesium: 1.6 mg/dL — ABNORMAL LOW (ref 1.7–2.4)

## 2020-10-28 LAB — SARS CORONAVIRUS 2 (TAT 6-24 HRS): SARS Coronavirus 2: NEGATIVE

## 2020-10-28 MED ORDER — UMECLIDINIUM BROMIDE 62.5 MCG/INH IN AEPB
1.0000 | INHALATION_SPRAY | Freq: Every day | RESPIRATORY_TRACT | Status: DC
  Administered 2020-10-29 – 2020-11-05 (×5): 1 via RESPIRATORY_TRACT
  Filled 2020-10-28 (×2): qty 7

## 2020-10-28 MED ORDER — ONDANSETRON HCL 4 MG/2ML IJ SOLN: 4.0000 mg | Freq: Four times a day (QID) | INTRAMUSCULAR | Status: DC | PRN

## 2020-10-28 MED ORDER — ACETAMINOPHEN 325 MG PO TABS
650.0000 mg | ORAL_TABLET | ORAL | Status: DC | PRN
  Administered 2020-10-28 – 2020-10-31 (×5): 650 mg via ORAL
  Filled 2020-10-28 (×5): qty 2

## 2020-10-28 MED ORDER — FERROUS GLUCONATE 324 (38 FE) MG PO TABS
324.0000 mg | ORAL_TABLET | ORAL | Status: DC
  Administered 2020-10-28 – 2020-11-05 (×4): 324 mg via ORAL
  Filled 2020-10-28 (×5): qty 1

## 2020-10-28 MED ORDER — SENNOSIDES-DOCUSATE SODIUM 8.6-50 MG PO TABS
2.0000 | ORAL_TABLET | Freq: Every day | ORAL | Status: DC
  Administered 2020-10-28 – 2020-11-05 (×7): 2 via ORAL
  Filled 2020-10-28 (×8): qty 2

## 2020-10-28 MED ORDER — ALBUTEROL SULFATE (2.5 MG/3ML) 0.083% IN NEBU: 2.5000 mg | INHALATION_SOLUTION | Freq: Two times a day (BID) | RESPIRATORY_TRACT | Status: DC

## 2020-10-28 MED ORDER — TAMSULOSIN HCL 0.4 MG PO CAPS
0.8000 mg | ORAL_CAPSULE | Freq: Every day | ORAL | Status: DC
  Administered 2020-10-28 – 2020-11-04 (×8): 0.8 mg via ORAL
  Filled 2020-10-28 (×8): qty 2

## 2020-10-28 MED ORDER — POTASSIUM CHLORIDE CRYS ER 20 MEQ PO TBCR
20.0000 meq | EXTENDED_RELEASE_TABLET | Freq: Two times a day (BID) | ORAL | Status: DC
  Administered 2020-10-28 – 2020-10-29 (×2): 20 meq via ORAL
  Filled 2020-10-28 (×2): qty 1

## 2020-10-28 MED ORDER — FUROSEMIDE 10 MG/ML IJ SOLN
60.0000 mg | Freq: Two times a day (BID) | INTRAMUSCULAR | Status: DC
  Administered 2020-10-28 – 2020-10-29 (×2): 60 mg via INTRAVENOUS
  Filled 2020-10-28 (×2): qty 6

## 2020-10-28 MED ORDER — METOPROLOL SUCCINATE ER 50 MG PO TB24
50.0000 mg | ORAL_TABLET | Freq: Every day | ORAL | Status: DC
  Administered 2020-10-28 – 2020-10-31 (×4): 50 mg via ORAL
  Filled 2020-10-28 (×4): qty 1

## 2020-10-28 MED ORDER — FUROSEMIDE 10 MG/ML IJ SOLN
40.0000 mg | Freq: Once | INTRAMUSCULAR | Status: AC
  Administered 2020-10-28: 40 mg via INTRAVENOUS
  Filled 2020-10-28: qty 4

## 2020-10-28 MED ORDER — DUTASTERIDE 0.5 MG PO CAPS
0.5000 mg | ORAL_CAPSULE | Freq: Every day | ORAL | Status: DC
  Administered 2020-10-28 – 2020-11-05 (×8): 0.5 mg via ORAL
  Filled 2020-10-28 (×9): qty 1

## 2020-10-28 MED ORDER — POTASSIUM CHLORIDE CRYS ER 20 MEQ PO TBCR
20.0000 meq | EXTENDED_RELEASE_TABLET | Freq: Once | ORAL | Status: AC
  Administered 2020-10-28: 20 meq via ORAL
  Filled 2020-10-28: qty 1

## 2020-10-28 MED ORDER — VITAMIN B-12 1000 MCG PO TABS
500.0000 ug | ORAL_TABLET | Freq: Every day | ORAL | Status: DC
  Administered 2020-10-28 – 2020-11-05 (×8): 500 ug via ORAL
  Filled 2020-10-28 (×8): qty 1

## 2020-10-28 MED ORDER — POLYETHYLENE GLYCOL 3350 17 G PO PACK: 17.0000 g | PACK | Freq: Every day | ORAL | Status: DC | PRN

## 2020-10-28 MED ORDER — SODIUM CHLORIDE 0.9% FLUSH: 3.0000 mL | INTRAVENOUS | Status: DC | PRN

## 2020-10-28 MED ORDER — HEPARIN SODIUM (PORCINE) 5000 UNIT/ML IJ SOLN
5000.0000 [IU] | Freq: Three times a day (TID) | INTRAMUSCULAR | Status: DC
Start: 2020-10-28 — End: 2020-10-28

## 2020-10-28 MED ORDER — SODIUM CHLORIDE 0.9 % IV SOLN: 250.0000 mL | INTRAVENOUS | Status: DC | PRN

## 2020-10-28 MED ORDER — MONTELUKAST SODIUM 10 MG PO TABS
10.0000 mg | ORAL_TABLET | Freq: Every day | ORAL | Status: DC
  Administered 2020-10-28 – 2020-11-04 (×8): 10 mg via ORAL
  Filled 2020-10-28 (×8): qty 1

## 2020-10-28 MED ORDER — IPRATROPIUM-ALBUTEROL 0.5-2.5 (3) MG/3ML IN SOLN
3.0000 mL | Freq: Four times a day (QID) | RESPIRATORY_TRACT | Status: DC
  Administered 2020-10-28 – 2020-10-30 (×8): 3 mL via RESPIRATORY_TRACT
  Filled 2020-10-28 (×8): qty 3

## 2020-10-28 MED ORDER — ALBUTEROL SULFATE (2.5 MG/3ML) 0.083% IN NEBU: 2.5000 mg | INHALATION_SOLUTION | RESPIRATORY_TRACT | Status: DC | PRN

## 2020-10-28 MED ORDER — DICLOFENAC SODIUM 1 % EX GEL
1.0000 "application " | Freq: Three times a day (TID) | CUTANEOUS | Status: DC | PRN
  Administered 2020-10-30 – 2020-11-04 (×5): 1 via TOPICAL
  Filled 2020-10-28 (×3): qty 100

## 2020-10-28 MED ORDER — SODIUM CHLORIDE 0.9% FLUSH
3.0000 mL | Freq: Two times a day (BID) | INTRAVENOUS | Status: DC
  Administered 2020-10-28 – 2020-10-31 (×4): 3 mL via INTRAVENOUS

## 2020-10-28 NOTE — ED Notes (Signed)
Pt had on NRB on arrival with sp02 100%... pt placed on 4L [place] per basline and sp02 97%

## 2020-10-28 NOTE — ED Provider Notes (Signed)
I provided a substantive portion of the care of this patient.  I personally performed the entirety of the medical decision making for this encounter.  EKG Interpretation  Date/Time:  Friday October 28 2020 09:36:54 EDT Ventricular Rate:  84 PR Interval:    QRS Duration: 142 QT Interval:  391 QTC Calculation: 463 R Axis:   87 Text Interpretation: Atrial fibrillation Ventricular premature complex Right bundle branch block Confirmed by Lorre Nick ([id number]) on 10/28/2020 10:77:28 AM  87 year old male presents with increased dyspnea as well as lower extremity edema.  Has evidence of CHF on chest x-ray.  Will be admitted to the hospital   Lorre Nick, MD 10/28/20 1037

## 2020-10-28 NOTE — H&P (Addendum)
Cardiology Admission History and Physical:   Patient ID: Ruben [name] MRN: [id number]; DOB: 1927-05-17   Admission date: 10/28/2020  PCP:  Ruben [name]  Cardiologist:  [name] [facility]: Dr. Burt [name]   Chief Complaint:  Shortness of breath   Patient Profile:   Ruben [name] is a 87 y.o. male with RBBB, severe mitral regurgitation, HTN, COPDon home 02, longstanding persistent atrial fibrillation on warfarin, degenerative arthritis, severe aortic stenosiswith plans for TAVR, and recent mechanical fall with hip fracture s/p ORIFand admission for acute on chronic diastolic CHF who presents to [facility] ER with recurrent heart failure.   History of Present Illness:   The patient is well known to our structural heart team. He has known severe aortic stenosis and was referred to [place] for TAVR work up. Upon initial consultation he was a functionally independent and remarkably active for a gentleman for his age.  The patient recently relocated to [place] to live near his son, Ruben [name] ( a [employer]). In12/2021 he was hospitalized with acute hypoxic respiratory failure felt related to a combination of COPD with acute exacerbation, rhinovirus infection, and acute on chronic diastolic congestive heart failure. Symptoms improved with antibiotics, diuresis, and nebulized bronchodilators. Transthoracic echocardiogram performed at that time revealed EF 55%, mean grad 37 mmHg, peak grad 63.4 mmHg, AVA 0.87 cm2, DVI 0.18, as well as severe MAC with mild MS/MR. He was ultimately discharged home on home oxygen therapy.   He was referred to the multidisciplinary heart valve clinic and was evaluated by Dr. Burt [name]. Diagnostic cardiac catheterization 08/24/20 revealed diffuse calcific nonobstructive coronary artery disease with severe aortic stenosis and mild pulmonary hypertension. There was some question about splay artifact and severe MR  and TEE was recommended. TEE 09/07/20 confirmed severe AS and revealed severe mitral regurgitation with a small flail segment of the middle scallop of the posterior leaflet and an eccentric jet of mitral regurgitation with flow reversal in the pulmonary veins. There was severe mitral annular calcification and functional anatomy of the valve did not appear amenable to transcatheter edge-to-edge repair.   The patient has been extensively evaluated by the multidisciplinary valve team and felt to have severe, symptomatic aortic stenosis and to be a suitable candidate for TAVR, which was originally set up for 09/20/20. On his way to the hospital for his valve surgery on 2/15, the patient suffered a mechanical fall while trying to get into the car that resulted in a hip fracture. He underwent successful ORIF by Dr. Percell [name] on 09/21/20. He was discharged to [name] for short term rehabilitation. I saw him in the office on 10/12/20 for follow up. He was noted to be in acute heart failure with 20 lb weight gain (up to 212lbs with baseline weight of 190lbs) and making slow progress with hip rehabilitation. Outpatient diuresis was attempted but he was ultimately admitted from 3/10-3/14/22 for IV diuresis, discharge weight 201.7 lbs. I saw him in the office on 3/17 after discharge and his volume status improved on Torsemide 63m daily (previously 20 mg daily). I increased his Torsemide to 684mx 2 days to improve his volume status.  I saw him for close follow up on 3/23. He remained volume overloaded but weight slowly decreasing (down to 199 lbs). He was overall doing much better with less shortness of breath and improving functional status. He was able to ambulate with his walker. He and his son  were eager to proceed with TAVR and this was set up for next Tuesday 3/29. I did increase Torsemide to 60 mg daily x 2 days, however, pt developed worsening shortness of breath and presented to the [facility] ED this AM for  evaluation. In the ER CXR showed CHF, BNP 498.5, creat 0.96, K 3.3, Hg 10.2. He was treated with Lasix 69m x1 and KDur 232m. Cardiology asked for admission.   The patient is sitting up in bed. Son at bedside. Since I saw him Wednesday when he denied shortness of breath, he has developed worsening LE edema, abdominal distension, orthopnea and PND. Chronic 02 has had to be titrated from 2L--> 5L. No chest pain. No dizziness or syncope.   Past Medical History:  Diagnosis Date  . Arthritis   . CHF (congestive heart failure) ([facility]  . COPD (chronic obstructive pulmonary disease) ([facility]  . Mitral regurgitation   . Persistent atrial fibrillation (HCC)    on coumadin   . RBBB   . Severe aortic stenosis     Past Surgical History:  Procedure Laterality Date  . APPENDECTOMY    . BUBBLE STUDY  09/07/2020   Procedure: BUBBLE STUDY;  Surgeon: Ruben [name];  [facility] Service: Cardiovascular;;  . CARDIAC CATHETERIZATION    . CLAVICLE SURGERY    . INTRAMEDULLARY (IM) NAIL INTERTROCHANTERIC Left 09/20/2020   Procedure: INTRAMEDULLARY (IM) NAIL INTERTROCHANTRIC;  Surgeon: Ruben [name];  [facility] Service: Orthopedics;  Laterality: Left;  . RIGHT/LEFT HEART CATH AND CORONARY ANGIOGRAPHY N/A 08/24/2020   Procedure: RIGHT/LEFT HEART CATH AND CORONARY ANGIOGRAPHY;  Surgeon: CoSherren MochaMD;  [facility];  Service: Cardiovascular;  Laterality: N/A;  . TEE WITHOUT CARDIOVERSION N/A 09/07/2020   Procedure: TRANSESOPHAGEAL ECHOCARDIOGRAM (TEE);  Surgeon: Ruben [name];  [place] [facility] Service: Cardiovascular;  Laterality: N/A;  . TOTAL KNEE ARTHROPLASTY Bilateral      Medications Prior to Admission: Prior to Admission medications   Medication Sig Start Date End Date Taking? Authorizing Provider  acetaminophen (TYLENOL) 500 MG tablet Take 1,000 mg by mouth in the morning, at noon, and at bedtime.    [provider]   diclofenac Sodium (VOLTAREN) 1 % GEL Apply 1 application topically 3 (three) times daily as needed (to both knees for arthritis pain).    [provider]  dutasteride (AVODART) 0.5 MG capsule Take 0.5 mg by mouth in the morning and at bedtime. 05/21/20   [provider]  Ferrous Gluconate 324 (37.5 Fe) MG TABS Take 324 mg by mouth every other day.    [provider]  ipratropium-albuterol (DUONEB) 0.5-2.5 (3) MG/3ML SOLN Take 3 mLs by nebulization in the morning, at noon, and at bedtime.    [provider]  levalbuterol (XOPENEX HFA) 45 MCG/ACT inhaler Inhale 1 puff into the lungs every 4 (four) hours as needed for wheezing or shortness of breath.    [provider]  levalbuterol (XPenne Lash0.63 MG/3ML nebulizer solution Take 0.63 mg by nebulization 2 (two) times daily.    [provider]  Lidocaine (ASPERCREME LIDOCAINE) 4 % PTCH Place 1 patch onto the skin in the morning and at bedtime. (0800 & 2000)    [provider]  metoprolol succinate (TOPROL-XL) 50 MG 24 hr tablet Take 50 mg by mouth daily. 04/23/20   [provider]  montelukast (SINGULAIR) 10 MG tablet Take 10 mg by mouth at bedtime. 04/19/20  [provider]  polyethylene glycol (MIRALAX / GLYCOLAX) 17 g packet Take 17 g by mouth daily as needed for mild constipation. 07/19/20   Bonnielee Haff, MD  polyvinyl alcohol (LIQUIFILM TEARS) 1.4 % ophthalmic solution Place 1 drop into both eyes 2 (two) times daily as needed for dry eyes.    [provider]  potassium chloride SA (KLOR-CON) 20 MEQ tablet Take 1 tablet (20 mEq total) by mouth daily. 10/12/20   Eileen Stanford, PA-C  senna-docusate (SENOKOT-S) 8.6-50 MG tablet Take 2 tablets by mouth daily.    [provider]  SPIRIVA RESPIMAT 2.5 MCG/ACT AERS Inhale 2 puffs into the lungs daily. 10/18/20   [provider]  tamsulosin (FLOMAX) 0.4 MG CAPS capsule Take 0.8 mg by mouth at  bedtime. 05/21/20   [provider]  torsemide (DEMADEX) 20 MG tablet Take 40 mg by mouth daily. 10/17/20   [provider]  vitamin B-12 (CYANOCOBALAMIN) 500 MCG tablet Take 500 mcg by mouth daily.    [provider]  warfarin (COUMADIN) 1 MG tablet Take 1 mg by mouth daily.    [provider]  warfarin (COUMADIN) 2.5 MG tablet Take 2.5 mg by mouth daily.    [provider]     Allergies:   No Known Allergies  Social History:   Social History   Socioeconomic History  . Marital status: Married    Spouse name: Not on file  . Number of children: Not on file  . Years of education: Not on file  . Highest education level: Not on file  Occupational History  . Occupation: retired  Tobacco Use  . Smoking status: Former Smoker    Packs/day: 1.00    Years: 30.00    Pack years: 30.00    Quit date: 1975    Years since quitting: 47.2  . Smokeless tobacco: Never Used  Vaping Use  . Vaping Use: Never used  Substance and Sexual Activity  . Alcohol use: Not Currently  . Drug use: Never  . Sexual activity: Not on file  Other Topics Concern  . Not on file  Social History Narrative  . Not on file   Social Determinants of Health   Financial Resource Strain: Not on file  Food Insecurity: No Food Insecurity  . Worried About Charity fundraiser in the Last Year: Never true  . Ran Out of Food in the Last Year: Never true  Transportation Needs: No Transportation Needs  . Lack of Transportation (Medical): No  . Lack of Transportation (Non-Medical): No  Physical Activity: Not on file  Stress: Not on file  Social Connections: Not on file  Intimate Partner Violence: Not on file    Family History:   The patient's family history is not on file.    ROS:  Please see the history of present illness.  All other ROS reviewed and negative.     Physical Exam/Data:   Vitals:   10/28/20 0945 10/28/20 1000 10/28/20 1015 10/28/20 1045  BP: 133/82 114/73  107/68 119/74  Pulse: 76 (!) 30 73 69  Resp: (!) _0 Temp:      TempSrc:      SpO2: 92% 94% 95% 93%   No intake or output data in the 24 hours ending 10/28/20 1120 Last 3 Weights 10/26/2020 10/20/2020 10/17/2020  Weight (lbs) 199 lb 202 lb 201 lb 12.8 oz  Weight (kg) 90.266 kg 91.627 kg 91.536 kg     There  is no height or weight on file to calculate BMI.  General:  chronically ill appearing. Lethargic   HEENT: normal Lymph: no adenopathy Neck: elevated JVD Endocrine:  No thryomegaly Cardiac: Ireg Ireg. 3/6 SEM @ RUSB and 2/6 holosystolicmurmur. Lungs: decreased breath sounds with crackles Abd: mildly distended, taught Ext: 2+ bilateral LE edema, legs wrapped.  Musculoskeletal:  No deformities, BUE and BLE strength normal and equal Skin: warm and dry  Neuro:  CNs 2-12 intact, no focal abnormalities noted Psych:  Normal affect    EKG:  The ECG that was done was personally reviewed and demonstrates afib with RBBB. Controlled VR  Relevant CV Studies:  ECHOCARDIOGRAM REPORT    Patient Name:  Ruben [name] Date of Exam: 07/16/2020  Medical Rec #: [id number] Height:    67.0 in  Accession #:  [id number] Weight:    179.2 lb  Date of Birth: 01/04/1927  BSA:     1.930 m  Patient Age:  18 years  BP:      94/56 mmHg  Patient Gender: M     HR:      71 bpm.  Exam Location: Inpatient   Procedure: 2D Echo, Cardiac Doppler and Color Doppler   Indications:  I50.33 Acute on chronic diastolic (congestive) heart  failure    History:    Patient has no prior history of Echocardiogram  examinations.         COPD; Arrythmias:Atrial Fibrillation.    Sonographer:  Jonelle Sidle Dance  Referring Phys: [place]    1. Left ventricular ejection fraction, by estimation, is 55 to 60%. The  left ventricle has normal function. The left ventricle has no regional  wall motion abnormalities. There is moderate left  ventricular hypertrophy.  Left ventricular diastolic  parameters are indeterminate.  2. Right ventricular systolic function is moderately reduced. The right  ventricular size is mildly enlarged. There is mildly elevated pulmonary  artery systolic pressure.  3. Left atrial size was moderately dilated.  4. Mild functional mitral stenosis due to severe MAC. The mitral valve is  degenerative. Mild mitral valve regurgitation. Mild mitral stenosis.  Severe mitral annular calcification.  5. Tricuspid valve regurgitation is moderate.  6. The aortic valve is tricuspid. There is severe calcifcation of the  aortic valve. There is severe thickening of the aortic valve. Aortic valve  regurgitation is not visualized. Severe aortic valve stenosis.  7. The inferior vena cava is normal in size with greater than 50%  respiratory variability, suggesting right atrial pressure of 3 mmHg.   FINDINGS  Left Ventricle: Left ventricular ejection fraction, by estimation, is 55  to 60%. The left ventricle has normal function. The left ventricle has no  regional wall motion abnormalities. The left ventricular internal cavity  size was normal in size. There is  moderate left ventricular hypertrophy. Left ventricular diastolic  parameters are indeterminate.   Right Ventricle: The right ventricular size is mildly enlarged. No  increase in right ventricular wall thickness. Right ventricular systolic  function is moderately reduced. There is mildly elevated pulmonary artery  systolic pressure. The tricuspid  regurgitant velocity is 3.08 m/s, and with an assumed right atrial  pressure of 3 mmHg, the estimated right ventricular systolic pressure is  09.3 mmHg.   Left Atrium: Left atrial size was moderately dilated.   Right Atrium: Right atrial size was normal in size.   Pericardium: There is no evidence of pericardial effusion.   Mitral Valve: Mild functional mitral stenosis due  to severe MAC. The   mitral valve is degenerative in appearance. There is moderate thickening  of the mitral valve leaflet(s). There is moderate calcification of the  mitral valve leaflet(s). Severe mitral  annular calcification. Mild mitral valve regurgitation. Mild mitral valve  stenosis. MV peak gradient, 14.2 mmHg. The mean mitral valve gradient is  4.5 mmHg.   Tricuspid Valve: The tricuspid valve is normal in structure. Tricuspid  valve regurgitation is moderate . No evidence of tricuspid stenosis.   Aortic Valve: The aortic valve is tricuspid. There is severe calcifcation  of the aortic valve. There is severe thickening of the aortic valve. There  is severe aortic valve annular calcification. Aortic valve regurgitation  is not visualized. Severe aortic  stenosis is present. Aortic valve mean gradient measures 37.0 mmHg.  Aortic valve peak gradient measures 63.4 mmHg. Aortic valve area, by VTI  measures 0.83 cm.   Pulmonic Valve: The pulmonic valve was normal in structure. Pulmonic valve  regurgitation is mild. No evidence of pulmonic stenosis.   Aorta: The aortic root is normal in size and structure.   Venous: The inferior vena cava is normal in size with greater than 50%  respiratory variability, suggesting right atrial pressure of 3 mmHg.   IAS/Shunts: No atrial level shunt detected by color flow Doppler.     LEFT VENTRICLE  PLAX 2D  LVIDd:     5.10 cm  LVIDs:     3.10 cm  LV PW:     1.40 cm  LV IVS:    1.40 cm  LVOT diam:   2.40 cm  LV SV:     82  LV SV Index:  42  LVOT Area:   4.52 cm     RIGHT VENTRICLE      IVC  RV Basal diam: 3.50 cm  IVC diam: 1.60 cm  RV Mid diam:  3.00 cm  RV S prime:   8.59 cm/s  TAPSE (M-mode): 1.3 cm   LEFT ATRIUM       Index    RIGHT ATRIUM      Index  LA diam:    5.60 cm 2.90 cm/m  RA Area:   35.60 cm  LA Vol (A2C):  252.0 ml 130.56 ml/m RA Volume:  135.00 ml 69.94 ml/m  LA  Vol (A4C):  185.0 ml 95.85 ml/m  LA Biplane Vol: 223.0 ml 115.53 ml/m  AORTIC VALVE  AV Area (Vmax):  0.87 cm  AV Area (Vmean):  0.84 cm  AV Area (VTI):   0.83 cm  AV Vmax:      398.25 cm/s  AV Vmean:     284.750 cm/s  AV VTI:      0.980 m  AV Peak Grad:   63.4 mmHg  AV Mean Grad:   37.0 mmHg  LVOT Vmax:     76.75 cm/s  LVOT Vmean:    52.700 cm/s  LVOT VTI:     0.180 m  LVOT/AV VTI ratio: 0.18    AORTA  Ao Root diam: 3.60 cm  Ao Asc diam: 3.50 cm   MITRAL VALVE        TRICUSPID VALVE  MV Area (PHT): 1.67 cm   TR Peak grad:  37.9 mmHg  MV Peak grad: 14.2 mmHg  TR Vmax:    308.00 cm/s  MV Mean grad: 4.5 mmHg  MV Vmax:    1.88 m/s   SHUNTS  MV Vmean:   89.2 cm/s  Systemic VTI: 0.18 m  MV  Decel Time: 454 msec   Systemic Diam: 2.40 cm  MV E velocity: 181.00 cm/s  MV A velocity: 58.20 cm/s  MV E/A ratio: 3.11   Ruben Rouge MD  Electronically signed by Ruben Rouge MD  Signature Date/Time: 07/16/2020/3:21:03 PM        RIGHT/LEFT HEART CATH AND CORONARY ANGIOGRAPHY    Conclusion    Prox RCA lesion is 40% stenosed.  RPDA lesion is 60% stenosed.  1st RPL lesion is 50% stenosed.  Mid LM to Dist LM lesion is 30% stenosed.  Prox LAD to Mid LAD lesion is 50% stenosed.  1st Diag lesion is 50% stenosed.  1. Diffuse calcific nonobstructive CAD 2. Severe calcific aortic stenosis with mean gradient 37 mmHg, aortic valve area 1.05 cm 3. Mild pulmonary hypertension  Recommendation: Continue multidisciplinary team evaluation for treatment of severe symptomatic aortic stenosis   Surgeon Notes    09/07/2020 12:10 PM CV Procedure signed by Geralynn Rile, MD    Indications  Severe aortic stenosis [I35.0 (ICD-10-CM)]   Procedural Details  Technical Details INDICATION: Severe, symptomatic aortic stenosis  PROCEDURAL DETAILS: There was an indwelling IV in  a right antecubital vein. Using normal sterile technique, the IV was changed out for a 5 Fr brachial sheath over a 0.018 inch wire. The right wrist was then prepped, draped, and anesthetized with 1% lidocaine. Ultrasound guidance is used for right radial artery access. Using the modified Seldinger technique a 5/6 French Slender sheath was placed in the right radial artery. Ultrasound images are digitally captured and stored in the patient's chart. Intra-arterial verapamil was administered through the radial artery sheath. IV heparin was administered after a JR4 catheter was advanced into the central aorta. A Swan-Ganz catheter was used for the right heart catheterization. Standard protocol was followed for recording of right heart pressures and sampling of oxygen saturations. Fick cardiac output was calculated. Standard Judkins catheters were used for selective coronary angiography. LV pressure is recorded and an aortic valve pullback is performed. An AL two catheter was used to direct a straight tip wire across the aortic valve and pullback pressures were recorded. There were no immediate procedural complications. The patient was transferred to the post catheterization recovery area for further monitoring.    Estimated blood loss <50 mL.   During this procedure medications were administered to achieve and maintain moderate conscious sedation while the patient's heart rate, blood pressure, and oxygen saturation were continuously monitored and I was present face-to-face 100% of this time.   Medications (Filter: Administrations occurring from 1048 to 1154 on 08/24/20) (important) Continuous medications are totaled by the amount administered until 08/24/20 1154.    midazolam (VERSED) injection (mg) Total dose: 1 mg  Date/Time Rate/Dose/Volume Action   08/24/20 1107 1 mg Given    lidocaine (PF) (XYLOCAINE) 1 % injection (mL) Total volume: 4 mL  Date/Time Rate/Dose/Volume Action    08/24/20 1111 2 mL Given   1113 2 mL Given    Radial Cocktail/Verapamil only (mL) Total volume: 10 mL  Date/Time Rate/Dose/Volume Action   08/24/20 1117 10 mL Given    heparin sodium (porcine) injection (Units) Total dose: 5,000 Units  Date/Time Rate/Dose/Volume Action   08/24/20 1128 5,000 Units Given    Heparin (Porcine) in NaCl 1000-0.9 UT/500ML-% SOLN (mL) Total volume: 1,000 mL  Date/Time Rate/Dose/Volume Action   08/24/20 1146 500 mL Given   1146 500 mL Given    iohexol (OMNIPAQUE) 350 MG/ML injection (mL) Total volume: 55 mL  Date/Time Rate/Dose/Volume  Action   08/24/20 1153 55 mL Given    Sedation Time  Sedation Time Physician-1: 40 minutes 44 seconds   Contrast  Medication Name Total Dose  iohexol (OMNIPAQUE) 350 MG/ML injection 55 mL    Radiation/Fluoro  Fluoro time: 7.6 (min) DAP: 21229 (mGycm2) Cumulative Air Kerma: 336 (mGy)   Coronary Findings   Diagnostic Dominance: Right  Left Main  Mid LM to Dist LM lesion is 30% stenosed. The lesion is calcified.  Left Anterior Descending  Prox LAD to Mid LAD lesion is 50% stenosed. The lesion is moderately calcified.  First Diagonal Branch  1st Diag lesion is 50% stenosed.  Left Circumflex  There is mild diffuse disease throughout the vessel.  Right Coronary Artery  Vessel is large.  Prox RCA lesion is 40% stenosed. The lesion is moderately calcified.  Right Posterior Descending Artery  RPDA lesion is 60% stenosed. The lesion is calcified.  First Right Posterolateral Branch  1st RPL lesion is 50% stenosed.   Intervention   No interventions have been documented.  Left Heart  Mitral Valve The annulus is calcified.  Aortic Valve There is severe aortic valve stenosis. The aortic valve is calcified. There is restricted aortic valve motion. Severe aortic stenosis is present with peak to peak gradient 47 mmHg, mean gradient 37 mmHg, Peak instantaneous  gradient 53 mmHg, calculated aortic valve area 1.05 cm   Coronary Diagrams   Diagnostic Dominance: Right    Intervention    Implants  No implant documentation for this case.    Syngo Images  Show images for CARDIAC CATHETERIZATION  Images on Long Term Storage  Show images for Jud, [place] to Procedure Log  Procedure Log     Hemo Data  Flowsheet Row Most Recent Value  Fick Cardiac Output 5.19 L/min  Fick Cardiac Output Index 2.67 (L/min)/BSA  Aortic Mean Gradient 36.89 mmHg  Aortic Peak Gradient 47 mmHg  Aortic Valve Area 1.05  Aortic Value Area Index 0.54 cm2/BSA  RA A Wave 9 mmHg  RA V Wave 10 mmHg  RA Mean 8 mmHg  RV Systolic Pressure 48 mmHg  RV Diastolic Pressure 4 mmHg  RV EDP 8 mmHg  PA Systolic Pressure 54 mmHg  PA Diastolic Pressure 16 mmHg  PA Mean 32 mmHg  PW A Wave 23 mmHg  PW V Wave 29 mmHg  PW Mean 22 mmHg  AO Systolic Pressure 97 mmHg  AO Diastolic Pressure 64 mmHg  AO Mean 78 mmHg  LV Systolic Pressure 585 mmHg  LV Diastolic Pressure 7 mmHg  LV EDP 16 mmHg  AOp Systolic Pressure 929 mmHg  AOp Diastolic Pressure 59 mmHg  AOp Mean Pressure 77 mmHg  LVp Systolic Pressure 244 mmHg  LVp Diastolic Pressure 4 mmHg  LVp EDP Pressure 13 mmHg  QP/QS 1  TPVR Index 12 HRUI  TSVR Index 29.24 HRUI  PVR SVR Ratio 0.14  TPVR/TSVR Ratio 0.41        TRANSESOPHOGEAL ECHO REPORT       Patient Name:  Ruben [name] Date of Exam: 09/07/2020  Medical Rec #: [id number] Height:    66.0 in  Accession #:  [id number] Weight:    186.5 lb  Date of Birth: 27-Jun-1927  BSA:     1.941 m  Patient Age:  71 years  BP:      132/79 mmHg  Patient Gender: M     HR:      82 bpm.  Exam Location: Outpatient   Procedure: 3D  Echo, Transesophageal Echo, Cardiac Doppler, Color Doppler  and       Saline Contrast Bubble Study   Indications:   I35.0 Nonrheumatic aortic (valve) stenosis     History:     Patient has prior history of Echocardiogram examinations,  most          recent 07/16/2020. CHF, COPD, Aortic Valve Disease;          Arrythmias:Atrial Fibrillation and RBBB.    Sonographer:   Tiffany Dance  Referring Phys: [id number] Eileen Stanford  Diagnosing Phys: Ruben Chiquito MD   PROCEDURE: After discussion of the risks and benefits of a TEE, an  informed consent was obtained from the patient. TEE procedure time was 40  minutes. The transesophogeal probe was passed without difficulty through  the esophogus of the patient. Local  oropharyngeal anesthetic was provided with Cetacaine. Sedation performed  by different physician. The patient was monitored while under deep  sedation. Anesthestetic sedation was provided intravenously by  Anesthesiology: 328m of Propofol, 327mof  Lidocaine. Image quality was excellent. The patient's vital signs;  including heart rate, blood pressure, and oxygen saturation; remained  stable throughout the procedure. The patient developed no complications  during the procedure.   IMPRESSIONS    1. The MV is degenerative with a small flail P2 segment. 2D VC 0.34 cm,  2D ERO 0.27 cm2 (79 degrees), R vol 28 cc. 3D VCA 0.13 cm2. The flail gap  is 0.21 cm and flail width 0.52 cm. The jet is eccentric and there is  systolic pulmonary flow reversal in  the left upper pulmonary vein, but due to the small flail segment, this is  not likely hemodynamically significant. There is mild mitral stenosis with  3D MVA 2.3 cm2. MG 3.8 mmHG @ 78 bpm. The mitral valve is degenerative.  Mild to moderate mitral valve  regurgitation. Mild mitral stenosis. The mean mitral valve gradient is 3.8  mmHg with average heart rate of 75 bpm. Severe mitral annular  calcification.  2. The bubble study was positive after 6 cardiac cycles, indicating there  is an intrapulmonary shunt. There is no interatrial shunt. Agitated saline   contrast bubble study was positive with shunting observed after >6 cardiac  cycles suggestive of  intrapulmonary shunting.  3. The aortic valve is tricuspid. There is severe calcifcation of the  aortic valve. There is severe thickening of the aortic valve. Aortic valve  regurgitation is mild. Severe aortic valve stenosis. Aortic valve area, by  VTI measures 0.52 cm. Aortic  valve Vmax measures 3.94 m/s.  4. Left ventricular ejection fraction, by estimation, is 50 to 55%. The  left ventricle has low normal function. The left ventricle has no regional  wall motion abnormalities.  5. Right ventricular systolic function is mildly reduced. The right  ventricular size is moderately enlarged.  6. Left atrial size was severely dilated. No left atrial/left atrial  appendage thrombus was detected. The LAA emptying velocity was 32 cm/s.  7. Right atrial size was severely dilated.  8. The tricuspid valve is abnormal. Tricuspid valve regurgitation is  moderate to severe.  9. Pulmonic valve regurgitation is moderate.  10. Severely dilated pulmonary artery.   FINDINGS  Left Ventricle: Left ventricular ejection fraction, by estimation, is 50  to 55%. The left ventricle has low normal function. The left ventricle has  no regional wall motion abnormalities. The left ventricular internal  cavity size was normal in size.   Right Ventricle: The right ventricular size  is moderately enlarged. No  increase in right ventricular wall thickness. Right ventricular systolic  function is mildly reduced.   Left Atrium: Left atrial size was severely dilated. No left atrial/left  atrial appendage thrombus was detected. The LAA emptying velocity was 32  cm/s.   Right Atrium: Right atrial size was severely dilated.   Pericardium: There is no evidence of pericardial effusion.   Mitral Valve: The MV is degenerative with a small flail P2 segment. 2D VC  0.34 cm, 2D ERO 0.27 cm2 (79 degrees), R vol 28  cc. 3D VCA 0.13 cm2. The  flail gap is 0.21 cm and flail width 0.52 cm. The jet is eccentric and  there is systolic pulmonary flow  reversal in the left upper pulmonary vein, but due to the small flail  segment, this is not likely hemodynamically significant. There is mild  mitral stenosis with 3D MVA 2.3 cm2. MG 3.8 mmHG @ 78 bpm. The mitral  valve is degenerative in appearance. Severe  mitral annular calcification. Mild to moderate mitral valve  regurgitation. Mild mitral valve stenosis. The mean mitral valve gradient  is 3.8 mmHg with average heart rate of 75 bpm.   Tricuspid Valve: The tricuspid valve is abnormal. Tricuspid valve  regurgitation is moderate to severe. No evidence of tricuspid stenosis.   Aortic Valve: The aortic valve is tricuspid. There is severe calcifcation  of the aortic valve. There is severe thickening of the aortic valve.  Aortic valve regurgitation is mild. Severe aortic stenosis is present.  Aortic valve mean gradient measures 38.1  mmHg. Aortic valve peak gradient measures 62.1 mmHg. Aortic valve area,  by VTI measures 0.52 cm.   Pulmonic Valve: The pulmonic valve was grossly normal. Pulmonic valve  regurgitation is moderate. No evidence of pulmonic stenosis.   Aorta: The aortic root and ascending aorta are structurally normal, with  no evidence of dilitation.   Pulmonary Artery: The pulmonary artery is severely dilated.   Venous: A pattern of systolic flow reversal, suggestive of severe mitral  regurgitation is recorded from the left upper pulmonary vein.   IAS/Shunts: The atrial septum is grossly normal. Agitated saline contrast  was given intravenously to evaluate for intracardiac shunting. Agitated  saline contrast bubble study was positive with shunting observed after >6  cardiac cycles suggestive of  intrapulmonary shunting. The bubble study was positive after 6 cardiac  cycles, indicating there is an intrapulmonary shunt. There is no   interatrial shunt.     LEFT VENTRICLE  PLAX 2D  LVOT diam:   2.20 cm  LV SV:     53  LV SV Index:  27  LVOT Area:   3.80 cm     AORTIC VALVE  AV Area (Vmax):  0.61 cm  AV Area (Vmean):  0.59 cm  AV Area (VTI):   0.52 cm  AV Vmax:      394.08 cm/s  AV Vmean:     294.660 cm/s  AV VTI:      1.032 m  AV Peak Grad:   62.1 mmHg  AV Mean Grad:   38.1 mmHg  LVOT Vmax:     62.82 cm/s  LVOT Vmean:    46.070 cm/s  LVOT VTI:     0.140 m  LVOT/AV VTI ratio: 0.14    AORTA  Ao Root diam: 2.60 cm  Ao Asc diam: 3.06 cm   MITRAL VALVE         TRICUSPID VALVE  MV Area VTI: 1.45 cm  TR Peak grad:  44.9 mmHg  MV Mean grad: 3.8 mmHg    TR Vmax:    335.00 cm/s  MV VTI:    0.37 m  MR Peak grad:  46.2 mmHg  SHUNTS  MR Vmax:     339.91 cm/s Systemic VTI: 0.14 m  MR PISA:     9.05 cm  Systemic Diam: 2.20 cm  MR PISA Eff ROA: 103 mm  MR PISA Radius: 1.20 cm   Ruben Chiquito MD  Electronically signed by Ruben Chiquito MD  Signature Date/Time: 09/07/2020/9:20:02 PM     Cardiac TAVR CT  TECHNIQUE: The patient was scanned on a [employer]. A 100 kV retrospective scan was triggered in the descending thoracic aorta at 111 HU's. Gantry rotation speed was 250 msecs and collimation was .6 mm. No beta blockade or nitro were given. The 3D data set was reconstructed in 5% intervals of the R-R cycle. Systolic and diastolic phases were analyzed on a dedicated work station using MPR, MIP and VRT modes. The patient received 80 cc of contrast.  FINDINGS: Image quality: Average. The entire left ventricle was not imaged.  Noise artifact is: Moderate cardiac motion artifact. Millisecond reconstruction performed.  Valve Morphology: The aortic valve is tricuspid with diffuse severe calcifications. The leaflets are severe restricted in systole consistent with severe aortic  stenosis.  Aortic Valve Calcium score: 4577  Aortic annular dimension:  Phase assessed: 200 ms  Annular area: 448 mm2  Annular perimeter: 75.9 mm  Max diameter: 26.8 mm  Min diameter: 21.9 mm  Annular and subannular calcification: No annular calcification. There is minimal subannular calcification under the [place].  Optimal coplanar projection: LAO 27 CAU 4  Coronary Artery Height above Annulus:  Left Main: 12.6 mm  Right Coronary: 17.0 mm  Sinus of Valsalva Measurements:  Non-coronary: 30 mm  Right-coronary: 31 mm  Left-coronary: 31 mm  Sinus of Valsalva Height:  Non-coronary: 23.0 mm  Right-coronary: 21.3 mm  Left-coronary: 19.8 mm  Sinotubular Junction: 28 mm  Ascending Thoracic Aorta: 34 mm  Coronary Arteries: Normal coronary origin. Right dominance. The study was performed without use of NTG and is insufficient for plaque evaluation. Please refer to recent cardiac catheterization for coronary assessment. 3-vessel calcifications noted.  Cardiac Morphology:  Right Atrium: Right atrial size is dilated.  Right Ventricle: The right ventricular cavity is dilated.  Left Atrium: Left atrial size is dilated in size. There is contrast mixing artifact in the LAA.  Left Ventricle: The ventricular cavity size is within normal limits and there are no stigmata of prior infarction in the visualized portion.  Pulmonary arteries: Dilated in size without proximal filling defect. Findings suggestive of pulmonary hypertension.  Pulmonary veins: Normal pulmonary venous drainage.  Pericardium: Normal thickness with no significant effusion or calcium present.  Mitral Valve: The mitral valve is degenerative with severe mitral annular calcification.  Extra-cardiac findings: See attached radiology report for non-cardiac structures.  IMPRESSION: 1. Trileaflet aortic valve with severe aortic stenosis (calcium score 4577).  2.  Cardiac motion artifact present and annular measurements performed using 200 ms reconstruction.  3. Annular measurements favorable for 26 mm Edwards Sapien 3 TAVR (448 mm2).  4. No significant annular calcification. Minimal subannular calcification under the [place].  5. Sufficient coronary to annulus distance.  6. Optimal Fluoroscopic Angle for Delivery: LAO 27 CAU 4  7. Dilated pulmonary artery suggestive of pulmonary hypertension.  8. Contrast mixing artifact is present in the LAA.  9. Severe mitral annular calcification.  [place] Bells T. Audie Box, MD   Electronically Signed By: Ruben [name] On: 08/30/2020 18:12    CT ANGIOGRAPHY CHEST, ABDOMEN AND PELVIS  TECHNIQUE: Non-contrast CT of the chest was initially obtained.  Multidetector CT imaging through the chest, abdomen and pelvis was performed using the standard protocol during bolus administration of intravenous contrast. Multiplanar reconstructed images and MIPs were obtained and reviewed to evaluate the vascular anatomy.  CONTRAST: 12m OMNIPAQUE IOHEXOL 350 MG/ML SOLN  COMPARISON: No priors.  FINDINGS: CTA CHEST FINDINGS  Cardiovascular: Heart size is enlarged with biatrial dilatation. There is no significant pericardial fluid, thickening or pericardial calcification. There is aortic atherosclerosis, as well as atherosclerosis of the great vessels of the mediastinum and the coronary arteries, including calcified atherosclerotic plaque in the left main, left anterior descending, left circumflex and right coronary arteries. Severe thickening calcification of the aortic valve. Severe calcifications of the mitral annulus. Dilatation of the pulmonic trunk (4.1 cm in diameter).  Mediastinum/Lymph Nodes: No pathologically enlarged mediastinal or hilar lymph nodes. Multiple densely calcified right hilar and mediastinal lymph nodes are incidentally noted. Esophagus is unremarkable in  appearance. No axillary lymphadenopathy.  Lungs/Pleura: Moderate bilateral pleural effusions lying dependently with areas of passive subsegmental atelectasis in the lower lobes of the lungs bilaterally. In the right lower lobe (axial image 90 of series 6 and coronal image 101 of series 8) there is an elongated nodular density measuring 1.4 x 2.1 x 2.8 cm which appears associated with a pulmonary artery branch to the right lower lobe, and demonstrates enhancement characteristics similar to adjacent vasculature, suspicious for a pulmonary arteriovenous malformation. No other definite suspicious appearing pulmonary nodules or masses are noted. No acute consolidative airspace disease. Mild diffuse bronchial wall thickening with mild centrilobular and paraseptal emphysema.  Musculoskeletal/Soft Tissues: There are no aggressive appearing lytic or blastic lesions noted in the visualized portions of the skeleton.  CTA ABDOMEN AND PELVIS FINDINGS  Hepatobiliary: No suspicious cystic or solid hepatic lesions. No intra or extrahepatic biliary ductal dilatation. Calcified gallstone measuring 1.1 cm in diameter in the dependent portion of the gallbladder. No findings to suggest an acute cholecystitis at this time.  Pancreas: No pancreatic mass. No pancreatic ductal dilatation. No pancreatic or peripancreatic fluid collections or inflammatory changes.  Spleen: Unremarkable.  Adrenals/Urinary Tract: Low-attenuation lesions in the left kidney, largest of which is in the interpolar region where there is an exophytic 5.2 cm lesion, compatible with simple cysts. Other subcentimeter low-attenuation lesions in the right kidney are too small to definitively characterize, but statistically likely to represent tiny cysts. No aggressive appearing renal lesions. No hydroureteronephrosis. Urinary bladder is nearly decompressed, but otherwise unremarkable in appearance.  Stomach/Bowel: The  appearance of the stomach is normal. No pathologic dilatation of small bowel or colon. Short segment of distal small bowel extends into a small right inguinal hernia. The appendix is not confidently identified and may be surgically absent. Regardless, there are no inflammatory changes noted adjacent to the cecum to suggest the presence of an acute appendicitis at this time.  Vascular/Lymphatic: Aortic atherosclerosis, with vascular findings and measurements pertinent to potential TAVR procedure, as detailed below. No aneurysm or dissection noted in the abdominal or pelvic vasculature. No lymphadenopathy noted in the abdomen or pelvis.  Reproductive: Prostate gland is enlarged measuring up to 5.2 x 4.1 x 5.9 cm. Seminal vesicles are unremarkable in appearance.  Other: Small right inguinal hernia containing a short loop of distal small bowel (likely ileum). No significant volume of ascites. No pneumoperitoneum.  Musculoskeletal: There are no aggressive appearing lytic or blastic lesions noted in the visualized portions of the skeleton.  VASCULAR MEASUREMENTS PERTINENT TO TAVR:  AORTA:  Minimal Aortic Diameter-13 x 12 mm  Severity of Aortic Calcification-severe  RIGHT PELVIS:  Right Common Iliac Artery -  Minimal Diameter-8.6 x 9.7 mm  Tortuosity-mild  Calcification-moderate  Right External Iliac Artery -  Minimal Diameter-7.0 x 7.1 mm  Tortuosity-severe  Calcification-mild  Right Common Femoral Artery -  Minimal Diameter-8.4 x 4.3 mm  Tortuosity-mild  Calcification-moderate to severe  LEFT PELVIS:  Left Common Iliac Artery -  Minimal Diameter-9.5 x 9.2 mm  Tortuosity-mild  Calcification-moderate  Left External Iliac Artery -  Minimal Diameter-7.4 x 6.7 mm  Tortuosity-severe  Calcification-none  Left Common Femoral Artery -  Minimal Diameter-8.6 x 7.1 mm  Tortuosity-mild  Calcification-severe  Review of  the MIP images confirms the above findings.  IMPRESSION: 1. Vascular findings and measurements pertinent to potential TAVR procedure, as detailed above. 2. Severe thickening calcification of the aortic valve, compatible with reported clinical history of severe aortic stenosis. 3. Severe dilatation of the pulmonic trunk (4.1 cm in diameter), concerning for pulmonary arterial hypertension. Notably, there is also an elongated nodular density in the right lower lobe which has enhancement characteristics suggestive of a vascular lesions such as a pulmonary arteriovenous malformation. This could be confirmed with PE protocol CT scan if of clinical concern. 4. Severe calcifications of the mitral annulus. 5. Cardiomegaly with biatrial dilatation. 6. Moderate bilateral pleural effusions with areas of passive subsegmental atelectasis in the dependent portions of the lower lobes of the lungs bilaterally. 7. Small right inguinal hernia containing a short segment of distal small bowel, without evidence of bowel incarceration or obstruction at this time. 8. Cholelithiasis without evidence of acute cholecystitis. 9. Prostatomegaly. 10. Additional incidental findings, as above.    Laboratory Data:  High Sensitivity Troponin:  No results for input(s): TROPONINIHS in the last 720 hours.    Chemistry Recent Labs  Lab 10/28/20 0610 10/28/20 0704  NA 138 138  K 3.3* 3.2*  CL 93*  --   CO2 37*  --   GLUCOSE 109*  --   BUN 10  --   CREATININE 0.96  --   CALCIUM 8.3*  --   GFRNONAA >60  --   ANIONGAP 8  --     Recent Labs  Lab 10/28/20 0610  PROT 6.1*  ALBUMIN 2.7*  AST 19  ALT 9  ALKPHOS 107  BILITOT 1.1   Hematology Recent Labs  Lab 10/28/20 0610 10/28/20 0704  WBC 8.0  --   RBC 3.28*  --   HGB 10.2* 10.5*  HCT 32.9* 31.0*  MCV 100.3*  --   MCH 31.1  --   MCHC 31.0  --   RDW 15.9*  --   PLT 223  --    BNP Recent Labs  Lab 10/28/20 0638  BNP 498.5*    DDimer No  results for input(s): DDIMER in the last 168 hours.   Radiology/Studies:  DG Chest Port 1 View  Result Date: 10/28/2020 CLINICAL DATA:  Shortness of breath EXAM: PORTABLE CHEST 1 VIEW COMPARISON:  Fifteen days ago FINDINGS: Cardiomegaly and vascular pedicle widening with congested vessels and bilateral pleural effusion. There is diffuse interstitial opacity and streaky/hazy density at the bases. Dense calcification of the mitral annulus. No pneumothorax. IMPRESSION: CHF pattern. Electronically Signed   By: Monte Fantasia M.D.   On: 10/28/2020 06:53  Assessment and Plan:   Acute on chronic diastolic CHF: pt with very difficult to control heart failure requiring multiple admissions. Multifactorial due to diastolic dysfunction, severe AS and severe MR. Pt has declined significantly since recent hip fracture. Discussed tenuous status with son and that we are hopeful TAVR will make his heart failure easier to control and lessen severity of MR. Plan for admission for IV diuresis and if he remains stable we will do his TAVR as an inpatient next Tuesday. He was treated with one dose of IV lasix 51m in the ER. Will start 660mIV BID with potassium supplementation. He will need strict I/Os and daily weights. He has not been weighed yet but he has a baseline weight ~190 lbs.   Severe AS: as above, tentative TAVR next week pending clinical status.   Chronic afib: Coumadin on hold pending upcoming surgery. INR currently 2.9. Plan to start IV heparin while admitted when INR drops below 2. Rate well controlled  Severe MR: we are hopeful this will improve after TAVR. Anatomy not amendable for TEER.   Hip fracture s/p ORIF:this has significantly set him back. He has made slow progress with rehabilitation. [facility]. Facility is aware and will hold his bed.    Risk Assessment/Risk Scores:        New [place] Heart Association (NYHA)  Functional Class NYHA Class III  CHA2DS2-VASc Score = 5  This indicates a 7.2% annual risk of stroke. The patient's score is based upon: CHF History: Yes HTN History: Yes Diabetes History: Yes Stroke History: No Vascular Disease History: No Age Score: 2 Gender Score: 0    Severity of Illness: The appropriate patient status for this patient is INPATIENT. Inpatient status is judged to be reasonable and necessary in order to provide the required intensity of service to ensure the patient's safety. The patient's presenting symptoms, physical exam findings, and initial radiographic and laboratory data in the context of their chronic comorbidities is felt to place them at high risk for further clinical deterioration. Furthermore, it is not anticipated that the patient will be medically stable for discharge from the hospital within 2 midnights of admission. The following factors support the patient status of inpatient.   " The patient's presenting symptoms include sob. " The worrisome physical exam findings include chf on cxr, elevated bnp. " The initial radiographic and laboratory data are worrisome because of chf on cxr. " The chronic co-morbidities include elderly age, recurrent CHF, afib, severe AS.   * I certify that at the point of admission it is my clinical judgment that the patient will require inpatient hospital care spanning beyond 2 midnights from the point of admission due to high intensity of service, high risk for further deterioration and high frequency of surveillance required.*    For questions or updates, please contact [facility] consult [email or web address] for contact info under     Signed, KaAngelena FormPA-C  10/28/2020 11:20 AM   Patient seen and examined with KaNell RangePA-C.  Agree as above, with the following exceptions and changes as noted below.  Mr. RiUttechs a pleasant [id number] old gentleman well-known to the structural heart team, anticipating  TAVR who presents with recurrent heart failure after recent hospitalization for acute on chronic diastolic heart failure and recent mechanical fall with hip fracture status post ORIF.  He is resting comfortably today but feels that his day yesterday was quite troublesome.  He  was significantly short of breath yesterday and could not lie flat due to dyspnea. Gen: NAD, CV: Irregular rhythm, normal rate.  3/6 systolic ejection quality murmur over right sternal border, 3/6 holosystolic murmur heard best apically and in the axilla.  Lungs: Crackles to the mid lung bilaterally abd: soft, Extrem: Warm, well perfused, 2+ edema, legs wrapped to the mid shin, Neuro/Psych: alert and oriented x 3, normal mood and affect. All available labs, radiology testing, previous records reviewed.   We Will administer IV Lasix 60 mg twice daily.  We can hopefully optimize him from a volume standpoint and plan for inpatient TAVR since this was anticipated soon.  I called his son at his request.  His son asked if TAVR would provide any benefit for his father.  This is a challenging situation, and with repeat admissions for decompensated heart failure, it is unclear how much benefit he will derive from aortic valve replacement.  Nevertheless, relief of aortic valve severe stenosis could improve hemodynamics and may then make it easier to manage symptoms.  This is a complex discussion and I have shared with the patient's son that this discussion should be continued with Dr. Antionette Char team on Monday prior to undergoing TAVR.  Elouise Munroe, MD 10/28/20 4:54 PM

## 2020-10-28 NOTE — Pre-Procedure Instructions (Signed)
Surgical Instructions:    Your procedure is scheduled on Tuesday, 11/01/20 (09:15 AM- 11:15 AM).  Report to [facility] Main Entrance "A" at 07:15 A.M., then check in with the Admitting office.  Call this number if you have problems the morning of surgery:  [phone]   If you have any questions prior to your surgery date call [phone]: Open Monday-Friday 8am-4pm.    Remember:  Do not eat or drink after midnight the night before your surgery.  Per your surgeon's instructions, Stop taking Coumadin on Thursday, October 27, 2020. You will take your last dosage October 26, 2020. Continue taking all other medications without change through the day before surgery. On the morning of surgery do not take any medications   As of today, STOP taking any Aspirin (unless otherwise instructed by your surgeon), NSAIDs such as diclofenac Sodium (VOLTAREN) GEL, Aleve, Naproxen, Ibuprofen, Motrin, Advil, Goody's, BC's, all herbal medications, fish oil, and all vitamins.           The Morning of Surgery:            Do not wear jewelry.            Do not wear lotions, powders, colognes, or deodorant.            Men may shave face and neck.            Do not bring valuables to the hospital.            [facility] is not responsible for any belongings or valuables.  Do NOT Smoke (Tobacco/Vaping) or drink Alcohol 24 hours prior to your procedure.  If you use a CPAP at night, you may bring all equipment for your overnight stay.   Contacts, glasses, dentures or bridgework may not be worn into surgery, please bring cases for these belongings   For patients admitted to the hospital, discharge time will be determined by your treatment team.   Patients discharged the day of surgery will not be allowed to drive home, and someone needs to stay with them for 24 hours.    Special instructions:   [place]- Preparing For Surgery  Before surgery, you can play an important role. Because skin is not sterile,  your skin needs to be as free of germs as possible. You can reduce the number of germs on your skin by washing with CHG (chlorahexidine gluconate) Soap before surgery.  CHG is an antiseptic cleaner which kills germs and bonds with the skin to continue killing germs even after washing.    Oral Hygiene is also important to reduce your risk of infection.  Remember - BRUSH YOUR TEETH THE MORNING OF SURGERY WITH YOUR REGULAR TOOTHPASTE  Please do not use if you have an allergy to CHG or antibacterial soaps. If your skin becomes reddened/irritated stop using the CHG.  Do not shave (including legs and underarms) for at least 48 hours prior to first CHG shower. It is OK to shave your face.  Please follow these instructions carefully.   1. Shower the NIGHT BEFORE SURGERY and the MORNING OF SURGERY  2. If you chose to wash your hair, wash your hair first as usual with your normal shampoo.  3. After you shampoo, rinse your hair and body thoroughly to remove the shampoo.  4. Wash Face and genitals (private parts) with your normal soap.   5.  Shower the NIGHT BEFORE SURGERY and the MORNING OF SURGERY with CHG Soap.   6. Use  CHG Soap as you would any other liquid soap. You can apply CHG directly to the skin and wash gently with a scrungie or a clean washcloth.   7. Apply the CHG Soap to your body ONLY FROM THE NECK DOWN.  Do not use on open wounds or open sores. Avoid contact with your eyes, ears, mouth and genitals (private parts). Wash Face and genitals (private parts)  with your normal soap.   8. Wash thoroughly, paying special attention to the area where your surgery will be performed.  9. Thoroughly rinse your body with warm water from the neck down.  10. DO NOT shower/wash with your normal soap after using and rinsing off the CHG Soap.  11. Pat yourself dry with a CLEAN TOWEL.  12. Wear CLEAN PAJAMAS to bed the night before surgery  13. Place CLEAN SHEETS on your bed the night before your  surgery  14. DO NOT SLEEP WITH PETS.   Day of Surgery: SHOWER WITH CHG SOAP. Wear Clean/Comfortable clothing the morning of surgery. Do not apply any deodorants/lotions.   Remember to brush your teeth WITH YOUR REGULAR TOOTHPASTE.   Please read over the following fact sheets that you were given.

## 2020-10-28 NOTE — ED Triage Notes (Signed)
Pt BIB EMS from [facility] due to sob , edema of lower extremities. Pt called out today for increase sob. Pt takes warfarin and stopped yesterday due to valve replacement procedure Monday. Pt pupils noted to be unequal. Right eye is 3 compared to left eye 1

## 2020-10-28 NOTE — ED Provider Notes (Signed)
[facility] EMERGENCY DEPARTMENT Provider Note   CSN: [id number] Arrival date & time: 10/28/20  7169     History Chief Complaint  Patient presents with  . Shortness of Breath    Ruben [name] is a 87 y.o. male.  Who presents the emergency department with a chief complaint of shortness of breath.  He has an extensive past medical history including atrial fibrillation on long-term anticoagulation with Coumadin, severe aortic stenosis, CHF, COPD, mitral regurg.  The patient states that when he woke this morning he had a lot of difficulty breathing which was worse with lying back better when sitting upright.  His son who is at bedside notes that he has more severe swelling in his lower extremities.  He is normally on 2 L at baseline and is currently on 5 L via nasal cannula.  Patient was taken off of his Coumadin beginning yesterday as he is scheduled for aortic valve replacement this coming Tuesday with Dr. Excell Seltzer.  HPI     Past Medical History:  Diagnosis Date  . Arthritis   . CHF (congestive heart failure) (HCC)   . COPD (chronic obstructive pulmonary disease) (HCC)   . Mitral regurgitation   . Persistent atrial fibrillation (HCC)    on coumadin   . RBBB   . Severe aortic stenosis     Patient Active Problem List   Diagnosis Date Noted  . Acute exacerbation of CHF (congestive heart failure) (HCC) 10/13/2020  . Trochanteric fracture of left femur (HCC) 09/20/2020  . Fall at home, initial encounter 09/20/2020  . BPH (benign prostatic hyperplasia) 09/20/2020  . Mitral regurgitation   . COPD (chronic obstructive pulmonary disease) (HCC)   . Acute on chronic congestive heart failure (HCC)   . Severe aortic stenosis   . Chronic diastolic CHF (congestive heart failure) (HCC) 07/15/2020  . COPD with acute exacerbation (HCC) 07/15/2020  . Atrial fibrillation, chronic (HCC) 07/15/2020    Past Surgical History:  Procedure Laterality Date  . APPENDECTOMY    .  BUBBLE STUDY  09/07/2020   Procedure: BUBBLE STUDY;  Surgeon: Sande Rives, MD;  Location: [facility] ENDOSCOPY;  Service: Cardiovascular;;  . CARDIAC CATHETERIZATION    . CLAVICLE SURGERY    . INTRAMEDULLARY (IM) NAIL INTERTROCHANTERIC Left 09/20/2020   Procedure: INTRAMEDULLARY (IM) NAIL INTERTROCHANTRIC;  Surgeon: Sheral Apley, MD;  Location: [facility] OR;  Service: Orthopedics;  Laterality: Left;  . RIGHT/LEFT HEART CATH AND CORONARY ANGIOGRAPHY N/A 08/24/2020   Procedure: RIGHT/LEFT HEART CATH AND CORONARY ANGIOGRAPHY;  Surgeon: Tonny Bollman, MD;  Location: [facility] INVASIVE CV LAB;  Service: Cardiovascular;  Laterality: N/A;  . TEE WITHOUT CARDIOVERSION N/A 09/07/2020   Procedure: TRANSESOPHAGEAL ECHOCARDIOGRAM (TEE);  Surgeon: Sande Rives, MD;  Location: [facility] ENDOSCOPY;  Service: Cardiovascular;  Laterality: N/A;  . TOTAL KNEE ARTHROPLASTY Bilateral        History reviewed. No pertinent family history.  Social History   Tobacco Use  . Smoking status: Former Smoker    Packs/day: 1.00    Years: 30.00    Pack years: 30.00    Quit date: 1975    Years since quitting: 47.2  . Smokeless tobacco: Never Used  Vaping Use  . Vaping Use: Never used  Substance Use Topics  . Alcohol use: Not Currently  . Drug use: Never    Home Medications Prior to Admission medications   Medication Sig Start Date End Date Taking? Authorizing Provider  diclofenac Sodium (VOLTAREN) 1 % GEL Apply 1  application topically 3 (three) times daily as needed (for pain).    [provider]  dutasteride (AVODART) 0.5 MG capsule Take 0.5 mg by mouth in the morning and at bedtime. 05/21/20   [provider]  Ferrous Gluconate 324 (37.5 Fe) MG TABS Take 1 tablet by mouth every other day.    [provider]  levalbuterol (XOPENEX HFA) 45 MCG/ACT inhaler Inhale 1 puff into the lungs every 4 (four) hours as needed for wheezing or shortness of breath.    [provider]  levalbuterol  Pauline Aus) 0.63 MG/3ML nebulizer solution Take 0.63 mg by nebulization 2 (two) times daily.    [provider]  metoprolol succinate (TOPROL-XL) 50 MG 24 hr tablet Take 50 mg by mouth daily. 04/23/20   [provider]  montelukast (SINGULAIR) 10 MG tablet Take 10 mg by mouth at bedtime. 04/19/20   [provider]  polyethylene glycol (MIRALAX / GLYCOLAX) 17 g packet Take 17 g by mouth daily as needed for mild constipation. 07/19/20   Osvaldo Shipper, MD  polyvinyl alcohol (LIQUIFILM TEARS) 1.4 % ophthalmic solution Place 1 drop into both eyes 2 (two) times daily as needed for dry eyes.    [provider]  potassium chloride SA (KLOR-CON) 20 MEQ tablet Take 1 tablet (20 mEq total) by mouth daily. 10/12/20   Janetta Hora, PA-C  senna-docusate (SENNA-PLUS) 8.6-50 MG tablet Take 1 tablet by mouth daily.    [provider]  SPIRIVA RESPIMAT 2.5 MCG/ACT AERS Inhale 2 puffs into the lungs as needed. 10/18/20   [provider]  tamsulosin (FLOMAX) 0.4 MG CAPS capsule Take 0.8 mg by mouth at bedtime. 05/21/20   [provider]  torsemide (DEMADEX) 20 MG tablet Take 40 mg by mouth at bedtime. 10/17/20   [provider]  vitamin B-12 (CYANOCOBALAMIN) 500 MCG tablet Take 500 mcg by mouth daily.    [provider]  warfarin (COUMADIN) 1 MG tablet Take 1 mg by mouth daily.    [provider]  warfarin (COUMADIN) 2.5 MG tablet Take 2.5 mg by mouth daily.    [provider]    Allergies    Patient has no known allergies.  Review of Systems   Review of Systems   Ten systems reviewed and are negative for acute change, except as noted in the HPI.   Physical Exam Updated Vital Signs BP 130/71   Pulse 94   Temp 97.9 F (36.6 C) (Oral)   Resp (!) 22   SpO2 90%   Physical Exam Vitals and nursing note reviewed.  Constitutional:      Appearance: He is well-developed. He is not diaphoretic.     Interventions:  Nasal cannula in place.  HENT:     Head: Normocephalic and atraumatic.  Eyes:     General: No scleral icterus.    Conjunctiva/sclera: Conjunctivae normal.  Cardiovascular:     Rate and Rhythm: Normal rate and regular rhythm.     Heart sounds: Normal heart sounds.  Pulmonary:     Effort: Tachypnea present. No respiratory distress.     Breath sounds: Decreased air movement present. Rales present.  Abdominal:     Palpations: Abdomen is soft.     Tenderness: There is no abdominal tenderness.  Musculoskeletal:     Cervical back: Normal range of motion and neck supple.     Right lower leg: Edema present.     Left lower leg: Edema present.  Skin:  General: Skin is warm and dry.  Neurological:     Mental Status: He is alert.  Psychiatric:        Behavior: Behavior normal. Behavior is cooperative.     ED Results / Procedures / Treatments   Labs (all labs ordered are listed, but only abnormal results are displayed) Labs Reviewed  CBC WITH DIFFERENTIAL/PLATELET - Abnormal; Notable for the following components:      Result Value   RBC 3.28 (*)    Hemoglobin 10.2 (*)    HCT 32.9 (*)    MCV 100.3 (*)    RDW 15.9 (*)    All other components within normal limits  COMPREHENSIVE METABOLIC PANEL - Abnormal; Notable for the following components:   Potassium 3.3 (*)    Chloride 93 (*)    CO2 37 (*)    Glucose, Bld 109 (*)    Calcium 8.3 (*)    Total Protein 6.1 (*)    Albumin 2.7 (*)    All other components within normal limits  PROTIME-INR - Abnormal; Notable for the following components:   Prothrombin Time 29.1 (*)    INR 2.9 (*)    All other components within normal limits  I-STAT ARTERIAL BLOOD GAS, ED - Abnormal; Notable for the following components:   pH, Arterial 7.452 (*)    pCO2 arterial 56.5 (*)    pO2, Arterial 62 (*)    Bicarbonate 39.5 (*)    TCO2 41 (*)    Acid-Base Excess 14.0 (*)    Potassium 3.2 (*)    Calcium, Ion 1.13 (*)    HCT 31.0 (*)    Hemoglobin  10.5 (*)    All other components within normal limits  BRAIN NATRIURETIC PEPTIDE  MAGNESIUM    EKG EKG Interpretation  Date/Time:  Friday October 28 2020 09:36:54 EDT Ventricular Rate:  84 PR Interval:    QRS Duration: 142 QT Interval:  391 QTC Calculation: 463 R Axis:   87 Text Interpretation: Atrial fibrillation Ventricular premature complex Right bundle branch block Confirmed by Lorre NickAllen, Anthony ([id number]) on 10/28/2020 10:36:46 AM   Radiology DG Chest Port 1 View  Result Date: 10/28/2020 CLINICAL DATA:  Shortness of breath EXAM: PORTABLE CHEST 1 VIEW COMPARISON:  Fifteen days ago FINDINGS: Cardiomegaly and vascular pedicle widening with congested vessels and bilateral pleural effusion. There is diffuse interstitial opacity and streaky/hazy density at the bases. Dense calcification of the mitral annulus. No pneumothorax. IMPRESSION: CHF pattern. Electronically Signed   By: Marnee SpringJonathon  Watts M.D.   On: 10/28/2020 06:53    Procedures .Critical Care Performed by: Arthor CaptainHarris, Jazell Rosenau, PA-C Authorized by: Arthor CaptainHarris, Accalia Rigdon, PA-C   Critical care provider statement:    Critical care time (minutes):  50   Critical care time was exclusive of:  Separately billable procedures and treating other patients   Critical care was necessary to treat or prevent imminent or life-threatening deterioration of the following conditions:  Respiratory failure and cardiac failure   Critical care was time spent personally by me on the following activities:  Discussions with consultants, evaluation of patient's response to treatment, examination of patient, ordering and performing treatments and interventions, ordering and review of laboratory studies, ordering and review of radiographic studies, pulse oximetry, re-evaluation of patient's condition, obtaining history from patient or surrogate and review of old charts     Medications Ordered in ED Medications  furosemide (LASIX) injection 40 mg (has no administration in  time range)  potassium chloride SA (KLOR-CON) CR tablet 20 mEq (  has no administration in time range)    ED Course  I have reviewed the triage vital signs and the nursing notes.  Pertinent labs & imaging results that were available during my care of the patient were reviewed by me and considered in my medical decision making (see chart for details).    MDM Rules/Calculators/A&P                          Patient here with sob. The emergent differential diagnosis for shortness of breath includes, but is not limited to, Pulmonary edema, bronchoconstriction, Pneumonia, Pulmonary embolism, Pneumotherax/ Hemothorax, Dysrythmia, ACS.  I ordered and reviewed the patient's labs which shows CBC with macrocytic anemia appears chronic.  CMP shows mild hypokalemia at 3.3 low protein levels, calcium just below normal.  PT/INR elevated at 2.9.  Magnesium 1.6 and just below normal level.  BNP at 500. Abg shows metabolic alkalosis? I ordered and reviewed chest x-ray that shows obvious  pulmonary edema. EKG shows rate controlled atrial fibrillation at a rate of 84 Patient given IV lasix and admitted to the cardiology service.   Final Clinical Impression(s) / ED Diagnoses Final diagnoses:  Acute on chronic congestive heart failure, unspecified heart failure type (HCC)  Hypomagnesemia  Hypokalemia  Acute on chronic respiratory failure with hypoxia [facility])    Rx / DC Orders ED Discharge Orders    None       Arthor Captain, PA-C 10/28/20 1229    Lorre Nick, MD 10/31/20 [phone]

## 2020-10-29 ENCOUNTER — Inpatient Hospital Stay (HOSPITAL_COMMUNITY): Payer: Medicare Other

## 2020-10-29 DIAGNOSIS — J449 Chronic obstructive pulmonary disease, unspecified: Secondary | ICD-10-CM | POA: Diagnosis not present

## 2020-10-29 DIAGNOSIS — I350 Nonrheumatic aortic (valve) stenosis: Secondary | ICD-10-CM | POA: Diagnosis not present

## 2020-10-29 DIAGNOSIS — I5031 Acute diastolic (congestive) heart failure: Secondary | ICD-10-CM

## 2020-10-29 DIAGNOSIS — I10 Essential (primary) hypertension: Secondary | ICD-10-CM | POA: Diagnosis not present

## 2020-10-29 DIAGNOSIS — I5033 Acute on chronic diastolic (congestive) heart failure: Secondary | ICD-10-CM | POA: Diagnosis not present

## 2020-10-29 LAB — BASIC METABOLIC PANEL
Anion gap: 7 (ref 5–15)
BUN: 13 mg/dL (ref 8–23)
CO2: 39 mmol/L — ABNORMAL HIGH (ref 22–32)
Calcium: 8.4 mg/dL — ABNORMAL LOW (ref 8.9–10.3)
Chloride: 95 mmol/L — ABNORMAL LOW (ref 98–111)
Creatinine, Ser: 0.87 mg/dL (ref 0.61–1.24)
GFR, Estimated: >60 mL/min (ref >60)
Glucose, Bld: 101 mg/dL — ABNORMAL HIGH (ref 70–99)
Potassium: 3.8 mmol/L (ref 3.5–5.1)
Sodium: 141 mmol/L (ref 135–145)

## 2020-10-29 LAB — ECHOCARDIOGRAM LIMITED
AR max vel: 0.41 cm2
AV Area VTI: 0.37 cm2
AV Area mean vel: 0.39 cm2
AV Mean grad: 36 mmHg
AV Peak grad: 61.3 mmHg
Ao pk vel: 3.91 m/s
Height: 67 in
S' Lateral: 2.8 cm
Weight: 3118.19 oz

## 2020-10-29 LAB — PROTIME-INR
INR: 2.2 — ABNORMAL HIGH (ref 0.8–1.2)
Prothrombin Time: 24 seconds — ABNORMAL HIGH (ref 11.4–15.2)

## 2020-10-29 MED ORDER — POTASSIUM CHLORIDE CRYS ER 20 MEQ PO TBCR
40.0000 meq | EXTENDED_RELEASE_TABLET | Freq: Two times a day (BID) | ORAL | Status: DC
  Administered 2020-10-29 – 2020-10-31 (×5): 40 meq via ORAL
  Filled 2020-10-29 (×4): qty 2

## 2020-10-29 MED ORDER — FUROSEMIDE 10 MG/ML IJ SOLN
80.0000 mg | Freq: Two times a day (BID) | INTRAMUSCULAR | Status: DC
  Administered 2020-10-29: 80 mg via INTRAVENOUS
  Filled 2020-10-29: qty 8

## 2020-10-29 NOTE — Plan of Care (Signed)
  Problem: Education: Goal: Knowledge of General Education information will improve Description: Including pain rating scale, medication(s)/side effects and non-pharmacologic comfort measures Outcome: Progressing   Problem: Education: Goal: Ability to demonstrate management of disease process will improve Outcome: Progressing Goal: Ability to verbalize understanding of medication therapies will improve Outcome: Progressing Goal: Individualized Educational Video(s) Outcome: Progressing   Problem: Activity: Goal: Capacity to carry out activities will improve Outcome: Progressing Up to the chair for meals    Problem: Cardiac: Goal: Ability to achieve and maintain adequate cardiopulmonary perfusion will improve Outcome: Progressing

## 2020-10-29 NOTE — Social Work (Addendum)
CSW spoke with patient in regards to his discharging planning and that he was from [facility]. Patient confirmed that he will be having a procedure on Tuesday and he would to revisit his discharging planning after his procedure.  CSW confirmed from [facility] that patient could return. CSW updated facility that patient will be at hospital until procedure on Tuesday. Star confirmed that if pt returns a DC summary would be needed.  TOC team will continue to assist with discharge planning needs.

## 2020-10-29 NOTE — Progress Notes (Signed)
Cardiology Progress Note  Patient ID: Ruben [name] MRN: [id number] DOB: 12-16-1926 Date of Encounter: 10/29/2020  Primary Cardiologist: Evalina Field, MD  Subjective   Chief Complaint: Shortness of breath  HPI: Shortness of breath.  Decent urine output.  Still volume up.  Needs further diuresis.  Anticipate TAVR this admission.  ROS:  All other ROS reviewed and negative. Pertinent positives noted in the HPI.     Inpatient Medications  Scheduled Meds: . dutasteride  0.5 mg Oral Daily  . ferrous gluconate  324 mg Oral QODAY  . furosemide  60 mg Intravenous BID  . ipratropium-albuterol  3 mL Nebulization Q6H  . metoprolol succinate  50 mg Oral Daily  . montelukast  10 mg Oral QHS  . potassium chloride  20 mEq Oral BID  . senna-docusate  2 tablet Oral Daily  . sodium chloride flush  3 mL Intravenous Q12H  . tamsulosin  0.8 mg Oral QHS  . umeclidinium bromide  1 puff Inhalation Daily  . vitamin B-12  500 mcg Oral Daily   Continuous Infusions: . sodium chloride     PRN Meds: sodium chloride, acetaminophen, diclofenac Sodium, ondansetron (ZOFRAN) IV, polyethylene glycol, sodium chloride flush   Vital Signs   Vitals:   10/29/20 0245 10/29/20 0352 10/29/20 0757 10/29/20 0812  BP:  124/83    Pulse:  90    Resp:  20 19   Temp:  97.8 F (36.6 C) 97.8 F (36.6 C)   TempSrc:  Oral Oral   SpO2: 100% 96%  91%  Weight:  88.4 kg    Height:        Intake/Output Summary (Last 24 hours) at 10/29/2020 0852 Last data filed at 10/28/2020 2000 Gross per 24 hour  Intake 720 ml  Output 801 ml  Net -81 ml   Last 3 Weights 10/29/2020 10/28/2020 10/28/2020  Weight (lbs) 194 lb 14.2 oz 195 lb 4.8 oz 195 lb 1.7 oz  Weight (kg) 88.4 kg 88.587 kg 88.5 kg      Telemetry  Overnight telemetry shows A. fib heart rates 100s to 120 bpm, which I personally reviewed.   Physical Exam   Vitals:   10/29/20 0245 10/29/20 0352 10/29/20 0757 10/29/20 0812  BP:  124/83    Pulse:  90    Resp:  20  19   Temp:  97.8 F (36.6 C) 97.8 F (36.6 C)   TempSrc:  Oral Oral   SpO2: 100% 96%  91%  Weight:  88.4 kg    Height:         Intake/Output Summary (Last 24 hours) at 10/29/2020 0852 Last data filed at 10/28/2020 2000 Gross per 24 hour  Intake 720 ml  Output 801 ml  Net -81 ml    Last 3 Weights 10/29/2020 10/28/2020 10/28/2020  Weight (lbs) 194 lb 14.2 oz 195 lb 4.8 oz 195 lb 1.7 oz  Weight (kg) 88.4 kg 88.587 kg 88.5 kg    Body mass index is 30.52 kg/m.  General: Well nourished, well developed, in no acute distress Head: Atraumatic, normal size  Eyes: PEERLA, EOMI  Neck: Supple, JVD 10 to 12 cm of water Endocrine: No thryomegaly Cardiac: Normal S1, S2; irregular rhythm, loud harsh systolic ejection murmur, late peaking also with harsh holosystolic murmur best heard at the apex Lungs: Rales bilaterally Abd: Soft, nontender, no hepatomegaly  Ext: 2+ pitting edema Musculoskeletal: No deformities, BUE and BLE strength normal and equal Skin: Warm and dry, no rashes  Neuro: Alert and oriented to person, place, time, and situation, CNII-XII grossly intact, no focal deficits  Psych: Normal mood and affect   Labs  High Sensitivity Troponin:  No results for input(s): TROPONINIHS in the last 720 hours.   Cardiac EnzymesNo results for input(s): TROPONINI in the last 168 hours. No results for input(s): TROPIPOC in the last 168 hours.  Chemistry Recent Labs  Lab 10/28/20 0610 10/28/20 0704 10/29/20 0157  NA 138 138 141  K 3.3* 3.2* 3.8  CL 93*  --  95*  CO2 37*  --  39*  GLUCOSE 109*  --  101*  BUN 10  --  13  CREATININE 0.96  --  0.87  CALCIUM 8.3*  --  8.4*  PROT 6.1*  --   --   ALBUMIN 2.7*  --   --   AST 19  --   --   ALT 9  --   --   ALKPHOS 107  --   --   BILITOT 1.1  --   --   GFRNONAA >60  --  >60  ANIONGAP 8  --  7    Hematology Recent Labs  Lab 10/28/20 0610 10/28/20 0704  WBC 8.0  --   RBC 3.28*  --   HGB 10.2* 10.5*  HCT 32.9* 31.0*  MCV 100.3*  --    MCH 31.1  --   MCHC 31.0  --   RDW 15.9*  --   PLT 223  --    BNP Recent Labs  Lab 10/28/20 0638  BNP 498.5*    DDimer No results for input(s): DDIMER in the last 168 hours.   Radiology  DG Chest Port 1 View  Result Date: 10/28/2020 CLINICAL DATA:  Shortness of breath EXAM: PORTABLE CHEST 1 VIEW COMPARISON:  Fifteen days ago FINDINGS: Cardiomegaly and vascular pedicle widening with congested vessels and bilateral pleural effusion. There is diffuse interstitial opacity and streaky/hazy density at the bases. Dense calcification of the mitral annulus. No pneumothorax. IMPRESSION: CHF pattern. Electronically Signed   By: Monte Fantasia M.D.   On: 10/28/2020 06:53    Cardiac Studies  TTE 07/16/2020 1. Left ventricular ejection fraction, by estimation, is 55 to 60%. The  left ventricle has normal function. The left ventricle has no regional  wall motion abnormalities. There is moderate left ventricular hypertrophy.  Left ventricular diastolic  parameters are indeterminate.  2. Right ventricular systolic function is moderately reduced. The right  ventricular size is mildly enlarged. There is mildly elevated pulmonary  artery systolic pressure.  3. Left atrial size was moderately dilated.  4. Mild functional mitral stenosis due to severe MAC. The mitral valve is  degenerative. Mild mitral valve regurgitation. Mild mitral stenosis.  Severe mitral annular calcification.  5. Tricuspid valve regurgitation is moderate.  6. The aortic valve is tricuspid. There is severe calcifcation of the  aortic valve. There is severe thickening of the aortic valve. Aortic valve  regurgitation is not visualized. Severe aortic valve stenosis.  7. The inferior vena cava is normal in size with greater than 50%  respiratory variability, suggesting right atrial pressure of 3 mmHg.   Patient Profile  Ruben [name] is a 87 y.o. male with COPD on home 2 L, severe aortic stenosis, moderate to severe  mitral vegetation, persistent atrial fibrillation on Coumadin, diastolic heart failure who was admitted on 10/28/2020 with acute on chronic diastolic heart failure.  Assessment & Plan   1.  Acute on chronic diastolic  heart failure, EF 55 to 60% -Admitted twice now in the past 2 months with recurrent heart failure.  His EF in December was normal.  I think we need to reevaluate his heart function with a limited echo.  Clearly something has changed. -He does have severe aortic stenosis and is awaiting TAVR.  Also has moderate severe mitral regurgitation.  This could be contributing.  Overall I do not think his aortic stenosis is helping. -Increase Lasix to 80 mg IV twice daily.  Potassium supplementation.  2.  Severe aortic stenosis/moderate to severe mitral regurgitation -He is awaiting TAVR.  Given that he has had recurrent heart failure admissions I think we should consider TAVR this admission. -Coumadin is being held with plans to transition to heparin drip in anticipation of this. -I think his MR is not contributing.  He does have a flail small segment however the volume is likely low.  It is greater in the setting of severe AS.  Not a clip candidate.  Would treat the AAS with TAVR.  3.  Permanent atrial fibrillation on Coumadin -Continue metoprolol succinate for rate control. -Holding Coumadin. -Start heparin drip once INR less than 2.  Plans for TAVR this admission.  4.  COPD on home 2 L of oxygen -Continue home inhalers.  Does not appear to be in exacerbation.  This appears to be heart failure.  FEN -No intravenous fluids -Diet: Salt restricted -Code: DNR -DVT PPx: Heparin drip once INR falls below 2.  Was on Coumadin at home.  For questions or updates, please contact [place] Please consult [email or web address] for contact info under    Time Spent with Patient: I have spent a total of 35 minutes with patient reviewing hospital notes, telemetry, EKGs, labs and examining the patient  as well as establishing an assessment and plan that was discussed with the patient.  > 50% of time was spent in direct patient care.    Signed, Addison Naegeli. Audie Box, MD, [place]  10/29/2020 8:52 AM

## 2020-10-29 NOTE — Progress Notes (Signed)
  Echocardiogram 2D Echocardiogram has been performed.  Ruben [name] 10/29/2020, 11:44 AM

## 2020-10-30 DIAGNOSIS — I5033 Acute on chronic diastolic (congestive) heart failure: Secondary | ICD-10-CM | POA: Diagnosis not present

## 2020-10-30 LAB — HEPARIN LEVEL (UNFRACTIONATED): Heparin Unfractionated: 0.14 IU/mL — ABNORMAL LOW (ref 0.30–0.70)

## 2020-10-30 LAB — BASIC METABOLIC PANEL
Anion gap: 7 (ref 5–15)
BUN: 16 mg/dL (ref 8–23)
CO2: 36 mmol/L — ABNORMAL HIGH (ref 22–32)
Calcium: 8.3 mg/dL — ABNORMAL LOW (ref 8.9–10.3)
Chloride: 95 mmol/L — ABNORMAL LOW (ref 98–111)
Creatinine, Ser: 0.85 mg/dL (ref 0.61–1.24)
GFR, Estimated: >60 mL/min (ref >60)
Glucose, Bld: 104 mg/dL — ABNORMAL HIGH (ref 70–99)
Potassium: 3.7 mmol/L (ref 3.5–5.1)
Sodium: 138 mmol/L (ref 135–145)

## 2020-10-30 LAB — PROTIME-INR
INR: 1.6 — ABNORMAL HIGH (ref 0.8–1.2)
Prothrombin Time: 18.5 seconds — ABNORMAL HIGH (ref 11.4–15.2)

## 2020-10-30 MED ORDER — METOLAZONE 5 MG PO TABS
5.0000 mg | ORAL_TABLET | Freq: Once | ORAL | Status: AC
  Administered 2020-10-30: 5 mg via ORAL
  Filled 2020-10-30: qty 1

## 2020-10-30 MED ORDER — HEPARIN (PORCINE) 25000 UT/250ML-% IV SOLN
1400.0000 [IU]/h | INTRAVENOUS | Status: DC
  Administered 2020-10-30: 1100 [IU]/h via INTRAVENOUS
  Administered 2020-10-31: 1400 [IU]/h via INTRAVENOUS
  Filled 2020-10-30 (×3): qty 250

## 2020-10-30 MED ORDER — IPRATROPIUM-ALBUTEROL 0.5-2.5 (3) MG/3ML IN SOLN
3.0000 mL | Freq: Three times a day (TID) | RESPIRATORY_TRACT | Status: DC
  Administered 2020-10-30 – 2020-10-31 (×2): 3 mL via RESPIRATORY_TRACT
  Filled 2020-10-30 (×3): qty 3

## 2020-10-30 MED ORDER — FUROSEMIDE 10 MG/ML IJ SOLN
120.0000 mg | Freq: Two times a day (BID) | INTRAVENOUS | Status: DC
  Administered 2020-10-30 – 2020-10-31 (×3): 120 mg via INTRAVENOUS
  Filled 2020-10-30 (×3): qty 12
  Filled 2020-10-30: qty 10

## 2020-10-30 NOTE — Progress Notes (Signed)
MD, pt may have some sleep apnea, pt's sats would drop in the 70-80's occasionally of and on throughout the night, but it would never sustain for long, pt is on O2 at 3L, I did raise it up to 4L to help with the low sats, will continue to monitor, Thanks Lavonda Jumbo RN.

## 2020-10-30 NOTE — Progress Notes (Signed)
ANTICOAGULATION CONSULT NOTE - Initial Consult  Pharmacy Consult for Heparin Indication: atrial fibrillation  No Known Allergies  Patient Measurements: Height: 5\' 7"  (170.2 cm) Weight: 88.6 kg (195 lb 5.2 oz) IBW/kg (Calculated) : 66.1 Heparin Dosing Weight: 80 kg  Vital Signs: Temp: 97.7 F (36.5 C) (03/27 0329) Temp Source: Oral (03/27 0329) BP: 95/64 (03/27 0329) Pulse Rate: 91 (03/27 0329)  Labs: Recent Labs    10/28/20 0610 10/28/20 0704 10/29/20 0157 10/30/20 0306  HGB 10.2* 10.5*  --   --   HCT 32.9* 31.0*  --   --   PLT 223  --   --   --   LABPROT 29.1*  --  24.0* 18.5*  INR 2.9*  --  2.2* 1.6*  CREATININE 0.96  --  0.87 0.85    Estimated Creatinine Clearance: 60.1 mL/min (by C-G formula based on SCr of 0.85 mg/dL).   Medical History: Past Medical History:  Diagnosis Date  . Arthritis   . CHF (congestive heart failure) (HCC)   . COPD (chronic obstructive pulmonary disease) (HCC)   . Mitral regurgitation   . Persistent atrial fibrillation (HCC)    on coumadin   . RBBB   . Severe aortic stenosis     Medications:  Scheduled:  . dutasteride  0.5 mg Oral Daily  . ferrous gluconate  324 mg Oral QODAY  . ipratropium-albuterol  3 mL Nebulization Q6H  . metolazone  5 mg Oral Once  . metoprolol succinate  50 mg Oral Daily  . montelukast  10 mg Oral QHS  . potassium chloride  40 mEq Oral BID  . senna-docusate  2 tablet Oral Daily  . sodium chloride flush  3 mL Intravenous Q12H  . tamsulosin  0.8 mg Oral QHS  . umeclidinium bromide  1 puff Inhalation Daily  . vitamin B-12  500 mcg Oral Daily    Assessment: 87 y.o. male with h/o Afib, Coumadin on hold while awaiting TAVR and INR subtherapeutic, for heparin Goal of Therapy:  Heparin level 0.3-0.7 units/ml Monitor platelets by anticoagulation protocol: Yes   Plan:  Start heparin 1100 units/hr Check heparin level in 8 hours.   82 10/30/2020,6:06 AM

## 2020-10-30 NOTE — Progress Notes (Signed)
ANTICOAGULATION CONSULT NOTE  Pharmacy Consult for Heparin Indication: atrial fibrillation  No Known Allergies  Patient Measurements: Height: 5\' 7"  (170.2 cm) Weight: 88.6 kg (195 lb 5.2 oz) IBW/kg (Calculated) : 66.1 Heparin Dosing Weight: 80 kg  Vital Signs: Temp: 98.4 F (36.9 C) (03/27 1633) Temp Source: Oral (03/27 1633) BP: 99/60 (03/27 1633) Pulse Rate: 95 (03/27 1633)  Labs: Recent Labs    10/28/20 0610 10/28/20 0704 10/29/20 0157 10/30/20 0306 10/30/20 1616  HGB 10.2* 10.5*  --   --   --   HCT 32.9* 31.0*  --   --   --   PLT 223  --   --   --   --   LABPROT 29.1*  --  24.0* 18.5*  --   INR 2.9*  --  2.2* 1.6*  --   HEPARINUNFRC  --   --   --   --  0.14*  CREATININE 0.96  --  0.87 0.85  --     Estimated Creatinine Clearance: 60.1 mL/min (by C-G formula based on SCr of 0.85 mg/dL).   Medical History: Past Medical History:  Diagnosis Date  . Arthritis   . CHF (congestive heart failure) (HCC)   . COPD (chronic obstructive pulmonary disease) (HCC)   . Mitral regurgitation   . Persistent atrial fibrillation (HCC)    on coumadin   . RBBB   . Severe aortic stenosis     Medications:  Scheduled:  . dutasteride  0.5 mg Oral Daily  . ferrous gluconate  324 mg Oral QODAY  . ipratropium-albuterol  3 mL Nebulization TID  . metoprolol succinate  50 mg Oral Daily  . montelukast  10 mg Oral QHS  . potassium chloride  40 mEq Oral BID  . senna-docusate  2 tablet Oral Daily  . sodium chloride flush  3 mL Intravenous Q12H  . tamsulosin  0.8 mg Oral QHS  . umeclidinium bromide  1 puff Inhalation Daily  . vitamin B-12  500 mcg Oral Daily    Assessment: 87 y.o. male with h/o Afib, Coumadin on hold while awaiting TAVR and INR subtherapeutic on heparin at 1100 units/hr -heparin level= 0.14, INR= 1.6   Goal of Therapy:  Heparin level 0.3-0.7 units/ml Monitor platelets by anticoagulation protocol: Yes   Plan:  -Increase heparin to 1300 units/hr -Heparin  level in 8 hours and daily wth CBC daily  82, PharmD Clinical Pharmacist **Pharmacist phone directory can now be found on [email or web address] ([email or web address]).  Listed under [facility].

## 2020-10-30 NOTE — Progress Notes (Signed)
Cardiology Progress Note  Patient ID: Ruben [name] MRN: [id number] DOB: February 24, 1927 Date of Encounter: 10/30/2020  Primary Cardiologist: Reatha Harps, MD  Subjective   Chief Complaint: Shortness of breath.  HPI: Still short of breath.  Urine output not accurate.  Weights not accurate either.  Appears to be improving but still not back to baseline.  ROS:  All other ROS reviewed and negative. Pertinent positives noted in the HPI.     Inpatient Medications  Scheduled Meds: . dutasteride  0.5 mg Oral Daily  . ferrous gluconate  324 mg Oral QODAY  . ipratropium-albuterol  3 mL Nebulization TID  . metoprolol succinate  50 mg Oral Daily  . montelukast  10 mg Oral QHS  . potassium chloride  40 mEq Oral BID  . senna-docusate  2 tablet Oral Daily  . sodium chloride flush  3 mL Intravenous Q12H  . tamsulosin  0.8 mg Oral QHS  . umeclidinium bromide  1 puff Inhalation Daily  . vitamin B-12  500 mcg Oral Daily   Continuous Infusions: . sodium chloride    . furosemide    . heparin 1,100 Units/hr (10/30/20 0746)   PRN Meds: sodium chloride, acetaminophen, diclofenac Sodium, ondansetron (ZOFRAN) IV, polyethylene glycol, sodium chloride flush   Vital Signs   Vitals:   10/30/20 0104 10/30/20 0132 10/30/20 0329 10/30/20 0739  BP:   95/64   Pulse:   91   Resp:   18   Temp:   97.7 F (36.5 C)   TempSrc:   Oral   SpO2: 93% 94% 97% 99%  Weight:   88.6 kg   Height:        Intake/Output Summary (Last 24 hours) at 10/30/2020 0937 Last data filed at 10/30/2020 0000 Gross per 24 hour  Intake 700 ml  Output 1100 ml  Net -400 ml   Last 3 Weights 10/30/2020 10/29/2020 10/28/2020  Weight (lbs) 195 lb 5.2 oz 194 lb 14.2 oz 195 lb 4.8 oz  Weight (kg) 88.6 kg 88.4 kg 88.587 kg      Telemetry  Overnight telemetry shows atrial fibrillation heart rate 80 to 100 bpm, which I personally reviewed.   Physical Exam   Vitals:   10/30/20 0104 10/30/20 0132 10/30/20 0329 10/30/20 0739  BP:    95/64   Pulse:   91   Resp:   18   Temp:   97.7 F (36.5 C)   TempSrc:   Oral   SpO2: 93% 94% 97% 99%  Weight:   88.6 kg   Height:         Intake/Output Summary (Last 24 hours) at 10/30/2020 0937 Last data filed at 10/30/2020 0000 Gross per 24 hour  Intake 700 ml  Output 1100 ml  Net -400 ml    Last 3 Weights 10/30/2020 10/29/2020 10/28/2020  Weight (lbs) 195 lb 5.2 oz 194 lb 14.2 oz 195 lb 4.8 oz  Weight (kg) 88.6 kg 88.4 kg 88.587 kg    Body mass index is 30.59 kg/m.  General: Well nourished, well developed, in no acute distress Head: Atraumatic, normal size  Eyes: PEERLA, EOMI  Neck: Supple, JVD 8 to 10 cm of water Endocrine: No thryomegaly Cardiac: Normal S1, S2; irregular rhythm, harsh 3 out of 6 systolic ejection murmur, late peaking Lungs: Diminished breath sounds of the lung bases Abd: Soft, nontender, no hepatomegaly  Ext: Trace edema, leg wrappings in place Musculoskeletal: No deformities, BUE and BLE strength normal and equal Skin: Warm and dry,  no rashes   Neuro: Alert and oriented to person, place, time, and situation, CNII-XII grossly intact, no focal deficits  Psych: Normal mood and affect   Labs  High Sensitivity Troponin:  No results for input(s): TROPONINIHS in the last 720 hours.   Cardiac EnzymesNo results for input(s): TROPONINI in the last 168 hours. No results for input(s): TROPIPOC in the last 168 hours.  Chemistry Recent Labs  Lab 10/28/20 0610 10/28/20 0704 10/29/20 0157 10/30/20 0306  NA 138 138 141 138  K 3.3* 3.2* 3.8 3.7  CL 93*  --  95* 95*  CO2 37*  --  39* 36*  GLUCOSE 109*  --  101* 104*  BUN 10  --  13 16  CREATININE 0.96  --  0.87 0.85  CALCIUM 8.3*  --  8.4* 8.3*  PROT 6.1*  --   --   --   ALBUMIN 2.7*  --   --   --   AST 19  --   --   --   ALT 9  --   --   --   ALKPHOS 107  --   --   --   BILITOT 1.1  --   --   --   GFRNONAA >60  --  >60 >60  ANIONGAP 8  --  7 7    Hematology Recent Labs  Lab 10/28/20 0610  10/28/20 0704  WBC 8.0  --   RBC 3.28*  --   HGB 10.2* 10.5*  HCT 32.9* 31.0*  MCV 100.3*  --   MCH 31.1  --   MCHC 31.0  --   RDW 15.9*  --   PLT 223  --    BNP Recent Labs  Lab 10/28/20 0638  BNP 498.5*    DDimer No results for input(s): DDIMER in the last 168 hours.   Radiology  ECHOCARDIOGRAM LIMITED  Result Date: 10/29/2020    ECHOCARDIOGRAM LIMITED REPORT   Patient Name:   Ruben [name] Date of Exam: 10/29/2020 Medical Rec #:  [id number]    Height:       67.0 in Accession #:    [id number]   Weight:       194.9 lb Date of Birth:  Nov 09, 1926     BSA:          2.000 m Patient Age:    87 years     BP:           124/83 mmHg Patient Gender: M            HR:           89 bpm. Exam Location:  Inpatient Procedure: Limited Echo, Cardiac Doppler and Color Doppler Indications:    acute diastolic chf  History:        Patient has prior history of Echocardiogram examinations, most                 recent 09/07/2020. COPD, Aortic Valve Disease; Arrythmias:Atrial                 Fibrillation.  Sonographer:    Delcie Roch Referring Phys: [id number] [place] THOMAS O'NEAL IMPRESSIONS  1. Left ventricular ejection fraction, by estimation, is 60 to 65%. The left ventricle has normal function. There is moderate concentric left ventricular hypertrophy. There is the interventricular septum is flattened in systole and diastole, consistent with right ventricular pressure and volume overload.  2. Right ventricular systolic function is moderately reduced. The right ventricular size  is severely enlarged.  3. Moderate to severe mitral regurgitation is present. There is splay artifact. Refer to recent TEE for characterization. The mitral valve is degenerative. Moderate to severe mitral valve regurgitation. Severe mitral annular calcification.  4. The aortic valve is tricuspid. There is severe calcifcation of the aortic valve. There is severe thickening of the aortic valve. Severe aortic valve stenosis. Aortic valve area, by  VTI measures 0.37 cm. Aortic valve mean gradient measures 36.0 mmHg.  Aortic valve Vmax measures 3.91 m/s. Comparison(s): No significant change from prior study. FINDINGS  Left Ventricle: Left ventricular ejection fraction, by estimation, is 60 to 65%. The left ventricle has normal function. The left ventricular internal cavity size was small. There is moderate concentric left ventricular hypertrophy. The interventricular  septum is flattened in systole and diastole, consistent with right ventricular pressure and volume overload. Right Ventricle: The right ventricular size is severely enlarged. No increase in right ventricular wall thickness. Right ventricular systolic function is moderately reduced. Pericardium: Trivial pericardial effusion is present. Mitral Valve: Moderate to severe mitral regurgitation is present. There is splay artifact. Refer to recent TEE for characterization. The mitral valve is degenerative in appearance. Severe mitral annular calcification. Moderate to severe mitral valve regurgitation. Tricuspid Valve: The tricuspid valve is grossly normal. Tricuspid valve regurgitation is mild . No evidence of tricuspid stenosis. Aortic Valve: The aortic valve is tricuspid. There is severe calcifcation of the aortic valve. There is severe thickening of the aortic valve. Severe aortic stenosis is present. Aortic valve mean gradient measures 36.0 mmHg. Aortic valve peak gradient measures 61.3 mmHg. Aortic valve area, by VTI measures 0.37 cm. Pulmonic Valve: The pulmonic valve was grossly normal. Pulmonic valve regurgitation is mild. No evidence of pulmonic stenosis. LEFT VENTRICLE PLAX 2D LVIDd:         4.70 cm LVIDs:         2.80 cm LV PW:         1.40 cm LV IVS:        1.30 cm LVOT diam:     1.80 cm LV SV:         32 LV SV Index:   16 LVOT Area:     2.54 cm  AORTIC VALVE AV Area (Vmax):    0.41 cm AV Area (Vmean):   0.39 cm AV Area (VTI):     0.37 cm AV Vmax:           391.33 cm/s AV Vmean:           278.333 cm/s AV VTI:            0.856 m AV Peak Grad:      61.3 mmHg AV Mean Grad:      36.0 mmHg LVOT Vmax:         62.30 cm/s LVOT Vmean:        42.175 cm/s LVOT VTI:          0.124 m LVOT/AV VTI ratio: 0.15 TRICUSPID VALVE TR Peak grad:   31.4 mmHg TR Vmax:        280.00 cm/s  SHUNTS Systemic VTI:  0.12 m Systemic Diam: 1.80 cm Lennie Odor MD Electronically signed by Lennie Odor MD Signature Date/Time: 10/29/2020/12:51:46 PM    Final     Cardiac Studies  TTE 10/29/2020 1. Left ventricular ejection fraction, by estimation, is 60 to 65%. The  left ventricle has normal function. There is moderate concentric left  ventricular hypertrophy. There is the interventricular septum is  flattened  in systole and diastole, consistent  with right ventricular pressure and volume overload.  2. Right ventricular systolic function is moderately reduced. The right  ventricular size is severely enlarged.  3. Moderate to severe mitral regurgitation is present. There is splay  artifact. Refer to recent TEE for characterization. The mitral valve is  degenerative. Moderate to severe mitral valve regurgitation. Severe mitral  annular calcification.  4. The aortic valve is tricuspid. There is severe calcifcation of the  aortic valve. There is severe thickening of the aortic valve. Severe  aortic valve stenosis. Aortic valve area, by VTI measures 0.37 cm. Aortic  valve mean gradient measures 36.0 mmHg.  Aortic valve Vmax measures 3.91 m/s.   Patient Profile  Ruben [name] is a 87 y.o. male with COPD on home 2 L, severe aortic stenosis, moderate to severe mitral vegetation, persistent atrial fibrillation on Coumadin, diastolic heart failure who was admitted on 10/28/2020 with acute on chronic diastolic heart failure.  Assessment & Plan   1.  Acute on chronic diastolic heart failure, EF 60-65% -No change in LV function.  RV function appears stable as well.  Still has severe aortic stenosis and severe  MR. -Weights are not accurate. -Continue Lasix 80 mg IV twice daily today. -He appears to be improving.  2.  Severe aortic stenosis/moderate to severe mitral regurgitation. -Transition to heparin drip.  Plans for TAVR this admission.  Structural heart follow-up tomorrow. -MR is not able to undergo mitral clip procedure.  3.  Permanent A. Fib -Continue metoprolol succinate -Transition to heparin.  INR is less than 2.  4.  COPD on home O2 -Continue home oxygen  FEN -No intravenous fluids -Diet: Salt restricted -Code: DNR -DVT PPx: Heparin drip   For questions or updates, please contact [facility] Please consult [email or web address] for contact info under   Time Spent with Patient: I have spent a total of 25 minutes with patient reviewing hospital notes, telemetry, EKGs, labs and examining the patient as well as establishing an assessment and plan that was discussed with the patient.  > 50% of time was spent in direct patient care.    Signed, Lenna GilfordWesley T. Flora Lipps'Neal, MD, [facility] Health Psychiatric HospitalFACC [place]  [facility]  10/30/2020 9:37 AM

## 2020-10-31 ENCOUNTER — Inpatient Hospital Stay (HOSPITAL_COMMUNITY)
Admission: RE | Admit: 2020-10-31 | Discharge: 2020-10-31 | Disposition: A | Discharge status: Home / Self Care | Payer: Medicare Other | Source: Ambulatory Visit

## 2020-10-31 ENCOUNTER — Inpatient Hospital Stay (HOSPITAL_COMMUNITY): Payer: Medicare Other

## 2020-10-31 DIAGNOSIS — I350 Nonrheumatic aortic (valve) stenosis: Secondary | ICD-10-CM | POA: Diagnosis not present

## 2020-10-31 DIAGNOSIS — I5033 Acute on chronic diastolic (congestive) heart failure: Secondary | ICD-10-CM | POA: Diagnosis not present

## 2020-10-31 LAB — TYPE AND SCREEN
ABO/RH(D): O POS
Antibody Screen: NEGATIVE

## 2020-10-31 LAB — PROTIME-INR
INR: 1.5 — ABNORMAL HIGH (ref 0.8–1.2)
INR: 1.3 — ABNORMAL HIGH (ref 0.8–1.2)
INR: 1.3 — ABNORMAL HIGH (ref 0.8–1.2)
Prothrombin Time: 17.2 seconds — ABNORMAL HIGH (ref 11.4–15.2)
Prothrombin Time: 16 seconds — ABNORMAL HIGH (ref 11.4–15.2)
Prothrombin Time: 15.5 seconds — ABNORMAL HIGH (ref 11.4–15.2)

## 2020-10-31 LAB — CBC
HCT: 31.8 % — ABNORMAL LOW (ref 39.0–52.0)
HCT: 35.4 % — ABNORMAL LOW (ref 39.0–52.0)
Hemoglobin: 9.9 g/dL — ABNORMAL LOW (ref 13.0–17.0)
Hemoglobin: 11.5 g/dL — ABNORMAL LOW (ref 13.0–17.0)
MCH: 30.9 pg (ref 26.0–34.0)
MCH: 31.9 pg (ref 26.0–34.0)
MCHC: 31.1 g/dL (ref 30.0–36.0)
MCHC: 32.5 g/dL (ref 30.0–36.0)
MCV: 99.4 fL (ref 80.0–100.0)
MCV: 98.1 fL (ref 80.0–100.0)
Platelets: 237 10*3/uL (ref 150–400)
Platelets: 241 10*3/uL (ref 150–400)
RBC: 3.2 MIL/uL — ABNORMAL LOW (ref 4.22–5.81)
RBC: 3.61 MIL/uL — ABNORMAL LOW (ref 4.22–5.81)
RDW: 16.3 % — ABNORMAL HIGH (ref 11.5–15.5)
RDW: 16.3 % — ABNORMAL HIGH (ref 11.5–15.5)
WBC: 7.5 10*3/uL (ref 4.0–10.5)
WBC: 7.4 10*3/uL (ref 4.0–10.5)
nRBC: 0 % (ref 0.0–0.2)
nRBC: 0 % (ref 0.0–0.2)

## 2020-10-31 LAB — COMPREHENSIVE METABOLIC PANEL
ALT: 11 U/L (ref 0–44)
AST: 22 U/L (ref 15–41)
Albumin: 3.2 g/dL — ABNORMAL LOW (ref 3.5–5.0)
Alkaline Phosphatase: 122 U/L (ref 38–126)
Anion gap: 11 (ref 5–15)
BUN: 22 mg/dL (ref 8–23)
CO2: 39 mmol/L — ABNORMAL HIGH (ref 22–32)
Calcium: 9.1 mg/dL (ref 8.9–10.3)
Chloride: 90 mmol/L — ABNORMAL LOW (ref 98–111)
Creatinine, Ser: 1.01 mg/dL (ref 0.61–1.24)
GFR, Estimated: >60 mL/min (ref >60)
Glucose, Bld: 104 mg/dL — ABNORMAL HIGH (ref 70–99)
Potassium: 3.7 mmol/L (ref 3.5–5.1)
Sodium: 140 mmol/L (ref 135–145)
Total Bilirubin: 0.9 mg/dL (ref 0.3–1.2)
Total Protein: 7 g/dL (ref 6.5–8.1)

## 2020-10-31 LAB — APTT: aPTT: 125 seconds — ABNORMAL HIGH (ref 24–36)

## 2020-10-31 LAB — BLOOD GAS, ARTERIAL
Acid-Base Excess: 14.1 mmol/L — ABNORMAL HIGH (ref 0.0–2.0)
Bicarbonate: 38.9 mmol/L — ABNORMAL HIGH (ref 20.0–28.0)
Drawn by: 53527
FIO2: 44
O2 Saturation: 96.9 %
Patient temperature: 36.6
pCO2 arterial: 54.7 mmHg — ABNORMAL HIGH (ref 32.0–48.0)
pH, Arterial: 7.464 — ABNORMAL HIGH (ref 7.350–7.450)
pO2, Arterial: 84.4 mmHg (ref 83.0–108.0)

## 2020-10-31 LAB — URINALYSIS, ROUTINE W REFLEX MICROSCOPIC
Bacteria, UA: NONE SEEN
Bilirubin Urine: NEGATIVE
Bilirubin Urine: NEGATIVE
Glucose, UA: NEGATIVE mg/dL
Glucose, UA: NEGATIVE mg/dL
Hgb urine dipstick: NEGATIVE
Ketones, ur: NEGATIVE mg/dL
Ketones, ur: NEGATIVE mg/dL
Leukocytes,Ua: NEGATIVE
Leukocytes,Ua: NEGATIVE
Nitrite: NEGATIVE
Nitrite: NEGATIVE
Protein, ur: NEGATIVE mg/dL
Protein, ur: NEGATIVE mg/dL
Specific Gravity, Urine: 1.009 (ref 1.005–1.030)
Specific Gravity, Urine: 1.008 (ref 1.005–1.030)
pH: 8 (ref 5.0–8.0)
pH: 7 (ref 5.0–8.0)

## 2020-10-31 LAB — BASIC METABOLIC PANEL
Anion gap: 5 (ref 5–15)
BUN: 22 mg/dL (ref 8–23)
CO2: 43 mmol/L — ABNORMAL HIGH (ref 22–32)
Calcium: 8.7 mg/dL — ABNORMAL LOW (ref 8.9–10.3)
Chloride: 91 mmol/L — ABNORMAL LOW (ref 98–111)
Creatinine, Ser: 1.06 mg/dL (ref 0.61–1.24)
GFR, Estimated: >60 mL/min (ref >60)
Glucose, Bld: 104 mg/dL — ABNORMAL HIGH (ref 70–99)
Potassium: 3.8 mmol/L (ref 3.5–5.1)
Sodium: 139 mmol/L (ref 135–145)

## 2020-10-31 LAB — HEPARIN LEVEL (UNFRACTIONATED)
Heparin Unfractionated: 0.25 IU/mL — ABNORMAL LOW (ref 0.30–0.70)
Heparin Unfractionated: 0.46 IU/mL (ref 0.30–0.70)

## 2020-10-31 LAB — MRSA PCR SCREENING: MRSA by PCR: NEGATIVE

## 2020-10-31 MED ORDER — DOXYCYCLINE HYCLATE 100 MG PO TABS
100.0000 mg | ORAL_TABLET | Freq: Two times a day (BID) | ORAL | Status: DC
  Administered 2020-10-31 – 2020-11-05 (×10): 100 mg via ORAL
  Filled 2020-10-31 (×10): qty 1

## 2020-10-31 MED ORDER — CHLORHEXIDINE GLUCONATE 4 % EX LIQD
60.0000 mL | Freq: Once | CUTANEOUS | Status: AC
  Administered 2020-10-31: 4 via TOPICAL
  Filled 2020-10-31: qty 60

## 2020-10-31 MED ORDER — NOREPINEPHRINE 4 MG/250ML-% IV SOLN
0.0000 ug/min | INTRAVENOUS | Status: AC
  Administered 2020-11-01: 1 ug/min via INTRAVENOUS
  Filled 2020-10-31: qty 250

## 2020-10-31 MED ORDER — CHLORHEXIDINE GLUCONATE 4 % EX LIQD
1.0000 "application " | Freq: Once | CUTANEOUS | Status: AC
  Filled 2020-10-31: qty 15

## 2020-10-31 MED ORDER — IPRATROPIUM-ALBUTEROL 0.5-2.5 (3) MG/3ML IN SOLN
3.0000 mL | Freq: Two times a day (BID) | RESPIRATORY_TRACT | Status: DC
  Administered 2020-10-31 – 2020-11-02 (×4): 3 mL via RESPIRATORY_TRACT
  Filled 2020-10-31 (×4): qty 3

## 2020-10-31 MED ORDER — TEMAZEPAM 15 MG PO CAPS
15.0000 mg | ORAL_CAPSULE | Freq: Once | ORAL | Status: AC | PRN
  Administered 2020-11-01: 15 mg via ORAL
  Filled 2020-10-31: qty 1

## 2020-10-31 MED ORDER — CHLORHEXIDINE GLUCONATE 4 % EX LIQD
60.0000 mL | Freq: Once | CUTANEOUS | Status: AC
  Administered 2020-10-31: 4 via TOPICAL
  Filled 2020-10-31: qty 15

## 2020-10-31 MED ORDER — CHLORHEXIDINE GLUCONATE 4 % EX LIQD
30.0000 mL | CUTANEOUS | Status: AC
  Administered 2020-10-31 – 2020-11-01 (×2): 2 via TOPICAL
  Filled 2020-10-31: qty 60

## 2020-10-31 MED ORDER — SODIUM CHLORIDE 0.9 % IV SOLN
1.5000 g | INTRAVENOUS | Status: AC
  Administered 2020-11-01: 1.5 g via INTRAVENOUS
  Filled 2020-10-31: qty 1.5

## 2020-10-31 MED ORDER — POTASSIUM CHLORIDE 2 MEQ/ML IV SOLN
80.0000 meq | INTRAVENOUS | Status: DC
  Filled 2020-10-31: qty 40

## 2020-10-31 MED ORDER — CHLORHEXIDINE GLUCONATE 0.12 % MT SOLN
15.0000 mL | Freq: Once | OROMUCOSAL | Status: AC
  Administered 2020-11-01: 15 mL via OROMUCOSAL
  Filled 2020-10-31: qty 15

## 2020-10-31 MED ORDER — MAGNESIUM SULFATE 50 % IJ SOLN
40.0000 meq | INTRAMUSCULAR | Status: DC
  Filled 2020-10-31: qty 9.85

## 2020-10-31 MED ORDER — SODIUM CHLORIDE 0.9 % IV SOLN: INTRAVENOUS | Status: DC

## 2020-10-31 MED ORDER — POTASSIUM CHLORIDE CRYS ER 20 MEQ PO TBCR: 40.0000 meq | EXTENDED_RELEASE_TABLET | Freq: Every day | ORAL | Status: DC

## 2020-10-31 MED ORDER — SODIUM CHLORIDE 0.9 % IV SOLN
INTRAVENOUS | Status: DC
  Filled 2020-10-31: qty 30

## 2020-10-31 MED ORDER — VANCOMYCIN HCL 1250 MG/250ML IV SOLN
1250.0000 mg | INTRAVENOUS | Status: DC
  Filled 2020-10-31: qty 250

## 2020-10-31 MED ORDER — VANCOMYCIN HCL 1500 MG/300ML IV SOLN
1500.0000 mg | INTRAVENOUS | Status: AC
  Administered 2020-11-01: 1500 mg via INTRAVENOUS
  Filled 2020-10-31 (×2): qty 300

## 2020-10-31 MED ORDER — CHLORHEXIDINE GLUCONATE 0.12 % MT SOLN
15.0000 mL | Freq: Once | OROMUCOSAL | Status: AC
  Administered 2020-10-31: 15 mL via OROMUCOSAL
  Filled 2020-10-31: qty 15

## 2020-10-31 MED ORDER — BISACODYL 5 MG PO TBEC
5.0000 mg | DELAYED_RELEASE_TABLET | Freq: Once | ORAL | Status: AC
  Administered 2020-10-31: 5 mg via ORAL
  Filled 2020-10-31: qty 1

## 2020-10-31 MED ORDER — DEXMEDETOMIDINE HCL IN NACL 400 MCG/100ML IV SOLN
0.1000 ug/kg/h | INTRAVENOUS | Status: AC
  Administered 2020-11-01: 1 ug/kg/h via INTRAVENOUS
  Filled 2020-10-31: qty 100

## 2020-10-31 NOTE — Progress Notes (Addendum)
[facility]   MULTIDISCIPLINARY HEART VALVE TEAM  Patient Name: Ruben [name] Date of Encounter: 10/31/2020  Primary Cardiologist: Dr. O'Neal  Hospital Problem List     Principal Problem:   Acute on chronic diastolic (congestive) heart failure (HCC) Active Problems:   Atrial fibrillation, chronic (HCC)   Severe aortic stenosis   COPD (chronic obstructive pulmonary disease) (HCC)   Mitral regurgitation     Subjective   Feeling much better . Anxious for TAVR tomorrow.    Inpatient Medications    Scheduled Meds: . chlorhexidine  30 mL Topical UD  . chlorhexidine  60 mL Topical Once   And  . [START ON 11/01/2020] chlorhexidine  60 mL Topical Once  . [START ON 11/01/2020] chlorhexidine  15 mL Mouth/Throat Once  . dutasteride  0.5 mg Oral Daily  . ferrous gluconate  324 mg Oral QODAY  . ipratropium-albuterol  3 mL Nebulization BID  . metoprolol succinate  50 mg Oral Daily  . montelukast  10 mg Oral QHS  . potassium chloride  40 mEq Oral BID  . senna-docusate  2 tablet Oral Daily  . sodium chloride flush  3 mL Intravenous Q12H  . tamsulosin  0.8 mg Oral QHS  . umeclidinium bromide  1 puff Inhalation Daily  . vitamin B-12  500 mcg Oral Daily   Continuous Infusions: . sodium chloride    . [START ON 11/01/2020] sodium chloride    . furosemide 120 mg (10/31/20 0910)  . heparin 1,400 Units/hr (10/31/20 0455)   PRN Meds: sodium chloride, acetaminophen, diclofenac Sodium, ondansetron (ZOFRAN) IV, polyethylene glycol, sodium chloride flush   Vital Signs    Vitals:   10/31/20 0452 10/31/20 0710 10/31/20 0717 10/31/20 0718  BP: 132/63 108/75    Pulse: 98 89    Resp: (!) 21 17    Temp: (!) 97.3 F (36.3 C) 97.9 F (36.6 C)    TempSrc: Oral Oral    SpO2: 100% 99% 99% 100%  Weight: 86.5 kg     Height:        Intake/Output Summary (Last 24 hours) at 10/31/2020 0913 Last data filed at 10/31/2020 0900 Gross per 24 hour  Intake 1607.8 ml  Output 2800 ml   Net -1192.2 ml   Filed Weights   10/29/20 0352 10/30/20 0329 10/31/20 0452  Weight: 88.4 kg 88.6 kg 86.5 kg    Physical Exam    GEN: Well nourished, well developed, in no acute distress.  HEENT: Grossly normal.  Neck: Supple, no JVD, or masses. Cardiac: RRR, 3/6 SEM @ RUSB, 2/6 holosystolic murmur at apex. No rubs, or gallops. No clubbing, cyanosis, resolved LE edema.  Respiratory:  Respirations regular and unlabored, clear to auscultation bilaterally. GI: Soft, nontender, nondistended, BS + x 4. MS: no deformity or atrophy. Skin: warm and dry, no rash. Dime sized ulcer on left heel with mild surrounding erythema.  Neuro:  Strength and sensation are intact. Psych: AAOx3.  Normal affect.  Labs    CBC Recent Labs    10/31/20 0305  WBC 7.5  HGB 9.9*  HCT 31.8*  MCV 99.4  PLT 237   Basic Metabolic Panel Recent Labs    10/30/20 0306 10/31/20 0305  NA 138 139  K 3.7 3.8  CL 95* 91*  CO2 36* 43*  GLUCOSE 104* 104*  BUN 16 22  CREATININE 0.85 1.06  CALCIUM 8.3* 8.7*   Liver Function Tests No results for input(s): AST, ALT, ALKPHOS, BILITOT, PROT, ALBUMIN in   the last 72 hours. No results for input(s): LIPASE, AMYLASE in the last 72 hours. Cardiac Enzymes No results for input(s): CKTOTAL, CKMB, CKMBINDEX, TROPONINI in the last 72 hours. BNP Invalid input(s): POCBNP D-Dimer No results for input(s): DDIMER in the last 72 hours. Hemoglobin A1C No results for input(s): HGBA1C in the last 72 hours. Fasting Lipid Panel No results for input(s): CHOL, HDL, LDLCALC, TRIG, CHOLHDL, LDLDIRECT in the last 72 hours. Thyroid Function Tests No results for input(s): TSH, T4TOTAL, T3FREE, THYROIDAB in the last 72 hours.  Invalid input(s): FREET3  Telemetry    afib with PVCs, rates overall well controlled - Personally Reviewed  ECG    afib with RBBB and PVC - Personally Reviewed  Radiology    DG Chest 2 View  Result Date: 10/31/2020 CLINICAL DATA:  Shortness of  breath, severe aortic stenosis EXAM: CHEST - 2 VIEW COMPARISON:  Portable exam of 10/28/2020 FINDINGS: Enlargement of cardiac silhouette with pulmonary vascular congestion. Atherosclerotic calcification aorta. Scattered pulmonary infiltrates likely representing pulmonary edema. Small bibasilar pleural effusions and adjacent basilar atelectasis. No pneumothorax. IMPRESSION: Enlargement of cardiac silhouette with pulmonary vascular congestion and BILATERAL pulmonary infiltrates consistent with pulmonary edema. Bibasilar pleural effusions and atelectasis. Aortic Atherosclerosis (ICD10-I70.0). Electronically Signed   By: Ulyses Southward M.D.   On: 10/31/2020 08:29   ECHOCARDIOGRAM LIMITED  Result Date: 10/29/2020    ECHOCARDIOGRAM LIMITED REPORT   Patient Name:   Ruben [name] Date of Exam: 10/29/2020 Medical Rec #:  [id number]    Height:       67.0 in Accession #:    [id number]   Weight:       194.9 lb Date of Birth:  09-02-1926     BSA:          2.000 m Patient Age:    87 years     BP:           124/83 mmHg Patient Gender: M            HR:           89 bpm. Exam Location:  Inpatient Procedure: Limited Echo, Cardiac Doppler and Color Doppler Indications:    acute diastolic chf  History:        Patient has prior history of Echocardiogram examinations, most                 recent 09/07/2020. COPD, Aortic Valve Disease; Arrythmias:Atrial                 Fibrillation.  Sonographer:    Delcie Roch Referring Phys: [id number] [place] THOMAS O'NEAL IMPRESSIONS  1. Left ventricular ejection fraction, by estimation, is 60 to 65%. The left ventricle has normal function. There is moderate concentric left ventricular hypertrophy. There is the interventricular septum is flattened in systole and diastole, consistent with right ventricular pressure and volume overload.  2. Right ventricular systolic function is moderately reduced. The right ventricular size is severely enlarged.  3. Moderate to severe mitral regurgitation is present.  There is splay artifact. Refer to recent TEE for characterization. The mitral valve is degenerative. Moderate to severe mitral valve regurgitation. Severe mitral annular calcification.  4. The aortic valve is tricuspid. There is severe calcifcation of the aortic valve. There is severe thickening of the aortic valve. Severe aortic valve stenosis. Aortic valve area, by VTI measures 0.37 cm. Aortic valve mean gradient measures 36.0 mmHg.  Aortic valve Vmax measures 3.91 m/s. Comparison(s): No significant change from prior study. FINDINGS  Left Ventricle: Left ventricular ejection fraction, by estimation, is 60 to 65%. The left ventricle has normal function. The left ventricular internal cavity size was small. There is moderate concentric left ventricular hypertrophy. The interventricular  septum is flattened in systole and diastole, consistent with right ventricular pressure and volume overload. Right Ventricle: The right ventricular size is severely enlarged. No increase in right ventricular wall thickness. Right ventricular systolic function is moderately reduced. Pericardium: Trivial pericardial effusion is present. Mitral Valve: Moderate to severe mitral regurgitation is present. There is splay artifact. Refer to recent TEE for characterization. The mitral valve is degenerative in appearance. Severe mitral annular calcification. Moderate to severe mitral valve regurgitation. Tricuspid Valve: The tricuspid valve is grossly normal. Tricuspid valve regurgitation is mild . No evidence of tricuspid stenosis. Aortic Valve: The aortic valve is tricuspid. There is severe calcifcation of the aortic valve. There is severe thickening of the aortic valve. Severe aortic stenosis is present. Aortic valve mean gradient measures 36.0 mmHg. Aortic valve peak gradient measures 61.3 mmHg. Aortic valve area, by VTI measures 0.37 cm. Pulmonic Valve: The pulmonic valve was grossly normal. Pulmonic valve regurgitation is mild. No  evidence of pulmonic stenosis. LEFT VENTRICLE PLAX 2D LVIDd:         4.70 cm LVIDs:         2.80 cm LV PW:         1.40 cm LV IVS:        1.30 cm LVOT diam:     1.80 cm LV SV:         32 LV SV Index:   16 LVOT Area:     2.54 cm  AORTIC VALVE AV Area (Vmax):    0.41 cm AV Area (Vmean):   0.39 cm AV Area (VTI):     0.37 cm AV Vmax:           391.33 cm/s AV Vmean:          278.333 cm/s AV VTI:            0.856 m AV Peak Grad:      61.3 mmHg AV Mean Grad:      36.0 mmHg LVOT Vmax:         62.30 cm/s LVOT Vmean:        42.175 cm/s LVOT VTI:          0.124 m LVOT/AV VTI ratio: 0.15 TRICUSPID VALVE TR Peak grad:   31.4 mmHg TR Vmax:        280.00 cm/s  SHUNTS Systemic VTI:  0.12 m Systemic Diam: 1.80 cm Lennie Odor MD Electronically signed by Lennie Odor MD Signature Date/Time: 10/29/2020/12:51:46 PM    Final     Cardiac Studies   Echo 10/29/2020 IMPRESSIONS  1. Left ventricular ejection fraction, by estimation, is 60 to 65%. The  left ventricle has normal function. There is moderate concentric left  ventricular hypertrophy. There is the interventricular septum is flattened  in systole and diastole, consistent  with right ventricular pressure and volume overload.  2. Right ventricular systolic function is moderately reduced. The right  ventricular size is severely enlarged.  3. Moderate to severe mitral regurgitation is present. There is splay  artifact. Refer to recent TEE for characterization. The mitral valve is  degenerative. Moderate to severe mitral valve regurgitation. Severe mitral  annular calcification.  4. The aortic valve is tricuspid. There is severe calcifcation of the  aortic valve. There is severe thickening of the aortic valve.  Severe  aortic valve stenosis. Aortic valve area, by VTI measures 0.37 cm. Aortic  valve mean gradient measures 36.0 mmHg.  Aortic valve Vmax measures 3.91 m/s.   Comparison(s): No significant change from prior study.   Patient Profile      Ruben [name] is a 87 y.o. male with RBBB, severe mitral regurgitation, HTN, COPDon home 02, longstanding persistent atrial fibrillation on warfarin, degenerative arthritis, severe aortic stenosiswith plans for TAVR, and recent mechanical fall with hip fracture s/p ORIFand admission for acute on chronic diastolic CHF who was admitted on 10/28/20 with recurrent heart failure.   Assessment & Plan    Acute on chronic diastolic CHF: pt with very difficult to control heart failure requiring multiple admissions. Multifactorial due to diastolic dysfunction, severe AS and moderate to severe MR. Pt has declined significantly since recent hip fracture. Discussed tenuous status with son and that we are hopeful TAVR will make his heart failure easier to control and lessen severity of MR. He has been diuresing well with IV lasix. Weight down to 190 today, which is his reported baseline. Legs look much better. Currently on 120mg  IV lasix. Will stop IV lasix now and restart oral after TAVR.   Severe AS: we are planning for inpatient TAVR tomorrow with Dr. and Dr. Excell Seltzer. Orders are placed.   Chronic afib: rate has been well controlled. Coumadin held with plans for TAVR and transitioned to IV heparin.   Moderate to severe MR: we are hopeful this will improve with TAVR. Anatomy not amendable for TEER.   COPD: continued on home inhalers.   Left heel ulcer: this is chronic and has been bandaged. It does look a little more erythematous on exam and pt admits to it being a bit more painful. Will start him on oral doxycycline and continue it after surgery.   Hip fracture s/p ORIF:this has significantly set him back. He has made slow progress with rehabilitation. Will plan to discharge back [facility] for continued rehab at discharge. Facility is aware and will hold his bed.   [facility], PA-C  10/31/2020, 9:13 AM  Pager [phone]  Patient seen, examined. Available data reviewed. Agree  with findings, assessment, and plan as outlined by [id number], PA-C.  The patient is independently interviewed and examined.  He is sitting up in the chair at the bedside.  JVP is normal, lung fields are diminished in the bases but otherwise clear, heart is irregularly irregular with 3/6 crescendo decrescendo murmur best heard at the right upper sternal border, extremities show trace pretibial edema.  All available data has been reviewed.  The patient has struggled since he had a mechanical fall and fractured his hip requiring surgery.  He had postoperative volume overload and congestive heart failure.  I think he has been optimized as much as possible with good diuresis and improvement in his volume status.  He remains at high risk of recurrent or progressive heart failure in the setting of severe aortic stenosis.  We plan to proceed with TAVR tomorrow as outlined above.  He has suitable anatomy for a 26 mm SAPIEN 3 valve via transfemoral access.  Carlean Jews, M.D. 10/31/2020 5:51 PM

## 2020-10-31 NOTE — Progress Notes (Signed)
ANTICOAGULATION CONSULT NOTE  Pharmacy Consult for Heparin Indication: atrial fibrillation  No Known Allergies  Patient Measurements: Height: 5\' 6"  (167.6 cm) Weight: 86.5 kg (190 lb 9.6 oz) (scale a) IBW/kg (Calculated) : 63.8 Heparin Dosing Weight: 80 kg  Vital Signs: Temp: 97.3 F (36.3 C) (03/28 1214) Temp Source: Oral (03/28 1214) BP: 91/61 (03/28 1214) Pulse Rate: 79 (03/28 1214)  Labs: Recent Labs    10/30/20 0306 10/30/20 1616 10/31/20 0305 10/31/20 0958 10/31/20 1259  HGB  --   --  9.9* 11.5*  --   HCT  --   --  31.8* 35.4*  --   PLT  --   --  237 241  --   APTT  --   --   --  125*  --   LABPROT 18.5*  --  17.2* 16.0*  --   INR 1.6*  --  1.5* 1.3*  --   HEPARINUNFRC  --  0.14* 0.25*  --  0.46  CREATININE 0.85  --  1.06 1.01  --     Estimated Creatinine Clearance: 49.1 mL/min (by C-G formula based on SCr of 1.01 mg/dL).   Medical History: Past Medical History:  Diagnosis Date  . Arthritis   . CHF (congestive heart failure) (HCC)   . COPD (chronic obstructive pulmonary disease) (HCC)   . Mitral regurgitation   . Persistent atrial fibrillation (HCC)    on coumadin   . RBBB   . Severe aortic stenosis     Medications:  Scheduled:  . chlorhexidine  30 mL Topical UD  . [START ON 11/01/2020] chlorhexidine  60 mL Topical Once  . [START ON 11/01/2020] chlorhexidine  15 mL Mouth/Throat Once  . [START ON 11/01/2020] chlorhexidine  15 mL Mouth/Throat Once  . doxycycline  100 mg Oral Q12H  . dutasteride  0.5 mg Oral Daily  . ferrous gluconate  324 mg Oral QODAY  . ipratropium-albuterol  3 mL Nebulization BID  . [START ON 11/01/2020] magnesium sulfate  40 mEq Other To OR  . metoprolol succinate  50 mg Oral Daily  . montelukast  10 mg Oral QHS  . [START ON 11/01/2020] potassium chloride  80 mEq Other To OR  . [START ON 11/01/2020] potassium chloride  40 mEq Oral Daily  . senna-docusate  2 tablet Oral Daily  . sodium chloride flush  3 mL Intravenous Q12H  .  tamsulosin  0.8 mg Oral QHS  . umeclidinium bromide  1 puff Inhalation Daily  . vitamin B-12  500 mcg Oral Daily    Assessment: 87 y.o. male with h/o Afib, Coumadin on hold while awaiting TAVR. INR down < 2, pharmacy consulted for heparin   HL 0.46 on 1400 units/hr. Planned TAVR tomorrow. H/H, plt stable.   Goal of Therapy:  Heparin level 0.3-0.7 units/ml Monitor platelets by anticoagulation protocol: Yes   Plan:  Continue heparin to 1400 units/hr Monitor daily HL, CBC/plt Monitor for signs/symptoms of bleeding  F/u restart warfarin after TAVR    82, PharmD, BCPS, BCCP Clinical Pharmacist  Please check AMION for all [facility] Pharmacy phone numbers After 10:00 PM, call Main Pharmacy [phone]

## 2020-10-31 NOTE — Progress Notes (Signed)
ANTICOAGULATION CONSULT NOTE  Pharmacy Consult for Heparin Indication: atrial fibrillation  No Known Allergies  Patient Measurements: Height: 5\' 7"  (170.2 cm) Weight: 88.6 kg (195 lb 5.2 oz) IBW/kg (Calculated) : 66.1 Heparin Dosing Weight: 80 kg  Vital Signs: Temp: 98.3 F (36.8 C) (03/28 0034) Temp Source: Oral (03/28 0034) BP: 105/71 (03/28 0034) Pulse Rate: 94 (03/27 1943)  Labs: Recent Labs    10/28/20 0610 10/28/20 0704 10/29/20 0157 10/30/20 0306 10/30/20 1616 10/31/20 0305  HGB 10.2* 10.5*  --   --   --  9.9*  HCT 32.9* 31.0*  --   --   --  31.8*  PLT 223  --   --   --   --  237  LABPROT 29.1*  --  24.0* 18.5*  --  17.2*  INR 2.9*  --  2.2* 1.6*  --  1.5*  HEPARINUNFRC  --   --   --   --  0.14* 0.25*  CREATININE 0.96  --  0.87 0.85  --  1.06    Estimated Creatinine Clearance: 48.2 mL/min (by C-G formula based on SCr of 1.06 mg/dL).   Medical History: Past Medical History:  Diagnosis Date  . Arthritis   . CHF (congestive heart failure) (HCC)   . COPD (chronic obstructive pulmonary disease) (HCC)   . Mitral regurgitation   . Persistent atrial fibrillation (HCC)    on coumadin   . RBBB   . Severe aortic stenosis     Medications:  Scheduled:  . dutasteride  0.5 mg Oral Daily  . ferrous gluconate  324 mg Oral QODAY  . ipratropium-albuterol  3 mL Nebulization TID  . metoprolol succinate  50 mg Oral Daily  . montelukast  10 mg Oral QHS  . potassium chloride  40 mEq Oral BID  . senna-docusate  2 tablet Oral Daily  . sodium chloride flush  3 mL Intravenous Q12H  . tamsulosin  0.8 mg Oral QHS  . umeclidinium bromide  1 puff Inhalation Daily  . vitamin B-12  500 mcg Oral Daily    Assessment: 87 y.o. male with h/o Afib, Coumadin on hold while awaiting TAVR and INR subtherapeutic on heparin at 1100 units/hr -heparin level= 0.14, INR= 1.6  3/28 AM update:  Heparin level low but trending up INR 1.5   Goal of Therapy:  Heparin level 0.3-0.7  units/ml Monitor platelets by anticoagulation protocol: Yes   Plan:  -Increase heparin to 1400 units/hr -Heparin level in 8 hours and daily wth CBC daily  4/28, PharmD, BCPS Clinical Pharmacist Phone: [phone]

## 2020-10-31 NOTE — Anesthesia Preprocedure Evaluation (Signed)
Anesthesia Evaluation    Reviewed: Allergy & Precautions, Patient's Chart, lab work & pertinent test results, Unable to perform ROS - Chart review only  Airway Mallampati: II  TM Distance: >3 FB Neck ROM: Full    Dental  (+) Dental Advisory Given, Partial Upper, Partial Lower, Missing   Pulmonary COPD,  COPD inhaler, former smoker,    Pulmonary exam normal breath sounds clear to auscultation       Cardiovascular hypertension, Pt. on home beta blockers +CHF  + dysrhythmias (RBBB) + Valvular Problems/Murmurs AS, AI and MR  Rhythm:Regular Rate:Normal + Systolic murmurs Echo 10/29/2020 1. Left ventricular ejection fraction, by estimation, is 60 to 65%. The left ventricle has normal function. There is moderate concentric left ventricular hypertrophy. There is the interventricular septum is flattened in systole and diastole, consistent with right ventricular pressure and volume overload.  2. Right ventricular systolic function is moderately reduced. The right ventricular size is severely enlarged.  3. Moderate to severe mitral regurgitation is present. There is splay artifact. Refer to recent TEE for characterization. The mitral valve is degenerative. Moderate to severe mitral valve regurgitation. Severe mitral annular calcification.  4. The aortic valve is tricuspid. There is severe calcifcation of the aortic valve. There is severe thickening of the aortic valve. Severe aortic valve stenosis. Aortic valve area, by VTI measures 0.37 cm. Aortic valve mean gradient measures 36.0 mmHg.  Aortic valve Vmax measures 3.91 m/s.    Neuro/Psych negative neurological ROS  negative psych ROS   GI/Hepatic negative GI ROS, Neg liver ROS,   Endo/Other  Obesity   Renal/GU negative Renal ROS     Musculoskeletal  (+) Arthritis , Left hip fracture    Abdominal   Peds  Hematology  (+) Blood dyscrasia (Warfarin), anemia , Plt 179k   Anesthesia  Other Findings Day of surgery medications reviewed with the patient.  Reproductive/Obstetrics                             Anesthesia Physical  Anesthesia Plan  ASA: IV  Anesthesia Plan: General   Post-op Pain Management:    Induction: Intravenous  PONV Risk Score and Plan: 1 and Dexamethasone, Ondansetron, Propofol infusion and Treatment may vary due to age or medical condition  Airway Management Planned:   Additional Equipment: Arterial line  Intra-op Plan:   Post-operative Plan:   Informed Consent:   Plan Discussed with:   Anesthesia Plan Comments:         Anesthesia Quick Evaluation

## 2020-10-31 NOTE — Progress Notes (Signed)
RT NOTE: RT attempted to obtain ABG but patient was off the floor. RT will return to attempt again soon. RN aware.

## 2020-10-31 NOTE — Progress Notes (Signed)
  Mobility Specialist Criteria Algorithm Info.  Mobility Team:  HOB elevated: Activity: Ambulated in room; Ambulated in hall (in chair before and after ambulation) Range of motion: Active; All extremities Level of assistance: Contact guard assist, steadying assist Assistive device: Front wheel walker Minutes sitting in chair: 120 minutes Minutes stood: 5 minutes Minutes ambulated: 5 minutes Distance ambulated (ft): 40 ft Mobility response: Tolerated well Bed Position: Chair  Patient received in recliner chair willing to participate in mobility this morning. Reports feeling better but still has some pain in LLE. He stood from the recliner at min guard and ambulated in room/hall 40 feet with steady step-to gait. Distance limited secondary to pain, also required standing rest break x2 due to dyspnea. Unable to get good SPO2 reading on monitor. Denied any dizziness and or lightheadedness. Tolerated ambulation well with complaints of pain in LLE as distance increased, is now sitting in recliner chair with all needs met and call bell within reach.    10/31/2020 2:31 PM

## 2020-10-31 NOTE — H&P (View-Only) (Signed)
[facility]   MULTIDISCIPLINARY HEART VALVE TEAM  Patient Name: Ruben [name] Date of Encounter: 10/31/2020  Primary Cardiologist: Dr. Joylene Draft'Neal  Hospital Problem List     Principal Problem:   Acute on chronic diastolic (congestive) heart failure [facility](HCC) Active Problems:   Atrial fibrillation, chronic (HCC)   Severe aortic stenosis   COPD (chronic obstructive pulmonary disease) (HCC)   Mitral regurgitation     Subjective   Feeling much better . Anxious for TAVR tomorrow.    Inpatient Medications    Scheduled Meds: . chlorhexidine  30 mL Topical UD  . chlorhexidine  60 mL Topical Once   And  . [START ON 11/01/2020] chlorhexidine  60 mL Topical Once  . [START ON 11/01/2020] chlorhexidine  15 mL Mouth/Throat Once  . dutasteride  0.5 mg Oral Daily  . ferrous gluconate  324 mg Oral QODAY  . ipratropium-albuterol  3 mL Nebulization BID  . metoprolol succinate  50 mg Oral Daily  . montelukast  10 mg Oral QHS  . potassium chloride  40 mEq Oral BID  . senna-docusate  2 tablet Oral Daily  . sodium chloride flush  3 mL Intravenous Q12H  . tamsulosin  0.8 mg Oral QHS  . umeclidinium bromide  1 puff Inhalation Daily  . vitamin B-12  500 mcg Oral Daily   Continuous Infusions: . sodium chloride    . [START ON 11/01/2020] sodium chloride    . furosemide 120 mg (10/31/20 0910)  . heparin 1,400 Units/hr (10/31/20 0455)   PRN Meds: sodium chloride, acetaminophen, diclofenac Sodium, ondansetron (ZOFRAN) IV, polyethylene glycol, sodium chloride flush   Vital Signs    Vitals:   10/31/20 0452 10/31/20 0710 10/31/20 0717 10/31/20 0718  BP: 132/63 108/75    Pulse: 98 89    Resp: (!) 21 17    Temp: (!) 97.3 F (36.3 C) 97.9 F (36.6 C)    TempSrc: Oral Oral    SpO2: 100% 99% 99% 100%  Weight: 86.5 kg     Height:        Intake/Output Summary (Last 24 hours) at 10/31/2020 0913 Last data filed at 10/31/2020 0900 Gross per 24 hour  Intake 1607.8 ml  Output 2800 ml   Net -1192.2 ml   Filed Weights   10/29/20 0352 10/30/20 0329 10/31/20 0452  Weight: 88.4 kg 88.6 kg 86.5 kg    Physical Exam    GEN: Well nourished, well developed, in no acute distress.  HEENT: Grossly normal.  Neck: Supple, no JVD, or masses. Cardiac: RRR, 3/6 SEM @ RUSB, 2/6 holosystolic murmur at apex. No rubs, or gallops. No clubbing, cyanosis, resolved LE edema.  Respiratory:  Respirations regular and unlabored, clear to auscultation bilaterally. GI: Soft, nontender, nondistended, BS + x 4. MS: no deformity or atrophy. Skin: warm and dry, no rash. Dime sized ulcer on left heel with mild surrounding erythema.  Neuro:  Strength and sensation are intact. Psych: AAOx3.  Normal affect.  Labs    CBC Recent Labs    10/31/20 0305  WBC 7.5  HGB 9.9*  HCT 31.8*  MCV 99.4  PLT 237   Basic Metabolic Panel Recent Labs    16/05/9602/27/22 0306 10/31/20 0305  NA 138 139  K 3.7 3.8  CL 95* 91*  CO2 36* 43*  GLUCOSE 104* 104*  BUN 16 22  CREATININE 0.85 1.06  CALCIUM 8.3* 8.7*   Liver Function Tests No results for input(s): AST, ALT, ALKPHOS, BILITOT, PROT, ALBUMIN in  the last 72 hours. No results for input(s): LIPASE, AMYLASE in the last 72 hours. Cardiac Enzymes No results for input(s): CKTOTAL, CKMB, CKMBINDEX, TROPONINI in the last 72 hours. BNP Invalid input(s): POCBNP D-Dimer No results for input(s): DDIMER in the last 72 hours. Hemoglobin A1C No results for input(s): HGBA1C in the last 72 hours. Fasting Lipid Panel No results for input(s): CHOL, HDL, LDLCALC, TRIG, CHOLHDL, LDLDIRECT in the last 72 hours. Thyroid Function Tests No results for input(s): TSH, T4TOTAL, T3FREE, THYROIDAB in the last 72 hours.  Invalid input(s): FREET3  Telemetry    afib with PVCs, rates overall well controlled - Personally Reviewed  ECG    afib with RBBB and PVC - Personally Reviewed  Radiology    DG Chest 2 View  Result Date: 10/31/2020 CLINICAL DATA:  Shortness of  breath, severe aortic stenosis EXAM: CHEST - 2 VIEW COMPARISON:  Portable exam of 10/28/2020 FINDINGS: Enlargement of cardiac silhouette with pulmonary vascular congestion. Atherosclerotic calcification aorta. Scattered pulmonary infiltrates likely representing pulmonary edema. Small bibasilar pleural effusions and adjacent basilar atelectasis. No pneumothorax. IMPRESSION: Enlargement of cardiac silhouette with pulmonary vascular congestion and BILATERAL pulmonary infiltrates consistent with pulmonary edema. Bibasilar pleural effusions and atelectasis. Aortic Atherosclerosis (ICD10-I70.0). Electronically Signed   By: Ulyses Southward M.D.   On: 10/31/2020 08:29   ECHOCARDIOGRAM LIMITED  Result Date: 10/29/2020    ECHOCARDIOGRAM LIMITED REPORT   Patient Name:   Ruben [name] Date of Exam: 10/29/2020 Medical Rec #:  [id number]    Height:       67.0 in Accession #:    [id number]   Weight:       194.9 lb Date of Birth:  09-02-1926     BSA:          2.000 m Patient Age:    87 years     BP:           124/83 mmHg Patient Gender: M            HR:           89 bpm. Exam Location:  Inpatient Procedure: Limited Echo, Cardiac Doppler and Color Doppler Indications:    acute diastolic chf  History:        Patient has prior history of Echocardiogram examinations, most                 recent 09/07/2020. COPD, Aortic Valve Disease; Arrythmias:Atrial                 Fibrillation.  Sonographer:    Delcie Roch Referring Phys: [id number] McFall THOMAS O'NEAL IMPRESSIONS  1. Left ventricular ejection fraction, by estimation, is 60 to 65%. The left ventricle has normal function. There is moderate concentric left ventricular hypertrophy. There is the interventricular septum is flattened in systole and diastole, consistent with right ventricular pressure and volume overload.  2. Right ventricular systolic function is moderately reduced. The right ventricular size is severely enlarged.  3. Moderate to severe mitral regurgitation is present.  There is splay artifact. Refer to recent TEE for characterization. The mitral valve is degenerative. Moderate to severe mitral valve regurgitation. Severe mitral annular calcification.  4. The aortic valve is tricuspid. There is severe calcifcation of the aortic valve. There is severe thickening of the aortic valve. Severe aortic valve stenosis. Aortic valve area, by VTI measures 0.37 cm. Aortic valve mean gradient measures 36.0 mmHg.  Aortic valve Vmax measures 3.91 m/s. Comparison(s): No significant change from prior study. FINDINGS  Left Ventricle: Left ventricular ejection fraction, by estimation, is 60 to 65%. The left ventricle has normal function. The left ventricular internal cavity size was small. There is moderate concentric left ventricular hypertrophy. The interventricular  septum is flattened in systole and diastole, consistent with right ventricular pressure and volume overload. Right Ventricle: The right ventricular size is severely enlarged. No increase in right ventricular wall thickness. Right ventricular systolic function is moderately reduced. Pericardium: Trivial pericardial effusion is present. Mitral Valve: Moderate to severe mitral regurgitation is present. There is splay artifact. Refer to recent TEE for characterization. The mitral valve is degenerative in appearance. Severe mitral annular calcification. Moderate to severe mitral valve regurgitation. Tricuspid Valve: The tricuspid valve is grossly normal. Tricuspid valve regurgitation is mild . No evidence of tricuspid stenosis. Aortic Valve: The aortic valve is tricuspid. There is severe calcifcation of the aortic valve. There is severe thickening of the aortic valve. Severe aortic stenosis is present. Aortic valve mean gradient measures 36.0 mmHg. Aortic valve peak gradient measures 61.3 mmHg. Aortic valve area, by VTI measures 0.37 cm. Pulmonic Valve: The pulmonic valve was grossly normal. Pulmonic valve regurgitation is mild. No  evidence of pulmonic stenosis. LEFT VENTRICLE PLAX 2D LVIDd:         4.70 cm LVIDs:         2.80 cm LV PW:         1.40 cm LV IVS:        1.30 cm LVOT diam:     1.80 cm LV SV:         32 LV SV Index:   16 LVOT Area:     2.54 cm  AORTIC VALVE AV Area (Vmax):    0.41 cm AV Area (Vmean):   0.39 cm AV Area (VTI):     0.37 cm AV Vmax:           391.33 cm/s AV Vmean:          278.333 cm/s AV VTI:            0.856 m AV Peak Grad:      61.3 mmHg AV Mean Grad:      36.0 mmHg LVOT Vmax:         62.30 cm/s LVOT Vmean:        42.175 cm/s LVOT VTI:          0.124 m LVOT/AV VTI ratio: 0.15 TRICUSPID VALVE TR Peak grad:   31.4 mmHg TR Vmax:        280.00 cm/s  SHUNTS Systemic VTI:  0.12 m Systemic Diam: 1.80 cm Lennie Odor MD Electronically signed by Lennie Odor MD Signature Date/Time: 10/29/2020/12:51:46 PM    Final     Cardiac Studies   Echo 10/29/2020 IMPRESSIONS  1. Left ventricular ejection fraction, by estimation, is 60 to 65%. The  left ventricle has normal function. There is moderate concentric left  ventricular hypertrophy. There is the interventricular septum is flattened  in systole and diastole, consistent  with right ventricular pressure and volume overload.  2. Right ventricular systolic function is moderately reduced. The right  ventricular size is severely enlarged.  3. Moderate to severe mitral regurgitation is present. There is splay  artifact. Refer to recent TEE for characterization. The mitral valve is  degenerative. Moderate to severe mitral valve regurgitation. Severe mitral  annular calcification.  4. The aortic valve is tricuspid. There is severe calcifcation of the  aortic valve. There is severe thickening of the aortic valve.  Severe  aortic valve stenosis. Aortic valve area, by VTI measures 0.37 cm. Aortic  valve mean gradient measures 36.0 mmHg.  Aortic valve Vmax measures 3.91 m/s.   Comparison(s): No significant change from prior study.   Patient Profile      Ruben [name] is a 87 y.o. male with RBBB, severe mitral regurgitation, HTN, COPDon home 02, longstanding persistent atrial fibrillation on warfarin, degenerative arthritis, severe aortic stenosiswith plans for TAVR, and recent mechanical fall with hip fracture s/p ORIFand admission for acute on chronic diastolic CHF who was admitted on 10/28/20 with recurrent heart failure.   Assessment & Plan    Acute on chronic diastolic CHF: pt with very difficult to control heart failure requiring multiple admissions. Multifactorial due to diastolic dysfunction, severe AS and moderate to severe MR. Pt has declined significantly since recent hip fracture. Discussed tenuous status with son and that we are hopeful TAVR will make his heart failure easier to control and lessen severity of MR. He has been diuresing well with IV lasix. Weight down to 190 today, which is his reported baseline. Legs look much better. Currently on 120mg  IV lasix. Will stop IV lasix now and restart oral after TAVR.   Severe AS: we are planning for inpatient TAVR tomorrow with Dr. and Dr. Excell Seltzer. Orders are placed.   Chronic afib: rate has been well controlled. Coumadin held with plans for TAVR and transitioned to IV heparin.   Moderate to severe MR: we are hopeful this will improve with TAVR. Anatomy not amendable for TEER.   COPD: continued on home inhalers.   Left heel ulcer: this is chronic and has been bandaged. It does look a little more erythematous on exam and pt admits to it being a bit more painful. Will start him on oral doxycycline and continue it after surgery.   Hip fracture s/p ORIF:this has significantly set him back. He has made slow progress with rehabilitation. Will plan to [facility] back [facility] for continued rehab at discharge. Facility is aware and will hold his bed.   [facility], PA-C  10/31/2020, 9:13 AM  Pager [phone]  Patient seen, examined. Available data reviewed. Agree  with findings, assessment, and plan as outlined by [id number], PA-C.  The patient is independently interviewed and examined.  He is sitting up in the chair at the bedside.  JVP is normal, lung fields are diminished in the bases but otherwise clear, heart is irregularly irregular with 3/6 crescendo decrescendo murmur best heard at the right upper sternal border, extremities show trace pretibial edema.  All available data has been reviewed.  The patient has struggled since he had a mechanical fall and fractured his hip requiring surgery.  He had postoperative volume overload and congestive heart failure.  I think he has been optimized as much as possible with good diuresis and improvement in his volume status.  He remains at high risk of recurrent or progressive heart failure in the setting of severe aortic stenosis.  We plan to proceed with TAVR tomorrow as outlined above.  He has suitable anatomy for a 26 mm SAPIEN 3 valve via transfemoral access.  Carlean Jews, M.D. 10/31/2020 5:51 PM

## 2020-11-01 ENCOUNTER — Encounter (HOSPITAL_COMMUNITY): Payer: Self-pay | Admitting: Internal Medicine

## 2020-11-01 ENCOUNTER — Inpatient Hospital Stay (HOSPITAL_COMMUNITY): Payer: Medicare Other

## 2020-11-01 ENCOUNTER — Encounter (HOSPITAL_COMMUNITY)
Admission: EM | Disposition: A | Discharge status: Skilled Nursing Facility | Payer: Self-pay | Source: Skilled Nursing Facility | Attending: Cardiovascular Disease

## 2020-11-01 ENCOUNTER — Inpatient Hospital Stay (HOSPITAL_COMMUNITY): Payer: Medicare Other | Admitting: Anesthesiology

## 2020-11-01 ENCOUNTER — Other Ambulatory Visit: Payer: Self-pay | Admitting: Physician Assistant

## 2020-11-01 ENCOUNTER — Other Ambulatory Visit: Payer: Self-pay

## 2020-11-01 ENCOUNTER — Inpatient Hospital Stay (HOSPITAL_COMMUNITY): Admission: RE | Admit: 2020-11-01 | Payer: Medicare Other | Source: Home / Self Care | Admitting: Cardiovascular Disease

## 2020-11-01 ENCOUNTER — Other Ambulatory Visit (HOSPITAL_COMMUNITY): Payer: Medicare Other

## 2020-11-01 DIAGNOSIS — I083 Combined rheumatic disorders of mitral, aortic and tricuspid valves: Secondary | ICD-10-CM | POA: Diagnosis not present

## 2020-11-01 DIAGNOSIS — Z952 Presence of prosthetic heart valve: Secondary | ICD-10-CM

## 2020-11-01 DIAGNOSIS — I350 Nonrheumatic aortic (valve) stenosis: Secondary | ICD-10-CM | POA: Diagnosis not present

## 2020-11-01 DIAGNOSIS — Z006 Encounter for examination for normal comparison and control in clinical research program: Secondary | ICD-10-CM | POA: Diagnosis not present

## 2020-11-01 DIAGNOSIS — J9621 Acute and chronic respiratory failure with hypoxia: Secondary | ICD-10-CM | POA: Diagnosis not present

## 2020-11-01 DIAGNOSIS — I5033 Acute on chronic diastolic (congestive) heart failure: Secondary | ICD-10-CM | POA: Diagnosis not present

## 2020-11-01 HISTORY — PX: TRANSCATHETER AORTIC VALVE REPLACEMENT, TRANSFEMORAL: SHX6400

## 2020-11-01 HISTORY — DX: Presence of prosthetic heart valve: Z95.2

## 2020-11-01 LAB — POCT I-STAT, CHEM 8
BUN: 25 mg/dL — ABNORMAL HIGH (ref 8–23)
BUN: 23 mg/dL (ref 8–23)
Calcium, Ion: 1.16 mmol/L (ref 1.15–1.40)
Calcium, Ion: 1.16 mmol/L (ref 1.15–1.40)
Chloride: 89 mmol/L — ABNORMAL LOW (ref 98–111)
Chloride: 91 mmol/L — ABNORMAL LOW (ref 98–111)
Creatinine, Ser: 0.9 mg/dL (ref 0.61–1.24)
Creatinine, Ser: 0.8 mg/dL (ref 0.61–1.24)
Glucose, Bld: 113 mg/dL — ABNORMAL HIGH (ref 70–99)
Glucose, Bld: 114 mg/dL — ABNORMAL HIGH (ref 70–99)
HCT: 30 % — ABNORMAL LOW (ref 39.0–52.0)
HCT: 26 % — ABNORMAL LOW (ref 39.0–52.0)
Hemoglobin: 10.2 g/dL — ABNORMAL LOW (ref 13.0–17.0)
Hemoglobin: 8.8 g/dL — ABNORMAL LOW (ref 13.0–17.0)
Potassium: 3.7 mmol/L (ref 3.5–5.1)
Potassium: 3.9 mmol/L (ref 3.5–5.1)
Sodium: 138 mmol/L (ref 135–145)
Sodium: 138 mmol/L (ref 135–145)
TCO2: 37 mmol/L — ABNORMAL HIGH (ref 22–32)
TCO2: 36 mmol/L — ABNORMAL HIGH (ref 22–32)

## 2020-11-01 LAB — BASIC METABOLIC PANEL
Anion gap: 5 (ref 5–15)
Anion gap: 6 (ref 5–15)
BUN: 22 mg/dL (ref 8–23)
BUN: 20 mg/dL (ref 8–23)
CO2: 42 mmol/L — ABNORMAL HIGH (ref 22–32)
CO2: 37 mmol/L — ABNORMAL HIGH (ref 22–32)
Calcium: 8.3 mg/dL — ABNORMAL LOW (ref 8.9–10.3)
Calcium: 8.5 mg/dL — ABNORMAL LOW (ref 8.9–10.3)
Chloride: 89 mmol/L — ABNORMAL LOW (ref 98–111)
Chloride: 95 mmol/L — ABNORMAL LOW (ref 98–111)
Creatinine, Ser: 0.89 mg/dL (ref 0.61–1.24)
Creatinine, Ser: 0.79 mg/dL (ref 0.61–1.24)
GFR, Estimated: >60 mL/min (ref >60)
GFR, Estimated: >60 mL/min (ref >60)
Glucose, Bld: 97 mg/dL (ref 70–99)
Glucose, Bld: 79 mg/dL (ref 70–99)
Potassium: 3 mmol/L — ABNORMAL LOW (ref 3.5–5.1)
Potassium: 3.8 mmol/L (ref 3.5–5.1)
Sodium: 136 mmol/L (ref 135–145)
Sodium: 138 mmol/L (ref 135–145)

## 2020-11-01 LAB — HEMOGLOBIN A1C
Hgb A1c MFr Bld: 5.4 % (ref 4.8–5.6)
Mean Plasma Glucose: 108.28 mg/dL

## 2020-11-01 LAB — CBC
HCT: 28.7 % — ABNORMAL LOW (ref 39.0–52.0)
Hemoglobin: 8.9 g/dL — ABNORMAL LOW (ref 13.0–17.0)
MCH: 30.8 pg (ref 26.0–34.0)
MCHC: 31 g/dL (ref 30.0–36.0)
MCV: 99.3 fL (ref 80.0–100.0)
Platelets: 201 10*3/uL (ref 150–400)
RBC: 2.89 MIL/uL — ABNORMAL LOW (ref 4.22–5.81)
RDW: 16.4 % — ABNORMAL HIGH (ref 11.5–15.5)
WBC: 6.7 10*3/uL (ref 4.0–10.5)
nRBC: 0 % (ref 0.0–0.2)

## 2020-11-01 LAB — HEMOGLOBIN AND HEMATOCRIT, BLOOD
HCT: 29.1 % — ABNORMAL LOW (ref 39.0–52.0)
Hemoglobin: 9.3 g/dL — ABNORMAL LOW (ref 13.0–17.0)

## 2020-11-01 LAB — MAGNESIUM: Magnesium: 1.7 mg/dL (ref 1.7–2.4)

## 2020-11-01 LAB — ECHOCARDIOGRAM LIMITED
AR max vel: 1.53 cm2
AV Area VTI: 2.17 cm2
AV Area mean vel: 1.69 cm2
AV Mean grad: 4 mmHg
AV Peak grad: 7.6 mmHg
Ao pk vel: 1.38 m/s
Height: 66 in
Weight: 3030.4 oz

## 2020-11-01 LAB — PROTIME-INR
INR: 1.2 (ref 0.8–1.2)
Prothrombin Time: 15.2 seconds (ref 11.4–15.2)

## 2020-11-01 LAB — POCT ACTIVATED CLOTTING TIME: Activated Clotting Time: 350 seconds

## 2020-11-01 LAB — HEPARIN LEVEL (UNFRACTIONATED): Heparin Unfractionated: 0.51 IU/mL (ref 0.30–0.70)

## 2020-11-01 SURGERY — IMPLANTATION, AORTIC VALVE, TRANSCATHETER, FEMORAL APPROACH
Anesthesia: General

## 2020-11-01 MED ORDER — VANCOMYCIN HCL IN DEXTROSE 1-5 GM/200ML-% IV SOLN
1000.0000 mg | Freq: Once | INTRAVENOUS | Status: AC
  Administered 2020-11-01: 1000 mg via INTRAVENOUS
  Filled 2020-11-01 (×2): qty 200

## 2020-11-01 MED ORDER — SODIUM CHLORIDE 0.9% FLUSH
3.0000 mL | Freq: Two times a day (BID) | INTRAVENOUS | Status: DC
  Administered 2020-11-01 – 2020-11-05 (×7): 3 mL via INTRAVENOUS

## 2020-11-01 MED ORDER — SODIUM CHLORIDE 0.9 % IV SOLN
1.5000 g | Freq: Two times a day (BID) | INTRAVENOUS | Status: AC
  Administered 2020-11-01 – 2020-11-03 (×4): 1.5 g via INTRAVENOUS
  Filled 2020-11-01 (×4): qty 1.5

## 2020-11-01 MED ORDER — HEPARIN (PORCINE) IN NACL 1000-0.9 UT/500ML-% IV SOLN
INTRAVENOUS | Status: DC | PRN
  Administered 2020-11-01 (×2): 500 mL

## 2020-11-01 MED ORDER — HEPARIN SODIUM (PORCINE) 1000 UNIT/ML IJ SOLN
INTRAMUSCULAR | Status: DC | PRN
  Administered 2020-11-01: 13000 [IU] via INTRAVENOUS

## 2020-11-01 MED ORDER — FENTANYL CITRATE (PF) 100 MCG/2ML IJ SOLN
INTRAMUSCULAR | Status: DC | PRN
  Administered 2020-11-01: 25 ug via INTRAVENOUS

## 2020-11-01 MED ORDER — ACETAMINOPHEN 325 MG PO TABS
650.0000 mg | ORAL_TABLET | Freq: Four times a day (QID) | ORAL | Status: DC | PRN
  Administered 2020-11-01 – 2020-11-03 (×3): 650 mg via ORAL
  Filled 2020-11-01 (×3): qty 2

## 2020-11-01 MED ORDER — DEXMEDETOMIDINE HCL 200 MCG/2ML IV SOLN
INTRAVENOUS | Status: DC | PRN
  Administered 2020-11-01: 43 ug via INTRAVENOUS

## 2020-11-01 MED ORDER — ONDANSETRON HCL 4 MG/2ML IJ SOLN: 4.0000 mg | Freq: Four times a day (QID) | INTRAMUSCULAR | Status: DC | PRN

## 2020-11-01 MED ORDER — NITROGLYCERIN IN D5W 200-5 MCG/ML-% IV SOLN: 0.0000 ug/min | INTRAVENOUS | Status: DC

## 2020-11-01 MED ORDER — SODIUM CHLORIDE 0.9% FLUSH: 3.0000 mL | INTRAVENOUS | Status: DC | PRN

## 2020-11-01 MED ORDER — POTASSIUM CHLORIDE CRYS ER 20 MEQ PO TBCR
40.0000 meq | EXTENDED_RELEASE_TABLET | Freq: Once | ORAL | Status: AC
  Administered 2020-11-01: 40 meq via ORAL
  Filled 2020-11-01: qty 2

## 2020-11-01 MED ORDER — CHLORHEXIDINE GLUCONATE 0.12 % MT SOLN
OROMUCOSAL | Status: AC
  Filled 2020-11-01: qty 15

## 2020-11-01 MED ORDER — SODIUM CHLORIDE 0.9 % IV SOLN: 250.0000 mL | INTRAVENOUS | Status: DC | PRN

## 2020-11-01 MED ORDER — ASPIRIN 81 MG PO CHEW
81.0000 mg | CHEWABLE_TABLET | Freq: Every day | ORAL | Status: DC
Start: 2020-11-02 — End: 2020-11-05
  Administered 2020-11-02 – 2020-11-05 (×4): 81 mg via ORAL
  Filled 2020-11-01 (×4): qty 1

## 2020-11-01 MED ORDER — IOHEXOL 350 MG/ML SOLN
INTRAVENOUS | Status: DC | PRN
  Administered 2020-11-01: 40 mL

## 2020-11-01 MED ORDER — LIDOCAINE HCL (PF) 1 % IJ SOLN
INTRAMUSCULAR | Status: DC | PRN
  Administered 2020-11-01 (×2): 10 mL

## 2020-11-01 MED ORDER — TRAMADOL HCL 50 MG PO TABS
50.0000 mg | ORAL_TABLET | ORAL | Status: DC | PRN
  Administered 2020-11-02 – 2020-11-04 (×7): 100 mg via ORAL
  Filled 2020-11-01 (×7): qty 2

## 2020-11-01 MED ORDER — SODIUM CHLORIDE 0.9 % IV SOLN
INTRAVENOUS | Status: AC
Start: 2020-11-01 — End: 2020-11-01
  Administered 2020-11-01: 50 mL/h via INTRAVENOUS

## 2020-11-01 MED ORDER — MORPHINE SULFATE (PF) 2 MG/ML IV SOLN: 1.0000 mg | INTRAVENOUS | Status: DC | PRN

## 2020-11-01 MED ORDER — ALBUMIN HUMAN 5 % IV SOLN: INTRAVENOUS | Status: DC | PRN

## 2020-11-01 MED ORDER — ACETAMINOPHEN 650 MG RE SUPP: 650.0000 mg | Freq: Four times a day (QID) | RECTAL | Status: DC | PRN

## 2020-11-01 MED ORDER — PROTAMINE SULFATE 10 MG/ML IV SOLN
INTRAVENOUS | Status: DC | PRN
  Administered 2020-11-01: 130 mg via INTRAVENOUS

## 2020-11-01 MED ORDER — CHLORHEXIDINE GLUCONATE CLOTH 2 % EX PADS
6.0000 | MEDICATED_PAD | Freq: Every day | CUTANEOUS | Status: DC
  Administered 2020-11-01 – 2020-11-03 (×3): 6 via TOPICAL

## 2020-11-01 MED ORDER — LACTATED RINGERS IV SOLN: INTRAVENOUS | Status: DC

## 2020-11-01 MED ORDER — LACTATED RINGERS IV SOLN: INTRAVENOUS | Status: DC | PRN

## 2020-11-01 MED ORDER — PHENYLEPHRINE HCL-NACL 20-0.9 MG/250ML-% IV SOLN
0.0000 ug/min | INTRAVENOUS | Status: DC
  Administered 2020-11-01: 20 ug/min via INTRAVENOUS
  Administered 2020-11-01: 60 ug/min via INTRAVENOUS
  Filled 2020-11-01 (×2): qty 250

## 2020-11-01 MED ORDER — OXYCODONE HCL 5 MG PO TABS
5.0000 mg | ORAL_TABLET | ORAL | Status: DC | PRN
  Administered 2020-11-03: 5 mg via ORAL
  Filled 2020-11-01: qty 1

## 2020-11-01 MED ORDER — ONDANSETRON HCL 4 MG/2ML IJ SOLN
INTRAMUSCULAR | Status: DC | PRN
  Administered 2020-11-01: 4 mg via INTRAVENOUS

## 2020-11-01 SURGICAL SUPPLY — 30 items
BAG SNAP BAND KOVER 36X36 (MISCELLANEOUS) ×6 IMPLANT
BLANKET WARM UNDERBOD FULL ACC (MISCELLANEOUS) ×2 IMPLANT
CABLE ADAPT PACING TEMP 12FT (ADAPTER) ×1 IMPLANT
CATH 26 ULTRA DELIVERY (CATHETERS) ×1 IMPLANT
CATH DIAG 6FR PIGTAIL ANGLED (CATHETERS) ×2 IMPLANT
CATH INFINITI 6F AL2 (CATHETERS) ×1 IMPLANT
CATH S G BIP PACING (CATHETERS) ×1 IMPLANT
CLOSURE MYNX CONTROL 6F/7F (Vascular Products) ×1 IMPLANT
CLOSURE PERCLOSE PROSTYLE (VASCULAR PRODUCTS) ×2 IMPLANT
CRIMPER (MISCELLANEOUS) ×1 IMPLANT
DEVICE INFLATION ATRION QL2530 (MISCELLANEOUS) ×1 IMPLANT
GUIDEWIRE SAFE TJ AMPLATZ EXST (WIRE) ×1 IMPLANT
KIT HEART LEFT (KITS) ×2 IMPLANT
KIT MICROPUNCTURE NIT STIFF (SHEATH) ×1 IMPLANT
PACK CARDIAC CATHETERIZATION (CUSTOM PROCEDURE TRAY) ×2 IMPLANT
SHEATH BRITE TIP 7FR 35CM (SHEATH) ×1 IMPLANT
SHEATH EDWARDS INTRO SET 14X36 (SHEATH) ×1 IMPLANT
SHEATH PINNACLE 6F 10CM (SHEATH) ×1 IMPLANT
SHEATH PINNACLE 8F 10CM (SHEATH) ×1 IMPLANT
SHEATH PROBE COVER 6X72 (BAG) ×1 IMPLANT
SHIELD RADPAD SCOOP 12X17 (MISCELLANEOUS) ×1 IMPLANT
SLEEVE REPOSITIONING LENGTH 30 (MISCELLANEOUS) ×1 IMPLANT
STOPCOCK MORSE 400PSI 3WAY (MISCELLANEOUS) ×4 IMPLANT
SYR MEDRAD MARK V 150ML (SYRINGE) ×1 IMPLANT
TRANSDUCER W/STOPCOCK (MISCELLANEOUS) ×4 IMPLANT
VALVE 26 ULTRA SAPIEN KIT (Valve) ×1 IMPLANT
WIRE AMPLATZ SS-J .035X180CM (WIRE) ×1 IMPLANT
WIRE EMERALD 3MM-J .035X150CM (WIRE) ×1 IMPLANT
WIRE EMERALD 3MM-J .035X260CM (WIRE) ×1 IMPLANT
WIRE EMERALD ST .035X260CM (WIRE) ×1 IMPLANT

## 2020-11-01 NOTE — Anesthesia Postprocedure Evaluation (Signed)
Anesthesia Post Note  Patient: Ruben [name]  Procedure(s) Performed: TRANSCATHETER AORTIC VALVE REPLACEMENT, TRANSFEMORAL (N/A )     Patient location during evaluation: Cath Lab Anesthesia Type: MAC Level of consciousness: awake and alert Pain management: pain level controlled Vital Signs Assessment: post-procedure vital signs reviewed and stable Respiratory status: spontaneous breathing Anesthetic complications: no   No complications documented.  Last Vitals:  Vitals:   11/01/20 1344 11/01/20 1400  BP: 124/62 (!) 110/58  Pulse: 60   Resp: 12   Temp: (!) 36.1 C   SpO2: 100%     Last Pain:  Vitals:   11/01/20 1344  TempSrc: Axillary  PainSc: 0-No pain                 [name] Nations

## 2020-11-01 NOTE — Progress Notes (Signed)
Patient voiding less than 30ml last 2 hr.  Bladder scan patient He has > Paged cardiology team.

## 2020-11-01 NOTE — Progress Notes (Signed)
Paged Cardiology for order to straight cath or foley.  Bladder scan >999

## 2020-11-01 NOTE — Discharge Instructions (Signed)
ACTIVITY AND EXERCISE °• Daily activity and exercise are an important part of your recovery. People recover at different rates depending on their general health and type of valve procedure. °• Most people recovering from TAVR feel better relatively quickly  °• No lifting, pushing, pulling more than 10 pounds (examples to avoid: groceries, vacuuming, gardening, golfing): °            - For one week with a procedure through the groin. °            - For six weeks for procedures through the chest wall or neck. °NOTE: You will typically see one of our providers 7-14 days after your procedure to discuss WHEN TO RESUME the above activities.  °  °  °DRIVING °• Do not drive until you are seen for follow up and cleared by a provider. Generally, we ask patient to not drive for 1 week after their procedure. °• If you have been told by your doctor in the past that you may not drive, you must talk with him/her before you begin driving again. °  °DRESSING °• Groin site: you may leave the clear dressing over the site for up to one week or until it falls off. °  °HYGIENE °• If you had a femoral (leg) procedure, you may take a shower when you return home. After the shower, pat the site dry. Do NOT use powder, oils or lotions in your groin area until the site has completely healed. °• If you had a chest procedure, you may shower when you return home unless specifically instructed not to by your discharging practitioner. °            - DO NOT scrub incision; pat dry with a towel. °            - DO NOT apply any lotions, oils, powders to the incision. °            - No tub baths / swimming for at least 2 weeks. °• If you notice any fevers, chills, increased pain, swelling, bleeding or pus, please contact your doctor. °  °ADDITIONAL INFORMATION °• If you are going to have an upcoming dental procedure, please contact our office as you will require antibiotics ahead of time to prevent infection on your heart valve.  ° ° °If you have any  questions or concerns you can call the structural heart phone during normal business hours 8am-4pm. If you have an urgent need after hours or weekends please call [phone] to talk to the on call provider for general cardiology. If you have an emergency that requires immediate attention, please call 911.  ° ° °After TAVR Checklist ° °Check  Test Description  ° Follow up appointment in 1-2 weeks  You will see our structural heart physician assistant, Katie Nahuel Wilbert. Your incision sites will be checked and you will be cleared to drive and resume all normal activities if you are doing well.    ° 1 month echo and follow up  You will have an echo to check on your new heart valve and be seen back in the office by Katie Ashley Montminy. Many times the echo is not read by your appointment time, but Katie will call you later that day or the following day to report your results.  ° Follow up with your primary cardiologist You will need to be seen by your primary cardiologist in the following 3-6 months after your 1 month appointment in the valve   clinic. Often times your Plavix or Aspirin will be discontinued during this time, but this is decided on a case by case basis.   ° 1 year echo and follow up You will have another echo to check on your heart valve after 1 year and be seen back in the office by Katie Willie Plain. This your last structural heart visit.  ° Bacterial endocarditis prophylaxis  You will have to take antibiotics for the rest of your life before all dental procedures (even teeth cleanings) to protect your heart valve. Antibiotics are also required before some surgeries. Please check with your cardiologist before scheduling any surgeries. Also, please make sure to tell us if you have a penicillin allergy as you will require an alternative antibiotic.   ° ° °

## 2020-11-01 NOTE — Op Note (Signed)
HEART AND VASCULAR CENTER   MULTIDISCIPLINARY HEART VALVE TEAM   TAVR OPERATIVE NOTE   Date of Procedure:  11/01/2020  Preoperative Diagnosis: Severe Aortic Stenosis   Postoperative Diagnosis: Same   Procedure:    Transcatheter Aortic Valve Replacement - Percutaneous Right Transfemoral Approach  Edwards Sapien 3 Ultra THV (size 26 mm, model # 9750TFX, serial # [id number])   Co-Surgeons:  Alleen Borne, MD and Tonny Bollman, MD    Anesthesiologist:  Jolyne Loa, MD  Echocardiographer:  P. Eden Emms, MD  Pre-operative Echo Findings:  Severe aortic stenosis  Normal left ventricular systolic function  Moderate to severe MR  Post-operative Echo Findings:  No paravalvular leak  Normal left ventricular systolic function  Unchanged moderate to severe MR   BRIEF CLINICAL NOTE AND INDICATIONS FOR SURGERY  Ruben Butterfield [name] a 87 y.o.malewith a hx of chronic diastolic CHF, RBBB, mitral regurgitation, HTN, COPDon home 02, longstanding persistent atrial fibrillation on warfarin, degenerative arthritis, severe aortic stenosisbeing worked up for TAVR, and recent mechanical fall with hip fracture s/p ORIFand admission for acute on chronic diastolic CHF  In12/2021 he was hospitalized with acute hypoxic respiratory failure felt related to a combination of COPD with acute exacerbation, rhinovirus infection, and acute on chronic diastolic congestive heart failure. Symptoms improved with antibiotics, diuresis, and nebulized bronchodilators. Transthoracic echocardiogram performed at that time revealed normal left ventricular systolic function with severe aortic stenosis and what was initially felt to be mild mitral regurgitation. He was ultimately discharged home on home oxygen therapy. He was referred to the multidisciplinary heart valve clinic and was evaluated by Dr. Excell Seltzer and Dr. Cornelius Moras. Diagnostic cardiac catheterization 08/24/20 revealed diffuse calcific nonobstructive coronary artery  disease with severe aortic stenosis and mild pulmonary hypertension. TEE confirmed presence of findings consistent with severe aortic stenosis. TEE also revealed severe mitral regurgitation with a small flail segment of the middle scallop of the posterior leaflet and an eccentric jet of mitral regurgitation with flow reversal in the pulmonary veins. There was severe mitral annular calcification and functional anatomy of the valve did not appear amenable to transcatheter edge-to-edge repair.   During the course of the patient's preoperative work up they have been evaluated comprehensively by a multidisciplinary team of specialists coordinated through the Multidisciplinary Heart Valve Clinic in the [facility].  They have been demonstrated to suffer from symptomatic severe aortic stenosis as noted above. The patient has been counseled extensively as to the relative risks and benefits of all options for the treatment of severe aortic stenosis including long term medical therapy, conventional surgery for aortic valve replacement, and transcatheter aortic valve replacement.  All questions have been answered, and the patient provides full informed consent for the operation as described.   DETAILS OF THE OPERATIVE PROCEDURE  PREPARATION:    The patient was brought to the operating room on the above mentioned date and appropriate monitoring was established by the anesthesia team. The patient was placed in the supine position on the operating table.  Intravenous antibiotics were administered. The patient was monitored closely throughout the procedure under conscious sedation.   Baseline transthoracic echocardiogram was performed. The patient's abdomen and both groins were prepped and draped in a sterile manner. A time out procedure was performed.   PERIPHERAL ACCESS:    Using the modified Seldinger technique, femoral arterial and venous access was obtained with placement of 6 Fr  sheaths on the left side.  A pigtail diagnostic catheter was passed through the left  arterial sheath under fluoroscopic guidance into the aortic root.  A temporary transvenous pacemaker catheter was passed through the left femoral venous sheath under fluoroscopic guidance into the right ventricle.  The pacemaker was tested to ensure stable lead placement and pacemaker capture. Aortic root angiography was performed in order to determine the optimal angiographic angle for valve deployment.   TRANSFEMORAL ACCESS:   Percutaneous transfemoral access and sheath placement was performed using ultrasound guidance.  The right common femoral artery was cannulated using a micropuncture needle and appropriate location was verified using hand injection angiogram.  A pair of Abbott Perclose percutaneous closure devices were placed and a 6 French sheath replaced into the femoral artery.  The patient was heparinized systemically and ACT verified > 250 seconds.    A 14 Fr transfemoral E-sheath was introduced into the right common femoral artery after progressively dilating over an Amplatz superstiff wire. An AL-2 catheter was used to direct a straight-tip exchange length wire across the native aortic valve into the left ventricle. This was exchanged out for a pigtail catheter and position was confirmed in the LV apex. Simultaneous LV and Ao pressures were recorded.  The pigtail catheter was exchanged for an Amplatz Extra-stiff wire in the LV apex.     BALLOON AORTIC VALVULOPLASTY:   Not performed   TRANSCATHETER HEART VALVE DEPLOYMENT:   An Edwards Sapien 3 Ultra transcatheter heart valve (size 26 mm) was prepared and crimped per manufacturer's guidelines, and the proper orientation of the valve is confirmed on the [employer] delivery system. The valve was advanced through the introducer sheath using normal technique until in an appropriate position in the abdominal aorta beyond the sheath tip. The balloon was  then retracted and using the fine-tuning wheel was centered on the valve. The valve was then advanced across the aortic arch using appropriate flexion of the catheter. The valve was carefully positioned across the aortic valve annulus. The Commander catheter was retracted using normal technique. Once final position of the valve has been confirmed by angiographic assessment, the valve is deployed while temporarily holding ventilation and during rapid ventricular pacing to maintain systolic blood pressure < 50 mmHg and pulse pressure < 10 mmHg. The balloon inflation is held for >3 seconds after reaching full deployment volume. Once the balloon has fully deflated the balloon is retracted into the ascending aorta and valve function is assessed using echocardiography. There is felt to be no paravalvular leak and no central aortic insufficiency.  The patient's hemodynamic recovery following valve deployment is good.  The deployment balloon and guidewire are both removed.    PROCEDURE COMPLETION:   The sheath was removed and femoral artery closure performed.  Protamine was administered once femoral arterial repair was complete. The temporary pacemaker, pigtail catheters and femoral sheaths were removed with manual pressure used for hemostasis.  A Mynx femoral closure device was utilized following removal of the diagnostic sheath in the left femoral artery.  The patient tolerated the procedure well and is transported to the cath lab recovery area in stable condition. There were no immediate intraoperative complications. All sponge instrument and needle counts are verified correct at completion of the operation.   No blood products were administered during the operation.  The patient received a total of 40 mL of intravenous contrast during the procedure.   Alleen Borne, MD 11/01/2020

## 2020-11-01 NOTE — Progress Notes (Signed)
Male Purewick in place, clear, yellow urine noted in tubing, safety maintained

## 2020-11-01 NOTE — Progress Notes (Addendum)
Patient adm from PACU.  Right groin benign Level 0 Left groin pacing wire through [place] venous sheath.  Pacing at 60.  Levo at 2mg .  Patient very sleepy will wake up to voice and answer all questions appropriately then goes back to sleep.

## 2020-11-01 NOTE — Progress Notes (Signed)
Ruben [name] with cardiology returned page New order received.  Also informed her patient on Neo @ 40mg 

## 2020-11-01 NOTE — Op Note (Signed)
[facility] AND VASCULAR CENTER   MULTIDISCIPLINARY HEART VALVE TEAM   TAVR OPERATIVE NOTE   Date of Procedure:  11/01/2020  Preoperative Diagnosis: Severe Aortic Stenosis   Postoperative Diagnosis: Same   Procedure:    Transcatheter Aortic Valve Replacement - Percutaneous  Transfemoral Approach  Edwards Sapien 3 Ultra THV (size 26 mm, model # 9750TFX, serial # [id number])   Co-Surgeons:  Alleen Borne, MD and Tonny Bollman, MD  Anesthesiologist:  Lewie Loron, MD  Echocardiographer:  Charlton Haws, MD  Pre-operative Echo Findings:  Severe aortic stenosis  Normal left ventricular systolic function  3+ MR  Post-operative Echo Findings:  No paravalvular leak  Normal/unchanged left ventricular systolic function  BRIEF CLINICAL NOTE AND INDICATIONS FOR SURGERY  Please see the complete operative note of Dr Laneta Simmers for full details on indications and patient background. The patient is re-hospitalized for acute on chronic decompensated diastolic heart failure in the setting of severe aortic stenosis. We had initially scheduled the patient for TAVR until he had a mechanical fall and sustained a hip fracture. TAVR was postponed, but he has declined with recurrent CHF since his hip fracture. We have elected to proceed with inpatient TAVR today.   During the course of the patient's preoperative work up they have been evaluated comprehensively by a multidisciplinary team of specialists coordinated through the Multidisciplinary Heart Valve Clinic in the [facility].  They have been demonstrated to suffer from symptomatic severe aortic stenosis as noted above. The patient has been counseled extensively as to the relative risks and benefits of all options for the treatment of severe aortic stenosis including long term medical therapy, conventional surgery for aortic valve replacement, and transcatheter aortic valve replacement.  The patient has been independently  evaluated in formal cardiac surgical consultation by Dr Cornelius Moras and Dr Laneta Simmers, who deemed the patient appropriate for TAVR. Based upon review of all of the patient's preoperative diagnostic tests they are felt to be candidate for transcatheter aortic valve replacement using the transfemoral approach as an alternative to conventional surgery.    Following the decision to proceed with transcatheter aortic valve replacement, a discussion has been held regarding what types of management strategies would be attempted intraoperatively in the event of life-threatening complications, including whether or not the patient would be considered a candidate for the use of cardiopulmonary bypass and/or conversion to open sternotomy for attempted surgical intervention.  The patient has been advised of a variety of complications that might develop peculiar to this approach including but not limited to risks of death, stroke, paravalvular leak, aortic dissection or other major vascular complications, aortic annulus rupture, device embolization, cardiac rupture or perforation, acute myocardial infarction, arrhythmia, heart block or bradycardia requiring permanent pacemaker placement, congestive heart failure, respiratory failure, renal failure, pneumonia, infection, other late complications related to structural valve deterioration or migration, or other complications that might ultimately cause a temporary or permanent loss of functional independence or other long term morbidity.  The patient provides full informed consent for the procedure as described and all questions were answered preoperatively.  DETAILS OF THE OPERATIVE PROCEDURE  PREPARATION:   The patient is brought to the operating room on the above mentioned date and central monitoring was established by the anesthesia team including placement of a radial arterial line. The patient is placed in the supine position on the operating table.  Intravenous antibiotics are  administered. The patient is monitored closely throughout the procedure under conscious sedation.    Baseline  transthoracic echocardiogram is performed. The patient's chest, abdomen, both groins, and both lower extremities are prepared and draped in a sterile manner. A time out procedure is performed.   PERIPHERAL ACCESS:   Using ultrasound guidance, femoral arterial and venous access is obtained with placement of 6 Fr sheaths on the left side.  A pigtail diagnostic catheter was passed through the femoral arterial sheath under fluoroscopic guidance into the aortic root.  A temporary transvenous pacemaker catheter was passed through the femoral venous sheath under fluoroscopic guidance into the right ventricle.  The pacemaker was tested to ensure stable lead placement and pacemaker capture. Aortic root angiography was performed in order to determine the optimal angiographic angle for valve deployment.  TRANSFEMORAL ACCESS:  A micropuncture technique is used to access the right femoral artery under fluoroscopic and ultrasound guidance.  2 Perclose devices are deployed at 10' and 2' positions to 'PreClose' the femoral artery. An 8 French sheath is placed and then an Amplatz Superstiff wire is advanced through the sheath. This is changed out for a 14 French transfemoral E-Sheath after progressively dilating over the Superstiff wire.  An AL-2 catheter was used to direct a straight-tip exchange length wire across the native aortic valve into the left ventricle. This was exchanged out for a pigtail catheter and position was confirmed in the LV apex. Simultaneous LV and Ao pressures were recorded.  The pigtail catheter was exchanged for an Amplatz Extra-stiff wire in the LV apex.    BALLOON AORTIC VALVULOPLASTY:  Not performed  TRANSCATHETER HEART VALVE DEPLOYMENT:  An Edwards Sapien 3 transcatheter heart valve (size 26 mm) was prepared and crimped per manufacturer's guidelines, and the proper orientation of  the valve is confirmed on the [employer] delivery system. The valve was advanced through the introducer sheath using normal technique until in an appropriate position in the abdominal aorta beyond the sheath tip. The balloon was then retracted and using the fine-tuning wheel was centered on the valve. The valve was then advanced across the aortic arch using appropriate flexion of the catheter. The valve was carefully positioned across the aortic valve annulus. The Commander catheter was retracted using normal technique. Once final position of the valve has been confirmed by angiographic assessment, the valve is deployed while temporarily holding ventilation and during rapid ventricular pacing to maintain systolic blood pressure < 50 mmHg and pulse pressure < 10 mmHg. The balloon inflation is held for >3 seconds after reaching full deployment volume. Once the balloon has fully deflated the balloon is retracted into the ascending aorta and valve function is assessed using echocardiography. The patient's hemodynamic recovery following valve deployment is good.  The deployment balloon and guidewire are both removed. Echo demostrated acceptable post-procedural gradients, stable mitral valve function, and no aortic insufficiency.    PROCEDURE COMPLETION:  The sheath was removed and femoral artery closure is performed using the 2 previously deployed Perclose devices.  Protamine is administered once femoral arterial repair was complete. The site is clear with no evidence of bleeding or hematoma after the sutures are tightened. The pigtail catheters are removed. Mynx closure is used for contralateral femoral arterial hemostasis for the 6 Fr sheath. The temporary wire is left in place as the patient has marked bradycardia during the procedure and requires temporary transvenous pacing.   The patient tolerated the procedure well and is transported to the surgical intensive care in stable condition. There were no  immediate intraoperative complications. All sponge instrument and needle counts are verified correct  at completion of the operation.   The patient received a total of 40 mL of intravenous contrast during the procedure.   Tonny Bollman, MD 11/01/2020 8:14 PM

## 2020-11-01 NOTE — Anesthesia Procedure Notes (Signed)
Arterial Line Insertion Start/End3/29/2022 9:00 AM, 11/01/2020 9:03 AM Performed by: Epifanio Lesches, CRNA, CRNA  Preanesthetic checklist: patient identified, IV checked, site marked, risks and benefits discussed, surgical consent, monitors and equipment checked, pre-op evaluation, timeout performed and anesthesia consent Lidocaine 1% used for infiltration Right, radial was placed Catheter size: 20 G Hand hygiene performed  and maximum sterile barriers used  Allen's test indicative of satisfactory collateral circulation Attempts: 2 Procedure performed without using ultrasound guided technique. Following insertion, dressing applied and Biopatch. Post procedure assessment: normal  Patient tolerated the procedure well with no immediate complications.

## 2020-11-01 NOTE — Progress Notes (Signed)
  HE[facility] AND VASCULAR CENTER   MULTIDISCIPLINARY HEART VALVE TEAM  Patient doing well s/p TAVR. He is hemodynamically stable but requiring a low dose of pressors. Started on NRB for hypoxia into 70s but satting at 100% now and appears comfortable. Groin sites stable. ECG with aced rhythm and underlying afib with SVR with HRs in 30s. Plan for transfer to 2h for close monitoring.   Cline Crock PA-C  [facility]  Pager [phone]

## 2020-11-01 NOTE — Progress Notes (Signed)
Started Neo per order Patient's SBP,90 and Map <70

## 2020-11-01 NOTE — Interval H&P Note (Signed)
History and Physical Interval Note:  11/01/2020 8:49 AM  Ruben [name]  has presented today for surgery, with the diagnosis of Severe Aortic Stenosis.  The various methods of treatment have been discussed with the patient and family. After consideration of risks, benefits and other options for treatment, the patient has consented to  Procedure(s): TRANSCATHETER AORTIC VALVE REPLACEMENT, TRANSFEMORAL (N/A) TRANSESOPHAGEAL ECHOCARDIOGRAM (TEE) (N/A) as a surgical intervention.  The patient's history has been reviewed, patient examined, no change in status, stable for surgery.  I have reviewed the patient's chart and labs.  Questions were answered to the patient's satisfaction.     Tonny Bollman

## 2020-11-01 NOTE — Transfer of Care (Signed)
Immediate Anesthesia Transfer of Care Note  Patient: Ruben [name]  Procedure(s) Performed: TRANSCATHETER AORTIC VALVE REPLACEMENT, TRANSFEMORAL (N/A )  Patient Location: PACU  Anesthesia Type:MAC  Level of Consciousness: awake, alert  and oriented  Airway & Oxygen Therapy: Patient Spontanous Breathing and Patient connected to face mask oxygen  Post-op Assessment: Report given to RN and Post -op Vital signs reviewed and stable  Post vital signs: Reviewed and stable  Last Vitals:  Vitals Value Taken Time  BP 96/45 11/01/20 1151  Temp    Pulse 50 11/01/20 1154  Resp 16 11/01/20 1155  SpO2 100 % 11/01/20 1155  Vitals shown include unvalidated device data.  Last Pain:  Vitals:   11/01/20 0854  TempSrc:   PainSc: 0-No pain      Patients Stated Pain Goal: 3 (34/03/52 4818)  Complications: No complications documented.

## 2020-11-01 NOTE — Progress Notes (Signed)
  Echocardiogram 2D Echocardiogram limited has been performed.  Ruben [name] 11/01/2020, 11:44 AM

## 2020-11-01 NOTE — Progress Notes (Signed)
ANTICOAGULATION CONSULT NOTE  Pharmacy Consult for Heparin Indication: atrial fibrillation  No Known Allergies  Patient Measurements: Height: 5\' 6"  (167.6 cm) Weight: 85.9 kg (189 lb 6.4 oz) IBW/kg (Calculated) : 63.8 Heparin Dosing Weight: 80 kg  Vital Signs: Temp: 98.6 F (37 C) (03/29 0352) Temp Source: Oral (03/29 0352) BP: 114/58 (03/29 0352) Pulse Rate: 93 (03/29 0352)  Labs: Recent Labs    10/31/20 0305 10/31/20 0958 10/31/20 1259 10/31/20 1955 11/01/20 0347  HGB 9.9* 11.5*  --   --  8.9*  HCT 31.8* 35.4*  --   --  28.7*  PLT 237 241  --   --  201  APTT  --  125*  --   --   --   LABPROT 17.2* 16.0*  --  15.5* 15.2  INR 1.5* 1.3*  --  1.3* 1.2  HEPARINUNFRC 0.25*  --  0.46  --  0.51  CREATININE 1.06 1.01  --   --  0.89    Estimated Creatinine Clearance: 55.5 mL/min (by C-G formula based on SCr of 0.89 mg/dL).   Medical History: Past Medical History:  Diagnosis Date  . Arthritis   . CHF (congestive heart failure) (HCC)   . COPD (chronic obstructive pulmonary disease) (HCC)   . Mitral regurgitation   . Persistent atrial fibrillation (HCC)    on coumadin   . RBBB   . Severe aortic stenosis      Assessment: 87 y.o. male with h/o Afib on warfarin PTA, now on hold awaiting TAVR. INR down < 2, pharmacy consulted for heparin   HL 0.51 on 1400 units/hr. Planned TAVR 3/29. H/H, plt stable.   Goal of Therapy:  Heparin level 0.3-0.7 units/ml Monitor platelets by anticoagulation protocol: Yes   Plan:  Continue heparin to 1400 units/hr Monitor daily HL, CBC/plt Monitor for signs/symptoms of bleeding  F/u restart warfarin after TAVR    4/29, PharmD, BCPS, BCCP Clinical Pharmacist  Please check AMION for all [facility] Pharmacy phone numbers After 10:00 PM, call Main Pharmacy [phone]

## 2020-11-02 ENCOUNTER — Encounter (HOSPITAL_COMMUNITY): Payer: Self-pay | Admitting: Cardiovascular Disease

## 2020-11-02 ENCOUNTER — Inpatient Hospital Stay (HOSPITAL_COMMUNITY): Payer: Medicare Other

## 2020-11-02 DIAGNOSIS — Z952 Presence of prosthetic heart valve: Secondary | ICD-10-CM

## 2020-11-02 DIAGNOSIS — Z006 Encounter for examination for normal comparison and control in clinical research program: Secondary | ICD-10-CM | POA: Diagnosis not present

## 2020-11-02 DIAGNOSIS — I5033 Acute on chronic diastolic (congestive) heart failure: Secondary | ICD-10-CM | POA: Diagnosis not present

## 2020-11-02 DIAGNOSIS — I083 Combined rheumatic disorders of mitral, aortic and tricuspid valves: Secondary | ICD-10-CM | POA: Diagnosis not present

## 2020-11-02 DIAGNOSIS — J9621 Acute and chronic respiratory failure with hypoxia: Secondary | ICD-10-CM | POA: Diagnosis not present

## 2020-11-02 LAB — BASIC METABOLIC PANEL
Anion gap: 6 (ref 5–15)
BUN: 17 mg/dL (ref 8–23)
CO2: 36 mmol/L — ABNORMAL HIGH (ref 22–32)
Calcium: 8.6 mg/dL — ABNORMAL LOW (ref 8.9–10.3)
Chloride: 93 mmol/L — ABNORMAL LOW (ref 98–111)
Creatinine, Ser: 0.75 mg/dL (ref 0.61–1.24)
GFR, Estimated: >60 mL/min (ref >60)
Glucose, Bld: 92 mg/dL (ref 70–99)
Potassium: 3.9 mmol/L (ref 3.5–5.1)
Sodium: 135 mmol/L (ref 135–145)

## 2020-11-02 LAB — ECHOCARDIOGRAM LIMITED
AR max vel: 2.32 cm2
AV Area VTI: 2.34 cm2
AV Area mean vel: 2.28 cm2
AV Mean grad: 7.5 mmHg
AV Peak grad: 13.7 mmHg
Ao pk vel: 1.85 m/s
Area-P 1/2: 2.22 cm2
Height: 67 in
MV VTI: 1.8 cm2
Weight: 3037.06 oz

## 2020-11-02 LAB — CBC
HCT: 30.3 % — ABNORMAL LOW (ref 39.0–52.0)
Hemoglobin: 9.3 g/dL — ABNORMAL LOW (ref 13.0–17.0)
MCH: 30.7 pg (ref 26.0–34.0)
MCHC: 30.7 g/dL (ref 30.0–36.0)
MCV: 100 fL (ref 80.0–100.0)
Platelets: 181 10*3/uL (ref 150–400)
RBC: 3.03 MIL/uL — ABNORMAL LOW (ref 4.22–5.81)
RDW: 16 % — ABNORMAL HIGH (ref 11.5–15.5)
WBC: 6.5 10*3/uL (ref 4.0–10.5)
nRBC: 0 % (ref 0.0–0.2)

## 2020-11-02 LAB — MAGNESIUM: Magnesium: 1.6 mg/dL — ABNORMAL LOW (ref 1.7–2.4)

## 2020-11-02 MED ORDER — TORSEMIDE 20 MG PO TABS
40.0000 mg | ORAL_TABLET | Freq: Every day | ORAL | Status: DC
  Administered 2020-11-02 – 2020-11-05 (×4): 40 mg via ORAL
  Filled 2020-11-02 (×4): qty 2

## 2020-11-02 MED ORDER — POTASSIUM CHLORIDE CRYS ER 20 MEQ PO TBCR
20.0000 meq | EXTENDED_RELEASE_TABLET | Freq: Every day | ORAL | Status: DC
  Administered 2020-11-02 – 2020-11-05 (×4): 20 meq via ORAL
  Filled 2020-11-02 (×4): qty 1

## 2020-11-02 MED ORDER — MAGNESIUM SULFATE 2 GM/50ML IV SOLN
2.0000 g | Freq: Once | INTRAVENOUS | Status: AC
  Administered 2020-11-02: 2 g via INTRAVENOUS
  Filled 2020-11-02: qty 50

## 2020-11-02 MED ORDER — ENSURE ENLIVE PO LIQD
237.0000 mL | Freq: Three times a day (TID) | ORAL | Status: DC
  Administered 2020-11-02 – 2020-11-05 (×7): 237 mL via ORAL

## 2020-11-02 MED ORDER — ALBUTEROL SULFATE (2.5 MG/3ML) 0.083% IN NEBU
2.5000 mg | INHALATION_SOLUTION | Freq: Two times a day (BID) | RESPIRATORY_TRACT | Status: DC
  Administered 2020-11-02 – 2020-11-05 (×6): 2.5 mg via RESPIRATORY_TRACT
  Filled 2020-11-02 (×6): qty 3

## 2020-11-02 MED ORDER — BETHANECHOL CHLORIDE 10 MG PO TABS
10.0000 mg | ORAL_TABLET | Freq: Three times a day (TID) | ORAL | Status: AC
  Administered 2020-11-02: 10 mg via ORAL
  Filled 2020-11-02: qty 1

## 2020-11-02 MED FILL — Heparin Sod (Porcine)-NaCl IV Soln 1000 Unit/500ML-0.9%: INTRAVENOUS | Qty: 500 | Status: AC

## 2020-11-02 NOTE — Progress Notes (Addendum)
[facility]   MULTIDISCIPLINARY HEART VALVE TEAM  Patient Name: Ruben [name] Date of Encounter: 11/02/2020  Primary Cardiologist: Dr. Flora Lipps / Dr. Excell Seltzer & Dr. Laneta Simmers (TAVR)  Hospital Problem List     Principal Problem:   Acute on chronic diastolic (congestive) heart failure [facility]) Active Problems:   Atrial fibrillation, chronic (HCC)   Severe aortic stenosis   COPD (chronic obstructive pulmonary disease) (HCC)   Mitral regurgitation   Trochanteric fracture of left femur (HCC)   BPH (benign prostatic hyperplasia)   S/P TAVR (transcatheter aortic valve replacement)     Subjective   Breathing much better. Has some mild groin pain where temp wire was and also left heel pain.   Inpatient Medications    Scheduled Meds: . albuterol  2.5 mg Nebulization BID  . aspirin  81 mg Oral Daily  . Chlorhexidine Gluconate Cloth  6 each Topical Daily  . doxycycline  100 mg Oral Q12H  . dutasteride  0.5 mg Oral Daily  . ferrous gluconate  324 mg Oral QODAY  . montelukast  10 mg Oral QHS  . potassium chloride  40 mEq Oral Daily  . senna-docusate  2 tablet Oral Daily  . sodium chloride flush  3 mL Intravenous Q12H  . tamsulosin  0.8 mg Oral QHS  . umeclidinium bromide  1 puff Inhalation Daily  . vitamin B-12  500 mcg Oral Daily   Continuous Infusions: . sodium chloride Stopped (11/02/20 0749)  . cefUROXime (ZINACEF)  IV 1.5 g (11/02/20 0749)  . nitroGLYCERIN    . phenylephrine (NEO-SYNEPHRINE) Adult infusion Stopped (11/02/20 0541)   PRN Meds: sodium chloride, acetaminophen **OR** acetaminophen, diclofenac Sodium, morphine injection, ondansetron (ZOFRAN) IV, oxyCODONE, polyethylene glycol, sodium chloride flush, traMADol   Vital Signs    Vitals:   11/02/20 0600 11/02/20 0700 11/02/20 0800 11/02/20 0818  BP:   (!) 106/53   Pulse: 61 82 91   Resp: 16 15 (!) 21   Temp:    97.9 F (36.6 C)  TempSrc:    Oral  SpO2: 95% 97% 100%   Weight:      Height:         Intake/Output Summary (Last 24 hours) at 11/02/2020 0901 Last data filed at 11/02/2020 0800 Gross per 24 hour  Intake 2800.32 ml  Output 4635 ml  Net -1834.68 ml   Filed Weights   11/01/20 0500 11/01/20 1344 11/02/20 0500  Weight: 85.9 kg 85.4 kg 86.1 kg    Physical Exam   GEN: Well nourished, well developed, in no acute distress.  HEENT: Grossly normal.  Neck: Supple, no JVD, carotid bruits, or masses. Cardiac: RRR, 2/6 holosystolic murmur at apex. No rubs, or gallops. No clubbing, cyanosis, trace bilateral LE edema. SCDs on and left foot in a pressure wrap  Respiratory:  Respirations regular and unlabored, clear to auscultation bilaterally. GI: Soft, nontender, nondistended, BS + x 4. MS: no deformity or atrophy. Skin: warm and dry, no rash.  Groin sites clear without hematoma or ecchymosis. Temp wire in left groin, which I pulled. Neuro:  Strength and sensation are intact. Psych: AAOx3.  Normal affect.  Labs    CBC Recent Labs    11/01/20 0347 11/01/20 1003 11/01/20 1649 11/02/20 0429  WBC 6.7  --   --  6.5  HGB 8.9*   < > 9.3* 9.3*  HCT 28.7*   < > 29.1* 30.3*  MCV 99.3  --   --  100.0  PLT 201  --   --  181   < > = values in this interval not displayed.   Basic Metabolic Panel Recent Labs    05/30/84 1755 11/02/20 0429  NA 138 135  K 3.8 3.9  CL 95* 93*  CO2 37* 36*  GLUCOSE 79 92  BUN 20 17  CREATININE 0.79 0.75  CALCIUM 8.5* 8.6*  MG 1.7 1.6*   Liver Function Tests Recent Labs    10/31/20 0958  AST 22  ALT 11  ALKPHOS 122  BILITOT 0.9  PROT 7.0  ALBUMIN 3.2*   No results for input(s): LIPASE, AMYLASE in the last 72 hours. Cardiac Enzymes No results for input(s): CKTOTAL, CKMB, CKMBINDEX, TROPONINI in the last 72 hours. BNP Invalid input(s): POCBNP D-Dimer No results for input(s): DDIMER in the last 72 hours. Hemoglobin A1C Recent Labs    11/01/20 0347  HGBA1C 5.4   Fasting Lipid Panel No results for input(s): CHOL, HDL,  LDLCALC, TRIG, CHOLHDL, LDLDIRECT in the last 72 hours. Thyroid Function Tests No results for input(s): TSH, T4TOTAL, T3FREE, THYROIDAB in the last 72 hours.  Invalid input(s): FREET3  Telemetry    Afib with underlying RBBB and no longer pacing - Personally Reviewed  ECG    afib with old, stable RBBB, HR 90- Personally Reviewed  Radiology    ECHOCARDIOGRAM LIMITED  Result Date: 11/01/2020    ECHOCARDIOGRAM LIMITED REPORT   Patient Name:   Ruben [name] Date of Exam: 11/01/2020 Medical Rec #:  [id number]    Height:       66.0 in Accession #:    [id number]   Weight:       189.4 lb Date of Birth:  Dec 29, 1926     BSA:          1.954 m Patient Age:    87 years     BP:           114/58 mmHg Patient Gender: M            HR:           82 bpm. Exam Location:  Inpatient Procedure: Limited Echo, Echo Assisted Procedure, Limited Color Doppler and            Cardiac Doppler Indications:     Aortic Stenosis; TAVR Procedure  History:         Patient has prior history of Echocardiogram examinations, most                  recent 10/29/2020. CHF; COPD.                  Aortic Valve: 26 mm Edwards Sapien prosthetic, stented (TAVR)                  valve is present in the aortic position. Procedure Date:                  11/01/2020.  Sonographer:     Thurman Coyer RDCS (AE) Referring Phys:  [id number] Janetta Hora Diagnosing Phys: Charlton Haws MD IMPRESSIONS  1. Left ventricular ejection fraction, by estimation, is 60 to 65%. The left ventricle has normal function. There is moderate left ventricular hypertrophy.  2. Left atrial size was severely dilated.  3. Right atrial size was moderately dilated.  4. A small pericardial effusion is present. The pericardial effusion is posterior and lateral to the left ventricle.  5. Known small flail P2 segemnt by TEE with "splay" artifact and large anteriorly directed jet no fully  characterized on this TAVR limited study . The mitral valve is degenerative. Moderate to severe  mitral valve regurgitation. Severe mitral annular calcification.  6. Tricuspid valve regurgitation is moderate.  7. Pre TAVR: tri leaflet severely calcified peak velocity 3.8 m/sec mean gradient 37 mmHg peak 59 mmHg AVA 0.45 cm2         Post TAVR: well positioned 26 mm Sapien 3 valve no PVL peak gradient 8 mean 4 mmHg with AVA 2.2 cm2. The aortic valve has been repaired/replaced. Aortic valve regurgitation is mild. Severe aortic valve stenosis. There is a 26 mm Edwards Sapien prosthetic (TAVR) valve present in the aortic position. Procedure Date: 11/01/2020. FINDINGS  Left Ventricle: Left ventricular ejection fraction, by estimation, is 60 to 65%. The left ventricle has normal function. The left ventricular internal cavity size was small. There is moderate left ventricular hypertrophy. Left Atrium: Left atrial size was severely dilated. Right Atrium: Right atrial size was moderately dilated. Pericardium: A small pericardial effusion is present. The pericardial effusion is posterior and lateral to the left ventricle. Mitral Valve: Known small flail P2 segemnt by TEE with "splay" artifact and large anteriorly directed jet no fully characterized on this TAVR limited study. The mitral valve is degenerative in appearance. There is severe thickening of the mitral valve leaflet(s). There is severe calcification of the mitral valve leaflet(s). Severe mitral annular calcification. Moderate to severe mitral valve regurgitation, with anteriorly-directed jet. Tricuspid Valve: Tricuspid valve regurgitation is moderate. Aortic Valve: Pre TAVR: tri leaflet severely calcified peak velocity 3.8 m/sec mean gradient 37 mmHg peak 59 mmHg AVA 0.45 cm2 Post TAVR: well positioned 26 mm Sapien 3 valve no PVL peak gradient 8 mean 4 mmHg with AVA 2.2 cm2. The aortic valve has been repaired/replaced. Aortic valve regurgitation is mild. Severe aortic stenosis is present. Aortic valve mean gradient measures 4.0 mmHg. Aortic valve peak gradient  measures 7.6 mmHg. Aortic valve area, by VTI measures 2.17 cm. There is a 26 mm Edwards Sapien prosthetic, stented (TAVR) valve present in the aortic position. Procedure Date: 11/01/2020. LEFT VENTRICLE PLAX 2D LVOT diam:     2.10 cm LV SV:         55 LV SV Index:   28 LVOT Area:     3.46 cm  AORTIC VALVE AV Area (Vmax):    1.53 cm AV Area (Vmean):   1.69 cm AV Area (VTI):     2.17 cm AV Vmax:           138.00 cm/s AV Vmean:          88.300 cm/s AV VTI:            0.254 m AV Peak Grad:      7.6 mmHg AV Mean Grad:      4.0 mmHg LVOT Vmax:         61.10 cm/s LVOT Vmean:        43.100 cm/s LVOT VTI:          0.159 m LVOT/AV VTI ratio: 0.63  SHUNTS Systemic VTI:  0.16 m Systemic Diam: 2.10 cm Charlton Haws MD Electronically signed by Charlton Haws MD Signature Date/Time: 11/01/2020/12:48:42 PM    Final    Structural Heart Procedure  Result Date: 11/01/2020 See surgical note for result.   Cardiac Studies   TAVR OPERATIVE NOTE   Date of Procedure:                11/01/2020  Preoperative Diagnosis:  Severe Aortic Stenosis   Postoperative Diagnosis:    Same   Procedure:        Transcatheter Aortic Valve Replacement - Percutaneous  Transfemoral Approach             Edwards Sapien 3 Ultra THV (size 26 mm, model # 9750TFX, serial # [id number])              Co-Surgeons:                        Alleen BorneBryan K. Bartle, MD and Tonny BollmanMichael Ramla Hase, MD  Anesthesiologist:                  Lewie LoronJohn Germeroth, MD  Echocardiographer:              Charlton HawsPeter Nishan, MD  Pre-operative Echo Findings: ? Severe aortic stenosis ? Normal left ventricular systolic function ? 3+ MR  Post-operative Echo Findings: ? No paravalvular leak ? Normal/unchanged left ventricular systolic function  ________________________   Echo 11/02/20: pending   Patient Profile     Ruben [name] a 87 y.o.malewithRBBB, severemitral regurgitation, HTN, COPDon home 02, longstanding persistent atrial fibrillation on warfarin,  degenerative arthritis, severe aortic stenosiswith plans forTAVR, and recent mechanical fall with hip fracture s/p ORIFand admission for acute on chronic diastolic CHFwho was admitted on 10/28/20 with recurrent heart failure.  Assessment & Plan    Acute on chronic diastolic CHF: pt with very difficult to control heart failure requiring multiple admissions. Multifactorial due to diastolic dysfunction, severe AS and moderate to severe MR. Pt has declined significantly since recent hip fracture. Admitted 3/25 and has done quite well with aggressive diuresis. Weight down from 195 to 189.2. Net negative 4.5L. Will restart him on Torsemide 40mg  daily today.   Severe AS: s/p successful TAVR with a 26 mm Edwards Sapien 3 Ultra THV via the TF approach on 11/01/20. Post operative echo pending. Groin sites are stable. ECG with chronic afib with RBBB (old) and no high grade heart block. Continue Asprin alone. Will resume home Coumadin at discharge. Plan to transfer to 4E today. Remove art line and foley when off bedrest (10:30am)  Chronic afib: had profound bradycardia with slow VR in initial post op period. Temp wire left in place. Now HR recovered into 90s and has not paced since yesterday. Temp wire pulled this AM. Given underlying RBBB, will plan to DC with a Zio patch.   Moderate to severe MR: we are hopeful this will improve with TAVR. Anatomy not amendable for TEER.  COPD: continued on home inhalers.   Left heel ulcer: this is chronic and has been bandaged. Pt complains of significant pain here. He has been started on oral doxycycline x 7 days.   Hip fracture s/p ORIF:this has significantly set him back. He has made slow progress with rehabilitation. [facility]. Care management consult to help transition him back here at discharge.   Byrd HesselbachSigned, Kathryn Thompson, PA-C  11/02/2020, 9:01 AM  Pager [phone]  Patient seen,  examined. Available data reviewed. Agree with findings, assessment, and plan as outlined by Carlean JewsKatie Thompson, PA-C. On my exam: Vitals:   11/02/20 0900 11/02/20 1000  BP: (!) 114/52 (!) 120/50  Pulse: 77 97  Resp: (!) 29 (!) 24  Temp:    SpO2: 91% 95%   Pt is alert and oriented, NAD HEENT: normal Neck: JVP - normal Lungs: CTA bilaterally CV:  irregular with 3/6 systolic murmur at the LLSB/apex Abd: soft, NT, Positive BS, no hepatomegaly Ext: no C/C/E, distal pulses intact and equal.  Bilateral groin sites are clear. Skin: warm/dry no rash  Telemetry is reviewed.  The patient is back in his baseline rhythm, atrial fibrillation with right bundle branch block.  His heart rate is now acceptable and he is no longer pacing.  Heart rate is ranging between 60 to 85 bpm.  His right bundle has not widened when I compare his EKG this morning to his baseline EKG.  I think his temporary transvenous pacing wire can be removed.  I think he should be monitored on telemetry in the hospital for another 24 hours.  He could be transferred from the ICU to a telemetry bed today.  I would anticipate discharging him home with a ZIO monitor in place due to high risk of heart block with underlying right bundle branch block and conduction disease at baseline.  The patient otherwise looks very good.  Will discontinue his arterial line and Foley catheter.  We will mobilize today.  Otherwise as outlined above.  Postoperative day #1 echocardiogram is currently pending.  He appears stable to transfer from the cardiovascular ICU to a telemetry bed today.   Tonny Bollman, M.D. 11/02/2020 10:05 AM

## 2020-11-02 NOTE — Progress Notes (Signed)
  Echocardiogram 2D Echocardiogram has been performed.  Stark Bray Swaim 11/02/2020, 11:13 AM

## 2020-11-02 NOTE — Progress Notes (Signed)
CARDIAC REHAB PHASE I   PRE:  Rate/Rhythm: 101 Afib  BP:  Sitting: 116/81      SaO2: 96 5L  MODE:  Ambulation: 160 ft   POST:  Rate/Rhythm: 111 Afib  BP:  Sitting: 107/69    SaO2: 95 6L   Pt agreeable to ambulate. Pt ambulated 135ft in hallway with EVA and gait belt, assist x2 for safety and equipment. Pt returned to recliner. Pt denies pain, SOB, or dizziness. Encouraged continued ambulation. Will continue to follow.  [id number] Reynold Bowen, RN BSN 11/02/2020 11:47 AM

## 2020-11-02 NOTE — Progress Notes (Signed)
Foley d/c'd at 0900 this AM. Pt has had the urge to void but has only passed a few drops of urine.   Bladder scan showed 661 mL. I&O cath complete with 600 mL out. Pt voided 150 mL while getting set up for I&O cath.

## 2020-11-02 NOTE — Plan of Care (Signed)
  Problem: Cardiac: Goal: Ability to achieve and maintain adequate cardiopulmonary perfusion will improve Outcome: Progressing   Problem: Cardiovascular: Goal: Ability to achieve and maintain adequate cardiovascular perfusion will improve Outcome: Progressing Goal: Vascular access site(s) Level 0-1 will be maintained Outcome: Progressing   

## 2020-11-03 ENCOUNTER — Inpatient Hospital Stay (HOSPITAL_COMMUNITY): Payer: Medicare Other

## 2020-11-03 DIAGNOSIS — I5033 Acute on chronic diastolic (congestive) heart failure: Secondary | ICD-10-CM | POA: Diagnosis not present

## 2020-11-03 DIAGNOSIS — Z952 Presence of prosthetic heart valve: Secondary | ICD-10-CM | POA: Diagnosis not present

## 2020-11-03 LAB — BASIC METABOLIC PANEL
Anion gap: 5 (ref 5–15)
BUN: 20 mg/dL (ref 8–23)
CO2: 38 mmol/L — ABNORMAL HIGH (ref 22–32)
Calcium: 8.3 mg/dL — ABNORMAL LOW (ref 8.9–10.3)
Chloride: 93 mmol/L — ABNORMAL LOW (ref 98–111)
Creatinine, Ser: 0.85 mg/dL (ref 0.61–1.24)
GFR, Estimated: >60 mL/min (ref >60)
Glucose, Bld: 109 mg/dL — ABNORMAL HIGH (ref 70–99)
Potassium: 3.5 mmol/L (ref 3.5–5.1)
Sodium: 136 mmol/L (ref 135–145)

## 2020-11-03 LAB — CBC
HCT: 28.7 % — ABNORMAL LOW (ref 39.0–52.0)
Hemoglobin: 9.1 g/dL — ABNORMAL LOW (ref 13.0–17.0)
MCH: 31.4 pg (ref 26.0–34.0)
MCHC: 31.7 g/dL (ref 30.0–36.0)
MCV: 99 fL (ref 80.0–100.0)
Platelets: 150 10*3/uL (ref 150–400)
RBC: 2.9 MIL/uL — ABNORMAL LOW (ref 4.22–5.81)
RDW: 16.1 % — ABNORMAL HIGH (ref 11.5–15.5)
WBC: 6.5 10*3/uL (ref 4.0–10.5)
nRBC: 0 % (ref 0.0–0.2)

## 2020-11-03 MED ORDER — METOPROLOL SUCCINATE ER 50 MG PO TB24
50.0000 mg | ORAL_TABLET | Freq: Every day | ORAL | Status: DC
  Administered 2020-11-03 – 2020-11-05 (×2): 50 mg via ORAL
  Filled 2020-11-03 (×3): qty 1

## 2020-11-03 MED ORDER — POTASSIUM CHLORIDE CRYS ER 20 MEQ PO TBCR
20.0000 meq | EXTENDED_RELEASE_TABLET | ORAL | Status: AC
  Administered 2020-11-03 (×2): 20 meq via ORAL
  Filled 2020-11-03 (×2): qty 1

## 2020-11-03 MED ORDER — WARFARIN SODIUM 5 MG PO TABS
5.0000 mg | ORAL_TABLET | Freq: Once | ORAL | Status: AC
  Administered 2020-11-03: 5 mg via ORAL
  Filled 2020-11-03: qty 1

## 2020-11-03 MED ORDER — WARFARIN - PHARMACIST DOSING INPATIENT: Freq: Every day | Status: DC

## 2020-11-03 NOTE — Evaluation (Signed)
Occupational Therapy Evaluation Patient Details Name: Ruben [name] MRN: [id number] DOB: [birth year] Today's Date: 11/03/2020    History of Present Illness 87 y.o. male with RBBB, severe mitral regurgitation, HTN, COPD on home 02, longstanding persistent atrial fibrillation on warfarin, degenerative arthritis, severe aortic stenosis with plans for TAVR, and recent mechanical fall with hip fracture s/p ORIF and admission for acute on chronic diastolic CHF who was admitted on 10/28/20 with recurrent heart failure. Plans on discharge to son's home with spouse.   Clinical Impression   Patient admitted for scheduled procedure above.  He was completing rehab at SNF post fall and IMN.  He continues to have L hip discomfort, and an xray was shot this am.  His plan is to return home to his son's house where he and his spouse have transitioned to from [place].  He is needing up to [place] for basic mobility, and Min A for LB ADL.  He has been instructed on the use of a hip kit, but is not using any of the pieces currently.  OT to follow in the acute setting to maximize his functional status, but plans on returning home with [facility] when cleared medically.      Follow Up Recommendations  Home health OT    Equipment Recommendations  None recommended by OT    Recommendations for Other Services       Precautions / Restrictions Precautions Precautions: Fall Precaution Comments: Tachy with mobility Restrictions Weight Bearing Restrictions: No Other Position/Activity Restrictions: hip xray this am to LLE - cleared with nursing for OOB      Mobility Bed Mobility Overal bed mobility: Needs Assistance Bed Mobility: Sit to Supine     Supine to sit: Supervision;HOB elevated          Transfers Overall transfer level: Needs assistance Equipment used: Rolling walker (2 wheeled) Transfers: Sit to/from [facility] Sit to Stand: Min assist Stand pivot transfers: Min assist             Balance Overall balance assessment: Needs assistance Sitting-balance support: No upper extremity supported;Feet supported Sitting balance-Leahy Scale: Fair     Standing balance support: Bilateral upper extremity supported Standing balance-Leahy Scale: Poor Standing balance comment: reliant on UE support and external assist                           ADL either performed or assessed with clinical judgement   ADL Overall ADL's : Needs assistance/impaired Eating/Feeding: Independent;Sitting   Grooming: Wash/dry hands;Wash/dry face;Set up;Sitting   Upper Body Bathing: Supervision/ safety;Sitting   Lower Body Bathing: Minimal assistance Lower Body Bathing Details (indicate cue type and reason): unable to reach past B calves Upper Body Dressing : Supervision/safety;Sitting   Lower Body Dressing: Minimal assistance;Sit to/from stand               Functional mobility during ADLs: Minimal assistance;Rolling walker       Vision Patient Visual Report: No change from baseline       Perception     Praxis      Pertinent Vitals/Pain Faces Pain Scale: Hurts even more Pain Location: L hip Pain Descriptors / Indicators: Sore;Aching Pain Intervention(s): Monitored during session     Hand Dominance Right   Extremity/Trunk Assessment Upper Extremity Assessment Upper Extremity Assessment: Overall WFL for tasks assessed   Lower Extremity Assessment Lower Extremity Assessment: Defer to PT evaluation   Cervical / Trunk Assessment Cervical / Trunk  Assessment: Kyphotic   Communication Communication Communication: HOH   Cognition Arousal/Alertness: Awake/alert Behavior During Therapy: WFL for tasks assessed/performed Overall Cognitive Status: Within Functional Limits for tasks assessed                                     General Comments  tachy with mobility    Exercises     Shoulder Instructions      Home Living Family/patient expects to  be discharged to:: Private residence Living Arrangements: Children;Spouse/significant other Available Help at Discharge: Available 24 hours/day;Family;Personal care attendant Type of Home: House Home Access: Stairs to enter [employer] of Steps: 2 Entrance Stairs-Rails: None Home Layout: Two level;Able to live on main level with bedroom/bathroom   Alternate Level Stairs-Rails: Can reach both Bathroom Shower/Tub: Occupational psychologist: Standard Bathroom Accessibility: Yes How Accessible: Accessible via walker Home Equipment: Bedside commode;Grab bars - tub/shower;Hand held shower head;Shower seat;Walker - 2 wheels;Cane - single point;Hospital bed   Additional Comments: Living at son's home.  Assists spouse after recent CVA.      Prior Functioning/Environment Level of Independence: Independent        Comments: Was receiving rehab at [facility] to address strength and endurance following L hip fracture and IMN        OT Problem List: Decreased activity tolerance;Impaired balance (sitting and/or standing);Pain;Decreased knowledge of use of DME or AE      OT Treatment/Interventions: Self-care/ADL training;DME and/or AE instruction;Therapeutic activities;Balance training    OT Goals(Current goals can be found in the care plan section) Acute Rehab OT Goals Patient Stated Goal: get stronger, less pain in hip OT Goal Formulation: With patient Time For Goal Achievement: 11/17/20 Potential to Achieve Goals: Good ADL Goals Pt Will Perform Grooming: with modified independence;sitting;standing Pt Will Perform Lower Body Bathing: with modified independence;sit to/from stand Pt Will Perform Lower Body Dressing: with modified independence;sit to/from stand;with adaptive equipment Pt Will Transfer to Toilet: with modified independence;ambulating;regular height toilet Pt Will Perform Toileting - Clothing Manipulation and hygiene: with modified independence;sit  to/from stand  OT Frequency: Min 2X/week   Barriers to D/C:    none noted       Co-evaluation              AM-PAC OT "6 Clicks" Daily Activity     Outcome Measure Help from another person eating meals?: None Help from another person taking care of personal grooming?: None Help from another person toileting, which includes using toliet, bedpan, or urinal?: A Little Help from another person bathing (including washing, rinsing, drying)?: A Little Help from another person to put on and taking off regular upper body clothing?: A Little Help from another person to put on and taking off regular lower body clothing?: A Little 6 Click Score: 20   End of Session Equipment Utilized During Treatment: Rolling walker;Oxygen Nurse Communication: Mobility status  Activity Tolerance: Patient tolerated treatment well Patient left: in chair;with call bell/phone within reach  OT Visit Diagnosis: Unsteadiness on feet (R26.81);Pain Pain - Right/Left: Left Pain - part of body: Hip                Time: 3244-0102 OT Time Calculation (min): 16 min Charges:  OT General Charges $OT Visit: 1 Visit OT Evaluation $OT Eval Moderate Complexity: 1 Mod  11/03/2020  Rich, OTR/L  Acute Rehabilitation Services  Office:  [place]  11/03/2020, 11:42 AM

## 2020-11-03 NOTE — Progress Notes (Signed)
CARDIAC REHAB PHASE I   Went to offer to walk with pt, mobility tech present. Pt standing in front of chair with EVA. Pt c/o severe hip pain. Pt willing to try to ambulate and took 3 steps before stating he could not go any further. Pt assisted to urinate, and sit back into chair. Linens and socks changed. RN made aware of pts pain level. Encouraged pt to work with PT as able. Will continue to follow.  [id number] Reynold Bowen, RN BSN 11/03/2020 1:33 PM

## 2020-11-03 NOTE — Progress Notes (Signed)
Patient arrived from [facility] vital signs obtained and patient with recent chg bath per report from [facility]. Placed on monitor and CCMD made aware. Will monitor patient. Ruben [name], Randall An rN

## 2020-11-03 NOTE — Progress Notes (Signed)
ANTICOAGULATION CONSULT NOTE  Pharmacy Consult for warfarin Indication: atrial fibrillation  No Known Allergies  Patient Measurements: Height: 5\' 7"  (170.2 cm) Weight: 84.7 kg (186 lb 11.7 oz) IBW/kg (Calculated) : 66.1 Heparin Dosing Weight: 80 kg  Vital Signs: Temp: 97.9 F (36.6 C) (03/31 0740) Temp Source: Oral (03/31 0740) BP: 107/66 (03/31 0939) Pulse Rate: 78 (03/31 1000)  Labs: Recent Labs    10/31/20 1259 10/31/20 1955 11/01/20 0347 11/01/20 1003 11/01/20 1649 11/01/20 1755 11/02/20 0429 11/03/20 0118  HGB  --   --  8.9*   < > 9.3*  --  9.3* 9.1*  HCT  --   --  28.7*   < > 29.1*  --  30.3* 28.7*  PLT  --   --  201  --   --   --  181 150  LABPROT  --  15.5* 15.2  --   --   --   --   --   INR  --  1.3* 1.2  --   --   --   --   --   HEPARINUNFRC 0.46  --  0.51  --   --   --   --   --   CREATININE  --   --  0.89   < >  --  0.79 0.75 0.85   < > = values in this interval not displayed.    Estimated Creatinine Clearance: 58.8 mL/min (by C-G formula based on SCr of 0.85 mg/dL).   Medical History: Past Medical History:  Diagnosis Date  . Arthritis   . CHF (congestive heart failure) (HCC)   . COPD (chronic obstructive pulmonary disease) (HCC)   . Mitral regurgitation   . Persistent atrial fibrillation (HCC)    on coumadin   . RBBB   . 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   . Severe aortic stenosis      Assessment: 87 y.o. male with h/o Afib on warfarin PTA held for TAVR workup. Pt now s/p TAVR 3/29, pharmacy asked to resume warfarin with discharge pending. Last INR was 1.2 on 3/29, home regimen is 3.5mg  daily.   Goal of Therapy:  Heparin level 0.3-0.7 units/ml Monitor platelets by anticoagulation protocol: Yes   Plan:  Warfarin 5mg  x1 tonight Daily protime   4/29, PharmD, BCPS, [facility] Clinical Pharmacist [phone] Please check  AMION for all [facility] Pharmacy numbers 11/03/2020

## 2020-11-03 NOTE — Plan of Care (Signed)
  Problem: Education: Goal: Knowledge of General Education information will improve Description: Including pain rating scale, medication(s)/side effects and non-pharmacologic comfort measures Outcome: Progressing   Problem: Education: Goal: Ability to demonstrate management of disease process will improve Outcome: Progressing Goal: Ability to verbalize understanding of medication therapies will improve Outcome: Progressing   Problem: Activity: Goal: Capacity to carry out activities will improve Outcome: Progressing   Problem: Cardiac: Goal: Ability to achieve and maintain adequate cardiopulmonary perfusion will improve Outcome: Progressing   Problem: Activity: Goal: Ability to return to baseline activity level will improve Outcome: Progressing   Problem: Cardiovascular: Goal: Ability to achieve and maintain adequate cardiovascular perfusion will improve Outcome: Progressing Goal: Vascular access site(s) Level 0-1 will be maintained Outcome: Progressing   Problem: Health Behavior/Discharge Planning: Goal: Ability to safely manage health-related needs after discharge will improve Outcome: Progressing

## 2020-11-03 NOTE — Progress Notes (Signed)
He is resting comfortably he has been working on mobilizing the left hip occasionally has pain especially after prolonged weightbearing.  Therapy was going well at nursing facility.  Hip incisions are benign neurovascular intact to the left lower extremity.  X-ray of the left hip shows stable alignment and good interval healing.  From an orthopedic standpoint hip is doing well whenever it is appropriate from a cardiac standpoint would like for him to be doing as much therapy and mobilization as possible. No orthopedic contraindication to anticoagulation would leave that up to the primary team. PT eval encouraged   Sheral Apley

## 2020-11-03 NOTE — Evaluation (Signed)
Physical Therapy Evaluation Patient Details Name: Ruben [name] MRN: [id number] DOB: May 27, 1927 Today's Date: 11/03/2020   History of Present Illness  87 y.o. male admitted on 10/28/20 with recurrent heart failure.  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  PMHx severe mitral regurgitation, HTN, COPD on home 02, longstanding persistent atrial fibrillation on warfarin, degenerative arthritis, severe aortic stenosis  and recent mechanical fall with hip fracture s/p ORIF.  Plans on discharge to son's home with spouse.  Clinical Impression  Pt admitted with/for TAVR procedure as an intervention for recurrent HF.  Pt is presently experiencing increased L Hip pain limiting ambulation, with pt needing minimal assist overall..  Pt currently limited functionally due to the problems listed below.  (see problems list.)  Pt will benefit from PT to maximize function and safety to be able to get home safely with available assist.     Follow Up Recommendations Home health PT;Supervision/Assistance - 24 hour;Supervision - Intermittent    Equipment Recommendations  None recommended by PT    Recommendations for Other Services       Precautions / Restrictions Precautions Precautions: Fall Precaution Comments: Tachy with mobility Restrictions Other Position/Activity Restrictions: hip xray this am to LLE - cleared with nursing for OOB      Mobility  Bed Mobility               General bed mobility comments: up in the recliner on arrival    Transfers Overall transfer level: Needs assistance Equipment used: Rolling walker (2 wheeled) Transfers: Sit to/from Stand Sit to Stand: Min assist         General transfer comment: stability assist and assist forward.  Pt boosted without assist  Ambulation/Gait Ambulation/Gait assistance: Min assist Gait Distance (Feet): 25 Feet Assistive device: Rolling walker (2 wheeled) Gait Pattern/deviations: Step-to pattern;Step-through pattern   Gait velocity interpretation: <1.31  ft/sec, indicative of household ambulator General Gait Details: antalgic on the L LE, mod to heavy use of the RW.  cues for sequencing with RW to decrease pain.  Stairs            Wheelchair Mobility    Modified Rankin (Stroke Patients Only)       Balance     Sitting balance-Leahy Scale: Fair       Standing balance-Leahy Scale: Poor Standing balance comment: reliant on UE support and external assist                             Pertinent Vitals/Pain Pain Assessment: 0-10 Pain Score: 5  Pain Location: L hip Pain Descriptors / Indicators: Grimacing;Guarding;Sore Pain Intervention(s): Monitored during session;Limited activity within patient's tolerance;Premedicated before session    Home Living Family/patient expects to be discharged to:: Private residence Living Arrangements: Children;Spouse/significant other Available Help at Discharge: Available 24 hours/day;Family;Personal care attendant Type of Home: House Home Access: Stairs to enter Entrance Stairs-Rails: None Entrance Stairs-Number of Steps: 2 Home Layout: Two level;Able to live on main level with bedroom/bathroom Home Equipment: Bedside commode;Grab bars - tub/shower;Hand held shower head;Shower seat;Walker - 2 wheels;Cane - single point;Hospital bed Additional Comments: Living at son's home.  Assists spouse after recent CVA.    Prior Function Level of Independence: Independent         Comments: Was receiving rehab at [facility] to address strength and endurance following L hip fracture and IMN     Hand Dominance   Dominant Hand: Right    Extremity/Trunk Assessment   Upper  Extremity Assessment Upper Extremity Assessment: Overall WFL for tasks assessed    Lower Extremity Assessment Lower Extremity Assessment: LLE deficits/detail LLE Deficits / Details: pain with w/bearing.  general weakness, but function against gravity. LLE Coordination: decreased fine motor    Cervical /  Trunk Assessment Cervical / Trunk Assessment: Kyphotic  Communication   Communication: HOH  Cognition Arousal/Alertness: Awake/alert Behavior During Therapy: WFL for tasks assessed/performed Overall Cognitive Status: Within Functional Limits for tasks assessed                                        General Comments General comments (skin integrity, edema, etc.): Tachy to 110's with mobility.  SpO2 on 3L in the imid 90's    Exercises General Exercises - Lower Extremity Long Arc Quad: AROM;10 reps;Seated;Both Hip Flexion/Marching: AROM;Left;10 reps;Seated (graded resistance hip extention)   Assessment/Plan    PT Assessment Patient needs continued PT services  PT Problem List Decreased strength;Decreased activity tolerance;Decreased mobility;Decreased knowledge of use of DME;Pain       PT Treatment Interventions DME instruction;Gait training;Functional mobility training;Therapeutic activities;Stair training;Therapeutic exercise;Balance training;Patient/family education;Neuromuscular re-education    PT Goals (Current goals can be found in the Care Plan section)  Acute Rehab PT Goals Patient Stated Goal: get stronger, less pain in hip PT Goal Formulation: With patient Time For Goal Achievement: 10/29/20 Potential to Achieve Goals: Good    Frequency Min 3X/week   Barriers to discharge        Co-evaluation               AM-PAC PT "6 Clicks" Mobility  Outcome Measure Help needed turning from your back to your side while in a flat bed without using bedrails?: A Little Help needed moving from lying on your back to sitting on the side of a flat bed without using bedrails?: A Little Help needed moving to and from a bed to a chair (including a wheelchair)?: A Little Help needed standing up from a chair using your arms (e.g., wheelchair or bedside chair)?: A Little Help needed to walk in hospital room?: A Little Help needed climbing 3-5 steps with a railing? :  A Lot 6 Click Score: 17    End of Session Equipment Utilized During Treatment: Oxygen Activity Tolerance: Patient limited by pain Patient left: in chair;with call bell/phone within reach Nurse Communication: Mobility status PT Visit Diagnosis: Unsteadiness on feet (R26.81);Muscle weakness (generalized) (M62.81);History of falling (Z91.81);Pain;Other abnormalities of gait and mobility (R26.89) Pain - Right/Left: Left Pain - part of body: Hip    Time: 4920-1007 PT Time Calculation (min) (ACUTE ONLY): 22 min   Charges:   PT Evaluation $PT Eval Moderate Complexity: 1 Mod           11/03/2020  Jacinto Halim., PT Acute Rehabilitation Services [phone]  (pager) [phone]  (office)  Eliseo Gum Rumor Sun 11/03/2020, 5:55 PM

## 2020-11-03 NOTE — Progress Notes (Addendum)
[facility]   MULTIDISCIPLINARY HEART VALVE TEAM  Patient Name: Ruben [name] Date of Encounter: 11/03/2020  Primary Cardiologist: Dr. Flora Lipps / Dr. Excell Seltzer & Dr. Laneta Simmers (TAVR)  Hospital Problem List     Principal Problem:   Acute on chronic diastolic (congestive) heart failure [facility]) Active Problems:   Atrial fibrillation, chronic (HCC)   Severe aortic stenosis   COPD (chronic obstructive pulmonary disease) (HCC)   Mitral regurgitation   Trochanteric fracture of left femur (HCC)   BPH (benign prostatic hyperplasia)   S/P TAVR (transcatheter aortic valve replacement)     Subjective   Feeling much better. Wants to go home with PT if possible. Awaiting PT consult today  Inpatient Medications    Scheduled Meds: . albuterol  2.5 mg Nebulization BID  . aspirin  81 mg Oral Daily  . Chlorhexidine Gluconate Cloth  6 each Topical Daily  . doxycycline  100 mg Oral Q12H  . dutasteride  0.5 mg Oral Daily  . feeding supplement  237 mL Oral TID WC  . ferrous gluconate  324 mg Oral QODAY  . metoprolol succinate  50 mg Oral Daily  . montelukast  10 mg Oral QHS  . potassium chloride  20 mEq Oral Daily  . potassium chloride  20 mEq Oral Q4H  . senna-docusate  2 tablet Oral Daily  . sodium chloride flush  3 mL Intravenous Q12H  . tamsulosin  0.8 mg Oral QHS  . torsemide  40 mg Oral Daily  . umeclidinium bromide  1 puff Inhalation Daily  . vitamin B-12  500 mcg Oral Daily   Continuous Infusions: . sodium chloride Stopped (11/03/20 0755)   PRN Meds: sodium chloride, acetaminophen **OR** acetaminophen, diclofenac Sodium, morphine injection, ondansetron (ZOFRAN) IV, oxyCODONE, polyethylene glycol, sodium chloride flush, traMADol   Vital Signs    Vitals:   11/03/20 0737 11/03/20 0740 11/03/20 0800 11/03/20 0900  BP:   (!) 106/91   Pulse:   96 82  Resp:   18 19  Temp:  97.9 F (36.6 C)    TempSrc:  Oral    SpO2: 100% 100% 100% 96%  Weight:      Height:         Intake/Output Summary (Last 24 hours) at 11/03/2020 0940 Last data filed at 11/03/2020 4098 Gross per 24 hour  Intake 1058.81 ml  Output 1700 ml  Net -641.19 ml   Filed Weights   11/01/20 1344 11/02/20 0500 11/03/20 0600  Weight: 85.4 kg 86.1 kg 84.7 kg    Physical Exam   GEN: Well nourished, well developed, in no acute distress.  HEENT: Grossly normal.  Neck: Supple, no JVD, carotid bruits, or masses. Cardiac: irreg irreg, 2/6 holosystolic murmur at apex. No rubs, or gallops. No clubbing, cyanosis, trace bilateral LE edema. SCDs on and left foot in a pressure wrap  Respiratory:  Respirations regular and unlabored, clear to auscultation bilaterally. GI: Soft, nontender, nondistended, BS + x 4. MS: no deformity or atrophy. Skin: warm and dry, no rash.  Groin sites clear without hematoma or ecchymosis. Neuro:  Strength and sensation are intact. Psych: AAOx3.  Normal affect.  Labs    CBC Recent Labs    11/02/20 0429 11/03/20 0118  WBC 6.5 6.5  HGB 9.3* 9.1*  HCT 30.3* 28.7*  MCV 100.0 99.0  PLT 181 150   Basic Metabolic Panel Recent Labs    11/91/47 1755 11/02/20 0429 11/03/20 0118  NA 138 135 136  K 3.8 3.9  3.5  CL 95* 93* 93*  CO2 37* 36* 38*  GLUCOSE 79 92 109*  BUN 20 17 20   CREATININE 0.79 0.75 0.85  CALCIUM 8.5* 8.6* 8.3*  MG 1.7 1.6*  --    Liver Function Tests Recent Labs    10/31/20 0958  AST 22  ALT 11  ALKPHOS 122  BILITOT 0.9  PROT 7.0  ALBUMIN 3.2*   No results for input(s): LIPASE, AMYLASE in the last 72 hours. Cardiac Enzymes No results for input(s): CKTOTAL, CKMB, CKMBINDEX, TROPONINI in the last 72 hours. BNP Invalid input(s): POCBNP D-Dimer No results for input(s): DDIMER in the last 72 hours. Hemoglobin A1C Recent Labs    11/01/20 0347  HGBA1C 5.4   Fasting Lipid Panel No results for input(s): CHOL, HDL, LDLCALC, TRIG, CHOLHDL, LDLDIRECT in the last 72 hours. Thyroid Function Tests No results for input(s): TSH,  T4TOTAL, T3FREE, THYROIDAB in the last 72 hours.  Invalid input(s): FREET3  Telemetry    Afib with underlying RBBB, some tachycardia with HRs up to 120s.  - Personally Reviewed  ECG    afib with old, stable RBBB, HR 90- Personally Reviewed  Radiology    ECHOCARDIOGRAM LIMITED  Result Date: 11/02/2020    ECHOCARDIOGRAM REPORT   Patient Name:   Ruben [name] Date of Exam: 11/02/2020 Medical Rec #:  11/04/2020    Height:       67.0 in Accession #:    [id number]   Weight:       189.8 lb Date of Birth:  Apr 10, 1927     BSA:          1.978 m Patient Age:    87 years     BP:           120/50 mmHg Patient Gender: M            HR:           80 bpm. Exam Location:  Inpatient Procedure: Limited Echo, Limited Color Doppler and Cardiac Doppler Indications:    Post TAVR Evaluation Z95.2  History:        Patient has prior history of Echocardiogram examinations, most                 recent 11/01/2020. CHF; COPD.                 Aortic Valve: 26 mm Sapien prosthetic, stented (TAVR) valve is                 present in the aortic position. Procedure Date: 11/01/2020.  Sonographer:    11/03/2020 RDCS (AE) Referring Phys: Thurman Coyer [id number] THOMPSON IMPRESSIONS  1. Left ventricular ejection fraction, by estimation, is 55 to 60%. The left ventricle has normal function. The left ventricle has no regional wall motion abnormalities. Left ventricular diastolic parameters are indeterminate.  2. Right ventricular systolic function is mildly reduced. The right ventricular size is mildly enlarged.  3. Left atrial size was mildly dilated.  4. Right atrial size was moderately dilated.  5. The pericardial effusion is posterior and lateral to the left ventricle.  6. Splay artifact previous TEE suggests small flail P2 segment with anteriorly directed MR. The mitral valve is abnormal. Moderate mitral valve regurgitation. No evidence of mitral stenosis. Severe mitral annular calcification.  7. Tricuspid valve regurgitation is moderate.   8. Post TAVR with 26 mm Sapien 3 valve no PVL mean gradient 8 peak 14 mmHg AVA 2.3 cm2 DVI 0.44. The  aortic valve has been repaired/replaced. Aortic valve regurgitation is not visualized. No aortic stenosis is present. There is a 26 mm Sapien prosthetic  (TAVR) valve present in the aortic position. Procedure Date: 11/01/2020.  9. The inferior vena cava is normal in size with greater than 50% respiratory variability, suggesting right atrial pressure of 3 mmHg. FINDINGS  Left Ventricle: Left ventricular ejection fraction, by estimation, is 55 to 60%. The left ventricle has normal function. The left ventricle has no regional wall motion abnormalities. The left ventricular internal cavity size was normal in size. There is  no left ventricular hypertrophy. Left ventricular diastolic parameters are indeterminate. Right Ventricle: The right ventricular size is mildly enlarged. Right vetricular wall thickness was not assessed. Right ventricular systolic function is mildly reduced. Left Atrium: Left atrial size was mildly dilated. Right Atrium: Right atrial size was moderately dilated. Pericardium: Trivial pericardial effusion is present. The pericardial effusion is posterior and lateral to the left ventricle. Mitral Valve: Splay artifact previous TEE suggests small flail P2 segment with anteriorly directed MR. The mitral valve is abnormal. There is severe thickening of the mitral valve leaflet(s). There is severe calcification of the mitral valve leaflet(s). Severe mitral annular calcification. Moderate mitral valve regurgitation. No evidence of mitral valve stenosis. MV peak gradient, 13.2 mmHg. The mean mitral valve gradient is 4.5 mmHg. Tricuspid Valve: The tricuspid valve is normal in structure. Tricuspid valve regurgitation is moderate . No evidence of tricuspid stenosis. Aortic Valve: Post TAVR with 26 mm Sapien 3 valve no PVL mean gradient 8 peak 14 mmHg AVA 2.3 cm2 DVI 0.44. The aortic valve has been  repaired/replaced. Aortic valve regurgitation is not visualized. No aortic stenosis is present. Aortic valve mean gradient measures 7.5 mmHg. Aortic valve peak gradient measures 13.7 mmHg. Aortic valve area, by VTI measures 2.34 cm. There is a 26 mm Sapien prosthetic, stented (TAVR) valve present in the aortic position. Procedure Date: 11/01/2020. Pulmonic Valve: The pulmonic valve was normal in structure. Pulmonic valve regurgitation is not visualized. No evidence of pulmonic stenosis. Aorta: The aortic root is normal in size and structure. Venous: The inferior vena cava is normal in size with greater than 50% respiratory variability, suggesting right atrial pressure of 3 mmHg. IAS/Shunts: The interatrial septum was not well visualized.  LEFT VENTRICLE PLAX 2D LVOT diam:     2.60 cm LV SV:         80 LV SV Index:   40 LVOT Area:     5.31 cm  AORTIC VALVE AV Area (Vmax):    2.32 cm AV Area (Vmean):   2.28 cm AV Area (VTI):     2.34 cm AV Vmax:           184.75 cm/s AV Vmean:          130.250 cm/s AV VTI:            0.342 m AV Peak Grad:      13.7 mmHg AV Mean Grad:      7.5 mmHg LVOT Vmax:         80.70 cm/s LVOT Vmean:        55.900 cm/s LVOT VTI:          0.151 m LVOT/AV VTI ratio: 0.44 MITRAL VALVE             TRICUSPID VALVE MV Area (PHT): 2.22 cm  TR Peak grad:   38.2 mmHg MV Area VTI:   1.80 cm  TR Vmax:  309.00 cm/s MV Peak grad:  13.2 mmHg MV Mean grad:  4.5 mmHg  SHUNTS MV Vmax:       1.82 m/s  Systemic VTI:  0.15 m MV Vmean:      96.4 cm/s Systemic Diam: 2.60 cm Charlton Haws MD Electronically signed by Charlton Haws MD Signature Date/Time: 11/02/2020/11:08:31 AM    Final    ECHOCARDIOGRAM LIMITED  Result Date: 11/01/2020    ECHOCARDIOGRAM LIMITED REPORT   Patient Name:   Ruben [name] Date of Exam: 11/01/2020 Medical Rec #:  [id number]    Height:       66.0 in Accession #:    [id number]   Weight:       189.4 lb Date of Birth:  [birth year]     BSA:          1.954 m Patient Age:    87 years     BP:            114/58 mmHg Patient Gender: M            HR:           82 bpm. Exam Location:  Inpatient Procedure: Limited Echo, Echo Assisted Procedure, Limited Color Doppler and            Cardiac Doppler Indications:     Aortic Stenosis; TAVR Procedure  History:         Patient has prior history of Echocardiogram examinations, most                  recent 10/29/2020. CHF; COPD.                  Aortic Valve: 26 mm Edwards Sapien prosthetic, stented (TAVR)                  valve is present in the aortic position. Procedure Date:                  11/01/2020.  Sonographer:     Thurman Coyer RDCS (AE) Referring Phys:  [id number] Janetta Hora Diagnosing Phys: Charlton Haws MD IMPRESSIONS  1. Left ventricular ejection fraction, by estimation, is 60 to 65%. The left ventricle has normal function. There is moderate left ventricular hypertrophy.  2. Left atrial size was severely dilated.  3. Right atrial size was moderately dilated.  4. A small pericardial effusion is present. The pericardial effusion is posterior and lateral to the left ventricle.  5. Known small flail P2 segemnt by TEE with "splay" artifact and large anteriorly directed jet no fully characterized on this TAVR limited study . The mitral valve is degenerative. Moderate to severe mitral valve regurgitation. Severe mitral annular calcification.  6. Tricuspid valve regurgitation is moderate.  7. Pre TAVR: tri leaflet severely calcified peak velocity 3.8 m/sec mean gradient 37 mmHg peak 59 mmHg AVA 0.45 cm2         Post TAVR: well positioned 26 mm Sapien 3 valve no PVL peak gradient 8 mean 4 mmHg with AVA 2.2 cm2. The aortic valve has been repaired/replaced. Aortic valve regurgitation is mild. Severe aortic valve stenosis. There is a 26 mm Edwards Sapien prosthetic (TAVR) valve present in the aortic position. Procedure Date: 11/01/2020. FINDINGS  Left Ventricle: Left ventricular ejection fraction, by estimation, is 60 to 65%. The left ventricle has normal  function. The left ventricular internal cavity size was small. There is moderate left ventricular hypertrophy. Left Atrium: Left atrial size was severely dilated. Right  Atrium: Right atrial size was moderately dilated. Pericardium: A small pericardial effusion is present. The pericardial effusion is posterior and lateral to the left ventricle. Mitral Valve: Known small flail P2 segemnt by TEE with "splay" artifact and large anteriorly directed jet no fully characterized on this TAVR limited study. The mitral valve is degenerative in appearance. There is severe thickening of the mitral valve leaflet(s). There is severe calcification of the mitral valve leaflet(s). Severe mitral annular calcification. Moderate to severe mitral valve regurgitation, with anteriorly-directed jet. Tricuspid Valve: Tricuspid valve regurgitation is moderate. Aortic Valve: Pre TAVR: tri leaflet severely calcified peak velocity 3.8 m/sec mean gradient 37 mmHg peak 59 mmHg AVA 0.45 cm2 Post TAVR: well positioned 26 mm Sapien 3 valve no PVL peak gradient 8 mean 4 mmHg with AVA 2.2 cm2. The aortic valve has been repaired/replaced. Aortic valve regurgitation is mild. Severe aortic stenosis is present. Aortic valve mean gradient measures 4.0 mmHg. Aortic valve peak gradient measures 7.6 mmHg. Aortic valve area, by VTI measures 2.17 cm. There is a 26 mm Edwards Sapien prosthetic, stented (TAVR) valve present in the aortic position. Procedure Date: 11/01/2020. LEFT VENTRICLE PLAX 2D LVOT diam:     2.10 cm LV SV:         55 LV SV Index:   28 LVOT Area:     3.46 cm  AORTIC VALVE AV Area (Vmax):    1.53 cm AV Area (Vmean):   1.69 cm AV Area (VTI):     2.17 cm AV Vmax:           138.00 cm/s AV Vmean:          88.300 cm/s AV VTI:            0.254 m AV Peak Grad:      7.6 mmHg AV Mean Grad:      4.0 mmHg LVOT Vmax:         61.10 cm/s LVOT Vmean:        43.100 cm/s LVOT VTI:          0.159 m LVOT/AV VTI ratio: 0.63  SHUNTS Systemic VTI:  0.16 m  Systemic Diam: 2.10 cm Charlton Haws MD Electronically signed by Charlton Haws MD Signature Date/Time: 11/01/2020/12:48:42 PM    Final    Structural Heart Procedure  Result Date: 11/01/2020 See surgical note for result.   Cardiac Studies   TAVR OPERATIVE NOTE   Date of Procedure:                11/01/2020  Preoperative Diagnosis:      Severe Aortic Stenosis   Postoperative Diagnosis:    Same   Procedure:        Transcatheter Aortic Valve Replacement - Percutaneous  Transfemoral Approach             Edwards Sapien 3 Ultra THV (size 26 mm, model # 9750TFX, serial # [id number])              Co-Surgeons:                        Alleen Borne, MD and Tonny Bollman, MD  Anesthesiologist:                  Lewie Loron, MD  Echocardiographer:              Charlton Haws, MD  Pre-operative Echo Findings: ? Severe aortic stenosis ? Normal left ventricular systolic function ? 3+  MR  Post-operative Echo Findings: ? No paravalvular leak ? Normal/unchanged left ventricular systolic function  ________________________   Echo 11/02/20:  IMPRESSIONS  1. Left ventricular ejection fraction, by estimation, is 55 to 60%. The  left ventricle has normal function. The left ventricle has no regional  wall motion abnormalities. Left ventricular diastolic parameters are  indeterminate.  2. Right ventricular systolic function is mildly reduced. The right  ventricular size is mildly enlarged.  3. Left atrial size was mildly dilated.  4. Right atrial size was moderately dilated.  5. The pericardial effusion is posterior and lateral to the left  ventricle.  6. Splay artifact previous TEE suggests small flail P2 segment with  anteriorly directed MR. The mitral valve is abnormal. Moderate mitral  valve regurgitation. No evidence of mitral stenosis. Severe mitral annular  calcification.  7. Tricuspid valve regurgitation is moderate.  8. Post TAVR with 26 mm Sapien 3 valve no PVL  mean gradient 8 peak 14  mmHg AVA 2.3 cm2 DVI 0.44. The aortic valve has been repaired/replaced.  Aortic valve regurgitation is not visualized. No aortic stenosis is  present. There is a 26 mm Sapien prosthetic  (TAVR) valve present in the aortic position. Procedure Date: 11/01/2020.  9. The inferior vena cava is normal in size with greater than 50%  respiratory variability, suggesting right atrial pressure of 3 mmHg.   Patient Profile     Marten Iles [name] a 87 y.o.malewithRBBB, severemitral regurgitation, HTN, COPDon home 02, longstanding persistent atrial fibrillation on warfarin, degenerative arthritis, severe aortic stenosiswith plans forTAVR, and recent mechanical fall with hip fracture s/p ORIFand admission for acute on chronic diastolic CHFwho was admitted on 10/28/20 with recurrent heart failure.  Assessment & Plan    Acute on chronic diastolic CHF: admitted 3/25 with acute CHF and has done quite well with aggressive diuresis. Weight down from 195 to 186 lbs ( previous baseline felt to be 190lbs). Net negative 5.2L. Restarted on Torsemide 40mg  daily yesterday with good urine output. Creat has remained normal. Transfer orders placed for when bed is available on 4E.  Severe AS: s/p successful TAVR with a 26 mm Edwards Sapien 3 Ultra THV via the TF approach on 11/01/20. Post operative echo showed EF 55%, normally functioning TAVR with a mean gradient of 8 mmHg and no PVL. There is moderate MR/TR and trivial pericardial effusion. Groin sites are stable. ECG with chronic afib with RBBB (old) and no high grade heart block. Will resume Coumadin tonight.   Chronic afib: had profound bradycardia with slow VR in initial post op period. Temp wire left in place. Now HR recovered into 90s and temp pacer pulled. HRs have been 90s-120s with activity. Will cautiously restart home Toprol XL 50mg  daily. Will plan to discharge home with a Zio AT  Moderate to severe MR: we are hopeful this will  improve with TAVR. Echo yesterday read as moderate MR. Anatomy not amendable for TEER.  COPD: continued on home inhalers.   Left heel ulcer: this is chronic and has been bandaged. Pt complains of significant pain here. He has been started on oral doxycycline x 7 days.   Hip fracture s/p ORIF:this has significantly set him back. Was previously at [facility] place for rehab. Pt has made some more progress and wants to consider going home with son and HHPT. Will get a PT consult to weigh in on disposition.  , PA-C  11/03/2020, 9:40 AM  Pager [phone]  Patient seen, examined. Available data  reviewed. Agree with findings, assessment, and plan as outlined by Carlean JewsKatie Thompson, PA-C. The patient is independently interviewed and examined. He is an elderly male in NAD. Lungs CTA, heart irregularly irregular with a 3/6 HSM at the apex, BL groin sites clear, no leg edema. Tele shows atrial fibrillation with HR 80-100 bpm. Echo reviewed and shows normal transcatheter heart valve function. Pt progressing well. Likely to return to ST-SNF prior to home because of weakness, limited mobility. Appreciate PT assessment. Pt back on warfarin and home medications. Anticipate DC next 24 hours.   Tonny BollmanMichael Jamyria Ozanich, M.D. 11/03/2020 10:09 PM

## 2020-11-03 NOTE — Progress Notes (Signed)
Mobility Specialist: Progress Note   11/03/20 1327  Mobility  Activity Ambulated in room  Level of Assistance Minimal assist, patient does 75% or more  Assistive Device Four wheel walker (EVA)  Distance Ambulated (ft) 2 ft  Mobility Response Tolerated poorly  Mobility performed by Mobility specialist  Bed Position Chair  $Mobility charge 1 Mobility   Pre-Mobility: 106 HR, 96% SpO2  Pt was limited today due to pain in LLE, no rating given. Pt c/o pain in his L thigh and L groin incision site. Pt back to chair with gown and sock change with assistance from RN. Encouraged pt to attempt to walk later today with staff.  [facility] Ruben [name] Mobility Specialist Mobility Specialist Phone: [phone]

## 2020-11-04 ENCOUNTER — Telehealth: Payer: Self-pay | Admitting: Physician Assistant

## 2020-11-04 ENCOUNTER — Inpatient Hospital Stay (INDEPENDENT_AMBULATORY_CARE_PROVIDER_SITE_OTHER): Payer: Medicare Other

## 2020-11-04 DIAGNOSIS — I4510 Unspecified right bundle-branch block: Secondary | ICD-10-CM | POA: Insufficient documentation

## 2020-11-04 DIAGNOSIS — I482 Chronic atrial fibrillation, unspecified: Secondary | ICD-10-CM

## 2020-11-04 DIAGNOSIS — Z952 Presence of prosthetic heart valve: Secondary | ICD-10-CM | POA: Diagnosis not present

## 2020-11-04 LAB — CBC
HCT: 29.7 % — ABNORMAL LOW (ref 39.0–52.0)
Hemoglobin: 9.3 g/dL — ABNORMAL LOW (ref 13.0–17.0)
MCH: 31.8 pg (ref 26.0–34.0)
MCHC: 31.3 g/dL (ref 30.0–36.0)
MCV: 101.7 fL — ABNORMAL HIGH (ref 80.0–100.0)
Platelets: 154 10*3/uL (ref 150–400)
RBC: 2.92 MIL/uL — ABNORMAL LOW (ref 4.22–5.81)
RDW: 16.1 % — ABNORMAL HIGH (ref 11.5–15.5)
WBC: 6.1 10*3/uL (ref 4.0–10.5)
nRBC: 0 % (ref 0.0–0.2)

## 2020-11-04 LAB — BASIC METABOLIC PANEL
Anion gap: 4 — ABNORMAL LOW (ref 5–15)
BUN: 26 mg/dL — ABNORMAL HIGH (ref 8–23)
CO2: 41 mmol/L — ABNORMAL HIGH (ref 22–32)
Calcium: 8.5 mg/dL — ABNORMAL LOW (ref 8.9–10.3)
Chloride: 92 mmol/L — ABNORMAL LOW (ref 98–111)
Creatinine, Ser: 0.8 mg/dL (ref 0.61–1.24)
GFR, Estimated: >60 mL/min (ref >60)
Glucose, Bld: 116 mg/dL — ABNORMAL HIGH (ref 70–99)
Potassium: 3.6 mmol/L (ref 3.5–5.1)
Sodium: 137 mmol/L (ref 135–145)

## 2020-11-04 LAB — SARS CORONAVIRUS 2 (TAT 6-24 HRS): SARS Coronavirus 2: NEGATIVE

## 2020-11-04 LAB — PROTIME-INR
INR: 1.2 (ref 0.8–1.2)
Prothrombin Time: 14.8 seconds (ref 11.4–15.2)

## 2020-11-04 MED ORDER — WARFARIN SODIUM 5 MG PO TABS
5.0000 mg | ORAL_TABLET | Freq: Once | ORAL | Status: AC
  Administered 2020-11-04: 5 mg via ORAL
  Filled 2020-11-04: qty 1

## 2020-11-04 MED ORDER — POTASSIUM CHLORIDE CRYS ER 10 MEQ PO TBCR: 10.0000 meq | EXTENDED_RELEASE_TABLET | Freq: Every day | ORAL | 1 refills | Status: DC

## 2020-11-04 MED ORDER — WARFARIN SODIUM 5 MG PO TABS: 5.0000 mg | ORAL_TABLET | Freq: Once | ORAL | Status: DC

## 2020-11-04 MED ORDER — DOXYCYCLINE HYCLATE 100 MG PO TABS
100.0000 mg | ORAL_TABLET | Freq: Two times a day (BID) | ORAL | 0 refills | Status: DC
Start: 2020-11-04 — End: 2020-11-05

## 2020-11-04 MED ORDER — TORSEMIDE 20 MG PO TABS: 20.0000 mg | ORAL_TABLET | Freq: Every day | ORAL | Status: DC

## 2020-11-04 MED ORDER — GUAIFENESIN ER 600 MG PO TB12
600.0000 mg | ORAL_TABLET | Freq: Two times a day (BID) | ORAL | Status: DC | PRN
  Administered 2020-11-04 (×2): 600 mg via ORAL
  Filled 2020-11-04 (×2): qty 1

## 2020-11-04 NOTE — Progress Notes (Deleted)
Ruben Ruben [name]  Discharge Summary    Patient ID: Ruben Ruben [name] MRN: [id number]; DOB: 21-Apr-1927  Admit date: 10/28/2020 Discharge date: 11/04/2020  Primary Care Provider: Alroy [name], Ruben Sa, MD  Primary Cardiologist: Ruben Field, MD / Dr. Burt [name] & Dr Cyndia Bent (TAVR)  Discharge Diagnoses    Principal Problem:   Acute on chronic diastolic (congestive) heart failure ([place]) Active Problems:   Atrial fibrillation, chronic (HCC)   Severe aortic stenosis   COPD (chronic obstructive pulmonary disease) (HCC)   Mitral regurgitation   Trochanteric fracture of left femur (HCC)   BPH (benign prostatic hyperplasia)   S/P TAVR (transcatheter aortic valve replacement)   RBBB   Allergies No Known Allergies  Diagnostic Studies/Procedures    TAVR OPERATIVE NOTE   Date of Procedure:11/01/2020  Preoperative Diagnosis:Severe Aortic Stenosis   Postoperative Diagnosis:Same   Procedure:   Transcatheter Aortic Valve Replacement - Percutaneous Transfemoral Approach Ruben Ruben [name] 3 UltraTHV (size75m, model # [id number] serial ##[id number]  Co-Surgeons:Ruben KAlveria Apley MD and Ruben Mocha MD  Anesthesiologist:Ruben GLissa Hoard MD  Echocardiographer:Ruben NJohnsie Cancel MD  Pre-operative Echo Findings: ? Severe aortic stenosis ? Normalleft ventricular systolic function ? 3+ MR  Post-operative Echo Findings: ? Noparavalvular leak ? Normal/unchangedleft ventricular systolic function  ________________________   Echo 11/02/20:  IMPRESSIONS  1. Left ventricular ejection fraction, by estimation, is 55 to 60%. The  left ventricle has normal function. The left ventricle has no regional  wall motion abnormalities. Left ventricular diastolic parameters are  indeterminate.  2. Right ventricular systolic function is mildly  reduced. The right  ventricular size is mildly enlarged.  3. Left atrial size was mildly dilated.  4. Right atrial size was moderately dilated.  5. The pericardial effusion is posterior and lateral to the left  ventricle.  6. Splay artifact previous TEE suggests small flail P2 segment with  anteriorly directed MR. The mitral valve is abnormal. Moderate mitral  valve regurgitation. No evidence of mitral stenosis. Severe mitral annular  calcification.  7. Tricuspid valve regurgitation is moderate.  8. Post TAVR with 26 mm [name] 3 valve no PVL mean gradient 8 peak 14  mmHg AVA 2.3 cm2 DVI 0.44. The aortic valve has been repaired/replaced.  Aortic valve regurgitation is not visualized. No aortic stenosis is  present. There is a 26 mm [name] prosthetic  (TAVR) valve present in the aortic position. Procedure Date: 11/01/2020.  9. The inferior vena cava is normal in size with greater than 50%  respiratory variability, suggesting right atrial pressure of 3 mmHg.    History of Present Illness     Ruben Ruben [name] a 87y.o.malewithRBBB, severemitral regurgitation, HTN, COPDon home 02, longstanding persistent atrial fibrillation on warfarin, degenerative arthritis, severe aortic stenosiswith plans forTAVR, and recent mechanical fall with hip fracture s/p ORIFand admission for acute on chronic diastolic CHFwhowas admitted on 3/25/22with recurrent heart failure.  The patient is well known to our structural heart Ruben [name]. He has known severe aortic stenosis and was [place]. Upon initial consultation he was a functionally independent and remarkably active for a gentleman for his age.  [place], Ruben Ruben [name]( a [employer]. In12/2021 he was hospitalized with acute hypoxic respiratory failure felt related to a combination of COPD with acute exacerbation, rhinovirus infection, and acute on chronic  diastolic congestive heart failure. Symptoms improved with antibiotics, diuresis, and nebulized bronchodilators. Transthoracic echocardiogram performed  at that time revealed EF 55%, mean grad 37 mmHg, peak grad 63.4 mmHg, AVA 0.87 cm2, DVI 0.18, as well as severe MAC with mild MS/MR. He was ultimately discharged home on home oxygen therapy.   He was referred to the multidisciplinary heart valve clinic and was evaluated by Dr. Burt [name]. Diagnostic cardiac catheterization 08/24/20 revealed diffuse calcific nonobstructive coronary artery disease with severe aortic stenosis and mild pulmonary hypertension. There was some question about splay artifact and severe MR and TEE was recommended. TEE 09/07/20 confirmed severe AS and revealed severe mitral regurgitation with a small flail segment of the middle scallop of the posterior leaflet and an eccentric jet of mitral regurgitation with flow reversal in the pulmonary veins. There was severe mitral annular calcification and functional anatomy of the valve did not appear amenable to transcatheter edge-to-edge repair.   The patient has been extensively evaluated by the multidisciplinary valve Ruben [name] and felt to have severe, symptomatic aortic stenosis and to be a suitable candidate for TAVR, which was originally set up for 09/20/20. On his way to the hospital for his valve surgery on 2/15, the patient suffered a mechanical fall while trying to get into the car that resulted in a hip fracture. He underwent successful ORIF by Dr. Percell [name] on 09/21/20. He was discharged to [place] home for short term rehabilitation. I saw him in the office on 10/12/20 for follow up. He was noted to be in acute heart failure with 20 lb weight gain (up to 212lbs with baseline weight of 190lbs) and making slow progress with hip rehabilitation. Outpatient diuresis was attempted but he was ultimately admitted from 3/10-3/14/22 for IV diuresis, discharge weight 201.7 lbs.I saw him in the office  on 3/17 after discharge and his volume status improved on Torsemide 85m daily (previously 20 mg daily). I increased his Torsemide to 851mx 2 days to improve his volume status. I saw him for close follow up on 3/23. He remained volume overloaded but weight slowly decreasing (down to 199 lbs). He was overall doing much better with less shortness of breath and improving functional status. He was able to ambulate with his walker. He and his son were eager to proceed with TAVR and this was set up for next Tuesday 3/29. I did increase Torsemide to 60 mg daily x 2 days, however, pt developed worsening shortness of breath [facility]. In the ER CXR showed CHF, BNP 498.5, creat 0.96, K 3.3, Hg 10.2. He was treated with Lasix 4035m1 and KDur 80m59mCardiology asked for admission.   Hospital Course     Consultants: none   Acute on chronic diastolic CHF: admitted 3/252/62h acute CHF and has done quite well with aggressive diuresis. Weight down from 195 to 186 lbs (previous baseline felt to be 190lbs). Net negative 5.7L. Restarted on Torsemide 40mg37mly 3/30 with good urine output. Creat has remained normal. BP on lower side today and seems a bit more lethargic, will cut torsemide back to 80mg 24my. I will see him back in the office next week.   Severe AS:s/p successful TAVR with a 26 mm Ruben Ruben [name] 3 Ultra THV via the TF approach on 11/01/20. Post operative echo showed EF 55%, normally functioning TAVR with a mean gradient of 8 mmHg and no PVL. There is moderate MR/TR and trivial pericardial effusion. Groin sites are stable. ECG with chronic afib with RBBB (old) and no high grade heart block. Back on  home Coumadin. Will not add concurrent antiplatelet therapy given chronic anemia and advanced age  Chronic afib:had profound bradycardia with slow VR in initial post op period. Temp wire left in place. Now HR recovered into 90s and temp pacer pulled. HRs became elevated  and he was resumed on home Toprol XL with no recurrent bradycardia. Will plan to discharge back to [facility] with a [facility] AT. PCP checks INR usually but I will check in next week for pt convenience.   Moderate to severe MR: post op echo reported moderate MR. Anatomy not amendable for TEER.  COPD: continued on home inhalers.  Left heel ulcer: this is chronic and has been bandaged. Pt complains of significant pain here. He has been started on oral doxycycline x10 days. He will need to complete 5 more days at discharge  Hip fracture s/p ORIF:this has significantly set him back. Was previously at [facility] place for rehab. Seen by Dr. Percell [name] while admitted. Hip healing well. Continued PT recommended. He will need to go back to [place] place for continued rehab.  _____________  Discharge Vitals Blood pressure (!) 98/53, pulse 88, temperature 98.1 F (36.7 C), temperature source Oral, resp. rate 18, height _0  (1.702 m), weight 76.6 kg, SpO2 96 %.  Filed Weights   11/02/20 0500 11/03/20 0600 11/04/20 0300  Weight: 86.1 kg 84.7 kg 76.6 kg    GEN: Well nourished, well developed, in no acute distress HEENT: normal Neck: no JVD or masses Cardiac: irreg irreg; 3/6 HSM at apex. No rubs, or gallops,no edema  Respiratory:  clear to auscultation bilaterally, normal work of breathing GI: soft, nontender, nondistended, + BS MS: no deformity or atrophy Skin: warm and dry, no rash.  Groin sites clear without hematoma or ecchymosis  Neuro:  Alert and Oriented x 3, Strength and sensation are intact Psych: euthymic mood, full affect    Labs & Radiologic Studies    CBC Recent Labs    11/03/20 0118 11/04/20 0149  WBC 6.5 6.1  HGB 9.1* 9.3*  HCT 28.7* 29.7*  MCV 99.0 101.7*  PLT 150 465   Basic Metabolic Panel Recent Labs    11/01/20 1755 11/02/20 0429 11/03/20 0118 11/04/20 0149  NA 138 135 136 137  K 3.8 3.9 3.5 3.6  CL 95* 93* 93* 92*  CO2 37* 36* 38* 41*  GLUCOSE 79 92 109*  116*  BUN _1 26*  CREATININE 0.79 0.75 0.85 0.80  CALCIUM 8.5* 8.6* 8.3* 8.5*  MG 1.7 1.6*  --   --    Liver Function Tests No results for input(s): AST, ALT, ALKPHOS, BILITOT, PROT, ALBUMIN in the last 72 hours. No results for input(s): LIPASE, AMYLASE in the last 72 hours. Cardiac Enzymes No results for input(s): CKTOTAL, CKMB, CKMBINDEX, TROPONINI in the last 72 hours. BNP Invalid input(s): POCBNP D-Dimer No results for input(s): DDIMER in the last 72 hours. Hemoglobin A1C No results for input(s): HGBA1C in the last 72 hours. Fasting Lipid Panel No results for input(s): CHOL, HDL, LDLCALC, TRIG, CHOLHDL, LDLDIRECT in the last 72 hours. Thyroid Function Tests No results for input(s): TSH, T4TOTAL, T3FREE, THYROIDAB in the last 72 hours.  Invalid input(s): FREET3 _____________  DG Chest 2 View  Result Date: 10/31/2020 CLINICAL DATA:  Shortness of breath, severe aortic stenosis EXAM: CHEST - 2 VIEW COMPARISON:  Portable exam of 10/28/2020 FINDINGS: Enlargement of cardiac silhouette with pulmonary vascular congestion. Atherosclerotic calcification aorta. Scattered pulmonary infiltrates likely representing pulmonary edema. Small bibasilar pleural  effusions and adjacent basilar atelectasis. No pneumothorax. IMPRESSION: Enlargement of cardiac silhouette with pulmonary vascular congestion and BILATERAL pulmonary infiltrates consistent with pulmonary edema. Bibasilar pleural effusions and atelectasis. Aortic Atherosclerosis (ICD10-I70.0). Electronically Signed   By: Lavonia Dana M.D.   On: 10/31/2020 08:29   DG Chest Port 1 View  Result Date: 10/28/2020 CLINICAL DATA:  Shortness of breath EXAM: PORTABLE CHEST 1 VIEW COMPARISON:  Fifteen days ago FINDINGS: Cardiomegaly and vascular pedicle widening with congested vessels and bilateral pleural effusion. There is diffuse interstitial opacity and streaky/hazy density at the bases. Dense calcification of the mitral annulus. No pneumothorax.  IMPRESSION: CHF pattern. Electronically Signed   By: Monte Fantasia M.D.   On: 10/28/2020 06:53   DG Chest Portable 1 View  Result Date: 10/13/2020 CLINICAL DATA:  Shortness of breath and lower extremity edema. EXAM: PORTABLE CHEST 1 VIEW COMPARISON:  October 13, 2020 (5:17 p.m.) FINDINGS: Stable moderate to marked severity diffusely increased interstitial lung markings are noted. A small left pleural effusion is suspected. No pneumothorax is identified. The cardiac silhouette is markedly enlarged. Marked severity calcification of the aortic arch is seen. Degenerative changes are noted throughout the thoracic spine. IMPRESSION: Cardiomegaly with pulmonary edema, stable in severity when compared to the prior study. Electronically Signed   By: Virgina Norfolk M.D.   On: 10/13/2020 22:51   DG Chest Port 1 View  Result Date: 10/13/2020 CLINICAL DATA:  Shortness of breath.  Lower extremity edema. EXAM: PORTABLE CHEST 1 VIEW COMPARISON:  09/16/2020 FINDINGS: Chronic cardiomegaly. Unchanged mediastinal contours with aortic atherosclerosis. Interstitial thickening and Kerley B-lines consistent with pulmonary edema. Small bilateral pleural effusions are similar to prior exam. No pneumothorax. No evidence of pneumonia. IMPRESSION: 1. Pulmonary edema is new from last month. 2. Cardiomegaly and bilateral pleural effusions appear similar. Electronically Signed   By: Keith Rake M.D.   On: 10/13/2020 17:58   DG HIP PORT UNILAT WITH PELVIS 1V LEFT  Result Date: 11/03/2020 CLINICAL DATA:  Prior ORIF. EXAM: DG HIP (WITH OR WITHOUT PELVIS) 1V PORT LEFT COMPARISON:  09/20/2020. FINDINGS: Prior ORIF left hip. Hardware intact. Anatomic alignment. Left femoral neck fracture lucency again identified. Fracture fragments adjacent to the left hip again noted. Degenerative change left hip again noted. Probable bone island left acetabulum again noted. No new abnormality. Pelvic calcifications consistent phleboliths.  Peripheral vascular calcification. IMPRESSION: Prior ORIF left hip. Anatomic alignment. Exam appears stable from prior exam. Electronically Signed   By: Marcello Moores  Register   On: 11/03/2020 11:58   ECHOCARDIOGRAM LIMITED  Result Date: 11/02/2020    ECHOCARDIOGRAM REPORT   Patient Name:   Ruben Ruben [name] Date of Exam: 11/02/2020 Medical Rec #:  [id number]    Height:       67.0 in Accession #:    [id number]   Weight:       189.8 lb Date of Birth:  1926/12/06     BSA:          1.978 m Patient Age:    73 years     BP:           120/50 mmHg Patient Gender: M            HR:           80 bpm. Exam Location:  Inpatient Procedure: Limited Echo, Limited Color Doppler and Cardiac Doppler Indications:    Post TAVR Evaluation Z95.2  History:        Patient has prior history of Echocardiogram examinations,  most                 recent 11/01/2020. CHF; COPD.                 Aortic Valve: 26 mm [name] prosthetic, stented (TAVR) valve is                 present in the aortic position. Procedure Date: 11/01/2020.  Sonographer:    Mikki Santee RDCS (AE) Referring Phys: [id number] [place]  1. Left ventricular ejection fraction, by estimation, is 55 to 60%. The left ventricle has normal function. The left ventricle has no regional wall motion abnormalities. Left ventricular diastolic parameters are indeterminate.  2. Right ventricular systolic function is mildly reduced. The right ventricular size is mildly enlarged.  3. Left atrial size was mildly dilated.  4. Right atrial size was moderately dilated.  5. The pericardial effusion is posterior and lateral to the left ventricle.  6. Splay artifact previous TEE suggests small flail P2 segment with anteriorly directed MR. The mitral valve is abnormal. Moderate mitral valve regurgitation. No evidence of mitral stenosis. Severe mitral annular calcification.  7. Tricuspid valve regurgitation is moderate.  8. Post TAVR with 26 mm [name] 3 valve no PVL mean gradient 8 peak 14  mmHg AVA 2.3 cm2 DVI 0.44. The aortic valve has been repaired/replaced. Aortic valve regurgitation is not visualized. No aortic stenosis is present. There is a 26 mm [name] prosthetic  (TAVR) valve present in the aortic position. Procedure Date: 11/01/2020.  9. The inferior vena cava is normal in size with greater than 50% respiratory variability, suggesting right atrial pressure of 3 mmHg. FINDINGS  Left Ventricle: Left ventricular ejection fraction, by estimation, is 55 to 60%. The left ventricle has normal function. The left ventricle has no regional wall motion abnormalities. The left ventricular internal cavity size was normal in size. There is  no left ventricular hypertrophy. Left ventricular diastolic parameters are indeterminate. Right Ventricle: The right ventricular size is mildly enlarged. Right vetricular wall thickness was not assessed. Right ventricular systolic function is mildly reduced. Left Atrium: Left atrial size was mildly dilated. Right Atrium: Right atrial size was moderately dilated. Pericardium: Trivial pericardial effusion is present. The pericardial effusion is posterior and lateral to the left ventricle. Mitral Valve: Splay artifact previous TEE suggests small flail P2 segment with anteriorly directed MR. The mitral valve is abnormal. There is severe thickening of the mitral valve leaflet(s). There is severe calcification of the mitral valve leaflet(s). Severe mitral annular calcification. Moderate mitral valve regurgitation. No evidence of mitral valve stenosis. MV peak gradient, 13.2 mmHg. The mean mitral valve gradient is 4.5 mmHg. Tricuspid Valve: The tricuspid valve is normal in structure. Tricuspid valve regurgitation is moderate . No evidence of tricuspid stenosis. Aortic Valve: Post TAVR with 26 mm [name] 3 valve no PVL mean gradient 8 peak 14 mmHg AVA 2.3 cm2 DVI 0.44. The aortic valve has been repaired/replaced. Aortic valve regurgitation is not visualized. No aortic stenosis is  present. Aortic valve mean gradient measures 7.5 mmHg. Aortic valve peak gradient measures 13.7 mmHg. Aortic valve area, by VTI measures 2.34 cm. There is a 26 mm [name] prosthetic, stented (TAVR) valve present in the aortic position. Procedure Date: 11/01/2020. Pulmonic Valve: The pulmonic valve was normal in structure. Pulmonic valve regurgitation is not visualized. No evidence of pulmonic stenosis. Aorta: The aortic root is normal in size and structure. Venous: The inferior vena cava is normal in size  with greater than 50% respiratory variability, suggesting right atrial pressure of 3 mmHg. IAS/Shunts: The interatrial septum was not well visualized.  LEFT VENTRICLE PLAX 2D LVOT diam:     2.60 cm LV SV:         80 LV SV Index:   40 LVOT Area:     5.31 cm  AORTIC VALVE AV Area (Vmax):    2.32 cm AV Area (Vmean):   2.28 cm AV Area (VTI):     2.34 cm AV Vmax:           184.75 cm/s AV Vmean:          130.250 cm/s AV VTI:            0.342 m AV Peak Grad:      13.7 mmHg AV Mean Grad:      7.5 mmHg LVOT Vmax:         80.70 cm/s LVOT Vmean:        55.900 cm/s LVOT VTI:          0.151 m LVOT/AV VTI ratio: 0.44 MITRAL VALVE             TRICUSPID VALVE MV Area (PHT): 2.22 cm  TR Peak grad:   38.2 mmHg MV Area VTI:   1.80 cm  TR Vmax:        309.00 cm/s MV Peak grad:  13.2 mmHg MV Mean grad:  4.5 mmHg  SHUNTS MV Vmax:       1.82 m/s  Systemic VTI:  0.15 m MV Vmean:      96.4 cm/s Systemic Diam: 2.60 cm Jenkins Rouge MD Electronically signed by Jenkins Rouge MD Signature Date/Time: 11/02/2020/11:08:31 AM    Final    ECHOCARDIOGRAM LIMITED  Result Date: 11/01/2020    ECHOCARDIOGRAM LIMITED REPORT   Patient Name:   Ruben Ruben [name] Date of Exam: 11/01/2020 Medical Rec #:  [id number]    Height:       66.0 in Accession #:    [id number]   Weight:       189.4 lb Date of Birth:  1926/08/17     BSA:          1.954 m Patient Age:    51 years     BP:           114/58 mmHg Patient Gender: M            HR:           82 bpm. Exam Location:   Inpatient Procedure: Limited Echo, Echo Assisted Procedure, Limited Color Doppler and            Cardiac Doppler Indications:     Aortic Stenosis; TAVR Procedure  History:         Patient has prior history of Echocardiogram examinations, most                  recent 10/29/2020. CHF; COPD.                  Aortic Valve: 26 mm Ruben Ruben [name] prosthetic, stented (TAVR)                  valve is present in the aortic position. Procedure Date:                  11/01/2020.  Sonographer:     Mikki Santee RDCS (AE) Referring Phys:  [id number] Eileen Stanford Diagnosing Phys: Jenkins Rouge MD IMPRESSIONS  1. Left ventricular ejection fraction, by estimation, is 60 to 65%. The left ventricle has normal function. There is moderate left ventricular hypertrophy.  2. Left atrial size was severely dilated.  3. Right atrial size was moderately dilated.  4. A small pericardial effusion is present. The pericardial effusion is posterior and lateral to the left ventricle.  5. Known small flail P2 segemnt by TEE with "splay" artifact and large anteriorly directed jet no fully characterized on this TAVR limited study . The mitral valve is degenerative. Moderate to severe mitral valve regurgitation. Severe mitral annular calcification.  6. Tricuspid valve regurgitation is moderate.  7. Pre TAVR: tri leaflet severely calcified peak velocity 3.8 m/sec mean gradient 37 mmHg peak 59 mmHg AVA 0.45 cm2         Post TAVR: well positioned 26 mm [name] 3 valve no PVL peak gradient 8 mean 4 mmHg with AVA 2.2 cm2. The aortic valve has been repaired/replaced. Aortic valve regurgitation is mild. Severe aortic valve stenosis. There is a 26 mm Ruben Ruben [name] prosthetic (TAVR) valve present in the aortic position. Procedure Date: 11/01/2020. FINDINGS  Left Ventricle: Left ventricular ejection fraction, by estimation, is 60 to 65%. The left ventricle has normal function. The left ventricular internal cavity size was small. There is moderate left  ventricular hypertrophy. Left Atrium: Left atrial size was severely dilated. Right Atrium: Right atrial size was moderately dilated. Pericardium: A small pericardial effusion is present. The pericardial effusion is posterior and lateral to the left ventricle. Mitral Valve: Known small flail P2 segemnt by TEE with "splay" artifact and large anteriorly directed jet no fully characterized on this TAVR limited study. The mitral valve is degenerative in appearance. There is severe thickening of the mitral valve leaflet(s). There is severe calcification of the mitral valve leaflet(s). Severe mitral annular calcification. Moderate to severe mitral valve regurgitation, with anteriorly-directed jet. Tricuspid Valve: Tricuspid valve regurgitation is moderate. Aortic Valve: Pre TAVR: tri leaflet severely calcified peak velocity 3.8 m/sec mean gradient 37 mmHg peak 59 mmHg AVA 0.45 cm2 Post TAVR: well positioned 26 mm [name] 3 valve no PVL peak gradient 8 mean 4 mmHg with AVA 2.2 cm2. The aortic valve has been repaired/replaced. Aortic valve regurgitation is mild. Severe aortic stenosis is present. Aortic valve mean gradient measures 4.0 mmHg. Aortic valve peak gradient measures 7.6 mmHg. Aortic valve area, by VTI measures 2.17 cm. There is a 26 mm Ruben Ruben [name] prosthetic, stented (TAVR) valve present in the aortic position. Procedure Date: 11/01/2020. LEFT VENTRICLE PLAX 2D LVOT diam:     2.10 cm LV SV:         55 LV SV Index:   28 LVOT Area:     3.46 cm  AORTIC VALVE AV Area (Vmax):    1.53 cm AV Area (Vmean):   1.69 cm AV Area (VTI):     2.17 cm AV Vmax:           138.00 cm/s AV Vmean:          88.300 cm/s AV VTI:            0.254 m AV Peak Grad:      7.6 mmHg AV Mean Grad:      4.0 mmHg LVOT Vmax:         61.10 cm/s LVOT Vmean:        43.100 cm/s LVOT VTI:          0.159 m LVOT/AV VTI ratio: 0.63  SHUNTS Systemic VTI:  0.16 m Systemic Diam: 2.10 cm Jenkins Rouge MD Electronically signed by Jenkins Rouge MD Signature  Date/Time: 11/01/2020/12:48:42 PM    Final    ECHOCARDIOGRAM LIMITED  Result Date: 10/29/2020    ECHOCARDIOGRAM LIMITED REPORT   Patient Name:   Ruben Ruben [name] Date of Exam: 10/29/2020 Medical Rec #:  [id number]    Height:       67.0 in Accession #:    [id number]   Weight:       194.9 lb Date of Birth:  1927-03-23     BSA:          2.000 m Patient Age:    46 years     BP:           124/83 mmHg Patient Gender: M            HR:           89 bpm. Exam Location:  Inpatient Procedure: Limited Echo, Cardiac Doppler and Color Doppler Indications:    acute diastolic chf  History:        Patient has prior history of Echocardiogram examinations, most                 recent 09/07/2020. COPD, Aortic Valve Disease; Arrythmias:Atrial                 Fibrillation.  Sonographer:    Johny Chess Referring Phys: [id number] [place]  1. Left ventricular ejection fraction, by estimation, is 60 to 65%. The left ventricle has normal function. There is moderate concentric left ventricular hypertrophy. There is the interventricular septum is flattened in systole and diastole, consistent with right ventricular pressure and volume overload.  2. Right ventricular systolic function is moderately reduced. The right ventricular size is severely enlarged.  3. Moderate to severe mitral regurgitation is present. There is splay artifact. Refer to recent TEE for characterization. The mitral valve is degenerative. Moderate to severe mitral valve regurgitation. Severe mitral annular calcification.  4. The aortic valve is tricuspid. There is severe calcifcation of the aortic valve. There is severe thickening of the aortic valve. Severe aortic valve stenosis. Aortic valve area, by VTI measures 0.37 cm. Aortic valve mean gradient measures 36.0 mmHg.  Aortic valve Vmax measures 3.91 m/s. Comparison(s): No significant change from prior study. FINDINGS  Left Ventricle: Left ventricular ejection fraction, by estimation, is 60 to 65%.  The left ventricle has normal function. The left ventricular internal cavity size was small. There is moderate concentric left ventricular hypertrophy. The interventricular  septum is flattened in systole and diastole, consistent with right ventricular pressure and volume overload. Right Ventricle: The right ventricular size is severely enlarged. No increase in right ventricular wall thickness. Right ventricular systolic function is moderately reduced. Pericardium: Trivial pericardial effusion is present. Mitral Valve: Moderate to severe mitral regurgitation is present. There is splay artifact. Refer to recent TEE for characterization. The mitral valve is degenerative in appearance. Severe mitral annular calcification. Moderate to severe mitral valve regurgitation. Tricuspid Valve: The tricuspid valve is grossly normal. Tricuspid valve regurgitation is mild . No evidence of tricuspid stenosis. Aortic Valve: The aortic valve is tricuspid. There is severe calcifcation of the aortic valve. There is severe thickening of the aortic valve. Severe aortic stenosis is present. Aortic valve mean gradient measures 36.0 mmHg. Aortic valve peak gradient measures 61.3 mmHg. Aortic valve area, by VTI measures 0.37 cm. Pulmonic Valve: The pulmonic valve was grossly normal. Pulmonic valve regurgitation is mild. No  evidence of pulmonic stenosis. LEFT VENTRICLE PLAX 2D LVIDd:         4.70 cm LVIDs:         2.80 cm LV PW:         1.40 cm LV IVS:        1.30 cm LVOT diam:     1.80 cm LV SV:         32 LV SV Index:   16 LVOT Area:     2.54 cm  AORTIC VALVE AV Area (Vmax):    0.41 cm AV Area (Vmean):   0.39 cm AV Area (VTI):     0.37 cm AV Vmax:           391.33 cm/s AV Vmean:          278.333 cm/s AV VTI:            0.856 m AV Peak Grad:      61.3 mmHg AV Mean Grad:      36.0 mmHg LVOT Vmax:         62.30 cm/s LVOT Vmean:        42.175 cm/s LVOT VTI:          0.124 m LVOT/AV VTI ratio: 0.15 TRICUSPID VALVE TR Peak grad:   31.4 mmHg  TR Vmax:        280.00 cm/s  SHUNTS Systemic VTI:  0.12 m Systemic Diam: 1.80 cm Eleonore Chiquito MD Electronically signed by Eleonore Chiquito MD Signature Date/Time: 10/29/2020/12:51:46 PM    Final    Structural Heart Procedure  Result Date: 11/01/2020 See surgical note for result.  Disposition   Pt is being discharged home today in good condition.  Follow-up Plans & Appointments     Follow-up Information    Eileen Stanford, PA-C. Go on 11/10/2020.   Specialties: Cardiology, Radiology Why: @ 1 pm, please arrive at least 10 minutes early.  Contact information: [address] [place] [address] [phone]                Discharge Medications   Allergies as of 11/04/2020   No Known Allergies     Medication List    TAKE these medications   acetaminophen 500 MG tablet Commonly known as: TYLENOL Take 1,000 mg by mouth in the morning, at noon, and at bedtime.   diclofenac Sodium 1 % Gel Commonly known as: VOLTAREN Apply 1 application topically 3 (three) times daily as needed (to both knees for arthritis pain).   doxycycline 100 MG tablet Commonly known as: VIBRA-TABS Take 1 tablet (100 mg total) by mouth every 12 (twelve) hours for 5 days.   dutasteride 0.5 MG capsule Commonly known as: AVODART Take 0.5 mg by mouth in the morning and at bedtime.   Ferrous Gluconate 324 (37.5 Fe) MG Tabs Take 324 mg by mouth every other day.   ipratropium-albuterol 0.5-2.5 (3) MG/3ML Soln Commonly known as: DUONEB Take 3 mLs by nebulization in the morning, at noon, and at bedtime.   levalbuterol 0.63 MG/3ML nebulizer solution Commonly known as: XOPENEX Take 0.63 mg by nebulization 2 (two) times daily.   Lidocaine 4 % Ptch Place 1 patch onto the skin in the morning and at bedtime. (0800 & 2000)   metoprolol succinate 50 MG 24 hr tablet Commonly known as: TOPROL-XL Take 50 mg by mouth daily.   montelukast 10 MG tablet Commonly known as: SINGULAIR Take 10  mg by mouth at bedtime.   polyethylene  glycol 17 g packet Commonly known as: MIRALAX / GLYCOLAX Take 17 g by mouth daily as needed for mild constipation.   polyvinyl alcohol 1.4 % ophthalmic solution Commonly known as: LIQUIFILM TEARS Place 1 drop into both eyes as needed for dry eyes.   potassium chloride 10 MEQ tablet Commonly known as: KLOR-CON Take 1 tablet (10 mEq total) by mouth daily. What changed:   medication strength  how much to take   senna-docusate 8.6-50 MG tablet Commonly known as: Senokot-S Take 2 tablets by mouth daily.   Spiriva Respimat 2.5 MCG/ACT Aers Generic drug: Tiotropium Bromide Monohydrate Inhale 2 puffs into the lungs daily.   tamsulosin 0.4 MG Caps capsule Commonly known as: FLOMAX Take 0.8 mg by mouth at bedtime.   torsemide 20 MG tablet Commonly known as: DEMADEX Take 1 tablet (20 mg total) by mouth daily. What changed: how much to take   vitamin B-12 500 MCG tablet Commonly known as: CYANOCOBALAMIN Take 500 mcg by mouth daily.   warfarin 2.5 MG tablet Commonly known as: COUMADIN Take 2.5 mg by mouth daily.   warfarin 1 MG tablet Commonly known as: COUMADIN Take 1 mg by mouth daily.       Outstanding Labs/Studies   INR, BMET  Duration of Discharge Encounter   Greater than 30 minutes including physician time.  SignedAngelena Form, PA-C 11/04/2020, 10:29 AM [phone]

## 2020-11-04 NOTE — Discharge Summary (Addendum)
Ruben [name] VALVE TEAM  Discharge Summary    Patient ID: Ruben [name] MRN: [id number]; DOB: [birth year]  Admit date: 10/28/2020 Discharge date: 11/04/2020  Primary Care Provider: Alroy [name], L.Ruben Sa, MD  Primary Cardiologist: Ruben Field, MD / Dr. Burt [name] & Dr Cyndia Bent (TAVR)  Discharge Diagnoses    Principal Problem:   Acute on chronic diastolic (congestive) heart failure ([place]) Active Problems:   Atrial fibrillation, chronic (HCC)   Severe aortic stenosis   COPD (chronic obstructive pulmonary disease) (HCC)   Mitral regurgitation   Trochanteric fracture of left femur (HCC)   BPH (benign prostatic hyperplasia)   S/P TAVR (transcatheter aortic valve replacement)   RBBB   Allergies No Known Allergies  Diagnostic Studies/Procedures    TAVR OPERATIVE NOTE   Date of Procedure:11/01/2020  Preoperative Diagnosis:Severe Aortic Stenosis   Postoperative Diagnosis:Same   Procedure:   Transcatheter Aortic Valve Replacement - Percutaneous Transfemoral Approach Edwards Sapien 3 UltraTHV (size35m, model # [id number] serial ##[id number]  Co-Surgeons:Ruben KAlveria Apley MD and Ruben Mocha MD  Anesthesiologist:Ruben GLissa Hoard MD  Echocardiographer:Ruben NJohnsie Cancel MD  Pre-operative Echo Findings: ? Severe aortic stenosis ? Normalleft ventricular systolic function ? 3+ MR  Post-operative Echo Findings: ? Noparavalvular leak ? Normal/unchangedleft ventricular systolic function  ________________________   Echo 11/02/20: IMPRESSIONS  1. Left ventricular ejection fraction, by estimation, is 55 to 60%. The  left ventricle has normal function. The left ventricle has no regional  wall motion abnormalities. Left ventricular diastolic parameters are  indeterminate.  2. Right ventricular systolic function  is mildly reduced. The right  ventricular size is mildly enlarged.  3. Left atrial size was mildly dilated.  4. Right atrial size was moderately dilated.  5. The pericardial effusion is posterior and lateral to the left  ventricle.  6. Splay artifact previous TEE suggests small flail P2 segment with  anteriorly directed MR. The mitral valve is abnormal. Moderate mitral  valve regurgitation. No evidence of mitral stenosis. Severe mitral annular  calcification.  7. Tricuspid valve regurgitation is moderate.  8. Post TAVR with 26 mm Sapien 3 valve no PVL mean gradient 8 peak 14  mmHg AVA 2.3 cm2 DVI 0.44. The aortic valve has been repaired/replaced.  Aortic valve regurgitation is not visualized. No aortic stenosis is  present. There is a 26 mm Sapien prosthetic  (TAVR) valve present in the aortic position. Procedure Date: 11/01/2020.  9. The inferior vena cava is normal in size with greater than 50%  respiratory variability, suggesting right atrial pressure of 3 mmHg.    History of Present Illness     [name] a 87y.o.malewithRBBB, severemitral regurgitation, HTN, COPDon home 02, longstanding persistent atrial fibrillation on warfarin, degenerative arthritis, severe aortic stenosiswith plans forTAVR, and recent mechanical fall with hip fracture s/p ORIFand admission for acute on chronic diastolic CHFwhowas admitted on 3/25/22with recurrent heart failure.  The patient is well known to our structural heart team. He has known severe aortic stenosis and was [place]. Upon initial consultation he was afunctionally independent and remarkably active for a gentleman forhis age.  The [facility] his son, JJuandiego [name](a [employer]. In12/2021 he was hospitalized with acute hypoxic respiratory failure felt related to a combination of COPD with acute exacerbation, rhinovirus infection, and acute on  chronic diastolic congestive heart failure. Symptoms improved with antibiotics, diuresis, and nebulized bronchodilators. Transthoracic echocardiogram performed at that time revealedEF 55%, mean grad  37 mmHg, peak grad 63.4 mmHg, AVA 0.87 cm2, DVI 0.18,as well as severe MAC with mild MS/MR.He was ultimately discharged home on home oxygen therapy.   He was referred to the multidisciplinary heart valve clinic and was evaluated by Dr. Burt [name]. Diagnostic cardiac catheterization 08/24/20 revealed diffuse calcific nonobstructive coronary artery disease with severe aortic stenosis and mild pulmonary hypertension.There was some question about splay artifact and severe MR and TEE was recommended.TEE2/2/22 confirmed severe AS andrevealed severe mitral regurgitation with a small flail segment of the middle scallop of the posterior leaflet and an eccentric jet of mitral regurgitation with flow reversal in the pulmonary veins. There was severe mitral annular calcification and functional anatomy of the valve did not appear amenable to transcatheter edge-to-edge repair.   The patient has beenextensivelyevaluated by the multidisciplinary valve team and felt to have severe, symptomatic aortic stenosis and to be a suitable candidate for TAVR, which was originallyset up for 09/20/20. On his way to the hospital for his valve surgery on 2/15, the patient suffered a mechanical fall while trying to get into the car that resulted in a hip fracture. He underwent successful ORIF by Dr. Percell [name] on 09/21/20. He was discharged to [place] home for short term rehabilitation. I saw him in the office on 10/12/20 for follow up. He was noted to be in acute heart failure with 20 lb weight gain (up to 212lbs with baseline weight of 190lbs)and making slow progress with hip rehabilitation. Outpatient diuresis was attempted but he was ultimately admitted from 3/10-3/14/22 for IV diuresis, discharge weight 201.7 lbs.Ruben Range,  PA-C saw him in the office on 3/17 after discharge and his volume status had improved on Torsemide 54m daily(previously 20 mg daily) and this was increased to 627mx 2 days to improve his volume status. He remained volume overloaded on 3/23 but weight was slowly decreasing (down to 199 lbs). He was overall doing much better with less shortness of breath and improving functional status. He was able to ambulate with his walker. He and his son were eager to proceed with TAVR and this was set up for next Tuesday 3/29. However, he developed worsening shortness of breath [facility]. In the ER, CXR showed CHF, BNP 498.5, creat 0.96, K 3.3, Hg 10.2. He was treated with Lasix 4059m1 and KDur 25m80mCardiology asked for admission.   Hospital Course     Consultants: None   Acute on chronic diastolic CHF:He was admitted 3/25with acute CHFand has done quite well with aggressive diuresis. Weight down from 195 to187 lbs (previous baseline felt to be 190 lbs). Net negative6.5L.Restarted onTorsemide 40mg72mly 3/30 with good urine output.Creat has remained normal.BP on lower side today and seems a bit more lethargic, will cut Torsemide back to 25mg 7my. Will reassess at follow-up and will need a repeat BMET at that time.   Severe AS:s/p successful TAVR with a 26 mm Edwards Sapien 3 Ultra THV via the TF approach on 11/01/20. Post operative echoshowedEF55%, normally functioning TAVR with a mean gradient of8mmHg 28m noPVL. There is moderate MR/TR and trivial pericardial effusion.Groin sites are stable. ECG with chronic afib with RBBB (old) and no high grade heart block. Back on home Coumadin. Will not add concurrent antiplatelet therapy given chronic anemia and advanced age  Chronic afib:had profound bradycardia with slow VR in initial post op period. Temp wire left in place. Now HR recovered into 90's andtemp pacer pulled. HR's became elevated  and he was  resumed on home Toprol XL with no recurrent bradycardia. Will plan to discharge back to [facility] with a [facility] AT. PCP checks INR usually but this will be checked at his follow-up appointment next week.   Moderate to severe MR: post op echo reported moderate MR.Anatomy not amendable for TEER.  COPD: continued on home inhalers.  Left heel ulcer: this is chronic and has been bandaged. Pt complains of significant pain here. He has been started on oral doxycycline 159m BID x10 days. He will need to complete 5 more days at discharge.  Hip fracture s/p ORIF:this has significantly set him back.[facility] Seen by Dr. MPercell Millerwhile admitted. Hip healing well. Continued PT recommended. [facility]    ADDENDUM: [facility] but felt more lethargic. Evaluated on 11/05/2020 and reported significant improvement in his symptoms and felt back to baseline. [facility]   _____________  Discharge Vitals Blood pressure (!) 98/53, pulse 88, temperature 98.1 F (36.7 C), temperature source Oral, resp. rate 18, height _0  (1.702 m), weight 76.6 kg, SpO2 96 %.       Filed Weights   11/02/20 0500 11/03/20 0600 11/04/20 0300  Weight: 86.1 kg 84.7 kg 76.6 kg    GEN: Well nourished, well developed, in no acute distress HEENT: normal Neck: no JVD or masses Cardiac: irreg irreg; 3/6 HSM at apex. No rubs, or gallops,no edema  Respiratory:  clear to auscultation bilaterally, normal work of breathing GI: soft, nontender, nondistended, + BS MS: no deformity or atrophy Skin: warm and dry, no rash.  Groin sites clear without hematoma or ecchymosis  Neuro:  Alert and Oriented x 3, Strength and sensation are intact Psych: euthymic mood, full affect    Labs & Radiologic Studies    CBC Recent Labs (last 2 labs)       Recent Labs     11/03/20 0118 11/04/20 0149  WBC 6.5 6.1  HGB 9.1* 9.3*  HCT 28.7* 29.7*  MCV 99.0 101.7*  PLT 150 154     Basic Metabolic Panel Recent Labs (last 2 labs)         Recent Labs    11/01/20 1755 11/02/20 0429 11/03/20 0118 11/04/20 0149  NA 138 135 136 137  K 3.8 3.9 3.5 3.6  CL 95* 93* 93* 92*  CO2 37* 36* 38* 41*  GLUCOSE 79 92 109* 116*  BUN _1 26*  CREATININE 0.79 0.75 0.85 0.80  CALCIUM 8.5* 8.6* 8.3* 8.5*  MG 1.7 1.6*  --   --      Liver Function Tests Recent Labs (last 2 labs)   No results for input(s): AST, ALT, ALKPHOS, BILITOT, PROT, ALBUMIN in the last 72 hours.   Recent Labs (last 2 labs)   No results for input(s): LIPASE, AMYLASE in the last 72 hours.   Cardiac Enzymes Recent Labs (last 2 labs)   No results for input(s): CKTOTAL, CKMB, CKMBINDEX, TROPONINI in the last 72 hours.   BNP Recent Labs (last 2 labs)   Invalid input(s): POCBNP   D-Dimer Recent Labs (last 2 labs)   No results for input(s): DDIMER in the last 72 hours.   Hemoglobin A1C Recent Labs (last 2 labs)   No results for input(s): HGBA1C in the last 72 hours.   Fasting Lipid Panel  Recent Labs (last 2 labs)   No results for input(s): CHOL, HDL, LDLCALC, TRIG, CHOLHDL, LDLDIRECT in the last 72 hours.   Thyroid Function Tests  Recent Labs (last 2 labs)   No results for input(s): TSH, T4TOTAL, T3FREE, THYROIDAB in the last 72 hours.  Invalid input(s): FREET3   _____________   Imaging Results  DG Chest 2 View  Result Date: 10/31/2020 CLINICAL DATA:  Shortness of breath, severe aortic stenosis EXAM: CHEST - 2 VIEW COMPARISON:  Portable exam of 10/28/2020 FINDINGS: Enlargement of cardiac silhouette with pulmonary vascular congestion. Atherosclerotic calcification aorta. Scattered pulmonary infiltrates likely representing pulmonary edema. Small bibasilar pleural effusions and adjacent basilar atelectasis. No pneumothorax. IMPRESSION: Enlargement of cardiac silhouette  with pulmonary vascular congestion and BILATERAL pulmonary infiltrates consistent with pulmonary edema. Bibasilar pleural effusions and atelectasis. Aortic Atherosclerosis (ICD10-I70.0). Electronically Signed   By: Lavonia Dana M.D.   On: 10/31/2020 08:29   DG Chest Port 1 View  Result Date: 10/28/2020 CLINICAL DATA:  Shortness of breath EXAM: PORTABLE CHEST 1 VIEW COMPARISON:  Fifteen days ago FINDINGS: Cardiomegaly and vascular pedicle widening with congested vessels and bilateral pleural effusion. There is diffuse interstitial opacity and streaky/hazy density at the bases. Dense calcification of the mitral annulus. No pneumothorax. IMPRESSION: CHF pattern. Electronically Signed   By: Monte Fantasia M.D.   On: 10/28/2020 06:53   DG Chest Portable 1 View  Result Date: 10/13/2020 CLINICAL DATA:  Shortness of breath and lower extremity edema. EXAM: PORTABLE CHEST 1 VIEW COMPARISON:  October 13, 2020 (5:17 p.m.) FINDINGS: Stable moderate to marked severity diffusely increased interstitial lung markings are noted. A small left pleural effusion is suspected. No pneumothorax is identified. The cardiac silhouette is markedly enlarged. Marked severity calcification of the aortic arch is seen. Degenerative changes are noted throughout the thoracic spine. IMPRESSION: Cardiomegaly with pulmonary edema, stable in severity when compared to the prior study. Electronically Signed   By: Virgina Norfolk M.D.   On: 10/13/2020 22:51   DG Chest Port 1 View  Result Date: 10/13/2020 CLINICAL DATA:  Shortness of breath.  Lower extremity edema. EXAM: PORTABLE CHEST 1 VIEW COMPARISON:  09/16/2020 FINDINGS: Chronic cardiomegaly. Unchanged mediastinal contours with aortic atherosclerosis. Interstitial thickening and Kerley B-lines consistent with pulmonary edema. Small bilateral pleural effusions are similar to prior exam. No pneumothorax. No evidence of pneumonia. IMPRESSION: 1. Pulmonary edema is new from last month. 2.  Cardiomegaly and bilateral pleural effusions appear similar. Electronically Signed   By: Keith Rake M.D.   On: 10/13/2020 17:58   DG HIP PORT UNILAT WITH PELVIS 1V LEFT  Result Date: 11/03/2020 CLINICAL DATA:  Prior ORIF. EXAM: DG HIP (WITH OR WITHOUT PELVIS) 1V PORT LEFT COMPARISON:  09/20/2020. FINDINGS: Prior ORIF left hip. Hardware intact. Anatomic alignment. Left femoral neck fracture lucency again identified. Fracture fragments adjacent to the left hip again noted. Degenerative change left hip again noted. Probable bone island left acetabulum again noted. No new abnormality. Pelvic calcifications consistent phleboliths. Peripheral vascular calcification. IMPRESSION: Prior ORIF left hip. Anatomic alignment. Exam appears stable from prior exam. Electronically Signed   By: Marcello Moores  Register   On: 11/03/2020 11:58   ECHOCARDIOGRAM LIMITED  Result Date: 11/02/2020    ECHOCARDIOGRAM REPORT   Patient Name:   OTHELLO DICKENSON Date of Exam: 11/02/2020 Medical Rec #:  [id number]    Height:       67.0 in Accession #:    [id number]   Weight:       189.8  lb Date of Birth:  06-24-1927     BSA:          1.978 m Patient Age:    27 years     BP:           120/50 mmHg Patient Gender: M            HR:           80 bpm. Exam Location:  Inpatient Procedure: Limited Echo, Limited Color Doppler and Cardiac Doppler Indications:    Post TAVR Evaluation Z95.2  History:        Patient has prior history of Echocardiogram examinations, most                 recent 11/01/2020. CHF; COPD.                 Aortic Valve: 26 mm Sapien prosthetic, stented (TAVR) valve is                 present in the aortic position. Procedure Date: 11/01/2020.  Sonographer:    Mikki Santee RDCS (AE) Referring Phys: [id number] [place]  1. Left ventricular ejection fraction, by estimation, is 55 to 60%. The left ventricle has normal function. The left ventricle has no regional wall motion abnormalities. Left ventricular diastolic  parameters are indeterminate.  2. Right ventricular systolic function is mildly reduced. The right ventricular size is mildly enlarged.  3. Left atrial size was mildly dilated.  4. Right atrial size was moderately dilated.  5. The pericardial effusion is posterior and lateral to the left ventricle.  6. Splay artifact previous TEE suggests small flail P2 segment with anteriorly directed MR. The mitral valve is abnormal. Moderate mitral valve regurgitation. No evidence of mitral stenosis. Severe mitral annular calcification.  7. Tricuspid valve regurgitation is moderate.  8. Post TAVR with 26 mm Sapien 3 valve no PVL mean gradient 8 peak 14 mmHg AVA 2.3 cm2 DVI 0.44. The aortic valve has been repaired/replaced. Aortic valve regurgitation is not visualized. No aortic stenosis is present. There is a 26 mm Sapien prosthetic  (TAVR) valve present in the aortic position. Procedure Date: 11/01/2020.  9. The inferior vena cava is normal in size with greater than 50% respiratory variability, suggesting right atrial pressure of 3 mmHg. FINDINGS  Left Ventricle: Left ventricular ejection fraction, by estimation, is 55 to 60%. The left ventricle has normal function. The left ventricle has no regional wall motion abnormalities. The left ventricular internal cavity size was normal in size. There is  no left ventricular hypertrophy. Left ventricular diastolic parameters are indeterminate. Right Ventricle: The right ventricular size is mildly enlarged. Right vetricular wall thickness was not assessed. Right ventricular systolic function is mildly reduced. Left Atrium: Left atrial size was mildly dilated. Right Atrium: Right atrial size was moderately dilated. Pericardium: Trivial pericardial effusion is present. The pericardial effusion is posterior and lateral to the left ventricle. Mitral Valve: Splay artifact previous TEE suggests small flail P2 segment with anteriorly directed MR. The mitral valve is abnormal. There is severe  thickening of the mitral valve leaflet(s). There is severe calcification of the mitral valve leaflet(s). Severe mitral annular calcification. Moderate mitral valve regurgitation. No evidence of mitral valve stenosis. MV peak gradient, 13.2 mmHg. The mean mitral valve gradient is 4.5 mmHg. Tricuspid Valve: The tricuspid valve is normal in structure. Tricuspid valve regurgitation is moderate . No evidence of tricuspid stenosis. Aortic Valve: Post TAVR with 26 mm Sapien 3  valve no PVL mean gradient 8 peak 14 mmHg AVA 2.3 cm2 DVI 0.44. The aortic valve has been repaired/replaced. Aortic valve regurgitation is not visualized. No aortic stenosis is present. Aortic valve mean gradient measures 7.5 mmHg. Aortic valve peak gradient measures 13.7 mmHg. Aortic valve area, by VTI measures 2.34 cm. There is a 26 mm Sapien prosthetic, stented (TAVR) valve present in the aortic position. Procedure Date: 11/01/2020. Pulmonic Valve: The pulmonic valve was normal in structure. Pulmonic valve regurgitation is not visualized. No evidence of pulmonic stenosis. Aorta: The aortic root is normal in size and structure. Venous: The inferior vena cava is normal in size with greater than 50% respiratory variability, suggesting right atrial pressure of 3 mmHg. IAS/Shunts: The interatrial septum was not well visualized.  LEFT VENTRICLE PLAX 2D LVOT diam:     2.60 cm LV SV:         80 LV SV Index:   40 LVOT Area:     5.31 cm  AORTIC VALVE AV Area (Vmax):    2.32 cm AV Area (Vmean):   2.28 cm AV Area (VTI):     2.34 cm AV Vmax:           184.75 cm/s AV Vmean:          130.250 cm/s AV VTI:            0.342 m AV Peak Grad:      13.7 mmHg AV Mean Grad:      7.5 mmHg LVOT Vmax:         80.70 cm/s LVOT Vmean:        55.900 cm/s LVOT VTI:          0.151 m LVOT/AV VTI ratio: 0.44 MITRAL VALVE             TRICUSPID VALVE MV Area (PHT): 2.22 cm  TR Peak grad:   38.2 mmHg MV Area VTI:   1.80 cm  TR Vmax:        309.00 cm/s MV Peak grad:  13.2 mmHg MV  Mean grad:  4.5 mmHg  SHUNTS MV Vmax:       1.82 m/s  Systemic VTI:  0.15 m MV Vmean:      96.4 cm/s Systemic Diam: 2.60 cm Jenkins Rouge MD Electronically signed by Jenkins Rouge MD Signature Date/Time: 11/02/2020/11:08:31 AM    Final    ECHOCARDIOGRAM LIMITED  Result Date: 11/01/2020    ECHOCARDIOGRAM LIMITED REPORT   Patient Name:   URHO RIO Date of Exam: 11/01/2020 Medical Rec #:  [id number]    Height:       66.0 in Accession #:    [id number]   Weight:       189.4 lb Date of Birth:  July 17, 1927     BSA:          1.954 m Patient Age:    55 years     BP:           114/58 mmHg Patient Gender: M            HR:           82 bpm. Exam Location:  Inpatient Procedure: Limited Echo, Echo Assisted Procedure, Limited Color Doppler and            Cardiac Doppler Indications:     Aortic Stenosis; TAVR Procedure  History:         Patient has prior history of Echocardiogram examinations, most  recent 10/29/2020. CHF; COPD.                  Aortic Valve: 26 mm Edwards Sapien prosthetic, stented (TAVR)                  valve is present in the aortic position. Procedure Date:                  11/01/2020.  Sonographer:     Mikki Santee RDCS (AE) Referring Phys:  [id number] Eileen Stanford Diagnosing Phys: Jenkins Rouge MD IMPRESSIONS  1. Left ventricular ejection fraction, by estimation, is 60 to 65%. The left ventricle has normal function. There is moderate left ventricular hypertrophy.  2. Left atrial size was severely dilated.  3. Right atrial size was moderately dilated.  4. A small pericardial effusion is present. The pericardial effusion is posterior and lateral to the left ventricle.  5. Known small flail P2 segemnt by TEE with "splay" artifact and large anteriorly directed jet no fully characterized on this TAVR limited study . The mitral valve is degenerative. Moderate to severe mitral valve regurgitation. Severe mitral annular calcification.  6. Tricuspid valve regurgitation is moderate.  7. Pre  TAVR: tri leaflet severely calcified peak velocity 3.8 m/sec mean gradient 37 mmHg peak 59 mmHg AVA 0.45 cm2         Post TAVR: well positioned 26 mm Sapien 3 valve no PVL peak gradient 8 mean 4 mmHg with AVA 2.2 cm2. The aortic valve has been repaired/replaced. Aortic valve regurgitation is mild. Severe aortic valve stenosis. There is a 26 mm Edwards Sapien prosthetic (TAVR) valve present in the aortic position. Procedure Date: 11/01/2020. FINDINGS  Left Ventricle: Left ventricular ejection fraction, by estimation, is 60 to 65%. The left ventricle has normal function. The left ventricular internal cavity size was small. There is moderate left ventricular hypertrophy. Left Atrium: Left atrial size was severely dilated. Right Atrium: Right atrial size was moderately dilated. Pericardium: A small pericardial effusion is present. The pericardial effusion is posterior and lateral to the left ventricle. Mitral Valve: Known small flail P2 segemnt by TEE with "splay" artifact and large anteriorly directed jet no fully characterized on this TAVR limited study. The mitral valve is degenerative in appearance. There is severe thickening of the mitral valve leaflet(s). There is severe calcification of the mitral valve leaflet(s). Severe mitral annular calcification. Moderate to severe mitral valve regurgitation, with anteriorly-directed jet. Tricuspid Valve: Tricuspid valve regurgitation is moderate. Aortic Valve: Pre TAVR: tri leaflet severely calcified peak velocity 3.8 m/sec mean gradient 37 mmHg peak 59 mmHg AVA 0.45 cm2 Post TAVR: well positioned 26 mm Sapien 3 valve no PVL peak gradient 8 mean 4 mmHg with AVA 2.2 cm2. The aortic valve has been repaired/replaced. Aortic valve regurgitation is mild. Severe aortic stenosis is present. Aortic valve mean gradient measures 4.0 mmHg. Aortic valve peak gradient measures 7.6 mmHg. Aortic valve area, by VTI measures 2.17 cm. There is a 26 mm Edwards Sapien prosthetic, stented  (TAVR) valve present in the aortic position. Procedure Date: 11/01/2020. LEFT VENTRICLE PLAX 2D LVOT diam:     2.10 cm LV SV:         55 LV SV Index:   28 LVOT Area:     3.46 cm  AORTIC VALVE AV Area (Vmax):    1.53 cm AV Area (Vmean):   1.69 cm AV Area (VTI):     2.17 cm AV Vmax:  138.00 cm/s AV Vmean:          88.300 cm/s AV VTI:            0.254 m AV Peak Grad:      7.6 mmHg AV Mean Grad:      4.0 mmHg LVOT Vmax:         61.10 cm/s LVOT Vmean:        43.100 cm/s LVOT VTI:          0.159 m LVOT/AV VTI ratio: 0.63  SHUNTS Systemic VTI:  0.16 m Systemic Diam: 2.10 cm Jenkins Rouge MD Electronically signed by Jenkins Rouge MD Signature Date/Time: 11/01/2020/12:48:42 PM    Final    ECHOCARDIOGRAM LIMITED  Result Date: 10/29/2020    ECHOCARDIOGRAM LIMITED REPORT   Patient Name:   DIGBY GROENEVELD Date of Exam: 10/29/2020 Medical Rec #:  [id number]    Height:       67.0 in Accession #:    [id number]   Weight:       194.9 lb Date of Birth:  Feb 20, 1927     BSA:          2.000 m Patient Age:    55 years     BP:           124/83 mmHg Patient Gender: M            HR:           89 bpm. Exam Location:  Inpatient Procedure: Limited Echo, Cardiac Doppler and Color Doppler Indications:    acute diastolic chf  History:        Patient has prior history of Echocardiogram examinations, most                 recent 09/07/2020. COPD, Aortic Valve Disease; Arrythmias:Atrial                 Fibrillation.  Sonographer:    Johny Chess Referring Phys: [id number] [place]  1. Left ventricular ejection fraction, by estimation, is 60 to 65%. The left ventricle has normal function. There is moderate concentric left ventricular hypertrophy. There is the interventricular septum is flattened in systole and diastole, consistent with right ventricular pressure and volume overload.  2. Right ventricular systolic function is moderately reduced. The right ventricular size is severely enlarged.  3. Moderate to severe  mitral regurgitation is present. There is splay artifact. Refer to recent TEE for characterization. The mitral valve is degenerative. Moderate to severe mitral valve regurgitation. Severe mitral annular calcification.  4. The aortic valve is tricuspid. There is severe calcifcation of the aortic valve. There is severe thickening of the aortic valve. Severe aortic valve stenosis. Aortic valve area, by VTI measures 0.37 cm. Aortic valve mean gradient measures 36.0 mmHg.  Aortic valve Vmax measures 3.91 m/s. Comparison(s): No significant change from prior study. FINDINGS  Left Ventricle: Left ventricular ejection fraction, by estimation, is 60 to 65%. The left ventricle has normal function. The left ventricular internal cavity size was small. There is moderate concentric left ventricular hypertrophy. The interventricular  septum is flattened in systole and diastole, consistent with right ventricular pressure and volume overload. Right Ventricle: The right ventricular size is severely enlarged. No increase in right ventricular wall thickness. Right ventricular systolic function is moderately reduced. Pericardium: Trivial pericardial effusion is present. Mitral Valve: Moderate to severe mitral regurgitation is present. There is splay artifact. Refer to recent TEE for characterization. The mitral valve is degenerative in  appearance. Severe mitral annular calcification. Moderate to severe mitral valve regurgitation. Tricuspid Valve: The tricuspid valve is grossly normal. Tricuspid valve regurgitation is mild . No evidence of tricuspid stenosis. Aortic Valve: The aortic valve is tricuspid. There is severe calcifcation of the aortic valve. There is severe thickening of the aortic valve. Severe aortic stenosis is present. Aortic valve mean gradient measures 36.0 mmHg. Aortic valve peak gradient measures 61.3 mmHg. Aortic valve area, by VTI measures 0.37 cm. Pulmonic Valve: The pulmonic valve was grossly normal. Pulmonic  valve regurgitation is mild. No evidence of pulmonic stenosis. LEFT VENTRICLE PLAX 2D LVIDd:         4.70 cm LVIDs:         2.80 cm LV PW:         1.40 cm LV IVS:        1.30 cm LVOT diam:     1.80 cm LV SV:         32 LV SV Index:   16 LVOT Area:     2.54 cm  AORTIC VALVE AV Area (Vmax):    0.41 cm AV Area (Vmean):   0.39 cm AV Area (VTI):     0.37 cm AV Vmax:           391.33 cm/s AV Vmean:          278.333 cm/s AV VTI:            0.856 m AV Peak Grad:      61.3 mmHg AV Mean Grad:      36.0 mmHg LVOT Vmax:         62.30 cm/s LVOT Vmean:        42.175 cm/s LVOT VTI:          0.124 m LVOT/AV VTI ratio: 0.15 TRICUSPID VALVE TR Peak grad:   31.4 mmHg TR Vmax:        280.00 cm/s  SHUNTS Systemic VTI:  0.12 m Systemic Diam: 1.80 cm Eleonore Chiquito MD Electronically signed by Eleonore Chiquito MD Signature Date/Time: 10/29/2020/12:51:46 PM    Final    Structural Heart Procedure  Result Date: 11/01/2020 See surgical note for result.   Disposition   Pt is being discharged back to [facility] today in good condition.  Follow-up Plans & Appointments         Follow-up Information        Eileen Stanford, PA-C. Go on 11/10/2020.   Specialties: Cardiology, Radiology Why: @ 1 pm, please arrive at least 10 minutes early.  Contact information: [address] [place] [address] [phone]                Discharge Medications   Allergies as of 11/04/2020   No Known Allergies        Medication List    TAKE these medications   acetaminophen 500 MG tablet Commonly known as: TYLENOL Take 1,000 mg by mouth in the morning, at noon, and at bedtime.   diclofenac Sodium 1 % Gel Commonly known as: VOLTAREN Apply 1 application topically 3 (three) times daily as needed (to both knees for arthritis pain).   doxycycline 100 MG tablet Commonly known as: VIBRA-TABS Take 1 tablet (100 mg total) by mouth every 12 (twelve) hours for 5 days.   dutasteride 0.5  MG capsule Commonly known as: AVODART Take 0.5 mg by mouth in the morning and at bedtime.   Ferrous Gluconate 324 (37.5 Fe) MG Tabs Take 324 mg  by mouth every other day.   ipratropium-albuterol 0.5-2.5 (3) MG/3ML Soln Commonly known as: DUONEB Take 3 mLs by nebulization in the morning, at noon, and at bedtime.   levalbuterol 0.63 MG/3ML nebulizer solution Commonly known as: XOPENEX Take 0.63 mg by nebulization 2 (two) times daily.   Lidocaine 4 % Ptch Place 1 patch onto the skin in the morning and at bedtime. (0800 & 2000)   metoprolol succinate 50 MG 24 hr tablet Commonly known as: TOPROL-XL Take 50 mg by mouth daily.   montelukast 10 MG tablet Commonly known as: SINGULAIR Take 10 mg by mouth at bedtime.   polyethylene glycol 17 g packet Commonly known as: MIRALAX / GLYCOLAX Take 17 g by mouth daily as needed for mild constipation.   polyvinyl alcohol 1.4 % ophthalmic solution Commonly known as: LIQUIFILM TEARS Place 1 drop into both eyes as needed for dry eyes.   potassium chloride 10 MEQ tablet Commonly known as: KLOR-CON Take 1 tablet (10 mEq total) by mouth daily. What changed:   medication strength  how much to take   senna-docusate 8.6-50 MG tablet Commonly known as: Senokot-S Take 2 tablets by mouth daily.   Spiriva Respimat 2.5 MCG/ACT Aers Generic drug: Tiotropium Bromide Monohydrate Inhale 2 puffs into the lungs daily.   tamsulosin 0.4 MG Caps capsule Commonly known as: FLOMAX Take 0.8 mg by mouth at bedtime.   torsemide 20 MG tablet Commonly known as: DEMADEX Take 1 tablet (20 mg total) by mouth daily. What changed: how much to take   vitamin B-12 500 MCG tablet Commonly known as: CYANOCOBALAMIN Take 500 mcg by mouth daily.   warfarin 2.5 MG tablet Commonly known as: COUMADIN Take 2.5 mg by mouth daily.   warfarin 1 MG tablet Commonly known as: COUMADIN Take 1 mg by mouth daily.       Outstanding Labs/Studies    INR, BMET  Duration of Discharge Encounter   Greater than 30 minutes including physician time.  Mable Fill, PA-C 11/04/2020, 10:29 AM [phone]  Patient seen, please see progress note from today. Stable for discharge.   Elouise Munroe, MD 11/05/20 9:30 AM

## 2020-11-04 NOTE — Progress Notes (Signed)
Zio patch placed onto patient.  All instructions and information reviewed with patient, they verbalize understanding with no questions. 

## 2020-11-04 NOTE — Telephone Encounter (Signed)
    Ruben [name] with [facility] calling to report abnormal ekg result

## 2020-11-04 NOTE — TOC Progression Note (Signed)
Transition of Care [facility]) - Progression Note    Patient Details  Name: Ruben [name] MRN: [id number] Date of Birth: 27-Nov-1926  [facility]) CM/SW Contact  Eduard Roux, [place] Phone Number: 11/04/2020, 2:42 PM  Clinical Narrative:     Per RN- PA discharged cancelled, patient will be d/c tomorrow to [facility]  [place] Placed -updated   Antony Blackbird, MSW, LCSW Clinical Social Worker   Expected Discharge Plan: Skilled Nursing Facility Barriers to Discharge: Barriers Resolved  Expected Discharge Plan and Services Expected Discharge Plan: Skilled Nursing Facility In-house Referral: Clinical Social Work     Living arrangements for the past 2 months: Skilled Nursing Facility Expected Discharge Date: 11/04/20                                     Social Determinants of Health (SDOH) Interventions    Readmission Risk Interventions No flowsheet data found.

## 2020-11-04 NOTE — Progress Notes (Signed)
Patient requesting Mucinex. Carlean Jews [facility] made aware. Orders received. Will monitor patient. Keyri Salberg, Randall An RN

## 2020-11-04 NOTE — Telephone Encounter (Signed)
Spoke with Zollie Scale at [facility] who reports that patient is in atrial fibrillation with heart rate of 76 bpm. She is faxing over the report.  Patient has chronic atrial fibrillation.

## 2020-11-04 NOTE — Progress Notes (Addendum)
Mobility Specialist: Progress Note   11/04/20 1628  Mobility  Activity  (LE Exercises)  Level of Assistance Independent  Assistive Device None  Mobility Response Tolerated well  Mobility performed by Mobility specialist  Bed Position Chair  $Mobility charge 1 Mobility   Post-Mobility: 87 HR, 95% SpO2  Pt refused mobility due to pain in LLE in his hip/thigh. Pt agreeable to perform LE strengthening exercises though. Pt performed 3x10 leg extensions while sitting in chair on 3 L/min [place]. Pt c/o 3/10 pain before mobility but said it got up to 5/10 by the end of the session. Pt requesting Mucinex, RN notified.   [facility] Eleanore Junio Mobility Specialist Mobility Specialist Phone: [phone]

## 2020-11-04 NOTE — Progress Notes (Signed)
Physical Therapy Treatment Patient Details Name: Ruben [name] MRN: [id number] DOB: 12-14-1926 Today's Date: 11/04/2020    History of Present Illness 87 y.o. male admitted on 10/28/20 with recurrent heart failure.  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  PMHx severe mitral regurgitation, HTN, COPD on home 02, longstanding persistent atrial fibrillation on warfarin, degenerative arthritis, severe aortic stenosis  and recent mechanical fall with hip fracture s/p ORIF.  Plans on discharge to son's home with spouse.    PT Comments    Pt received in recliner. He performed LE exercises. Min assist sit to stand and ambulation 20' with RW. Pt on 3L continuous O2 during session. Continued c/o L hip/thigh pain limiting standing/gait. Pt remained in recliner at end of session. Discharge recommendation updated to SNF at d/c for continued mobility progression and strengthening prior to d/c home.    Follow Up Recommendations  SNF;Supervision/Assistance - 24 hour     Equipment Recommendations  None recommended by PT    Recommendations for Other Services       Precautions / Restrictions Precautions Precautions: Fall    Mobility  Bed Mobility               General bed mobility comments: up in the recliner on arrival    Transfers Overall transfer level: Needs assistance Equipment used: Rolling walker (2 wheeled) Transfers: Sit to/from Stand Sit to Stand: Min assist         General transfer comment: assist to power up and stabilize balance  Ambulation/Gait Ambulation/Gait assistance: Min assist Gait Distance (Feet): 20 Feet Assistive device: Rolling walker (2 wheeled) Gait Pattern/deviations: Step-to pattern;Step-through pattern;Antalgic Gait velocity: decreased Gait velocity interpretation: <1.31 ft/sec, indicative of household ambulator General Gait Details: assist to stabilize balance, distance limited by pain and fatigue   Stairs             Wheelchair Mobility    Modified Rankin  (Stroke Patients Only)       Balance Overall balance assessment: Needs assistance Sitting-balance support: No upper extremity supported;Feet supported Sitting balance-Leahy Scale: Fair     Standing balance support: Bilateral upper extremity supported;During functional activity Standing balance-Leahy Scale: Poor Standing balance comment: reliant on UE support and external assist                            Cognition Arousal/Alertness: Awake/alert Behavior During Therapy: WFL for tasks assessed/performed Overall Cognitive Status: Within Functional Limits for tasks assessed                                 General Comments: [facility]      Exercises General Exercises - Lower Extremity Ankle Circles/Pumps: Both;10 reps;AROM Heel Slides: AROM;Left;10 reps Hip ABduction/ADduction: AROM;Left;10 reps Hip Flexion/Marching: AROM;Right;Left;5 reps;Standing (with RW)    General Comments General comments (skin integrity, edema, etc.): Pt on 3L continuous O2. SpO2 97% at rest. Desat to upper 80s during activity. Resting HR 60s, increased to 80s-90s during activity.      Pertinent Vitals/Pain Pain Assessment: Faces Faces Pain Scale: Hurts little more Pain Location: L hip with weight bearing Pain Descriptors / Indicators: Grimacing;Guarding;Sore Pain Intervention(s): Limited activity within patient's tolerance;Monitored during session    Home Living                      Prior Function  PT Goals (current goals can now be found in the care plan section) Acute Rehab PT Goals Patient Stated Goal: get stronger and go home PT Goal Formulation: With patient Time For Goal Achievement: 11/17/20 Potential to Achieve Goals: Good Progress towards PT goals: Progressing toward goals (slowly)    Frequency    Min 3X/week      PT Plan Discharge plan needs to be updated    Co-evaluation              AM-PAC PT "6 Clicks" Mobility   Outcome  Measure  Help needed turning from your back to your side while in a flat bed without using bedrails?: A Little Help needed moving from lying on your back to sitting on the side of a flat bed without using bedrails?: A Little Help needed moving to and from a bed to a chair (including a wheelchair)?: A Little Help needed standing up from a chair using your arms (e.g., wheelchair or bedside chair)?: A Little Help needed to walk in hospital room?: A Little Help needed climbing 3-5 steps with a railing? : A Lot 6 Click Score: 17    End of Session Equipment Utilized During Treatment: Oxygen;Gait belt Activity Tolerance: Patient tolerated treatment well Patient left: in chair;with call bell/phone within reach;with family/visitor present Nurse Communication: Mobility status PT Visit Diagnosis: Unsteadiness on feet (R26.81);Muscle weakness (generalized) (M62.81);History of falling (Z91.81);Pain;Other abnormalities of gait and mobility (R26.89) Pain - Right/Left: Left Pain - part of body: Leg     Time: 2683-4196 PT Time Calculation (min) (ACUTE ONLY): 23 min  Charges:  $Gait Training: 8-22 mins $Therapeutic Exercise: 8-22 mins                     Aida Raider, PT  Office # [phone] Pager [phone]    Ilda Foil 11/04/2020, 10:42 AM

## 2020-11-04 NOTE — Progress Notes (Addendum)
Patient with SBP 98-102 this AM [name].  Orders to hold today's dose of Metoprolol. Will monitor patient. Silvia Markuson, Randall An RN

## 2020-11-04 NOTE — Care Management Important Message (Signed)
Important Message  Patient Details  Name: Ruben [name] MRN: [id number] Date of Birth: 08/27/1926   Medicare Important Message Given:  Yes     Renie Ora 11/04/2020, 10:20 AM

## 2020-11-04 NOTE — Progress Notes (Signed)
Spoke with Cline Crock [facility], patient will not be discharging this afternoon, plant to discharge Tomorrow 11/05/2020. Social worker updated, see progress note. Will continue to monitor patient. Aldair Rickel, Randall An RN

## 2020-11-04 NOTE — Progress Notes (Signed)
Occupational Therapy Treatment Patient Details Name: Ruben [name] MRN: [id number] DOB: 10-17-1926 Today's Date: 11/04/2020    History of present illness 87 y.o. male admitted on 10/28/20 with recurrent heart failure.  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  PMHx severe mitral regurgitation, HTN, COPD on home 02, longstanding persistent atrial fibrillation on warfarin, degenerative arthritis, severe aortic stenosis  and recent mechanical fall with hip fracture s/p ORIF.   OT comments  Patient limited this date due to continued and worsening L hip discomfort with weight bearing.  Per xray report: IMPRESSION:Prior ORIF left hip. Anatomic alignment. Exam appears stable from prior exam.  Patient is limited with standing tolerance, and would benefit from ongoing post acute rehab to ensure a safe transition home.  It was initially thought he could transition home post acute, but given ongoing hip pain and a decline to his functional status, SNF is recommended.  OT will continue to follow in the acute setting.    Follow Up Recommendations  SNF    Equipment Recommendations  None recommended by OT    Recommendations for Other Services      Precautions / Restrictions Precautions Precautions: Fall Precaution Comments: Tachy with mobility Restrictions Weight Bearing Restrictions: No LLE Weight Bearing: Weight bearing as tolerated Other Position/Activity Restrictions: xray completed with no new findings       Mobility Bed Mobility               General bed mobility comments: up in the recliner on arrival    Transfers Overall transfer level: Needs assistance Equipment used: Rolling walker (2 wheeled) Transfers: Sit to/from [employer] Sit to Stand: Min assist Stand pivot transfers: Min assist       General transfer comment: assist to power up and stabilize balance    Balance Overall balance assessment: Needs assistance Sitting-balance support: No upper extremity supported;Feet  supported Sitting balance-Leahy Scale: Fair     Standing balance support: Bilateral upper extremity supported Standing balance-Leahy Scale: Poor Standing balance comment: reliant on UE support and external assist                           ADL either performed or assessed with clinical judgement   ADL       Grooming: Wash/dry hands;Wash/dry face;Set up;Sitting               Lower Body Dressing: Moderate assistance;Sit to/from stand               Functional mobility during ADLs: Minimal assistance;Rolling walker       Vision       Perception     Praxis      Cognition Arousal/Alertness: Awake/alert Behavior During Therapy: WFL for tasks assessed/performed Overall Cognitive Status: Within Functional Limits for tasks assessed                                 General Comments: [facility]        Exercises General Exercises - Lower Extremity Ankle Circles/Pumps: Both;10 reps;AROM Heel Slides: AROM;Left;10 reps Hip ABduction/ADduction: AROM;Left;10 reps Hip Flexion/Marching: AROM;Right;Left;5 reps;Standing (with RW)   Shoulder Instructions       General Comments Pt on 3L continuous O2. SpO2 97% at rest. Desat to upper 80s during activity. Resting HR 60s, increased to 80s-90s during activity.    Pertinent Vitals/ Pain       Pain Assessment: Faces Faces  Pain Scale: Hurts even more Pain Location: L hip with weight bearing Pain Descriptors / Indicators: Grimacing;Guarding;Sore Pain Intervention(s): Monitored during session                                                          Frequency  Min 2X/week        Progress Toward Goals  OT Goals(current goals can now be found in the care plan section)  Progress towards OT goals: Progressing toward goals  Acute Rehab OT Goals Patient Stated Goal: I wish this leg didn't hurt so much OT Goal Formulation: With patient Time For Goal Achievement:  11/17/20 Potential to Achieve Goals: Good  Plan Discharge plan needs to be updated    Co-evaluation                 AM-PAC OT "6 Clicks" Daily Activity     Outcome Measure   Help from another person eating meals?: None Help from another person taking care of personal grooming?: None Help from another person toileting, which includes using toliet, bedpan, or urinal?: A Lot Help from another person bathing (including washing, rinsing, drying)?: A Lot Help from another person to put on and taking off regular upper body clothing?: A Little Help from another person to put on and taking off regular lower body clothing?: A Lot 6 Click Score: 17    End of Session Equipment Utilized During Treatment: Rolling walker;Oxygen  OT Visit Diagnosis: Unsteadiness on feet (R26.81);Pain Pain - Right/Left: Left Pain - part of body: Hip   Activity Tolerance Patient limited by pain   Patient Left in chair;with call bell/phone within reach   Nurse Communication Mobility status        Time: 1962-2297 OT Time Calculation (min): 19 min  Charges: OT General Charges $OT Visit: 1 Visit OT Treatments $Self Care/Home Management : 8-22 mins  11/04/2020  Rich, OTR/L  Acute Rehabilitation Services  Office:  [phone]    Suzanna Obey 11/04/2020, 1:56 PM

## 2020-11-04 NOTE — [place] FL2 (Signed)
[place] MEDICAID FL2 LEVEL OF CARE SCREENING TOOL     IDENTIFICATION  Patient Name: Ruben [name] Birthdate: [birth year] Sex: male Admission Date (Current Location): 10/28/2020  [facility] and [place] Number:  Producer, television/film/video and Address:  [facility], [address] [place], [place] [address]      Provider Number: [id number]  Attending Physician Name and Address:  Tonny Bollman, MD  Relative Name and Phone Number:       Current Level of Care: Hospital Recommended Level of Care: Skilled Nursing Facility Prior Approval Number:    Date Approved/Denied:   PASRR Number: [id number]  Discharge Plan: SNF    Current Diagnoses: Patient Active Problem List   Diagnosis Date Noted  . RBBB 11/04/2020  . 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 Acute on chronic diastolic (congestive) heart failure (HCC) 10/28/2020  . Trochanteric fracture of left femur (HCC) 09/20/2020  . BPH (benign prostatic hyperplasia) 09/20/2020  . Mitral regurgitation   . COPD (chronic obstructive pulmonary disease) (HCC)   . Severe aortic stenosis   . Atrial fibrillation, chronic (HCC) 07/15/2020    Orientation RESPIRATION BLADDER Height & Weight     Self,Time,Situation,Place  O2 External catheter,Incontinent Weight: 168 lb 12.8 oz (76.6 kg) Height:  5\' 7"  (170.2 cm)  BEHAVIORAL SYMPTOMS/MOOD NEUROLOGICAL BOWEL NUTRITION STATUS      Continent Diet (please see discharge summary)  AMBULATORY STATUS COMMUNICATION OF NEEDS Skin   Supervision Verbally Surgical wounds (Pressure injury Lft Heel, Closed incision Lft Hip)                       Personal Care Assistance Level of Assistance  Bathing,Feeding,Dressing Bathing Assistance: Limited assistance Feeding assistance: Independent Dressing Assistance: Limited assistance     Functional Limitations Info  Sight,Hearing,Speech Sight Info: Impaired Hearing Info: Adequate Speech Info: Adequate     SPECIAL CARE FACTORS FREQUENCY  PT (By licensed PT),OT (By licensed OT)     PT Frequency: 5x per week OT Frequency: 5x per week            Contractures Contractures Info: Not present    Additional Factors Info  Code Status,Allergies Code Status Info: DNR Allergies Info: NKA           Current Medications (11/04/2020):  This is the current hospital active medication list Current Facility-Administered Medications  Medication Dose Route Frequency Provider Last Rate Last Admin  . 0.9 %  sodium chloride infusion  250 mL Intravenous PRN 01/04/2021, PA-C   Stopped at 11/03/20 11/05/20  . acetaminophen (TYLENOL) tablet 650 mg  650 mg Oral Q6H PRN 8242, PA-C   650 mg at 11/03/20 1349   Or  . acetaminophen (TYLENOL) suppository 650 mg  650 mg Rectal Q6H PRN 11/05/20, PA-C      . albuterol (PROVENTIL) (2.5 MG/3ML) 0.083% nebulizer solution 2.5 mg  2.5 mg Nebulization BID Janetta Hora R, PA-C   2.5 mg at 11/04/20 01/04/21  . aspirin chewable tablet 81 mg  81 mg Oral Daily 3536, PA-C   81 mg at 11/04/20 01/04/21  . Chlorhexidine Gluconate Cloth 2 % PADS 6 each  6 each Topical Daily 1443, PA-C   6 each at 11/03/20 11/05/20  . diclofenac Sodium (VOLTAREN) 1 % topical gel 1 application  1 application Topical TID PRN 1540, PA-C   1 application at 11/03/20 2118  . doxycycline (VIBRA-TABS)  tablet 100 mg  100 mg Oral Q12H Janetta Hora, PA-C   100 mg at 11/04/20 8032  . dutasteride (AVODART) capsule 0.5 mg  0.5 mg Oral Daily Janetta Hora, PA-C   0.5 mg at 11/04/20 1224  . feeding supplement (ENSURE ENLIVE / ENSURE PLUS) liquid 237 mL  237 mL Oral TID WC Tonny Bollman, MD   237 mL at 11/04/20 1232  . ferrous gluconate (FERGON) tablet 324 mg  324 mg Oral Lenn Sink, PA-C   324 mg at 11/03/20 0935  . metoprolol succinate (TOPROL-XL) 24 hr tablet 50 mg  50 mg Oral Daily Janetta Hora, PA-C   50 mg  at 11/03/20 8250  . montelukast (SINGULAIR) tablet 10 mg  10 mg Oral QHS Janetta Hora, PA-C   10 mg at 11/03/20 2114  . morphine 2 MG/ML injection 1-4 mg  1-4 mg Intravenous Q1H PRN Janetta Hora, PA-C      . ondansetron [facility]) injection 4 mg  4 mg Intravenous Q6H PRN Janetta Hora, PA-C      . oxyCODONE (Oxy IR/ROXICODONE) immediate release tablet 5-10 mg  5-10 mg Oral Q3H PRN Janetta Hora, PA-C   5 mg at 11/03/20 0370  . polyethylene glycol (MIRALAX / GLYCOLAX) packet 17 g  17 g Oral Daily PRN Janetta Hora, PA-C      . potassium chloride SA (KLOR-CON) CR tablet 20 mEq  20 mEq Oral Daily Janetta Hora, PA-C   20 mEq at 11/04/20 4888  . senna-docusate (Senokot-S) tablet 2 tablet  2 tablet Oral Daily Janetta Hora, PA-C   2 tablet at 11/04/20 9169  . sodium chloride flush (NS) 0.9 % injection 3 mL  3 mL Intravenous Q12H Janetta Hora, PA-C   3 mL at 11/03/20 2115  . sodium chloride flush (NS) 0.9 % injection 3 mL  3 mL Intravenous PRN Janetta Hora, PA-C      . tamsulosin (FLOMAX) capsule 0.8 mg  0.8 mg Oral QHS Janetta Hora, PA-C   0.8 mg at 11/03/20 2114  . torsemide (DEMADEX) tablet 40 mg  40 mg Oral Daily Janetta Hora, PA-C   40 mg at 11/04/20 4503  . traMADol (ULTRAM) tablet 50-100 mg  50-100 mg Oral Q4H PRN Janetta Hora, PA-C   100 mg at 11/04/20 8882  . umeclidinium bromide (INCRUSE ELLIPTA) 62.5 MCG/INH 1 puff  1 puff Inhalation Daily Janetta Hora, PA-C   1 puff at 11/04/20 8003  . vitamin B-12 (CYANOCOBALAMIN) tablet 500 mcg  500 mcg Oral Daily Janetta Hora, PA-C   500 mcg at 11/04/20 4917  . Warfarin - Pharmacist Dosing Inpatient   Does not apply q1600 Mosetta Anis [facility]   Given at 11/03/20 1701     Discharge Medications: Please see discharge summary for a list of discharge medications.  Relevant Imaging Results:  Relevant Lab Results:   Additional Information SSN  [id number]  Eduard Roux, LCSW

## 2020-11-04 NOTE — Progress Notes (Addendum)
ANTICOAGULATION CONSULT NOTE  Pharmacy Consult for warfarin Indication: atrial fibrillation  No Known Allergies  Patient Measurements: Height: 5\' 7"  (170.2 cm) Weight: 76.6 kg (168 lb 12.8 oz) IBW/kg (Calculated) : 66.1 Heparin Dosing Weight: 80 kg  Vital Signs: Temp: 97.4 F (36.3 C) (04/01 0300) Temp Source: Oral (04/01 0300) BP: 120/68 (04/01 0300) Pulse Rate: 72 (04/01 0300)  Labs: Recent Labs    11/02/20 0429 11/03/20 0118 11/04/20 0149  HGB 9.3* 9.1* 9.3*  HCT 30.3* 28.7* 29.7*  PLT 181 150 154  LABPROT  --   --  14.8  INR  --   --  1.2  CREATININE 0.75 0.85 0.80    Estimated Creatinine Clearance: 56.2 mL/min (by C-G formula based on SCr of 0.8 mg/dL).   Medical History: Past Medical History:  Diagnosis Date  . Arthritis   . CHF (congestive heart failure) (HCC)   . COPD (chronic obstructive pulmonary disease) (HCC)   . Mitral regurgitation   . Persistent atrial fibrillation (HCC)    on coumadin   . RBBB   . 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   . Severe aortic stenosis      Assessment: 87 y.o. male with h/o Afib on warfarin PTA held for TAVR workup. Pt now s/p TAVR 3/29, pharmacy asked to resume warfarin with likey discharge today.Home regimen is 3.5mg  daily. -INR= 1.2   Goal of Therapy:  Heparin level 0.3-0.7 units/ml Monitor platelets by anticoagulation protocol: Yes   Plan:  Would discharge on warfarin 3.5mg  po daily Consider INR early next week  4/29, PharmD Clinical Pharmacist **Pharmacist phone directory can now be found on [email or web address] ([email or web address]).  Listed under [facility].  Addendum -plans to discharge 4/2  Plan -Warfarin 5mg  tonight -Daily PT/INR  6/2, PharmD Clinical Pharmacist **Pharmacist phone directory can now be found on [email or web address] (PW [email or web address]).  Listed under [facility].

## 2020-11-05 DIAGNOSIS — I5033 Acute on chronic diastolic (congestive) heart failure: Secondary | ICD-10-CM | POA: Diagnosis not present

## 2020-11-05 LAB — CBC
HCT: 29.5 % — ABNORMAL LOW (ref 39.0–52.0)
Hemoglobin: 9.3 g/dL — ABNORMAL LOW (ref 13.0–17.0)
MCH: 32.3 pg (ref 26.0–34.0)
MCHC: 31.5 g/dL (ref 30.0–36.0)
MCV: 102.4 fL — ABNORMAL HIGH (ref 80.0–100.0)
Platelets: 162 10*3/uL (ref 150–400)
RBC: 2.88 MIL/uL — ABNORMAL LOW (ref 4.22–5.81)
RDW: 15.9 % — ABNORMAL HIGH (ref 11.5–15.5)
WBC: 7.1 10*3/uL (ref 4.0–10.5)
nRBC: 0 % (ref 0.0–0.2)

## 2020-11-05 LAB — BASIC METABOLIC PANEL
Anion gap: 6 (ref 5–15)
BUN: 26 mg/dL — ABNORMAL HIGH (ref 8–23)
CO2: 42 mmol/L — ABNORMAL HIGH (ref 22–32)
Calcium: 8.5 mg/dL — ABNORMAL LOW (ref 8.9–10.3)
Chloride: 86 mmol/L — ABNORMAL LOW (ref 98–111)
Creatinine, Ser: 0.82 mg/dL (ref 0.61–1.24)
GFR, Estimated: >60 mL/min (ref >60)
Glucose, Bld: 103 mg/dL — ABNORMAL HIGH (ref 70–99)
Potassium: 3.9 mmol/L (ref 3.5–5.1)
Sodium: 134 mmol/L — ABNORMAL LOW (ref 135–145)

## 2020-11-05 LAB — PROTIME-INR
INR: 1.1 (ref 0.8–1.2)
Prothrombin Time: 14.2 seconds (ref 11.4–15.2)

## 2020-11-05 MED ORDER — TORSEMIDE 20 MG PO TABS: 20.0000 mg | ORAL_TABLET | Freq: Every day | ORAL | Status: DC

## 2020-11-05 MED ORDER — GUAIFENESIN ER 600 MG PO TB12: 600.0000 mg | ORAL_TABLET | Freq: Two times a day (BID) | ORAL | Status: AC | PRN

## 2020-11-05 MED ORDER — DOXYCYCLINE HYCLATE 100 MG PO TABS: 100.0000 mg | ORAL_TABLET | Freq: Two times a day (BID) | ORAL | 0 refills | Status: DC

## 2020-11-05 NOTE — Progress Notes (Addendum)
Progress Note  Patient Name: Ruben [name] Date of Encounter: 11/05/2020  [facility] Cardiologist: Reatha Harps, MD  TAVR: Dr. Excell Seltzer and Dr. Laneta Simmers  Subjective   Patient was initially going to be discharged to [facility] yesterday but felt more lethargic and was kept for observation for an additional night. Says he slept well overnight and is eager to leave the hospital. No chest pain or palpitations. Breathing significantly improved.   Inpatient Medications    Scheduled Meds: . albuterol  2.5 mg Nebulization BID  . aspirin  81 mg Oral Daily  . Chlorhexidine Gluconate Cloth  6 each Topical Daily  . doxycycline  100 mg Oral Q12H  . dutasteride  0.5 mg Oral Daily  . feeding supplement  237 mL Oral TID WC  . ferrous gluconate  324 mg Oral QODAY  . metoprolol succinate  50 mg Oral Daily  . montelukast  10 mg Oral QHS  . potassium chloride  20 mEq Oral Daily  . senna-docusate  2 tablet Oral Daily  . sodium chloride flush  3 mL Intravenous Q12H  . tamsulosin  0.8 mg Oral QHS  . torsemide  40 mg Oral Daily  . umeclidinium bromide  1 puff Inhalation Daily  . vitamin B-12  500 mcg Oral Daily  . Warfarin - Pharmacist Dosing Inpatient   Does not apply q1600   Continuous Infusions: . sodium chloride Stopped (11/03/20 0755)   PRN Meds: sodium chloride, acetaminophen **OR** acetaminophen, diclofenac Sodium, guaiFENesin, morphine injection, ondansetron (ZOFRAN) IV, oxyCODONE, polyethylene glycol, sodium chloride flush, traMADol   Vital Signs    Vitals:   11/04/20 2315 11/05/20 0350 11/05/20 0500 11/05/20 0818  BP: 109/68 112/66  (!) 100/54  Pulse: 76 91  98  Resp: 19 20  20   Temp: 97.8 F (36.6 C) 97.8 F (36.6 C)  (!) 97.5 F (36.4 C)  TempSrc: Oral Oral  Oral  SpO2: (!) 85% 96%  95%  Weight:   85 kg   Height:        Intake/Output Summary (Last 24 hours) at 11/05/2020 0821 Last data filed at 11/04/2020 1500 Gross per 24 hour  Intake --  Output 1000 ml  Net -1000  ml   Last 3 Weights 11/05/2020 11/04/2020 11/04/2020  Weight (lbs) 187 lb 8 oz 188 lb 11.4 oz 168 lb 12.8 oz  Weight (kg) 85.049 kg 85.6 kg 76.567 kg      Telemetry    Atrial fibrillation/flutter, HR in 80's to 90's.  - Personally Reviewed  ECG    No new tracings.   Physical Exam   GEN: Elderly male appearing in no acute distress.   Neck: No JVD Cardiac: Irregularly irregular, 2/6 systolic murmur along Apex.  Respiratory: Clear to auscultation bilaterally. GI: Soft, nontender, non-distended  MS: Trace lower extremity edema. No deformity. Neuro:  Nonfocal  Psych: Normal affect   Labs    High Sensitivity Troponin:  No results for input(s): TROPONINIHS in the last 720 hours.    Chemistry Recent Labs  Lab 10/31/20 0958 11/01/20 0347 11/03/20 0118 11/04/20 0149 11/05/20 0118  NA 140   < > 136 137 134*  K 3.7   < > 3.5 3.6 3.9  CL 90*   < > 93* 92* 86*  CO2 39*   < > 38* 41* 42*  GLUCOSE 104*   < > 109* 116* 103*  BUN 22   < > 20 26* 26*  CREATININE 1.01   < > 0.85  0.80 0.82  CALCIUM 9.1   < > 8.3* 8.5* 8.5*  PROT 7.0  --   --   --   --   ALBUMIN 3.2*  --   --   --   --   AST 22  --   --   --   --   ALT 11  --   --   --   --   ALKPHOS 122  --   --   --   --   BILITOT 0.9  --   --   --   --   GFRNONAA >60   < > >60 >60 >60  ANIONGAP 11   < > 5 4* 6   < > = values in this interval not displayed.     Hematology Recent Labs  Lab 11/03/20 0118 11/04/20 0149 11/05/20 0118  WBC 6.5 6.1 7.1  RBC 2.90* 2.92* 2.88*  HGB 9.1* 9.3* 9.3*  HCT 28.7* 29.7* 29.5*  MCV 99.0 101.7* 102.4*  MCH 31.4 31.8 32.3  MCHC 31.7 31.3 31.5  RDW 16.1* 16.1* 15.9*  PLT 150 154 162    BNPNo results for input(s): BNP, PROBNP in the last 168 hours.   DDimer No results for input(s): DDIMER in the last 168 hours.   Radiology    DG HIP PORT UNILAT WITH PELVIS 1V LEFT  Result Date: 11/03/2020 CLINICAL DATA:  Prior ORIF. EXAM: DG HIP (WITH OR WITHOUT PELVIS) 1V PORT LEFT COMPARISON:   09/20/2020. FINDINGS: Prior ORIF left hip. Hardware intact. Anatomic alignment. Left femoral neck fracture lucency again identified. Fracture fragments adjacent to the left hip again noted. Degenerative change left hip again noted. Probable bone island left acetabulum again noted. No new abnormality. Pelvic calcifications consistent phleboliths. Peripheral vascular calcification. IMPRESSION: Prior ORIF left hip. Anatomic alignment. Exam appears stable from prior exam. Electronically Signed   By: Maisie Fus  Register   On: 11/03/2020 11:58    Cardiac Studies   Limited Echo: 11/02/2020 IMPRESSIONS    1. Left ventricular ejection fraction, by estimation, is 55 to 60%. The  left ventricle has normal function. The left ventricle has no regional  wall motion abnormalities. Left ventricular diastolic parameters are  indeterminate.  2. Right ventricular systolic function is mildly reduced. The right  ventricular size is mildly enlarged.  3. Left atrial size was mildly dilated.  4. Right atrial size was moderately dilated.  5. The pericardial effusion is posterior and lateral to the left  ventricle.  6. Splay artifact previous TEE suggests small flail P2 segment with  anteriorly directed MR. The mitral valve is abnormal. Moderate mitral  valve regurgitation. No evidence of mitral stenosis. Severe mitral annular  calcification.  7. Tricuspid valve regurgitation is moderate.  8. Post TAVR with 26 mm Sapien 3 valve no PVL mean gradient 8 peak 14  mmHg AVA 2.3 cm2 DVI 0.44. The aortic valve has been repaired/replaced.  Aortic valve regurgitation is not visualized. No aortic stenosis is  present. There is a 26 mm Sapien prosthetic  (TAVR) valve present in the aortic position. Procedure Date: 11/01/2020.  9. The inferior vena cava is normal in size with greater than 50%  respiratory variability, suggesting right atrial pressure of 3 mmHg.   Patient Profile     87 y.o. male w/PMH of  severemitral regurgitation, chronic diastolic CHF, HTN, COPDon home 02, longstanding persistent atrial fibrillation, RBBB, degenerative arthritis, severe aortic stenosis and recent mechanical fall with hip fracture (s/p ORIFin 09/2020) who was  admitted for recurrent CHF and underwent TAVR on 11/01/2020.  Assessment & Plan    1. Acute on Chronic Diastolic CHF Exacerbation - Admitted with worsening volume overload and BNP elevated to 498 on admission and CXR consistent with CHF. He was initially receiving IV Lasix but has been transitioned back to Torsemide 40mg  daily. Will adjust to 20mg  daily at discharge as previously recommended by Structural Heart.  Weight was at 195 lbs on admission and improved to 187 lbs today and creatinine remains stable at 0.82. Will continue Torsemide at current dosing with plans for a repeat BMET as an outpatient.   2. Severe AS - He underwent TAVR on 11/01/2020 with a 26 mm Edwards Sapien 3 Ultra THV via the TF approach. Repeat echo shows a normally functioning TAVR with a mean gradient of 8 mmHg and no PVL. He does have residual moderate MR/TR and trivial pericardial effusion.   3. Permanent Atrial Fibrillation - He did have bradycardia in the post-operative setting but this has now resolved and pacer wire has been removed. He has been restarted on Toprol-XL 50mg  daily and rates are stable in the 80's to 90's by review of telemetry with no evidence of recurrent bradycardia. He is being discharged with a Zio AT which has been arranged by the Structural Heart Team. - He has been restarted on Coumadin for anticoagulation. Appreciate Pharmacy's assistance with dosing.   4. Moderate to Severe MR - Structural Heart Team following and hoping this will improve s/p TAVR. Post-op echo showed moderate MR as outlined above.   5. Anemia - Hgb stable at 9.3 this AM and he denies any evidence of active bleeding. Continue to follow as an outpatient.   6. Left Heel Pain - He did  have a left heel ulcer which is being treated with Doxycycline for 10 days (Day 6/10 as of 4/2).   7. Recent Hip Fracture - He underwent ORIF in 09/2020. He has been working with PT with plans to discharge back to [facility] for continued rehab.    For questions or updates, please contact [facility] Please consult [email or web address] for contact info under        Signed, , PA-C  11/05/2020, 8:21 AM    Patient seen and examined with [facility] PA.  Agree as above, with the following exceptions and changes as noted below. Feeling better today, still with some fatigue but improved. Seen with his son at bedside. Both feel he is ready for hospital discharge. Gen: NAD, CV: iRRR, 3/6 HSM axilla, Lungs: clear, Abd: soft, Extrem: Warm, well perfused, trace ankle edema L>R, Neuro/Psych: alert and oriented x 3, normal mood and affect. All available labs, radiology testing, previous records reviewed. Stable post TAVR echo with no PVL and mean gradient 8 mmHg. Zio in place. RBBB and atrial flutter on tele but no evidence of further conduction delay, pauses or HB. Stable for discharge.  Ellsworth Lennox, MD 11/05/20 9:26 AM

## 2020-11-05 NOTE — TOC Transition Note (Signed)
Transition of Care [facility]) - CM/SW Discharge Note   Patient Details  Name: NAGEE GOATES MRN: [id number] Date of Birth: 1927-06-22  Transition of Care [facility]) CM/SW Contact:  Patrice Paradise, LCSW Phone Number: [phone] 11/05/2020, 10:42 AM   Clinical Narrative:    Patient will DC to:?[facility] Anticipated DC date:?11/05/2020 Family notified:?Son Transport by: Sharin Mons   Per MD patient ready for DC to [facility]. RN, patient, patient's family, and facility notified of DC. Discharge Summary sent to facility. RN given number for report [phone] ask for RN in [place]. DC packet on chart. Ambulance transport requested for patient.   CSW signing off.   Judd Lien, [place] [phone]    Final next level of care: Skilled Nursing Facility Barriers to Discharge: Barriers Resolved   Patient Goals and CMS Choice        Discharge Placement              Patient chooses bed at: [facility] Patient to be transferred to facility by: [facility] Name of family member notified: Rondall,son Patient and family notified of of transfer: 11/04/20  Discharge Plan and Services In-house Referral: Clinical Social Work                                   Social Determinants of Health (SDOH) Interventions     Readmission Risk Interventions No flowsheet data found.

## 2020-11-05 NOTE — Progress Notes (Signed)
Pt is being discharged, IV removed, VSS. [facility] assisted for transportation.   Eimi Viney K Raford Brissett, RN  

## 2020-11-07 ENCOUNTER — Telehealth: Payer: Self-pay | Admitting: Physician Assistant

## 2020-11-07 LAB — POCT ACTIVATED CLOTTING TIME
Activated Clotting Time: 181 seconds
Activated Clotting Time: 156 seconds

## 2020-11-07 NOTE — Telephone Encounter (Signed)
  [facility]   MULTIDISCIPLINARY HEART VALVE TEAM   Patient contacted regarding discharge from [facility] on 4/2  Patient understands to follow up with provider Carlean Jews on 4/7 at [facility].  Patient understands discharge instructions? yes Patient understands medications and regimen? yes Patient understands to bring all medications to this visit? yes   Pt still pretty sleepy but overall doing okay.    Cline Crock PA-C  [facility]    ,

## 2020-11-08 NOTE — Telephone Encounter (Signed)
See result note dated 10/14/2020 as pt was admitted to the hospital per J.Bowers,RN.

## 2020-11-09 NOTE — Progress Notes (Signed)
HEART AND [employer]                                     Cardiology Office Note:    Date:  11/10/2020   ID:  Ruben [name], DOB [birth year], MRN [id number]  PCP:  Alroy Dust, L.Marlou Sa, [facility] Cardiologist:  Evalina Field, MD / Dr. Burt Knack & Dr Cyndia Bent (TAVR) [place] Electrophysiologist:  None   Referring MD: Alroy Dust, Carlean Jews.Marlou Sa, MD   [facility] s/p TAVR  History of Present Illness:    Ruben [name] is a 87 y.o. male with a hx of RBBB, severemitral regurgitation, HTN, [facility] home 02, longstanding persistent atrial fibrillation on warfarin, degenerative arthritis, mechanical fall with hip fracture s/p ORIFand chronic diastolic CHFwith recent admission and severe AS s/p TAVR (11/01/20) who presents to clinic for follow up.   The patient is well known to our structural heart team. He has known severe aortic stenosis and was referred to [place] for TAVR work up. Upon initial consultation he was afunctionally independent and remarkably active for a gentleman forhis age.  [facility] his son, Ruben [name] (a [employer]). In12/2021 he was hospitalized with acute hypoxic respiratory failure felt related to a combination of COPD with acute exacerbation, rhinovirus infection, and acute on chronic diastolic congestive heart failure. Symptoms improved with antibiotics, diuresis, and nebulized bronchodilators. Transthoracic echocardiogram performed at that time revealedEF 55%, mean grad 37 mmHg, peak grad 63.4 mmHg, AVA 0.87 cm2, DVI 0.18,as well as severe MAC with mild MS/MR.He was ultimately discharged home on home oxygen therapy.   He was referred to the multidisciplinary heart valve clinic and was evaluated by Dr. Burt Knack. Diagnostic cardiac catheterization 08/24/20 revealed diffuse calcific nonobstructive coronary artery disease with severe aortic stenosis and mild pulmonary hypertension.There  was some question about splay artifact and severe MR and TEE was recommended.TEE2/2/22 confirmed severe AS andrevealed severe mitral regurgitation with a small flail segment of the middle scallop of the posterior leaflet and an eccentric jet of mitral regurgitation with flow reversal in the pulmonary veins. There was severe mitral annular calcification and functional anatomy of the valve did not appear amenable to transcatheter edge-to-edge repair.   The patient has beenextensivelyevaluated by the multidisciplinary valve team and felt to have severe, symptomatic aortic stenosis and to be a suitable candidate for TAVR, which was originallyset up for 09/20/20. On his way to the hospital for his valve surgery on 2/15, the patient suffered a mechanical fall while trying to get into the car that resulted in a hip fracture. He underwent successful ORIF by Dr. Percell Miller on 09/21/20. He was discharged to [facility] for short term rehabilitation. I saw him in the office on 10/12/20 for follow up. He was noted to be in acute heart failure with 20 lb weight gain (up to 212lbs with baseline weight of 190lbs)and making slow progress with hip rehabilitation. Outpatient diuresis was attempted but he was ultimately admitted from 3/10-3/14/22 for IV diuresis, discharge weight 201.7 lbs.He struggled with heart failure and was ultimately readmitted 3/25 for recurrent CHF. He underwent aggressive IV diuresis and TAVR while admitted. He underwent successful TAVR with a 26 mm Edwards Sapien 3 Ultra THV via the TF approach on 11/01/20. Post operative echoshowedEF55%, normally functioning TAVR with a mean gradient of60mHg and noPVL. There is moderate MR/TR  and trivial pericardial effusion. His weight came down from 195 to187 lbs (previous baseline felt to be 190 lbs). He was discharged back to his SNF for continued rehab. Torsemide cute back to 17m daily given soft BPs and significant weight loss. Given post op  bradycardia and underlying RBBB he was discharged with a Zio patch. He also had a painful left heal ulcer that was treated with doxy.  Today he presents to clinic for follow up. No CP or SOB. Legs a little more swollen today but no orthopnea or PND. No dizziness or syncope. No blood in stool or urine. No palpitations. [facility] Mostly limited by hip and heel pain  Past Medical History:  Diagnosis Date  . Arthritis   . CHF (congestive heart failure) ([place]   . COPD (chronic obstructive pulmonary disease) ([place]   . Mitral regurgitation   . Persistent atrial fibrillation (HCC)    on coumadin   . RBBB   . 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 Severe aortic stenosis     Past Surgical History:  Procedure Laterality Date  . APPENDECTOMY    . BUBBLE STUDY  09/07/2020   Procedure: BUBBLE STUDY;  Surgeon: OGeralynn Rile MD;  [facility]  Service: Cardiovascular;;  . CARDIAC CATHETERIZATION    . CLAVICLE SURGERY    . INTRAMEDULLARY (IM) NAIL INTERTROCHANTERIC Left 09/20/2020   Procedure: INTRAMEDULLARY (IM) NAIL INTERTROCHANTRIC;  Surgeon: MRenette Butters MD;  Location: [facility]  Service: Orthopedics;  Laterality: Left;  . RIGHT/LEFT HEART CATH AND CORONARY ANGIOGRAPHY N/A 08/24/2020   Procedure: RIGHT/LEFT HEART CATH AND CORONARY ANGIOGRAPHY;  Surgeon: CSherren Mocha MD;  [facility];  Service: Cardiovascular;  Laterality: N/A;  . TEE WITHOUT CARDIOVERSION N/A 09/07/2020   Procedure: TRANSESOPHAGEAL ECHOCARDIOGRAM (TEE);  Surgeon: OGeralynn Rile MD;  [facility]  Service: Cardiovascular;  Laterality: N/A;  . TOTAL KNEE ARTHROPLASTY Bilateral   . TRANSCATHETER AORTIC VALVE REPLACEMENT, TRANSFEMORAL N/A 11/01/2020   Procedure: TRANSCATHETER AORTIC VALVE REPLACEMENT, TRANSFEMORAL;  Surgeon: CSherren Mocha  MD;  [facility];  Service: Open Heart Surgery;  Laterality: N/A;    Current Medications: Current Meds  Medication Sig  . amoxicillin (AMOXIL) 500 MG tablet Take 4 tablets (2,000 mg) one hour prior to all dental visits.     Allergies:   Patient has no known allergies.   Social History   Socioeconomic History  . Marital status: Married    Spouse name: Not on file  . Number of children: Not on file  . Years of education: Not on file  . Highest education level: Not on file  Occupational History  . Occupation: retired  Tobacco Use  . Smoking status: Former Smoker    Packs/day: 1.00    Years: 30.00    Pack years: 30.00    Quit date: 1975    Years since quitting: 47.2  . Smokeless tobacco: Never Used  Vaping Use  . Vaping Use: Never used  Substance and Sexual Activity  . Alcohol use: Not Currently  . Drug use: Never  . Sexual activity: Not on file  Other Topics Concern  . Not on file  Social History Narrative  . Not on file   Social Determinants of Health   Financial Resource Strain: Not on file  Food Insecurity:  No Food Insecurity  . Worried About Charity fundraiser in the Last Year: Never true  . Ran Out of Food in the Last Year: Never true  Transportation Needs: No Transportation Needs  . Lack of Transportation (Medical): No  . Lack of Transportation (Non-Medical): No  Physical Activity: Not on file  Stress: Not on file  Social Connections: Not on file     Family History: The patient's*family history is not on file.  ROS:   Please see the history of present illness.    All other systems reviewed and are negative.  EKGs/Labs/Other Studies Reviewed:    The following studies were reviewed today:   TAVR OPERATIVE NOTE   Date of Procedure:11/01/2020  Preoperative Diagnosis:Severe Aortic Stenosis   Postoperative Diagnosis:Same   Procedure:   Transcatheter Aortic Valve Replacement - Percutaneous  Transfemoral Approach Edwards Sapien 3 UltraTHV (size27m, model # [id number] serial ##[id number]  Co-Surgeons:Bryan KAlveria Apley MD and MSherren Mocha MD  Anesthesiologist:Kyllian GLissa Hoard MD  Echocardiographer:Peter NJohnsie Cancel MD  Pre-operative Echo Findings: ? Severe aortic stenosis ? Normalleft ventricular systolic function ? 3+ MR  Post-operative Echo Findings: ? Noparavalvular leak ? Normal/unchangedleft ventricular systolic function  ________________________   Echo 11/02/20: IMPRESSIONS  1. Left ventricular ejection fraction, by estimation, is 55 to 60%. The  left ventricle has normal function. The left ventricle has no regional  wall motion abnormalities. Left ventricular diastolic parameters are  indeterminate.  2. Right ventricular systolic function is mildly reduced. The right  ventricular size is mildly enlarged.  3. Left atrial size was mildly dilated.  4. Right atrial size was moderately dilated.  5. The pericardial effusion is posterior and lateral to the left  ventricle.  6. Splay artifact previous TEE suggests small flail P2 segment with  anteriorly directed MR. The mitral valve is abnormal. Moderate mitral  valve regurgitation. No evidence of mitral stenosis. Severe mitral annular  calcification.  7. Tricuspid valve regurgitation is moderate.  8. Post TAVR with 26 mm Sapien 3 valve no PVL mean gradient 8 peak 14  mmHg AVA 2.3 cm2 DVI 0.44. The aortic valve has been repaired/replaced.  Aortic valve regurgitation is not visualized. No aortic stenosis is  present. There is a 26 mm Sapien prosthetic  (TAVR) valve present in the aortic position. Procedure Date: 11/01/2020.  9. The inferior vena cava is normal in size with greater than 50%  respiratory variability, suggesting right atrial pressure of 3 mmHg.   EKG:  EKG is ordered today.  The ekg ordered today  demonstrates chropnic afib with RBBB  Recent Labs: 07/15/2020: TSH 0.398 10/12/2020: NT-Pro BNP 7,048 10/28/2020: B Natriuretic Peptide 498.5 10/31/2020: ALT 11 11/02/2020: Magnesium 1.6 11/05/2020: BUN 26; Creatinine, Ser 0.82; Hemoglobin 9.3; Platelets 162; Potassium 3.9; Sodium 134  Recent Lipid Panel No results found for: CHOL, TRIG, HDL, CHOLHDL, VLDL, LDLCALC, LDLDIRECT   Risk Assessment/Calculations:    CHA2DS2-VASc Score = 5  This indicates a 7.2% annual risk of stroke. The patient's score is based upon: CHF History: Yes HTN History: Yes Diabetes History: Yes Stroke History: No Vascular Disease History: No Age Score: 2 Gender Score: 0    Physical Exam:    VS:  Ht _0  (1.676 m)   Wt 187 lb 3.2 oz (84.9 kg)   BMI 30.21 kg/m     Wt Readings from Last 3 Encounters:  11/10/20 187 lb 3.2 oz (84.9 kg)  11/05/20 187 lb 8 oz (85 kg)  10/26/20 199 lb (90.3 kg)  GEN:  Well nourished, well developed in no acute distress, in wheelchair HEENT: Normal NECK: No JVD; No carotid bruits LYMPHATICS: No lymphadenopathy CARDIAC: irreg irreg, 2/6 holosystolic murmur at apex. No rubs, gallops RESPIRATORY:  Clear to auscultation without rales, wheezing or rhonchi  ABDOMEN: Soft, non-tender, non-distended MUSCULOSKELETAL:  No edema; No deformity  SKIN: Warm and dry.  Groin sites clear without hematoma or ecchymosis. Bandages removed NEUROLOGIC:  Alert and oriented x 3 PSYCHIATRIC:  Normal affect   ASSESSMENT:    1. S/P TAVR (transcatheter aortic valve replacement)   2. Diastolic congestive heart failure, unspecified HF chronicity ([place])   3. Atrial fibrillation, chronic ([place])   4. Mitral valve insufficiency, unspecified etiology   5. Panlobular emphysema ([place])   6. Ulcer of left foot, unspecified ulcer stage ([place])   7. Type III open fracture of left hip with routine healing, subsequent encounter   8. Severe aortic stenosis   9. RBBB    PLAN:    In order of problems listed  above:  Severe AS s/p TAVR: doing great. Breathing much better. Making a lot of progress at [facility]. Mostly limited by hip and heel pain. Continue on Coumadin alone. SBE prophylaxis discussed; I have RX'd amoxicillin. I will see him back next month for echo and follow up.   Chronic diastolic CHF: overall looks evuvolemic and weight stable around 187 but legs look a little more swollen. Increase torsemide from 82m to 448mdaily and Kdur from 1034mto 63m21mBm,et today  Chronic afib:rate well controlled today. Back on coumadin. Check INR today. He see's his PCP on 4/20 who manages his Coumadin   Moderate to severe MR:pOJ:JKKXechoreportedmoderate MR.Anatomy not amendable for TEER.  COPD: continued on home inhalers.  Left heel ulcer: still painful . Finished 10 day course of doxy. Wrapped today and being followed by PCP  [facility]  RBBB: given chronic RBBB and profound bradycardia after TAVR, he was DC'd with a Zio AT. This has not shown any HAVB to date.   Medication Adjustments/Labs and Tests Ordered: Current medicines are reviewed at length with the patient today.  Concerns regarding medicines are outlined above.  Orders Placed This Encounter  Procedures  . Basic metabolic panel  . Protime-INR  . EKG 12-Lead   Meds ordered this encounter  Medications  . torsemide 40 MG TABS    Sig: Take 40 mg by mouth daily.  . potassium chloride (KLOR-CON) 20 MEQ tablet    Sig: Take 1 tablet (20 mEq total) by mouth daily.    Dispense:  90 tablet    Refill:  3  . amoxicillin (AMOXIL) 500 MG tablet    Sig: Take 4 tablets (2,000 mg) one hour prior to all dental visits.    Dispense:  8 tablet    Refill:  11    Patient Instructions  Medication Instructions:  1) INCREASE TORSEMIDE to 40 mg daily 2) INCREASE KDUR to 20 meq daily  *If you need a refill on your cardiac medications before your next appointment, please call your  pharmacy*  Lab Work: TODAY! BMET, INR If you have labs (blood work) drawn today and your tests are completely normal, you will receive your results only by: . MyMarland Kitchenhart Message (if you have MyChart) OR . A paper copy in the mail If you have any lab test that is abnormal or we need to change your treatment, we will call you to review the results.  Follow-Up: Please keep your appointments as scheduled!  Please follow a 2,000 mL a day fluid restriction.    Signed, Angelena Form, PA-C  11/10/2020 3:05 PM    [facility]

## 2020-11-10 ENCOUNTER — Telehealth: Payer: Self-pay | Admitting: Cardiology

## 2020-11-10 ENCOUNTER — Ambulatory Visit (INDEPENDENT_AMBULATORY_CARE_PROVIDER_SITE_OTHER): Payer: Medicare Other | Admitting: Physician Assistant

## 2020-11-10 ENCOUNTER — Other Ambulatory Visit: Payer: Self-pay

## 2020-11-10 VITALS — Ht 66.0 in | Wt 187.2 lb

## 2020-11-10 DIAGNOSIS — L97529 Non-pressure chronic ulcer of other part of left foot with unspecified severity: Secondary | ICD-10-CM

## 2020-11-10 DIAGNOSIS — I340 Nonrheumatic mitral (valve) insufficiency: Secondary | ICD-10-CM

## 2020-11-10 DIAGNOSIS — Z952 Presence of prosthetic heart valve: Secondary | ICD-10-CM | POA: Diagnosis not present

## 2020-11-10 DIAGNOSIS — I482 Chronic atrial fibrillation, unspecified: Secondary | ICD-10-CM

## 2020-11-10 DIAGNOSIS — I5030 Unspecified diastolic (congestive) heart failure: Secondary | ICD-10-CM | POA: Diagnosis not present

## 2020-11-10 DIAGNOSIS — I4510 Unspecified right bundle-branch block: Secondary | ICD-10-CM

## 2020-11-10 DIAGNOSIS — J431 Panlobular emphysema: Secondary | ICD-10-CM

## 2020-11-10 DIAGNOSIS — I350 Nonrheumatic aortic (valve) stenosis: Secondary | ICD-10-CM

## 2020-11-10 DIAGNOSIS — S72002F Fracture of unspecified part of neck of left femur, subsequent encounter for open fracture type IIIA, IIIB, or IIIC with routine healing: Secondary | ICD-10-CM

## 2020-11-10 LAB — BASIC METABOLIC PANEL
BUN/Creatinine Ratio: 25 — ABNORMAL HIGH (ref 10–24)
BUN: 21 mg/dL (ref 10–36)
CO2: 35 mmol/L — ABNORMAL HIGH (ref 20–29)
Calcium: 8.9 mg/dL (ref 8.6–10.2)
Chloride: 93 mmol/L — ABNORMAL LOW (ref 96–106)
Creatinine, Ser: 0.84 mg/dL (ref 0.76–1.27)
Glucose: 97 mg/dL (ref 65–99)
Potassium: 3.9 mmol/L (ref 3.5–5.2)
Sodium: 138 mmol/L (ref 134–144)
eGFR: 82 mL/min/{1.73_m2} (ref >59)

## 2020-11-10 LAB — PROTIME-INR
INR: 1.2 (ref 0.9–1.2)
Prothrombin Time: 12.7 s — ABNORMAL HIGH (ref 9.1–12.0)

## 2020-11-10 MED ORDER — POTASSIUM CHLORIDE CRYS ER 20 MEQ PO TBCR: 20.0000 meq | EXTENDED_RELEASE_TABLET | Freq: Every day | ORAL | 3 refills | Status: DC

## 2020-11-10 MED ORDER — AMOXICILLIN 500 MG PO TABS: ORAL_TABLET | ORAL | 11 refills | Status: AC

## 2020-11-10 MED ORDER — TORSEMIDE 40 MG PO TABS: 40.0000 mg | ORAL_TABLET | Freq: Every day | ORAL | Status: DC

## 2020-11-10 NOTE — Patient Instructions (Addendum)
Medication Instructions:  1) INCREASE TORSEMIDE to 40 mg daily 2) INCREASE KDUR to 20 meq daily  *If you need a refill on your cardiac medications before your next appointment, please call your pharmacy*  Lab Work: TODAY! BMET, INR If you have labs (blood work) drawn today and your tests are completely normal, you will receive your results only by: Marland Kitchen MyChart Message (if you have MyChart) OR . A paper copy in the mail If you have any lab test that is abnormal or we need to change your treatment, we will call you to review the results.  Follow-Up: Please keep your appointments as scheduled!  Please follow a 2,000 mL a day fluid restriction.

## 2020-11-10 NOTE — Telephone Encounter (Signed)
Vira Agar from [facility] is calling for Unknown Foley to clarify some orders. She can be reached at [phone].

## 2020-11-10 NOTE — Telephone Encounter (Signed)
Clarified that amoxicillin 2g (four 500 mg tablets) is to be taken 1 hour prior to dental appointments only. She was grateful for call.

## 2020-11-24 ENCOUNTER — Telehealth: Payer: Self-pay

## 2020-11-24 NOTE — Telephone Encounter (Signed)
Late Entry  The pt has returned to his son's home and is undergoing PT.  The therapist noted that the pt's BP is low at 88/50, pulse 67 and irregular (pt has known AFib).  The pt is not symptomatic at this time.  Per the pt's son the pt does not have swelling in his lower extremities and his weight has remained stable between 186-188 lbs.  The pt's torsemide and potassium were recently increased at 4/7 OV (Increase torsemide from 20mg  to 40mg  daily and Kdur from to ). At this time I advised the pt to drop torsemide and Kdur back to previous dosage.  The pt will monitor BP, weight and monitor for signs of SOB or swelling.  If the pt's weight increases, or he develops SOB or swelling then he will need to go back to higher dosage of medication.  Pt is high risk for falls due to age and recent hip fracture. In review of BP trend he does tend to run on the lower side of normal. I will forward this message to PA-C to make her aware of BP and any further orders.

## 2020-11-29 ENCOUNTER — Telehealth: Payer: Self-pay | Admitting: Physician Assistant

## 2020-11-29 NOTE — Telephone Encounter (Signed)
Spoke with Carlean Jews prior to returning [place], RN's call. Confirmed with Christy the patient's BP is "good." Per Florentina Addison, instructed Christy to INCREASE TORSEMIDE and K back to 40 mg and 20 meq, respectively, until swelling has resolved. When swelling resolves, the patient will alternate torsemide 20 mg and kdur 10 meq with torsemide 40 mg and kdur 20 meq qod. Confirmed visit next week. [name] will call [place] and let him know and call back with questions/concerns. She was grateful for call and agrees with plan.

## 2020-11-29 NOTE — Telephone Encounter (Signed)
Pt c/o swelling: STAT is pt has developed SOB within 24 hours  1) How much weight have you gained and in what time span? 3.5 lbs in 4 days   2) If swelling, where is the swelling located? Legs   3) Are you currently taking a fluid pill? Yes, but was decreased due to low BP   4) Are you currently SOB? No   5) Do you have a log of your daily weights (if so, list)? Son does, does not have it with her going up about a pound a day as of yesterday   6) Have you gained 3 pounds in a day or 5 pounds in a week? Yes   7) Have you traveled recently? No   Ruben [name]'s torsemide was decreased on 11/24/20 due to low BP. Since then he has been experiencing swelling in his legs. [name] is wanting to know if his torsemide should be increased or if an order should be placed for him to take an extra dose of the medication if a certain amount of weight is gained from swelling. Please advise.

## 2020-12-06 NOTE — Progress Notes (Signed)
HEART AND [place]                                     Cardiology Office Note:    Date:  12/08/2020   ID:  Ruben [name], DOB [birth year], MRN [id number]  PCP:  Alroy Dust, L.Marlou Sa, [facility] Cardiologist:  Evalina Field, MD / Dr. Burt Knack & Dr Cyndia Bent (TAVR) [place] Electrophysiologist:  None   Referring MD: Alroy Dust, Carlean Jews.Marlou Sa, MD   1 month s/p TAVR  History of Present Illness:    Ruben [name] is a 87 y.o. male with a hx of RBBB, severemitral regurgitation, HTN, COPDon home 02, longstanding persistent atrial fibrillation on warfarin, degenerative arthritis, mechanical fall with hip fracture s/p ORIFand chronic diastolic CHFwith recent admission and severe AS s/p TAVR (11/01/20) who presents to clinic for follow up.   The patient is well known to our structural heart team. He has known severe aortic stenosis and was referred to [place] for TAVR work up. Upon initial consultation he was afunctionally independent and remarkably active for a gentleman forhis age. The [facility] near his son, Ruben [name] (a [employer]).  In12/2021 he was hospitalized with acute hypoxic respiratory failure felt related to a combination of COPD with acute exacerbation, rhinovirus infection, and acute on chronic diastolic congestive heart failure. Symptoms improved with antibiotics, diuresis, and nebulized bronchodilators. Transthoracic echocardiogram performed at that time revealedEF 55%, mean grad 37 mmHg, peak grad 63.4 mmHg, AVA 0.87 cm2, DVI 0.18,as well as severe MAC with mild MS/MR.He was ultimately discharged home on home oxygen therapy.   He was referred to the multidisciplinary heart valve clinic and was evaluated by Dr. Burt Knack. Diagnostic cardiac catheterization 08/24/20 revealed diffuse calcific nonobstructive coronary artery disease with severe aortic stenosis and mild pulmonary  hypertension.There was some question about splay artifact and severe MR and TEE was recommended.TEE2/2/22 confirmed severe AS andrevealed severe mitral regurgitation with a small flail segment of the middle scallop of the posterior leaflet and an eccentric jet of mitral regurgitation with flow reversal in the pulmonary veins. There was severe mitral annular calcification and functional anatomy of the valve did not appear amenable to transcatheter edge-to-edge repair.   The patient has beenextensivelyevaluated by the multidisciplinary valve team and felt to have severe, symptomatic aortic stenosis and to be a suitable candidate for TAVR, which was originallyset up for 09/20/20. On his way to the hospital for his valve surgery on 2/15, the patient suffered a mechanical fall while trying to get into the car that resulted in a hip fracture. He underwent successful ORIF by Dr. Percell Miller on 09/21/20. He was discharged to [facility] for short term rehabilitation. I saw him in the office on 10/12/20 for follow up. He was noted to be in acute heart failure with 20 lb weight gain (up to 212lbs with baseline weight of 190lbs)and making slow progress with hip rehabilitation. Outpatient diuresis was attempted but he was ultimately admitted from 3/10-3/14/22 for IV diuresis, discharge weight 201.7 lbs.He struggled with heart failure and was ultimately readmitted 3/25 for recurrent CHF. He underwent aggressive IV diuresis and TAVR while admitted. He underwent successful TAVR with a 26 mm Edwards Sapien 3 Ultra THV via the TF approach on 11/01/20. Post operative echoshowedEF55%, normally functioning TAVR with a mean gradient of93mHg and noPVL. There is moderate  MR/TR and trivial pericardial effusion. His weight came down from 195 to187 lbs (previous baseline felt to be 190 lbs). He was discharged back to his SNF for continued rehab. Torsemide cute back to 66m daily given soft BPs and significant weight  loss. Given post op bradycardia and underlying RBBB he was discharged with a Zio patch. He also had a painful left heal ulcer that was treated with doxy.  At post hospital follow up, his legs were noted to be more swollen and torsemide was increased from 253mto 4049maily and Kdur from 47m11mo 20me91mater his son called about hypotension and it was decreased to 20mg 37my. Later he called with worsening LE edema and he was told to increase back to 40mg d72m until swelling improved and then change dosing to alternate torsemide 20 mg and kdur 10 meq with torsemide 40 mg and kdur 20 meq qod.  Today he presents to clinic for follow up. Here with son who says he is like a "new person". He no longer sleeps all day, he can breath better, and has color in his hands. He even has been able to come off oxygen at points. His LE edema has improved but only taking torsemide 20mg da81m( not 40/20mg QOD33moes have more swelling in left leg, likely from hip fracture. No orthopnea, PND. No dizziness or syncope.   Past Medical History:  Diagnosis Date  . Arthritis   . CHF (congestive heart failure) (HCC)   . Bertsch-OceanviewD (chronic obstructive pulmonary disease) (HCC)   . [place] regurgitation   . Persistent atrial fibrillation (HCC)    on coumadin   . RBBB   . 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  Cyndia Bentere aortic stenosis     Past Surgical History:  Procedure Laterality Date  . APPENDECTOMY    . BUBBLE STUDY  09/07/2020   Procedure: BUBBLE STUDY;  Surgeon: O'Neal, WGeralynn Rilecation: [facility] ENDOSCFairporte: Cardiovascular;;  . CARDIAC CATHETERIZATION    . CLAVICLE SURGERY    . INTRAMEDULLARY (IM) NAIL INTERTROCHANTERIC Left 09/20/2020   Procedure: INTRAMEDULLARY (IM) NAIL INTERTROCHANTRIC;  Surgeon: Murphy, TRenette Butterscation: [facility] OR;  SNew Alexandriae: Orthopedics;  Laterality: Left;  . RIGHT/LEFT  HEART CATH AND CORONARY ANGIOGRAPHY N/A 08/24/2020   Procedure: RIGHT/LEFT HEART CATH AND CORONARY ANGIOGRAPHY;  Surgeon: Cooper, MSherren Mochacation: [facility] INVASISavannah Service: Cardiovascular;  Laterality: N/A;  . TEE WITHOUT CARDIOVERSION N/A 09/07/2020   Procedure: TRANSESOPHAGEAL ECHOCARDIOGRAM (TEE);  Surgeon: O'Neal, WGeralynn Rilecation: [facility] ENDOSCBrentone: Cardiovascular;  Laterality: N/A;  . TOTAL KNEE ARTHROPLASTY Bilateral   . TRANSCATHETER AORTIC VALVE REPLACEMENT, TRANSFEMORAL N/A 11/01/2020   Procedure: TRANSCATHETER AORTIC VALVE REPLACEMENT, TRANSFEMORAL;  Surgeon: Cooper, MSherren Mochacation: [facility] INVASIHomer Service: Open Heart Surgery;  Laterality: N/A;    Current Medications: Current Meds  Medication Sig  . acetaminophen (TYLENOL) 500 MG tablet Take 1,000 mg by mouth in the morning, at noon, and at bedtime.  . amoxiciMarland Kitchenlin (AMOXIL) 500 MG tablet Take 4 tablets (2,000 mg) one hour prior to all dental visits.  . diclofenac Sodium (VOLTAREN) 1 % GEL Apply 1 application topically 3 (three) times daily as needed (to both knees for arthritis pain).  . dutasteMarland Kitchenide (AVODART) 0.5 MG capsule Take 0.5 mg by mouth in the morning and  at bedtime.  . Ferrous Gluconate 324 (37.5 Fe) MG TABS Take 324 mg by mouth every other day.  Marland Kitchen guaiFENesin (MUCINEX) 600 MG 12 hr tablet Take 1 tablet (600 mg total) by mouth 2 (two) times daily as needed for cough or to loosen phlegm.  Marland Kitchen ipratropium-albuterol (DUONEB) 0.5-2.5 (3) MG/3ML SOLN Take 3 mLs by nebulization in the morning, at noon, and at bedtime.  . levalbuterol (XOPENEX) 0.63 MG/3ML nebulizer solution Take 0.63 mg by nebulization 2 (two) times daily.  . Lidocaine 4 % PTCH Place 1 patch onto the skin in the morning and at bedtime. (0800 & 2000)  . metoprolol succinate (TOPROL-XL) 50 MG 24 hr tablet Take 50 mg by mouth daily.  . montelukast (SINGULAIR) 10 MG tablet Take 10 mg by mouth at bedtime.  . polyethylene glycol  (MIRALAX / GLYCOLAX) 17 g packet Take 17 g by mouth daily as needed for mild constipation.  . polyvinyl alcohol (LIQUIFILM TEARS) 1.4 % ophthalmic solution Place 1 drop into both eyes as needed for dry eyes.  . potassium chloride SA (KLOR-CON) 20 MEQ tablet Take 10 mEq by mouth daily.  Marland Kitchen senna-docusate (SENOKOT-S) 8.6-50 MG tablet Take 2 tablets by mouth daily.  Marland Kitchen SPIRIVA RESPIMAT 2.5 MCG/ACT AERS Inhale 2 puffs into the lungs daily.  . tamsulosin (FLOMAX) 0.4 MG CAPS capsule Take 0.8 mg by mouth at bedtime.  . torsemide (DEMADEX) 20 MG tablet Take 20 mg by mouth daily.  . vitamin B-12 (CYANOCOBALAMIN) 500 MCG tablet Take 500 mcg by mouth daily.  Marland Kitchen warfarin (COUMADIN) 6 MG tablet Take 1 mg by mouth daily.  . [DISCONTINUED] potassium chloride (KLOR-CON) 20 MEQ tablet Take 1 tablet (20 mEq total) by mouth daily. (Patient taking differently: Take 10 mEq by mouth daily.)  . [DISCONTINUED] torsemide 40 MG TABS Take 40 mg by mouth daily.  . [DISCONTINUED] warfarin (COUMADIN) 2.5 MG tablet Take 2.5 mg by mouth daily.     Allergies:   Patient has no known allergies.   Social History   Socioeconomic History  . Marital status: Married    Spouse name: Not on file  . Number of children: Not on file  . Years of education: Not on file  . Highest education level: Not on file  Occupational History  . Occupation: retired  Tobacco Use  . Smoking status: Former Smoker    Packs/day: 1.00    Years: 30.00    Pack years: 30.00    Quit date: 1975    Years since quitting: 47.3  . Smokeless tobacco: Never Used  Vaping Use  . Vaping Use: Never used  Substance and Sexual Activity  . Alcohol use: Not Currently  . Drug use: Never  . Sexual activity: Not on file  Other Topics Concern  . Not on file  Social History Narrative  . Not on file   Social Determinants of Health   Financial Resource Strain: Not on file  Food Insecurity: No Food Insecurity  . Worried About Charity fundraiser in the Last  Year: Never true  . Ran Out of Food in the Last Year: Never true  Transportation Needs: No Transportation Needs  . Lack of Transportation (Medical): No  . Lack of Transportation (Non-Medical): No  Physical Activity: Not on file  Stress: Not on file  Social Connections: Not on file     Family History: The patient's*family history is not on file.  ROS:   Please see the history of present illness.  All other systems reviewed and are negative.  EKGs/Labs/Other Studies Reviewed:    The following studies were reviewed today:   TAVR OPERATIVE NOTE   Date of Procedure:11/01/2020  Preoperative Diagnosis:Severe Aortic Stenosis   Postoperative Diagnosis:Same   Procedure:   Transcatheter Aortic Valve Replacement - Percutaneous Transfemoral Approach Edwards Sapien 3 UltraTHV (size23m, model # [id number] serial ##[id number]  Co-Surgeons:Bryan KAlveria Apley MD and MSherren Mocha MD  Anesthesiologist:Idriss GLissa Hoard MD  Echocardiographer:Peter NJohnsie Cancel MD  Pre-operative Echo Findings: ? Severe aortic stenosis ? Normalleft ventricular systolic function ? 3+ MR  Post-operative Echo Findings: ? Noparavalvular leak ? Normal/unchangedleft ventricular systolic function  ________________________   Echo 11/02/20: IMPRESSIONS  1. Left ventricular ejection fraction, by estimation, is 55 to 60%. The  left ventricle has normal function. The left ventricle has no regional  wall motion abnormalities. Left ventricular diastolic parameters are  indeterminate.  2. Right ventricular systolic function is mildly reduced. The right  ventricular size is mildly enlarged.  3. Left atrial size was mildly dilated.  4. Right atrial size was moderately dilated.  5. The pericardial effusion is posterior and lateral to the left  ventricle.  6. Splay artifact  previous TEE suggests small flail P2 segment with  anteriorly directed MR. The mitral valve is abnormal. Moderate mitral  valve regurgitation. No evidence of mitral stenosis. Severe mitral annular  calcification.  7. Tricuspid valve regurgitation is moderate.  8. Post TAVR with 26 mm Sapien 3 valve no PVL mean gradient 8 peak 14  mmHg AVA 2.3 cm2 DVI 0.44. The aortic valve has been repaired/replaced.  Aortic valve regurgitation is not visualized. No aortic stenosis is  present. There is a 26 mm Sapien prosthetic  (TAVR) valve present in the aortic position. Procedure Date: 11/01/2020.  9. The inferior vena cava is normal in size with greater than 50%  respiratory variability, suggesting right atrial pressure of 3 mmHg.    _____________________  11/04/2020-11/18/2020  Zio AT Study Highlights Enrollment 11/04/2020-11/18/2020 (14 days 0 hours). 1 run of non-sustained Ventricular Tachycardia occurred lasting 8 beats (3.8 seconds) with a max rate of 188 bpm (avg 144 bpm). Atrial Fibrillation occurred continuously (100% burden), ranging from 42-147 bpm (avg of 79 bpm). Isolated VEs were rare (<1.0%, 3716), VE Couplets were rare (<1.0%, 95), and VE Triplets were rare (<1.0%, 1).   Impression: 1. Permanent atrial fibrillation with average heart rate 79 bpm (minimum 42 bpm).  2. No significant bradycardia post TAVR.  3. Rare ectopy.   _____________________  Echo 12/07/20 IMPRESSIONS  1. Left ventricular ejection fraction, by estimation, is 60 to 65%. The left ventricle has normal function. The left ventricle has no regional wall motion abnormalities. There is mild concentric left ventricular hypertrophy. Left ventricular diastolic  function could not be evaluated.  2. Right ventricular systolic function is normal. The right ventricular size is mildly enlarged. There is moderately elevated pulmonary artery systolic pressure. The estimated right ventricular systolic pressure is 401.7mmHg.  3. Left  atrial size was severely dilated.  4. Right atrial size was severely dilated.  5. The pericardial effusion is posterior to the left ventricle.  6. The mitral valve is degenerative. Moderate mitral valve regurgitation. Mild mitral stenosis. The mean mitral valve gradient is 4.0 mmHg with average heart rate of 77 bpm. Severe mitral annular calcification.  7. Tricuspid valve regurgitation is mild to moderate.  8. The aortic valve has been repaired/replaced. Aortic valve regurgitation is not visualized. There is a 26 mm Sapien prosthetic (TAVR) valve  present in the aortic position. Echo findings are consistent with normal structure and function of the aortic  valve prosthesis. Aortic valve mean gradient measures 6.8 mmHg. Aortic valve Vmax measures 1.74 m/s. Aortic valve acceleration time measures 56 msec.  EKG:  EKG is NOT ordered today.   Recent Labs: 07/15/2020: TSH 0.398 10/12/2020: NT-Pro BNP 7,048 10/28/2020: B Natriuretic Peptide 498.5 10/31/2020: ALT 11 11/02/2020: Magnesium 1.6 11/05/2020: Hemoglobin 9.3; Platelets 162 11/10/2020: BUN 21; Creatinine, Ser 0.84; Potassium 3.9; Sodium 138  Recent Lipid Panel No results found for: CHOL, TRIG, HDL, CHOLHDL, VLDL, LDLCALC, LDLDIRECT   Risk Assessment/Calculations:    CHA2DS2-VASc Score = 5  This indicates a 7.2% annual risk of stroke. The patient's score is based upon: CHF History: Yes HTN History: Yes Diabetes History: Yes Stroke History: No Vascular Disease History: No Age Score: 2 Gender Score: 0    Physical Exam:    VS:  BP 126/68   Pulse 62   Ht _0  (1.676 m)   Wt 190 lb 12.8 oz (86.5 kg)   SpO2 99%   BMI 30.80 kg/m     Wt Readings from Last 3 Encounters:  12/07/20 190 lb 12.8 oz (86.5 kg)  11/10/20 187 lb 3.2 oz (84.9 kg)  11/05/20 187 lb 8 oz (85 kg)     GEN:  Well nourished, well developed in no acute distress, in wheelchair HEENT: Normal NECK: No JVD; LYMPHATICS: No lymphadenopathy CARDIAC: irreg irreg, 2/6  holosystolic murmur at apex. No rubs, gallops RESPIRATORY:  Clear to auscultation without rales, wheezing or rhonchi  ABDOMEN: Soft, non-tender, non-distended MUSCULOSKELETAL:  No edema; No deformity  SKIN: Warm and dry. 1+ bilateral LE edema, L>R NEUROLOGIC:  Alert and oriented x 3 PSYCHIATRIC:  Normal affect   ASSESSMENT:    1. S/P TAVR (transcatheter aortic valve replacement)   2. Diastolic congestive heart failure, unspecified HF chronicity ([place])   3. Atrial fibrillation, chronic ([place])   4. Mitral valve insufficiency, unspecified etiology   5. Panlobular emphysema ([place])   6. Ulcer of left foot, unspecified ulcer stage ([place])   7. Type III open fracture of left hip with routine healing, subsequent encounter   8. RBBB    PLAN:    In order of problems listed above:  Severe AS s/p TAVR: echo today shows EF 60%, normally functioning TAVR with a mean gradient of 6.8 mm hg and no PVL. He has NYHA class I symptoms, but is mostly limited by hip pain so not very mobile. However, he has had a huge improvement in symptoms and CHF since TAVR. SBE prophylaxis discussed; I have RX'd amoxicillin. I will see him back at 1 year with echo.   Chronic diastolic CHF: overall appears euvolemic. Weight stable ~190 lbs. We had made several adjustments of torsemide recently for LE edema and soft BPs. He seems to be stable on Torsemide 92m and Kdur 131m for now. He does have mild LE edema L>R likely related to hip injury. I have discussed that it is okay to take an extra torsemide/potassium as needed for weight gain or LE edema.    Chronic afib:rate well controlled today. Continue Coumadin. INR followed by PCP  Moderate to severe MR: echo today showed only moderate MR.   COPD: continued on home inhalers.  Left heel ulcer: this is improving.   Hip fracture s/p ORIF:is now out of rehab and living with his son. Still having a lot of pain which limits his mobility.   [facility]  any HAVB.   Medication Adjustments/Labs and Tests Ordered: Current medicines are reviewed at length with the patient today.  Concerns regarding medicines are outlined above.  No orders of the defined types were placed in this encounter.  No orders of the defined types were placed in this encounter.   Patient Instructions  Medication Instructions:  You may take an extra torsemide 20 mg and KDur 10 meq once daily as needed for swelling/weight gain. *If you need a refill on your cardiac medications before your next appointment, please call your pharmacy*  Follow-Up: At [facility], you and your health needs are our priority.  As part of our continuing mission to provide you with exceptional heart care, we have created designated Provider Care Teams.  These Care Teams include your primary Cardiologist (physician) and Advanced Practice Providers (APPs -  Physician Assistants and Nurse Practitioners) who all work together to provide you with the care you need, when you need it. Your next appointment:   3 month(s) The format for your next appointment:   In Person Provider:   You may see Evalina Field, MD or one of the following Advanced Practice Providers on your designated Care Team:    Almyra Deforest, PA-C  Fabian Sharp, PA-C or   Roby Lofts, PA-C      Signed, Angelena Form, [place]  12/08/2020 3:07 PM    [place]

## 2020-12-07 ENCOUNTER — Ambulatory Visit (HOSPITAL_COMMUNITY): Payer: Medicare Other | Attending: Cardiology

## 2020-12-07 ENCOUNTER — Ambulatory Visit (INDEPENDENT_AMBULATORY_CARE_PROVIDER_SITE_OTHER): Payer: Medicare Other | Admitting: Physician Assistant

## 2020-12-07 ENCOUNTER — Encounter: Payer: Self-pay | Admitting: Physician Assistant

## 2020-12-07 ENCOUNTER — Other Ambulatory Visit: Payer: Self-pay

## 2020-12-07 VITALS — BP 126/68 | HR 62 | Ht 66.0 in | Wt 190.8 lb

## 2020-12-07 DIAGNOSIS — Z952 Presence of prosthetic heart valve: Secondary | ICD-10-CM | POA: Diagnosis not present

## 2020-12-07 DIAGNOSIS — L97529 Non-pressure chronic ulcer of other part of left foot with unspecified severity: Secondary | ICD-10-CM

## 2020-12-07 DIAGNOSIS — I482 Chronic atrial fibrillation, unspecified: Secondary | ICD-10-CM | POA: Diagnosis not present

## 2020-12-07 DIAGNOSIS — I5030 Unspecified diastolic (congestive) heart failure: Secondary | ICD-10-CM

## 2020-12-07 DIAGNOSIS — I340 Nonrheumatic mitral (valve) insufficiency: Secondary | ICD-10-CM | POA: Diagnosis not present

## 2020-12-07 DIAGNOSIS — I4510 Unspecified right bundle-branch block: Secondary | ICD-10-CM

## 2020-12-07 DIAGNOSIS — S72002F Fracture of unspecified part of neck of left femur, subsequent encounter for open fracture type IIIA, IIIB, or IIIC with routine healing: Secondary | ICD-10-CM

## 2020-12-07 DIAGNOSIS — J431 Panlobular emphysema: Secondary | ICD-10-CM

## 2020-12-07 LAB — ECHOCARDIOGRAM COMPLETE
AV Mean grad: 6.8 mmHg
AV Peak grad: 12.1 mmHg
Ao pk vel: 1.74 m/s
Area-P 1/2: 1.74 cm2
S' Lateral: 2.8 cm

## 2020-12-07 NOTE — Patient Instructions (Signed)
Medication Instructions:  You may take an extra torsemide 20 mg and KDur 10 meq once daily as needed for swelling/weight gain. *If you need a refill on your cardiac medications before your next appointment, please call your pharmacy*  Follow-Up: At [facility], you and your health needs are our priority.  As part of our continuing mission to provide you with exceptional heart care, we have created designated Provider Care Teams.  These Care Teams include your primary Cardiologist (physician) and Advanced Practice Providers (APPs -  Physician Assistants and Nurse Practitioners) who all work together to provide you with the care you need, when you need it. Your next appointment:   3 month(s) The format for your next appointment:   In Person Provider:   You may see Reatha Harps, MD or one of the following Advanced Practice Providers on your designated Care Team:    Azalee Course, PA-C  Micah Flesher, PA-C or   Judy Pimple, [place]

## 2020-12-12 ENCOUNTER — Other Ambulatory Visit (HOSPITAL_COMMUNITY): Payer: Self-pay

## 2020-12-12 MED ORDER — TRAMADOL HCL 50 MG PO TABS
50.0000 mg | ORAL_TABLET | Freq: Two times a day (BID) | ORAL | 0 refills | Status: DC | PRN
  Filled 2020-12-12: qty 30, 15d supply, fill #0

## 2021-01-20 ENCOUNTER — Ambulatory Visit (INDEPENDENT_AMBULATORY_CARE_PROVIDER_SITE_OTHER): Payer: Medicare Other | Admitting: Podiatry

## 2021-01-20 ENCOUNTER — Other Ambulatory Visit: Payer: Self-pay

## 2021-01-20 ENCOUNTER — Encounter: Payer: Self-pay | Admitting: Podiatry

## 2021-01-20 DIAGNOSIS — B351 Tinea unguium: Secondary | ICD-10-CM | POA: Diagnosis not present

## 2021-01-20 DIAGNOSIS — M79674 Pain in right toe(s): Secondary | ICD-10-CM | POA: Diagnosis not present

## 2021-01-20 DIAGNOSIS — M79675 Pain in left toe(s): Secondary | ICD-10-CM | POA: Diagnosis not present

## 2021-01-23 NOTE — Progress Notes (Signed)
Subjective:   Patient ID: Ruben [name], male   DOB: 87 y.o.   MRN: [id number]   HPI Patient presents with chronic severe nail disease 1-5 both feet that is impossible for him to take care of due to advanced age and that they have not been taken care of for a while and has grown increasingly thick   Review of Systems  All other systems reviewed and are negative.      Objective:  Physical Exam Vitals and nursing note reviewed.  Constitutional:      Appearance: He is well-developed.  Pulmonary:     Effort: Pulmonary effort is normal.  Musculoskeletal:        General: Normal range of motion.  Skin:    General: Skin is warm.  Neurological:     Mental Status: He is alert.    Neurovascular status found to be moderately diminished bilateral both sharp dull vibratory PT DP pulses with range of motion subtalar midtarsal joint mildly reduced and muscle strength reduced.  Patient is found to have thick deformed yellow nailbeds 1-5 both feet impossible to cut and become painful with pressure especially shoe gear.  Good digital perfusion well oriented F2     Assessment:  Chronic ingrown toenail thick yellow brittle mycotic nailbeds 1-5 both feet     Plan:  Debridement of nailbeds 1-5 both feet foot education given reappoint to recheck

## 2021-03-13 ENCOUNTER — Ambulatory Visit: Payer: 59 | Admitting: Cardiovascular Disease

## 2021-03-14 ENCOUNTER — Encounter: Payer: Self-pay | Admitting: Cardiovascular Disease

## 2021-03-14 ENCOUNTER — Ambulatory Visit (INDEPENDENT_AMBULATORY_CARE_PROVIDER_SITE_OTHER): Payer: Medicare Other | Admitting: Cardiovascular Disease

## 2021-03-14 ENCOUNTER — Other Ambulatory Visit: Payer: Self-pay

## 2021-03-14 VITALS — BP 132/70 | HR 60 | Ht 66.0 in | Wt 191.2 lb

## 2021-03-14 DIAGNOSIS — I5030 Unspecified diastolic (congestive) heart failure: Secondary | ICD-10-CM

## 2021-03-14 DIAGNOSIS — Z952 Presence of prosthetic heart valve: Secondary | ICD-10-CM

## 2021-03-14 DIAGNOSIS — I482 Chronic atrial fibrillation, unspecified: Secondary | ICD-10-CM | POA: Diagnosis not present

## 2021-03-14 DIAGNOSIS — I340 Nonrheumatic mitral (valve) insufficiency: Secondary | ICD-10-CM

## 2021-03-14 MED ORDER — APIXABAN 5 MG PO TABS: 5.0000 mg | ORAL_TABLET | Freq: Two times a day (BID) | ORAL | 3 refills | Status: DC

## 2021-03-14 NOTE — Patient Instructions (Signed)
Medication Instructions:  Take Torsemide as needed Take Potassium as needed STOP Coumadin  START Eliquis 5 mg twice daily (start tomorrow) *If you need a refill on your cardiac medications before your next appointment, please call your pharmacy*   Follow-Up: At [facility], you and your health needs are our priority.  As part of our continuing mission to provide you with exceptional heart care, we have created designated Provider Care Teams.  These Care Teams include your primary Cardiologist (physician) and Advanced Practice Providers (APPs -  Physician Assistants and Nurse Practitioners) who all work together to provide you with the care you need, when you need it.  We recommend signing up for the patient portal called "MyChart".  Sign up information is provided on this After Visit Summary.  MyChart is used to connect with patients for Virtual Visits (Telemedicine).  Patients are able to view lab/test results, encounter notes, upcoming appointments, etc.  Non-urgent messages can be sent to your provider as well.   To learn more about what you can do with MyChart, go to [email or web address].    Your next appointment:   12 month(s)  The format for your next appointment:   In Person  Provider:   You may see Reatha Harps, MD or one of the following Advanced Practice Providers on your designated Care Team:   Azalee Course, PA-C Micah Flesher, PA-C or  Judy Pimple, [place]

## 2021-03-14 NOTE — Progress Notes (Signed)
Cardiology Office Note:   Date:  03/14/2021  NAME:  Ruben [name]    MRN: [id number] DOB:  Jan 23, 1927   PCP:  Alroy Dust, L.Marlou Sa, MD  Cardiologist:  Evalina Field, MD  Electrophysiologist:  None   Referring MD: Aurea Graff.Marlou Sa, MD   Chief Complaint  Patient presents with   Follow-up         History of Present Illness:   Ruben [name] is a 87 y.o. male with a hx of severe aortic stenosis status post TAVR, moderate-severe MR, persistent atrial fibrillation on Coumadin, COPD on home 2 L, pulm hypertension, diastolic heart failure presents for follow-up.  He presents with his son.  Doing well.  Oxygen saturation 92% on no oxygen.  They monitor at home.  Levels do not go below this.  He reports no chest pain.  He is a bit frail but no issues.  He reports he feels better since his TAVR.  He is permanently in atrial fibrillation.  He is on Coumadin.  We discussed switching to Eliquis.  He is okay to do this.  His BP is 132/70.  He understands he needs SBE prophylaxis for any dental work.  He still has a murmur of mitral regurgitation but this cannot be intervened upon.  He overall seems to be doing well.  Denies any chest pain or trouble breathing.  He is taking torsemide 20 mg daily.  Can likely back off on this to every other day.  Overall doing well since TAVR.  Problem List COPD Persistent Afib Severe AS s/p TAVR -11/01/2020 26 mm S3 4. 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 HFpEF 6. 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    Severe aortic stenosis     Past Surgical History: Past  Surgical History:  Procedure Laterality Date   APPENDECTOMY     BUBBLE STUDY  09/07/2020   Procedure: BUBBLE STUDY;  Surgeon: Geralynn Rile, MD;  Location: [place];  Service: Cardiovascular;;   CARDIAC CATHETERIZATION     CLAVICLE SURGERY     INTRAMEDULLARY (IM) NAIL INTERTROCHANTERIC Left 09/20/2020   Procedure: INTRAMEDULLARY (IM) NAIL INTERTROCHANTRIC;  Surgeon: Renette Butters, MD;  Location: [place];  Service: Orthopedics;  Laterality: Left;   RIGHT/LEFT HEART CATH AND CORONARY ANGIOGRAPHY N/A 08/24/2020   Procedure: RIGHT/LEFT HEART CATH AND CORONARY ANGIOGRAPHY;  Surgeon: Sherren Mocha, MD;  Location: [facility];  Service: Cardiovascular;  Laterality: N/A;   TEE WITHOUT CARDIOVERSION N/A 09/07/2020   Procedure: TRANSESOPHAGEAL ECHOCARDIOGRAM (TEE);  Surgeon: Geralynn Rile, MD;  Location: [place];  Service: Cardiovascular;  Laterality: N/A;   TOTAL KNEE ARTHROPLASTY Bilateral    TRANSCATHETER AORTIC VALVE REPLACEMENT, TRANSFEMORAL N/A 11/01/2020   Procedure: TRANSCATHETER AORTIC VALVE REPLACEMENT, TRANSFEMORAL;  Surgeon: Sherren Mocha, MD;  Location: [facility];  Service: Open Heart Surgery;  Laterality: N/A;    Current Medications: Current Meds  Medication Sig   apixaban (ELIQUIS) 5 MG TABS tablet Take 1 tablet (5 mg total) by mouth 2 (two) times daily.  diclofenac Sodium (VOLTAREN) 1 % GEL Apply 1 application topically 3 (three) times daily as needed (to both knees for arthritis pain).   dutasteride (AVODART) 0.5 MG capsule Take 0.5 mg by mouth in the morning and at bedtime.   guaiFENesin (MUCINEX) 600 MG 12 hr tablet Take 1 tablet (600 mg total) by mouth 2 (two) times daily as needed for cough or to loosen phlegm.   KLOR-CON M10 10 MEQ tablet Take 10 mEq by mouth daily.   levalbuterol (XOPENEX HFA) 45 MCG/ACT inhaler SMARTSIG:1 Puff(s) Via Inhaler Every 4 Hours PRN   Lidocaine 4 % PTCH Place 1 patch onto the skin in the morning and at  bedtime. (0800 & 2000)   metoprolol succinate (TOPROL-XL) 50 MG 24 hr tablet Take 50 mg by mouth daily.   montelukast (SINGULAIR) 10 MG tablet Take 10 mg by mouth at bedtime.   polyethylene glycol (MIRALAX / GLYCOLAX) 17 g packet Take 17 g by mouth daily as needed for mild constipation.   polyvinyl alcohol (LIQUIFILM TEARS) 1.4 % ophthalmic solution Place 1 drop into both eyes as needed for dry eyes.   senna-docusate (SENOKOT-S) 8.6-50 MG tablet Take 2 tablets by mouth daily.   SPIRIVA RESPIMAT 2.5 MCG/ACT AERS Inhale 2 puffs into the lungs daily.   tamsulosin (FLOMAX) 0.4 MG CAPS capsule Take 0.8 mg by mouth at bedtime.   torsemide (DEMADEX) 20 MG tablet Take 20 mg by mouth as needed.   traMADol (ULTRAM) 50 MG tablet Take 1 tablet (50 mg total) by mouth 2 (two) times daily as needed  for pain.   vitamin B-12 (CYANOCOBALAMIN) 500 MCG tablet Take 500 mcg by mouth daily.   [DISCONTINUED] potassium chloride SA (KLOR-CON) 20 MEQ tablet Take 10 mEq by mouth as needed.   [DISCONTINUED] warfarin (COUMADIN) 6 MG tablet Take 1 mg by mouth daily.     Allergies:    Patient has no known allergies.   Social History: Social History   Socioeconomic History   Marital status: Married    Spouse name: Not on file   Number of children: Not on file   Years of education: Not on file   Highest education level: Not on file  Occupational History   Occupation: retired  Tobacco Use   Smoking status: Former    Packs/day: 1.00    Years: 30.00    Pack years: 30.00    Types: Cigarettes    Quit date: 1975    Years since quitting: 47.6   Smokeless tobacco: Never  Vaping Use   Vaping Use: Never used  Substance and Sexual Activity   Alcohol use: Not Currently   Drug use: Never   Sexual activity: Not on file  Other Topics Concern   Not on file  Social History Narrative   Not on file   Social Determinants of Health   Financial Resource Strain: Not on file  Food Insecurity: No Food Insecurity    Worried About Charity fundraiser in the Last Year: Never true   [place] in the Last Year: Never true  Transportation Needs: No Transportation Needs   Lack of Transportation (Medical): No   Lack of Transportation (Non-Medical): No  Physical Activity: Not on file  Stress: Not on file  Social Connections: Not on file     Family History: The patient's family history is not on file.  ROS:   All other ROS reviewed and negative. Pertinent positives noted in the HPI.     EKGs/Labs/Other  Studies Reviewed:   The following studies were personally reviewed by me today:  TTE 12/07/2020  1. Left ventricular ejection fraction, by estimation, is 60 to 65%. The  left ventricle has normal function. The left ventricle has no regional  wall motion abnormalities. There is mild concentric left ventricular  hypertrophy. Left ventricular diastolic  function could not be evaluated.   2. Right ventricular systolic function is normal. The right ventricular  size is mildly enlarged. There is moderately elevated pulmonary artery  systolic pressure. The estimated right ventricular systolic pressure is  91.4 mmHg.   3. Left atrial size was severely dilated.   4. Right atrial size was severely dilated.   5. The pericardial effusion is posterior to the left ventricle.   6. The mitral valve is degenerative. Moderate mitral valve regurgitation.  Mild mitral stenosis. The mean mitral valve gradient is 4.0 mmHg with  average heart rate of 77 bpm. Severe mitral annular calcification.   7. Tricuspid valve regurgitation is mild to moderate.   8. The aortic valve has been repaired/replaced. Aortic valve  regurgitation is not visualized. There is a 26 mm Sapien prosthetic (TAVR)  valve present in the aortic position. Echo findings are consistent with  normal structure and function of the aortic  valve prosthesis. Aortic valve mean gradient measures 6.8 mmHg. Aortic  valve Vmax measures 1.74 m/s. Aortic valve  acceleration time measures 56  msec.   Recent Labs: 07/15/2020: TSH 0.398 10/12/2020: NT-Pro BNP 7,048 10/28/2020: B Natriuretic Peptide 498.5 10/31/2020: ALT 11 11/02/2020: Magnesium 1.6 11/05/2020: Hemoglobin 9.3; Platelets 162 11/10/2020: BUN 21; Creatinine, Ser 0.84; Potassium 3.9; Sodium 138   Recent Lipid Panel No results found for: CHOL, TRIG, HDL, CHOLHDL, VLDL, LDLCALC, LDLDIRECT  Physical Exam:   VS:  BP 132/70 (BP Location: Left Arm, Patient Position: Sitting, Cuff Size: Normal)   Pulse 60   Ht _0  (1.676 m)   Wt 191 lb 3.2 oz (86.7 kg)   SpO2 92%   BMI 30.86 kg/m    Wt Readings from Last 3 Encounters:  03/14/21 191 lb 3.2 oz (86.7 kg)  12/07/20 190 lb 12.8 oz (86.5 kg)  11/10/20 187 lb 3.2 oz (84.9 kg)    General: Well nourished, well developed, in no acute distress Head: Atraumatic, normal size  Eyes: PEERLA, EOMI  Neck: Supple, no JVD Endocrine: No thryomegaly Cardiac: Normal S1, S2; irregular rhythm, 3 out of 6 holosystolic murmur Lungs: Clear to auscultation bilaterally, no wheezing, rhonchi or rales  Abd: Soft, nontender, no hepatomegaly  Ext: No edema, pulses 2+ Musculoskeletal: No deformities, BUE and BLE strength normal and equal Skin: Warm and dry, no rashes   Neuro: Alert and oriented to person, place, time, and situation, CNII-XII grossly intact, no focal deficits  Psych: Normal mood and affect   ASSESSMENT:   Ruben [name] is a 87 y.o. male who presents for the following: 1. S/P TAVR (transcatheter aortic valve replacement)   2. Mitral valve insufficiency, unspecified etiology   3. Diastolic congestive heart failure, unspecified HF chronicity ([place])   4. Atrial fibrillation, chronic (HCC)     PLAN:   1. S/P TAVR (transcatheter aortic valve replacement) -Underwent 26 mm sapient 3 TAVR on 11/01/2020.  Doing well since that time.  Not on aspirin as he is on Coumadin.  We are switching to Eliquis.  We will hold on aspirin for now. -Valve functioning  well.  He will continue with SBE prophylaxis as needed.  2. Mitral valve insufficiency,  unspecified etiology -Small flail P2 segment.  Moderate severe mitral vegetation.  He has extensive MAC.  Not a candidate for mitral valve clipping.  We will continue with conservative approach. -Suspect the regurgitant volume is lessened by treatment of his severe aortic stenosis.  LVEDP is noticeably lower after TAVR.  3. Diastolic congestive heart failure, unspecified HF chronicity (HCC) -Euvolemic on exam.  Reduce torsemide to 20 mg daily as needed.  He will take potassium as needed as well.  4. Atrial fibrillation, chronic (HCC) -Rate control.  On Coumadin.  We discussed switching to Eliquis.  We will switch to 5 mg twice daily.  Kidney function and his weight qualify for 5 mg twice daily.  Disposition: Return in about 1 year (around 03/14/2022).  Medication Adjustments/Labs and Tests Ordered: Current medicines are reviewed at length with the patient today.  Concerns regarding medicines are outlined above.  No orders of the defined types were placed in this encounter.  Meds ordered this encounter  Medications   apixaban (ELIQUIS) 5 MG TABS tablet    Sig: Take 1 tablet (5 mg total) by mouth 2 (two) times daily.    Dispense:  60 tablet    Refill:  3     Patient Instructions  Medication Instructions:  Take Torsemide as needed Take Potassium as needed STOP Coumadin  START Eliquis 5 mg twice daily (start tomorrow) *If you need a refill on your cardiac medications before your next appointment, please call your pharmacy*   Follow-Up: At [facility], you and your health needs are our priority.  As part of our continuing mission to provide you with exceptional heart care, we have created designated Provider Care Teams.  These Care Teams include your primary Cardiologist (physician) and Advanced Practice Providers (APPs -  Physician Assistants and Nurse Practitioners) who all work together to  provide you with the care you need, when you need it.  We recommend signing up for the patient portal called "MyChart".  Sign up information is provided on this After Visit Summary.  MyChart is used to connect with patients for Virtual Visits (Telemedicine).  Patients are able to view lab/test results, encounter notes, upcoming appointments, etc.  Non-urgent messages can be sent to your provider as well.   To learn more about what you can do with MyChart, go to [email or web address].    Your next appointment:   12 month(s)  The format for your next appointment:   In Person  Provider:   You may see Evalina Field, MD or one of the following Advanced Practice Providers on your designated Care Team:   Almyra Deforest, PA-C Fabian Sharp, [place] or  Roby Lofts, PA-C     Time Spent with Patient: I have spent a total of 35 minutes with patient reviewing hospital notes, telemetry, EKGs, labs and examining the patient as well as establishing an assessment and plan that was discussed with the patient.  > 50% of time was spent in direct patient care.  Signed, Addison Naegeli. Audie Box, MD, [place]  [address] [place], [place] [address] [phone]  03/14/2021 9:41 PM

## 2021-03-22 ENCOUNTER — Telehealth: Payer: Self-pay | Admitting: Cardiovascular Disease

## 2021-03-22 MED ORDER — TORSEMIDE 20 MG PO TABS: 20.0000 mg | ORAL_TABLET | ORAL | 2 refills | Status: DC | PRN

## 2021-03-22 MED ORDER — KLOR-CON M10 10 MEQ PO TBCR: 10.0000 meq | EXTENDED_RELEASE_TABLET | Freq: Every day | ORAL | 3 refills | Status: DC

## 2021-03-22 NOTE — Telephone Encounter (Signed)
  *  STAT* If patient is at the pharmacy, call can be transferred to refill team.   1. Which medications need to be refilled? (please list name of each medication and dose if known)   KLOR-CON M10 10 MEQ tablet    torsemide (DEMADEX) 20 MG tablet   2. Which pharmacy/location (including street and city if local pharmacy) is medication to be sent to? [id number] - [place],  - [address]  3. Do they need a 30 day or 90 day supply? 90 days

## 2021-03-22 NOTE — Telephone Encounter (Signed)
Rx(s) sent to pharmacy electronically.  

## 2021-03-27 ENCOUNTER — Telehealth: Payer: Self-pay | Admitting: Cardiovascular Disease

## 2021-03-27 NOTE — Telephone Encounter (Signed)
   Pt's son called, he said that pt will have his eyebrow pluck but they are considering to get it lasered, they would like to know if pt needs to pre med with antibiotics for that that procedure

## 2021-03-28 NOTE — Telephone Encounter (Signed)
Called patient son back- he states that his dad is having his eyebrows lasered, I advised that he did not need his antibiotics for this, son verbalized understanding.  Thankful for call back.   Will route to MD as FYI. Thanks!

## 2021-04-24 ENCOUNTER — Other Ambulatory Visit: Payer: Self-pay

## 2021-04-24 ENCOUNTER — Ambulatory Visit: Payer: Medicare Other | Attending: Internal Medicine

## 2021-04-24 ENCOUNTER — Other Ambulatory Visit (HOSPITAL_BASED_OUTPATIENT_CLINIC_OR_DEPARTMENT_OTHER): Payer: Self-pay

## 2021-04-24 DIAGNOSIS — Z23 Encounter for immunization: Secondary | ICD-10-CM

## 2021-04-24 MED ORDER — PFIZER COVID-19 VAC BIVALENT 30 MCG/0.3ML IM SUSP
INTRAMUSCULAR | 0 refills | Status: DC
  Filled 2021-04-24: qty 0.3, 1d supply, fill #0

## 2021-04-24 NOTE — Progress Notes (Signed)
   Covid-19 Vaccination Clinic  Name:  NERY FRAPPIER    MRN: [id number] DOB: 1927-03-06  04/24/2021  Mr. Nakama was observed post Covid-19 immunization for 15 minutes without incident. He was provided with Vaccine Information Sheet and instruction to access the V-Safe system.   Mr. Westry was instructed to call 911 with any severe reactions post vaccine: Difficulty breathing  Swelling of face and throat  A fast heartbeat  A bad rash all over body  Dizziness and weakness

## 2021-04-28 ENCOUNTER — Ambulatory Visit: Payer: Medicare Other | Admitting: Podiatry

## 2021-05-02 ENCOUNTER — Ambulatory Visit (INDEPENDENT_AMBULATORY_CARE_PROVIDER_SITE_OTHER): Payer: Medicare Other | Admitting: Podiatry

## 2021-05-02 ENCOUNTER — Other Ambulatory Visit: Payer: Self-pay

## 2021-05-02 ENCOUNTER — Encounter: Payer: Self-pay | Admitting: Podiatry

## 2021-05-02 DIAGNOSIS — M79675 Pain in left toe(s): Secondary | ICD-10-CM | POA: Diagnosis not present

## 2021-05-02 DIAGNOSIS — B351 Tinea unguium: Secondary | ICD-10-CM | POA: Diagnosis not present

## 2021-05-02 DIAGNOSIS — M79674 Pain in right toe(s): Secondary | ICD-10-CM | POA: Diagnosis not present

## 2021-05-02 NOTE — Progress Notes (Signed)
This patient returns to the office for evaluation and treatment of long thick painful nails .  This patient is unable to trim his own nails since the patient cannot reach his feet.  Patient says the nails are painful walking and wearing his shoes.  He returns for preventive foot care services.  General Appearance  Alert, conversant and in no acute stress.  Vascular  Dorsalis pedis and posterior tibial  pulses are weakly  palpable  bilaterally.  Capillary return is within normal limits  bilaterally. Temperature is within normal limits  bilaterally.  Neurologic  Senn-Weinstein monofilament wire test within normal limits  bilaterally. Muscle power within normal limits bilaterally.  Nails Thick disfigured discolored nails with subungual debris  from hallux to fifth toes bilaterally. No evidence of bacterial infection or drainage bilaterally.  Orthopedic  No limitations of motion  feet .  No crepitus or effusions noted.  No bony pathology or digital deformities noted.  Skin  normotropic skin with no porokeratosis noted bilaterally.  No signs of infections or ulcers noted.     Onychomycosis  Pain in toes right foot  Pain in toes left foot  Debridement  of nails  1-5  B/L with a nail nipper.  Nails were then filed using a dremel tool with no incidents.    RTC 3 months   Raynetta Osterloh DPM  

## 2021-07-05 ENCOUNTER — Other Ambulatory Visit: Payer: Self-pay | Admitting: Cardiovascular Disease

## 2021-07-05 NOTE — Telephone Encounter (Signed)
Prescription refill request for Eliquis received. Indication: Afib  Last office visit: 03/14/21 ([place])  Scr: 0.84 (11/10/20) Age: 87 Weight: 86.7kg  Appropriate dose and refill sent to requested pharmacy.

## 2021-08-04 ENCOUNTER — Ambulatory Visit: Payer: Medicare Other | Admitting: Podiatry

## 2021-08-15 ENCOUNTER — Other Ambulatory Visit: Payer: Self-pay | Admitting: Physician Assistant

## 2021-08-15 DIAGNOSIS — Z952 Presence of prosthetic heart valve: Secondary | ICD-10-CM

## 2021-10-04 ENCOUNTER — Other Ambulatory Visit (HOSPITAL_COMMUNITY): Payer: 59

## 2021-10-10 NOTE — Progress Notes (Signed)
[facility]                                     Cardiology Office Note:    Date:  10/12/2021   ID:  Ruben [name], DOB 1926/10/23, MRN [id number]  PCP:  Alroy Dust, L.Marlou Sa, [facility] Cardiologist:  Evalina Field, MD  / Dr. Burt Knack & Dr Cyndia Bent (TAVR) [place] Electrophysiologist:  None   Referring MD: Alroy Dust, Carlean Jews.Marlou Sa, MD   1 year s/p TAVR  History of Present Illness:    Ruben [name] is a 88 y.o. male with a hx of RBBB, severe mitral regurgitation, HTN, COPD on home 02, longstanding persistent atrial fibrillation on Eliquis, degenerative arthritis, mechanical fall with hip fracture s/p ORIF and chronic diastolic CHF and severe AS s/p TAVR (11/01/20) who presents to clinic for follow up.   The patient is well known to our structural heart team. He has known severe aortic stenosis and was referred to [place] for TAVR work up. Upon initial consultation he was a functionally independent and remarkably active for a gentleman for his age. The patient recently relocated to [place] to live near his son, Ruben [name] (a [employer]).  In 07/2020 he was hospitalized with acute hypoxic respiratory failure felt related to a combination of COPD with acute exacerbation, rhinovirus infection, and acute on chronic diastolic congestive heart failure. Symptoms improved with antibiotics, diuresis, and nebulized bronchodilators. Transthoracic echocardiogram performed at that time revealed EF 55%, mean grad 37 mmHg, peak grad 63.4 mmHg, AVA 0.87 cm2, DVI 0.18, as well as severe MAC with mild MS/MR. He was ultimately discharged home on home oxygen therapy.   He was referred to the multidisciplinary heart valve clinic and was evaluated by Dr. Burt Knack. Diagnostic cardiac catheterization 08/24/20 revealed diffuse calcific nonobstructive coronary artery disease with severe aortic stenosis and mild pulmonary hypertension. There was some question  about splay artifact and severe MR and TEE was recommended. TEE 09/07/20 confirmed severe AS and revealed severe mitral regurgitation with a small flail segment of the middle scallop of the posterior leaflet and an eccentric jet of mitral regurgitation with flow reversal in the pulmonary veins. There was severe mitral annular calcification and functional anatomy of the valve did not appear amenable to transcatheter edge-to-edge repair.   The patient has been extensively evaluated by the multidisciplinary valve team and felt to have severe, symptomatic aortic stenosis and to be a suitable candidate for TAVR, which was originally set up for 09/20/20. On his way to the hospital for his valve surgery on 2/15, the patient suffered a mechanical fall while trying to get into the car that resulted in a hip fracture. He underwent successful ORIF by Dr. Percell Miller on 09/21/20. He was discharged to [facility] for short term rehabilitation.   He had issues with recurrent heart failure after this and ultimately underwent successful TAVR with a 26 mm Edwards Sapien 3 Ultra THV via the TF approach on 11/01/20. Post operative echo showed EF 55%, normally functioning TAVR with a mean gradient of 8 mmHg and no PVL. There is moderate MR/TR and trivial pericardial effusion. His weight came down from 195 to 187 lbs (previous baseline felt to be 190 lbs). He was discharged back to his SNF for continued rehab and then home. He had a marked improvement since TAVR. 1 month  echo showed EF 60%, normally functioning TAVR with a mean gradient of 6.8 mm hg and no PVL as well as moderate MR.   He was seen by [name] in 03/2021 and Coumadin switched to Eliquis and torsemide decreased to PRN.   Today he presents to clinic for follow up. He is here with his son. He is doing great. No CP or SOB. No LE edema, orthopnea or PND. No dizziness or syncope. No blood in stool or urine. No palpitations. He has been taking torsemide/kdur  everyday.   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    Severe aortic stenosis     Past Surgical History:  Procedure Laterality Date   APPENDECTOMY     BUBBLE STUDY  09/07/2020   Procedure: BUBBLE STUDY;  Surgeon: Geralynn Rile, MD;  Location: [facility];  Service: Cardiovascular;;   CARDIAC CATHETERIZATION     CLAVICLE SURGERY     INTRAMEDULLARY (IM) NAIL INTERTROCHANTERIC Left 09/20/2020   Procedure: INTRAMEDULLARY (IM) NAIL INTERTROCHANTRIC;  Surgeon: Renette Butters, MD;  Location: [facility];  Service: Orthopedics;  Laterality: Left;   RIGHT/LEFT HEART CATH AND CORONARY ANGIOGRAPHY N/A 08/24/2020   Procedure: RIGHT/LEFT HEART CATH AND CORONARY ANGIOGRAPHY;  Surgeon: Sherren Mocha, MD;  Location: [facility];  Service: Cardiovascular;  Laterality: N/A;   TEE WITHOUT CARDIOVERSION N/A 09/07/2020   Procedure: TRANSESOPHAGEAL ECHOCARDIOGRAM (TEE);  Surgeon: Geralynn Rile, MD;  Location: [facility];  Service: Cardiovascular;  Laterality: N/A;   TOTAL KNEE ARTHROPLASTY Bilateral    TRANSCATHETER AORTIC VALVE REPLACEMENT, TRANSFEMORAL N/A 11/01/2020   Procedure: TRANSCATHETER AORTIC VALVE REPLACEMENT, TRANSFEMORAL;  Surgeon: Sherren Mocha, MD;  Location: [facility];  Service: Open Heart Surgery;  Laterality: N/A;    Current Medications: Current Meds  Medication Sig   acetaminophen (TYLENOL) 500 MG tablet Take 1,000 mg by mouth in the morning, at noon, and at bedtime.   amoxicillin (AMOXIL) 500 MG tablet Take 4 tablets (2,000 mg) one hour prior to all dental visits.   COVID-19 mRNA bivalent vaccine, Pfizer, (PFIZER  COVID-19 VAC BIVALENT) injection Inject into the muscle.   diclofenac Sodium (VOLTAREN) 1 % GEL Apply 1 application topically 3 (three) times daily as needed (to both knees for arthritis pain).   dutasteride (AVODART) 0.5 MG capsule Take 0.5 mg by mouth in the morning and at bedtime.   ELIQUIS 5 MG TABS tablet TAKE 1 TABLET BY MOUTH TWICE A DAY   guaiFENesin (MUCINEX) 600 MG 12 hr tablet Take 1 tablet (600 mg total) by mouth 2 (two) times daily as needed for cough or to loosen phlegm.   ipratropium-albuterol (DUONEB) 0.5-2.5 (3) MG/3ML SOLN Take 3 mLs by nebulization in the morning, at noon, and at bedtime.   KLOR-CON M10 10 MEQ tablet Take 1 tablet (10 mEq total) by mouth daily.   levalbuterol (XOPENEX HFA) 45 MCG/ACT inhaler SMARTSIG:1 Puff(s) Via Inhaler Every 4 Hours PRN   levalbuterol (XOPENEX) 0.63 MG/3ML nebulizer solution Take 0.63 mg by nebulization 2 (two) times daily.   Lidocaine 4 % PTCH Place 1 patch onto the  skin in the morning and at bedtime. (0800 & 2000)   metoprolol succinate (TOPROL-XL) 50 MG 24 hr tablet Take 50 mg by mouth daily.   montelukast (SINGULAIR) 10 MG tablet Take 10 mg by mouth at bedtime.   polyethylene glycol (MIRALAX / GLYCOLAX) 17 g packet Take 17 g by mouth daily as needed for mild constipation.   polyvinyl alcohol (LIQUIFILM TEARS) 1.4 % ophthalmic solution Place 1 drop into both eyes as needed for dry eyes.   senna-docusate (SENOKOT-S) 8.6-50 MG tablet Take 2 tablets by mouth daily.   SPIRIVA RESPIMAT 2.5 MCG/ACT AERS Inhale 2 puffs into the lungs daily.   tamsulosin (FLOMAX) 0.4 MG CAPS capsule Take 0.8 mg by mouth at bedtime.   torsemide (DEMADEX) 20 MG tablet Take 20 mg by mouth daily.   traMADol (ULTRAM) 50 MG tablet Take 1 tablet (50 mg total) by mouth 2 (two) times daily as needed  for pain.   [DISCONTINUED] torsemide (DEMADEX) 20 MG tablet Take 1 tablet (20 mg total) by mouth as needed.     Allergies:   Patient has no known allergies.   Social  History   Socioeconomic History   Marital status: Married    Spouse name: Not on file   Number of children: Not on file   Years of education: Not on file   Highest education level: Not on file  Occupational History   Occupation: retired  Tobacco Use   Smoking status: Former    Packs/day: 1.00    Years: 30.00    Pack years: 30.00    Types: Cigarettes    Quit date: 1975    Years since quitting: 48.2   Smokeless tobacco: Never  Vaping Use   Vaping Use: Never used  Substance and Sexual Activity   Alcohol use: Not Currently   Drug use: Never   Sexual activity: Not on file  Other Topics Concern   Not on file  Social History Narrative   Not on file   Social Determinants of Health   Financial Resource Strain: Not on file  Food Insecurity: Not on file  Transportation Needs: Not on file  Physical Activity: Not on file  Stress: Not on file  Social Connections: Not on file     Family History: The patient's*family history is not on file.  ROS:   Please see the history of present illness.    All other systems reviewed and are negative.  EKGs/Labs/Other Studies Reviewed:    The following studies were reviewed today:   TAVR OPERATIVE NOTE     Date of Procedure:                11/01/2020   Preoperative Diagnosis:      Severe Aortic Stenosis    Postoperative Diagnosis:    Same    Procedure:        Transcatheter Aortic Valve Replacement - Percutaneous  Transfemoral Approach             Edwards Sapien 3 Ultra THV (size 26 mm, model # 9750TFX, serial # [id number])              Co-Surgeons:                        Gaye Pollack, MD and Sherren Mocha, MD   Anesthesiologist:                  [name] Nations, MD   Echocardiographer:  Jenkins Rouge, MD   Pre-operative Echo Findings: Severe aortic stenosis Normal left ventricular systolic function 3+ MR   Post-operative Echo Findings: No paravalvular leak Normal/unchanged left ventricular systolic  function   ________________________     Echo 11/02/20:  IMPRESSIONS   1. Left ventricular ejection fraction, by estimation, is 55 to 60%. The  left ventricle has normal function. The left ventricle has no regional  wall motion abnormalities. Left ventricular diastolic parameters are  indeterminate.   2. Right ventricular systolic function is mildly reduced. The right  ventricular size is mildly enlarged.   3. Left atrial size was mildly dilated.   4. Right atrial size was moderately dilated.   5. The pericardial effusion is posterior and lateral to the left  ventricle.   6. Splay artifact previous TEE suggests small flail P2 segment with  anteriorly directed MR. The mitral valve is abnormal. Moderate mitral  valve regurgitation. No evidence of mitral stenosis. Severe mitral annular  calcification.   7. Tricuspid valve regurgitation is moderate.   8. Post TAVR with 26 mm Sapien 3 valve no PVL mean gradient 8 peak 14  mmHg AVA 2.3 cm2 DVI 0.44. The aortic valve has been repaired/replaced.  Aortic valve regurgitation is not visualized. No aortic stenosis is  present. There is a 26 mm Sapien prosthetic   (TAVR) valve present in the aortic position. Procedure Date: 11/01/2020.   9. The inferior vena cava is normal in size with greater than 50%  respiratory variability, suggesting right atrial pressure of 3 mmHg.    _____________________  11/04/2020-11/18/2020  [facility] AT Study Highlights Enrollment 11/04/2020-11/18/2020 (14 days 0 hours). 1 run of non-sustained Ventricular Tachycardia occurred lasting 8 beats (3.8 seconds) with a max rate of 188 bpm (avg 144 bpm). Atrial Fibrillation occurred continuously (100% burden), ranging from 42-147 bpm (avg of 79 bpm). Isolated VEs were rare (<1.0%, 3716), VE Couplets were rare (<1.0%, 95), and VE Triplets were rare (<1.0%, 1).    Impression: 1. Permanent atrial fibrillation with average heart rate 79 bpm (minimum 42 bpm).  2. No significant bradycardia  post TAVR.  3. Rare ectopy.   _____________________  Echo 12/07/20  IMPRESSIONS  1. Left ventricular ejection fraction, by estimation, is 60 to 65%. The left ventricle has normal function. The left ventricle has no regional wall motion abnormalities. There is mild concentric left ventricular hypertrophy. Left ventricular diastolic  function could not be evaluated.  2. Right ventricular systolic function is normal. The right ventricular size is mildly enlarged. There is moderately elevated pulmonary artery systolic pressure. The estimated right ventricular systolic pressure is 56.8 mmHg.  3. Left atrial size was severely dilated.  4. Right atrial size was severely dilated.  5. The pericardial effusion is posterior to the left ventricle.  6. The mitral valve is degenerative. Moderate mitral valve regurgitation. Mild mitral stenosis. The mean mitral valve gradient is 4.0 mmHg with average heart rate of 77 bpm. Severe mitral annular calcification.  7. Tricuspid valve regurgitation is mild to moderate.  8. The aortic valve has been repaired/replaced. Aortic valve regurgitation is not visualized. There is a 26 mm Sapien prosthetic (TAVR) valve present in the aortic position. Echo findings are consistent with normal structure and function of the aortic  valve prosthesis. Aortic valve mean gradient measures 6.8 mmHg. Aortic valve Vmax measures 1.74 m/s. Aortic valve acceleration time measures 56 msec.  ____________________  Echo 10/11/21 IMPRESSIONS  1. Left ventricular ejection fraction, by estimation, is 60 to 65%. The left  ventricle has normal function. The left ventricle has no regional wall motion abnormalities. There is moderate concentric left ventricular hypertrophy. Left ventricular  diastolic function could not be evaluated. There is the interventricular septum is flattened in systole and diastole, consistent with right ventricular pressure and volume overload.  2. Right ventricular systolic  function is normal. The right ventricular size is mildly enlarged.  3. Left atrial size was severely dilated.  4. Right atrial size was severely dilated.  5. The mitral valve is degenerative. Mild mitral valve regurgitation. Mild mitral stenosis. The mean mitral valve gradient is 3.0 mmHg with average heart rate of 57 bpm. Severe mitral annular calcification.  6. The tricuspid valve is abnormal. Tricuspid valve regurgitation is moderate to severe.  7. The aortic valve has been repaired/replaced. Aortic valve regurgitation is not visualized. There is a 26 mm Edwards Ultra, stented (TAVR) valve present in the aortic position. Echo findings are consistent with normal structure and function of the  aortic valve prosthesis. Aortic valve mean gradient measures 6.7 mmHg. Aortic valve Vmax measures 1.76 m/s. Aortic valve acceleration time measures 99 msec.   Comparison(s): No significant change from prior study. Prior images reviewed side by side. Pericardial effusion has resolved.    EKG:  EKG is NOT ordered today.   Recent Labs: 10/12/2020: NT-Pro BNP 7,048 10/28/2020: B Natriuretic Peptide 498.5 10/31/2020: ALT 11 11/02/2020: Magnesium 1.6 10/11/2021: BUN WILL FOLLOW; Creatinine, Ser WILL FOLLOW; Hemoglobin 14.5; Platelets 220; Potassium WILL FOLLOW; Sodium WILL FOLLOW  Recent Lipid Panel No results found for: CHOL, TRIG, HDL, CHOLHDL, VLDL, LDLCALC, LDLDIRECT   Risk Assessment/Calculations:    CHA2DS2-VASc Score = 5  This indicates a 7.2% annual risk of stroke. The patient's score is based upon: CHF History: 1 HTN History: 1 Diabetes History: 1 Stroke History: 0 Vascular Disease History: 0 Age Score: 2 Gender Score: 0    Physical Exam:    VS:  BP 128/68    Pulse (!) 57    Ht _0  (1.676 m)    Wt 191 lb 9.6 oz (86.9 kg)    SpO2 91%    BMI 30.93 kg/m     Wt Readings from Last 3 Encounters:  10/11/21 191 lb 9.6 oz (86.9 kg)  03/14/21 191 lb 3.2 oz (86.7 kg)  12/07/20 190 lb 12.8 oz  (86.5 kg)     GEN:  Well nourished, well developed in no acute distress, in wheelchair HEENT: Normal NECK: No JVD; LYMPHATICS: No lymphadenopathy CARDIAC: irreg irreg, 2/6 holosystolic murmur at apex. No rubs, gallops RESPIRATORY:  Clear to auscultation without rales, wheezing or rhonchi  ABDOMEN: Soft, non-tender, non-distended MUSCULOSKELETAL:  No edema; No deformity  SKIN: Warm and dry. No LE edema NEUROLOGIC:  Alert and oriented x 3 PSYCHIATRIC:  Normal affect   ASSESSMENT:    1. S/P TAVR (transcatheter aortic valve replacement)   2. Diastolic congestive heart failure, unspecified HF chronicity ([place])   3. Atrial fibrillation, chronic ([place])   4. Mitral valve insufficiency, unspecified etiology   5. Panlobular emphysema ([place])   6. RBBB     PLAN:    In order of problems listed above:  Severe AS s/p TAVR: echo today shows EF 60%, normally functioning TAVR with a mean gradient of 7 mm hg and no PVL. There is mild MR and moderate to severe TR. He is doing fantastic with NYHA class I symptoms. Continue on apixiban 71m BID. He has amoxicillin for SBE prophylaxis. Continue regular follow up with Dr. OAudie Box  Chronic diastolic CHF: appears euvolemic on Torsemide 63m daily. Continue Kdur 178m daily. He was changed to PRN torsemide at last visit with Dr. O'Audie Boxut never changed and is doing well on current therapy. Will get BMET today   Chronic afib: rate well controlled today. Continue Eliquis 100m22mID. Check CBC and BMET today  Moderate to severe MR: echo today showed mild MR/MS and severe MAC. There is also moderate to severe TR.  COPD: continued on home inhalers.  RBBB: stable with no high risk symptoms.  Medication Adjustments/Labs and Tests Ordered: Current medicines are reviewed at length with the patient today.  Concerns regarding medicines are outlined above.  Orders Placed This Encounter  Procedures   Basic metabolic panel   CBC   No orders of the defined types were  placed in this encounter.   Patient Instructions  Medication Instructions:  Your physician recommends that you continue on your current medications as directed. Please refer to the Current Medication list given to you today.  *If you need a refill on your cardiac medications before your next appointment, please call your pharmacy*   Lab Work: Bmp. Cbc- Today   If you have labs (blood work) drawn today and your tests are completely normal, you will receive your results only by: MyCRollaf you have MyChart) OR A paper copy in the mail If you have any lab test that is abnormal or we need to change your treatment, we will call you to review the results.   Testing/Procedures: None ordered    Follow-Up: Follow up with Dr. WesEleonore Chiquito Monday, August 7 at 9:40 AM    Other Instructions None    Signed, KatAngelena FormA-C  10/12/2021 8:47 AM    [facility]

## 2021-10-11 ENCOUNTER — Other Ambulatory Visit: Payer: Self-pay

## 2021-10-11 ENCOUNTER — Ambulatory Visit (HOSPITAL_COMMUNITY): Payer: Medicare Other | Attending: Cardiovascular Disease

## 2021-10-11 ENCOUNTER — Ambulatory Visit (INDEPENDENT_AMBULATORY_CARE_PROVIDER_SITE_OTHER): Payer: Medicare Other | Admitting: Physician Assistant

## 2021-10-11 VITALS — BP 128/68 | HR 57 | Ht 66.0 in | Wt 191.6 lb

## 2021-10-11 DIAGNOSIS — Z952 Presence of prosthetic heart valve: Secondary | ICD-10-CM

## 2021-10-11 DIAGNOSIS — I5030 Unspecified diastolic (congestive) heart failure: Secondary | ICD-10-CM | POA: Diagnosis not present

## 2021-10-11 DIAGNOSIS — I340 Nonrheumatic mitral (valve) insufficiency: Secondary | ICD-10-CM

## 2021-10-11 DIAGNOSIS — I482 Chronic atrial fibrillation, unspecified: Secondary | ICD-10-CM

## 2021-10-11 DIAGNOSIS — I4510 Unspecified right bundle-branch block: Secondary | ICD-10-CM

## 2021-10-11 DIAGNOSIS — J431 Panlobular emphysema: Secondary | ICD-10-CM

## 2021-10-11 LAB — ECHOCARDIOGRAM COMPLETE
AV Mean grad: 6.7 mmHg
AV Peak grad: 12.4 mmHg
Ao pk vel: 1.76 m/s
MV M vel: 2.53 m/s
MV Peak grad: 25.6 mmHg
S' Lateral: 2 cm

## 2021-10-11 NOTE — Patient Instructions (Signed)
Medication Instructions:  ?Your physician recommends that you continue on your current medications as directed. Please refer to the Current Medication list given to you today. ? ?*If you need a refill on your cardiac medications before your next appointment, please call your pharmacy* ? ? ?Lab Work: ?Bmp. Cbc- Today  ? ?If you have labs (blood work) drawn today and your tests are completely normal, you will receive your results only by: ?MyChart Message (if you have MyChart) OR ?A paper copy in the mail ?If you have any lab test that is abnormal or we need to change your treatment, we will call you to review the results. ? ? ?Testing/Procedures: ?None ordered  ? ? ?Follow-Up: ?Follow up with Dr. Lennie Odor on Monday, August 7 at 9:40 AM  ? ? ?Other Instructions ?None  ?

## 2021-10-13 LAB — BASIC METABOLIC PANEL
BUN/Creatinine Ratio: 37 — ABNORMAL HIGH (ref 10–24)
BUN: 35 mg/dL (ref 10–36)
CO2: 25 mmol/L (ref 20–29)
Calcium: 9.2 mg/dL (ref 8.6–10.2)
Chloride: 97 mmol/L (ref 96–106)
Creatinine, Ser: 0.94 mg/dL (ref 0.76–1.27)
Glucose: 92 mg/dL (ref 70–99)
Potassium: 4.5 mmol/L (ref 3.5–5.2)
Sodium: 138 mmol/L (ref 134–144)
eGFR: 76 mL/min/{1.73_m2} (ref >59)

## 2021-10-13 LAB — CBC
Hematocrit: 42.7 % (ref 37.5–51.0)
Hemoglobin: 14.5 g/dL (ref 13.0–17.7)
MCH: 33.6 pg — ABNORMAL HIGH (ref 26.6–33.0)
MCHC: 34 g/dL (ref 31.5–35.7)
MCV: 99 fL — ABNORMAL HIGH (ref 79–97)
Platelets: 220 10*3/uL (ref 150–450)
RBC: 4.32 x10E6/uL (ref 4.14–5.80)
RDW: 13.1 % (ref 11.6–15.4)
WBC: 6.8 10*3/uL (ref 3.4–10.8)

## 2021-10-21 ENCOUNTER — Encounter: Payer: Self-pay | Admitting: Cardiovascular Disease

## 2021-10-23 MED ORDER — APIXABAN 5 MG PO TABS: 5.0000 mg | ORAL_TABLET | Freq: Two times a day (BID) | ORAL | 1 refills | Status: AC

## 2021-12-25 ENCOUNTER — Ambulatory Visit (INDEPENDENT_AMBULATORY_CARE_PROVIDER_SITE_OTHER): Payer: Medicare Other | Admitting: Podiatry

## 2021-12-25 ENCOUNTER — Encounter: Payer: Self-pay | Admitting: Podiatry

## 2021-12-25 DIAGNOSIS — B351 Tinea unguium: Secondary | ICD-10-CM

## 2021-12-25 DIAGNOSIS — M79674 Pain in right toe(s): Secondary | ICD-10-CM | POA: Diagnosis not present

## 2021-12-25 DIAGNOSIS — M79675 Pain in left toe(s): Secondary | ICD-10-CM | POA: Diagnosis not present

## 2021-12-25 NOTE — Progress Notes (Signed)
This patient returns to the office for evaluation and treatment of long thick painful nails .  This patient is unable to trim his own nails since the patient cannot reach his feet.  Patient says the nails are painful walking and wearing his shoes.  He returns for preventive foot care services.  General Appearance  Alert, conversant and in no acute stress.  Vascular  Dorsalis pedis and posterior tibial  pulses are weakly  palpable  bilaterally.  Capillary return is within normal limits  bilaterally. Temperature is within normal limits  bilaterally.  Neurologic  Senn-Weinstein monofilament wire test within normal limits  bilaterally. Muscle power within normal limits bilaterally.  Nails Thick disfigured discolored nails with subungual debris  from hallux to fifth toes bilaterally. No evidence of bacterial infection or drainage bilaterally.  Orthopedic  No limitations of motion  feet .  No crepitus or effusions noted.  No bony pathology or digital deformities noted.  Skin  normotropic skin with no porokeratosis noted bilaterally.  No signs of infections or ulcers noted.     Onychomycosis  Pain in toes right foot  Pain in toes left foot  Debridement  of nails  1-5  B/L with a nail nipper.  Nails were then filed using a dremel tool with no incidents.    RTC 3 months   Gustave Lindeman DPM  

## 2022-02-06 IMAGING — DX DG FEMUR 2+V*L*
5 series · 5 of 5 positions shown · non-contrast
Comparison: None.

CLINICAL DATA: Fall this morning.  Left hip pain

EXAM:
LEFT FEMUR 2 VIEWS

[t femur proximal ap left]
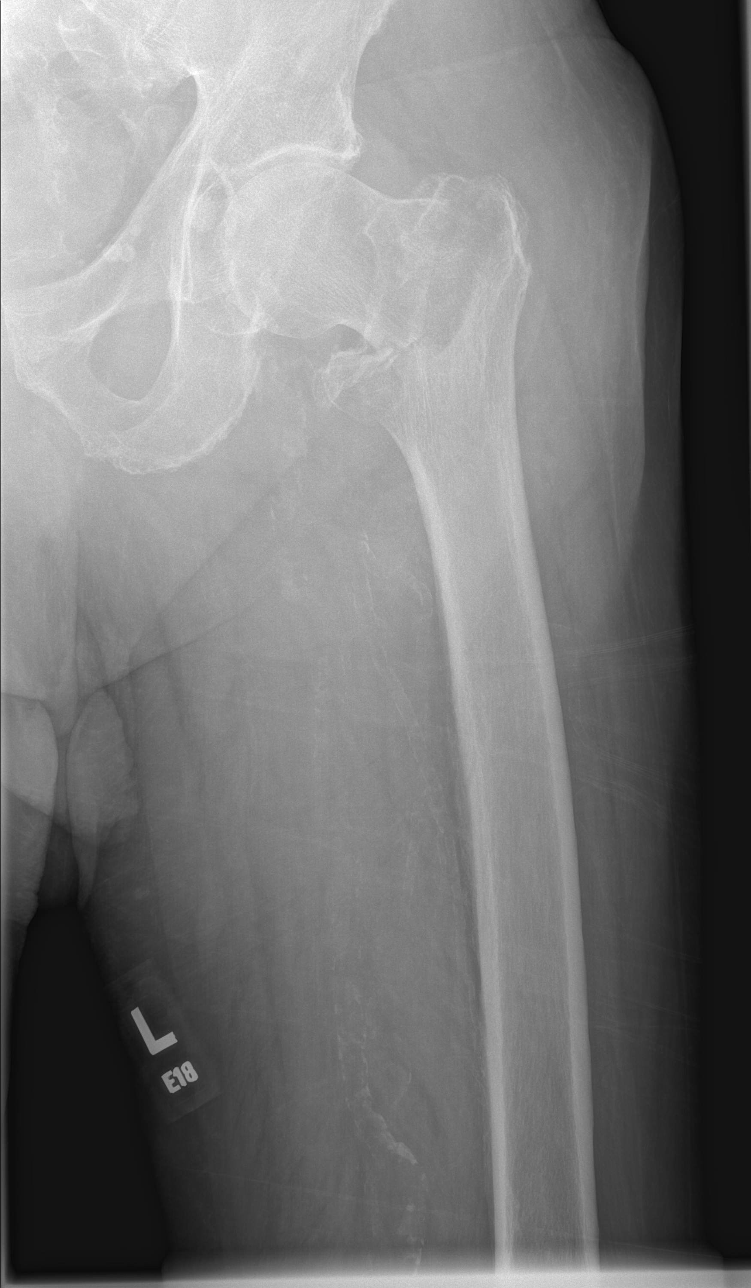

[t femur distal ap left]
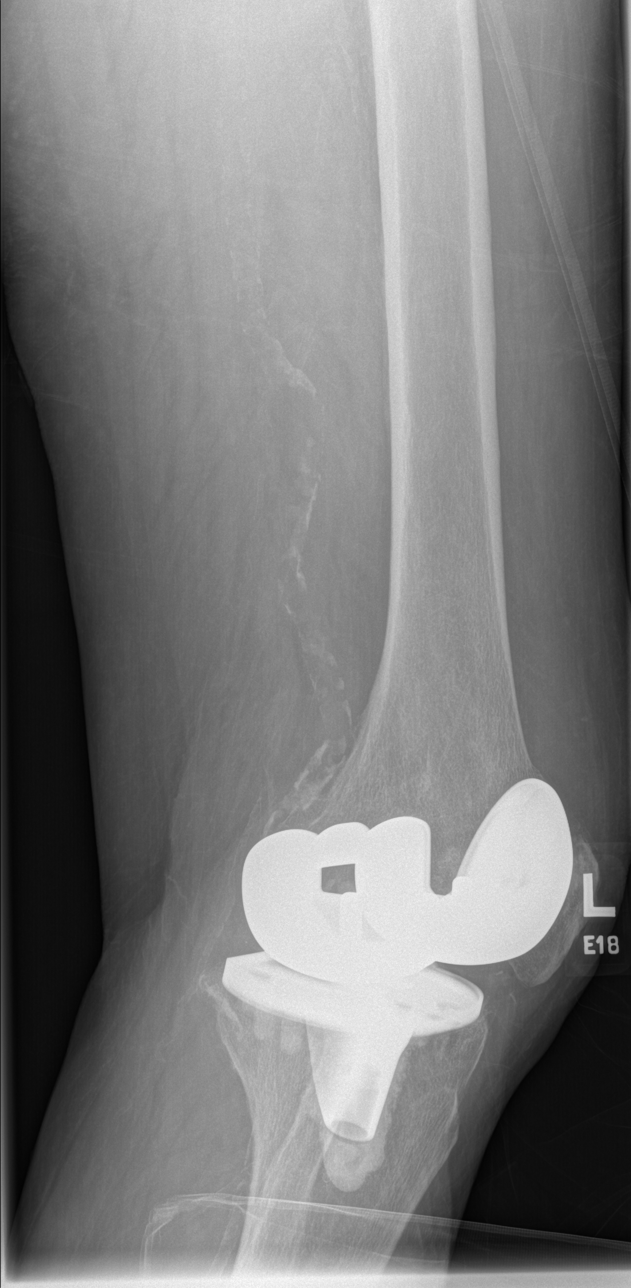

[x femur distal lat left]
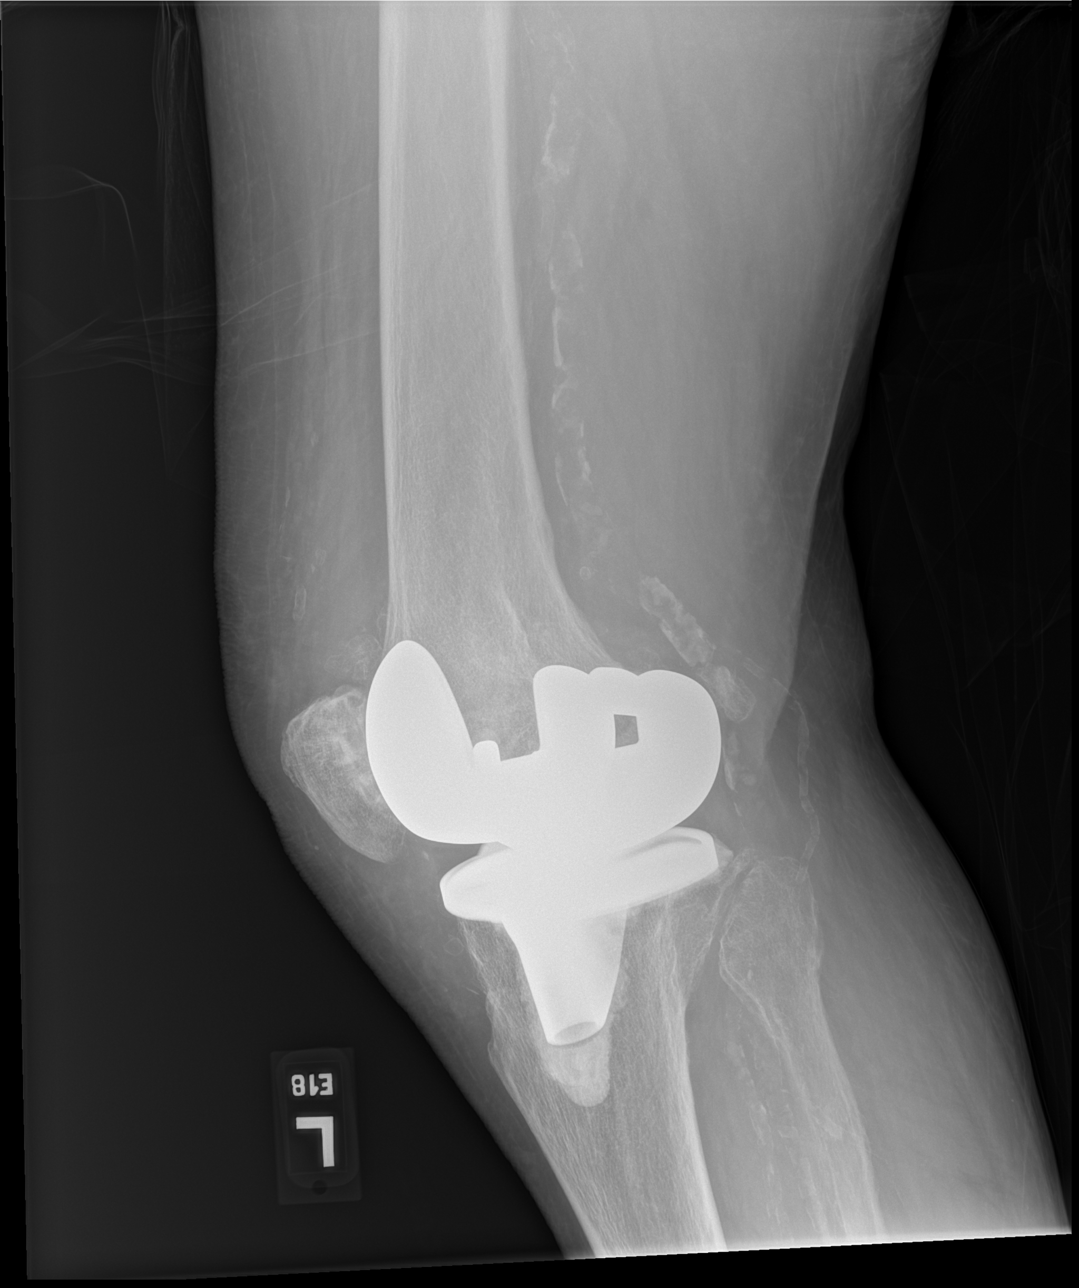

[x hip lat left (1 of 2)]
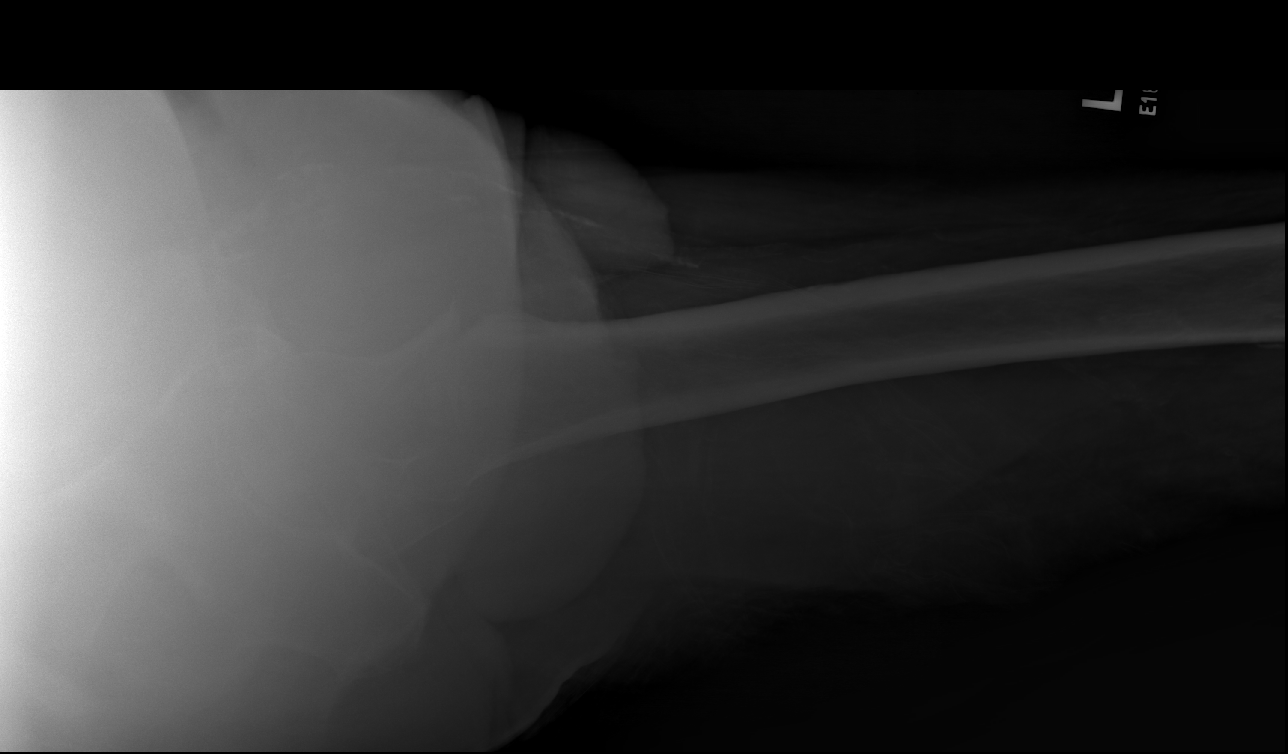

[x hip lat left (2 of 2)]
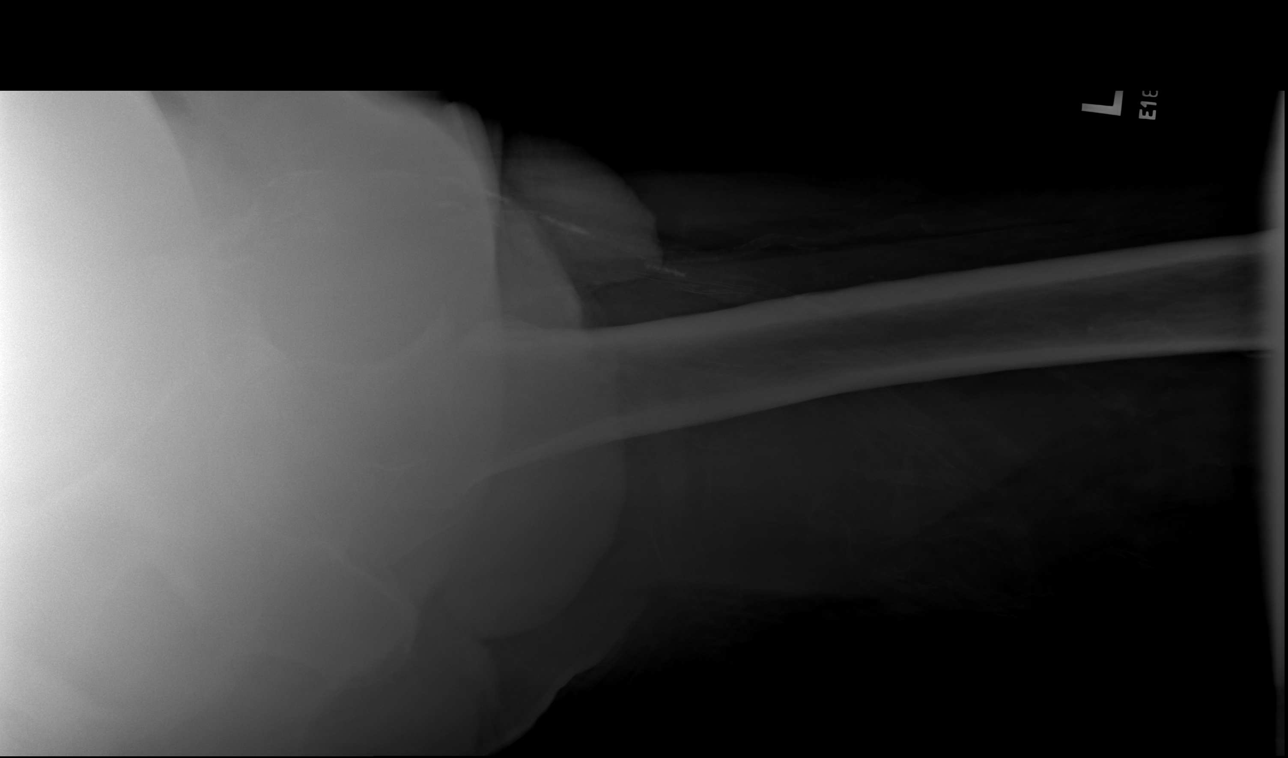

[5 of 5 positions shown; findings below may reference images not displayed]

FINDINGS: Acute intertrochanteric left femur fracture with medial impaction.

Knee arthroplasty without acute superimposed finding.

Generalized osteopenia and atherosclerosis.
IMPRESSION: Intertrochanteric left femur fracture with medial impaction.

## 2022-02-06 IMAGING — DX DG HIP (WITH OR WITHOUT PELVIS) 1V PORT*L*
3 series · 4 of 4 positions shown · non-contrast
Comparison: Preoperative radiographs earlier this day.

CLINICAL DATA: Status post IM nail.

EXAM:
DG HIP (WITH OR WITHOUT PELVIS) 1V PORT LEFT

[pelvis]
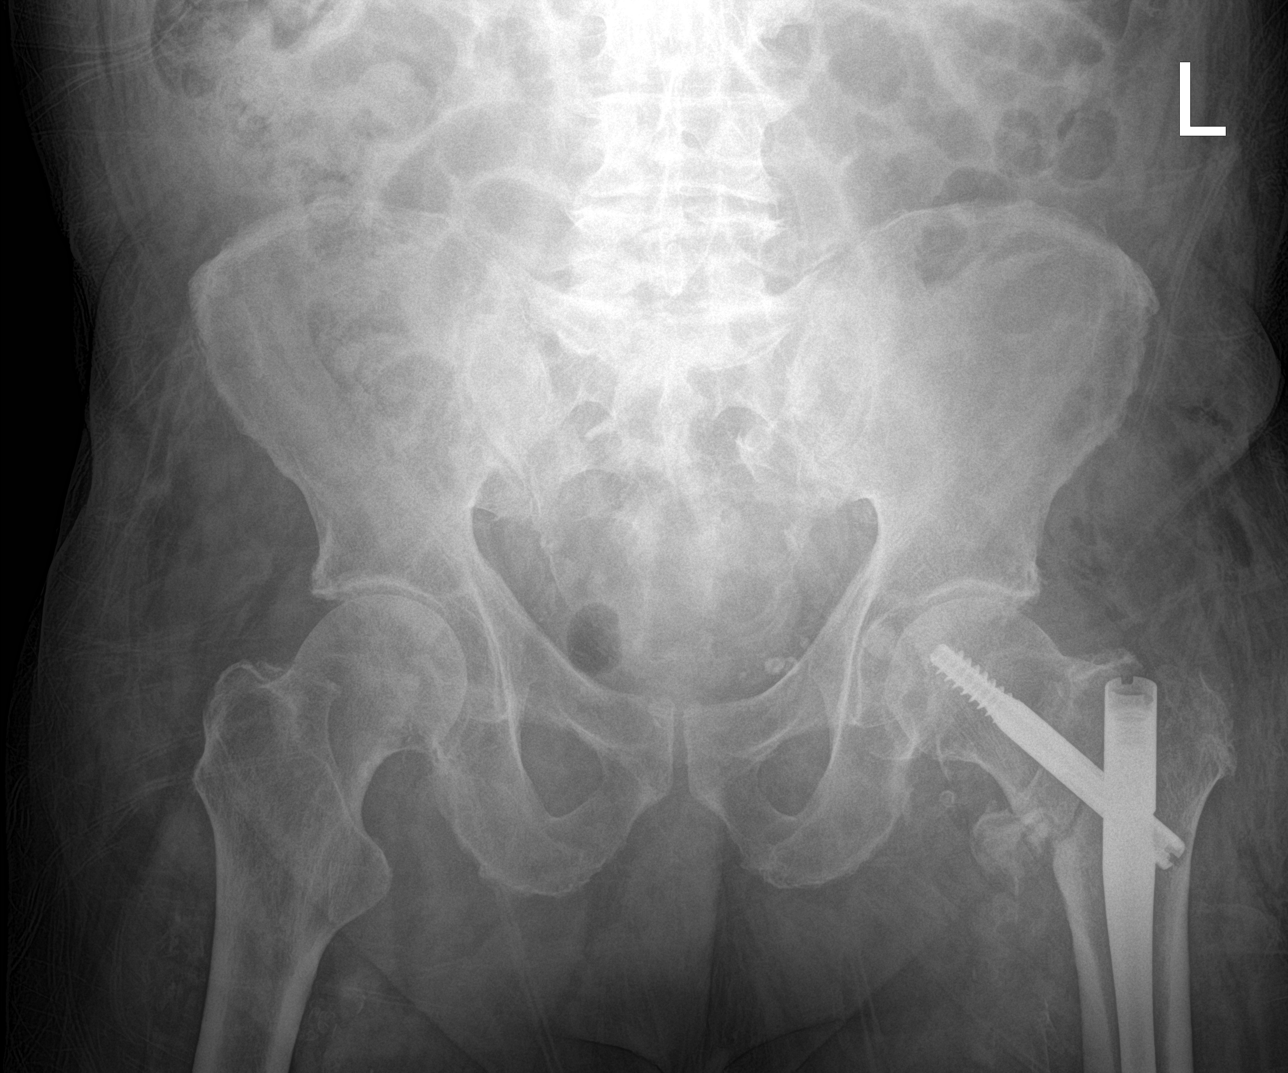

[hip]
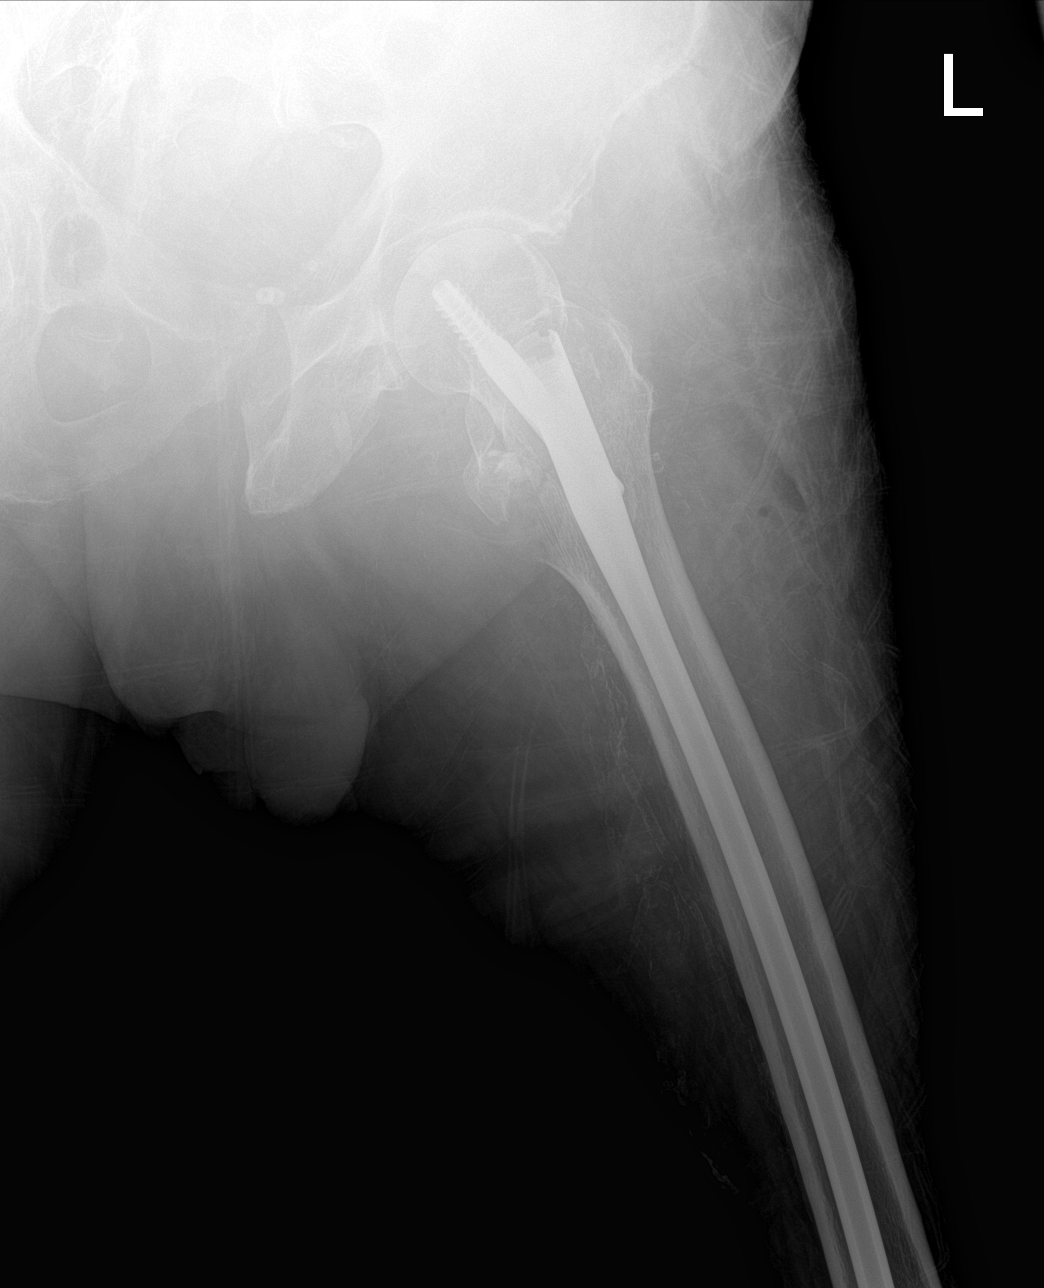

[Series 4: femur · 0.14mm/px · 2 of 2 slices shown]
[im 1/2]
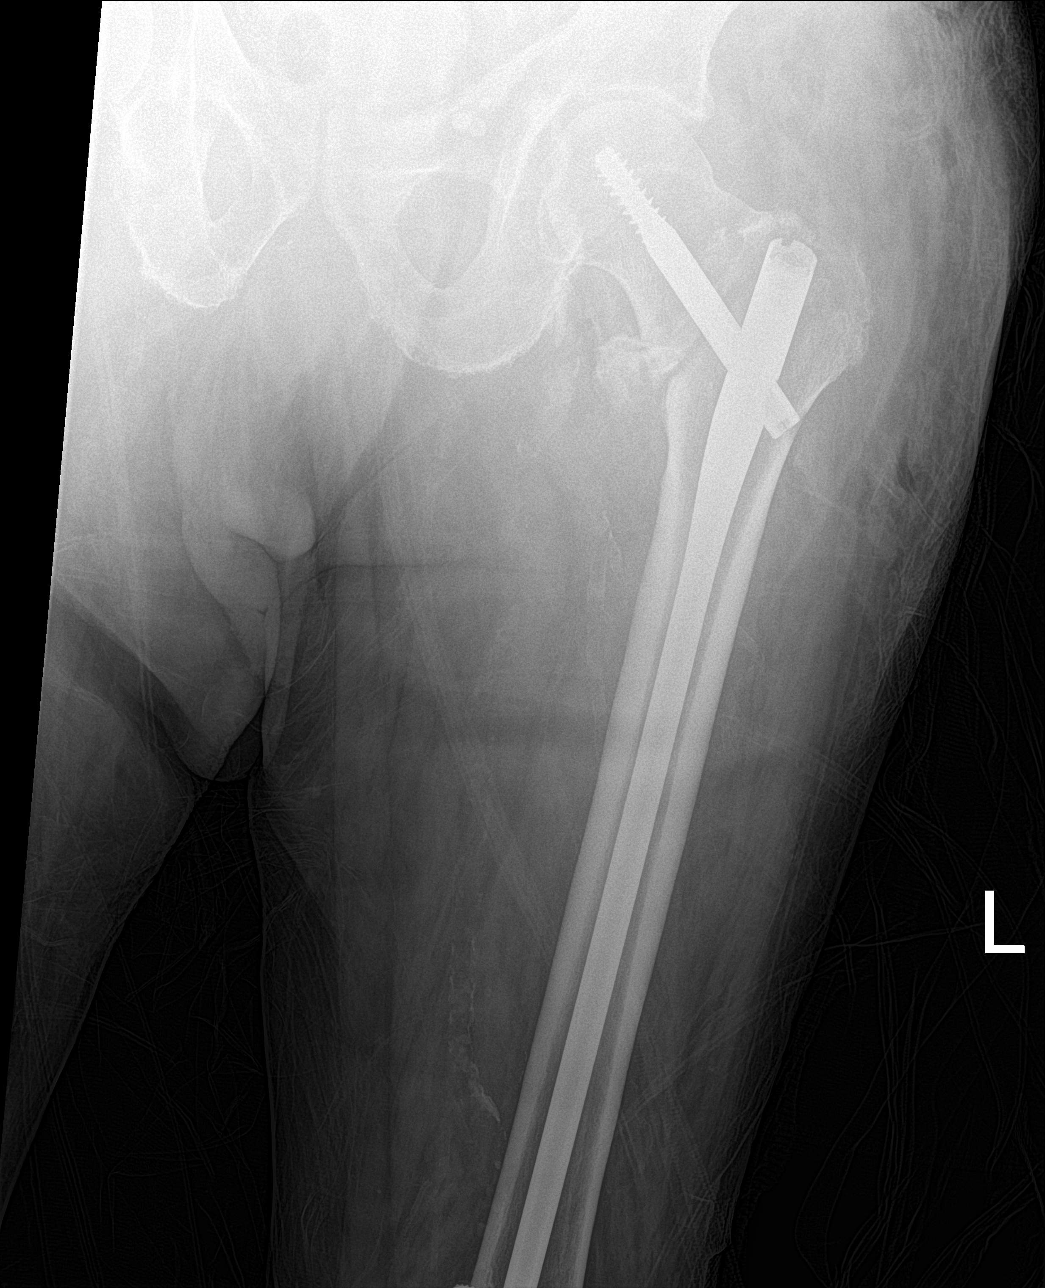
[im 2/2]
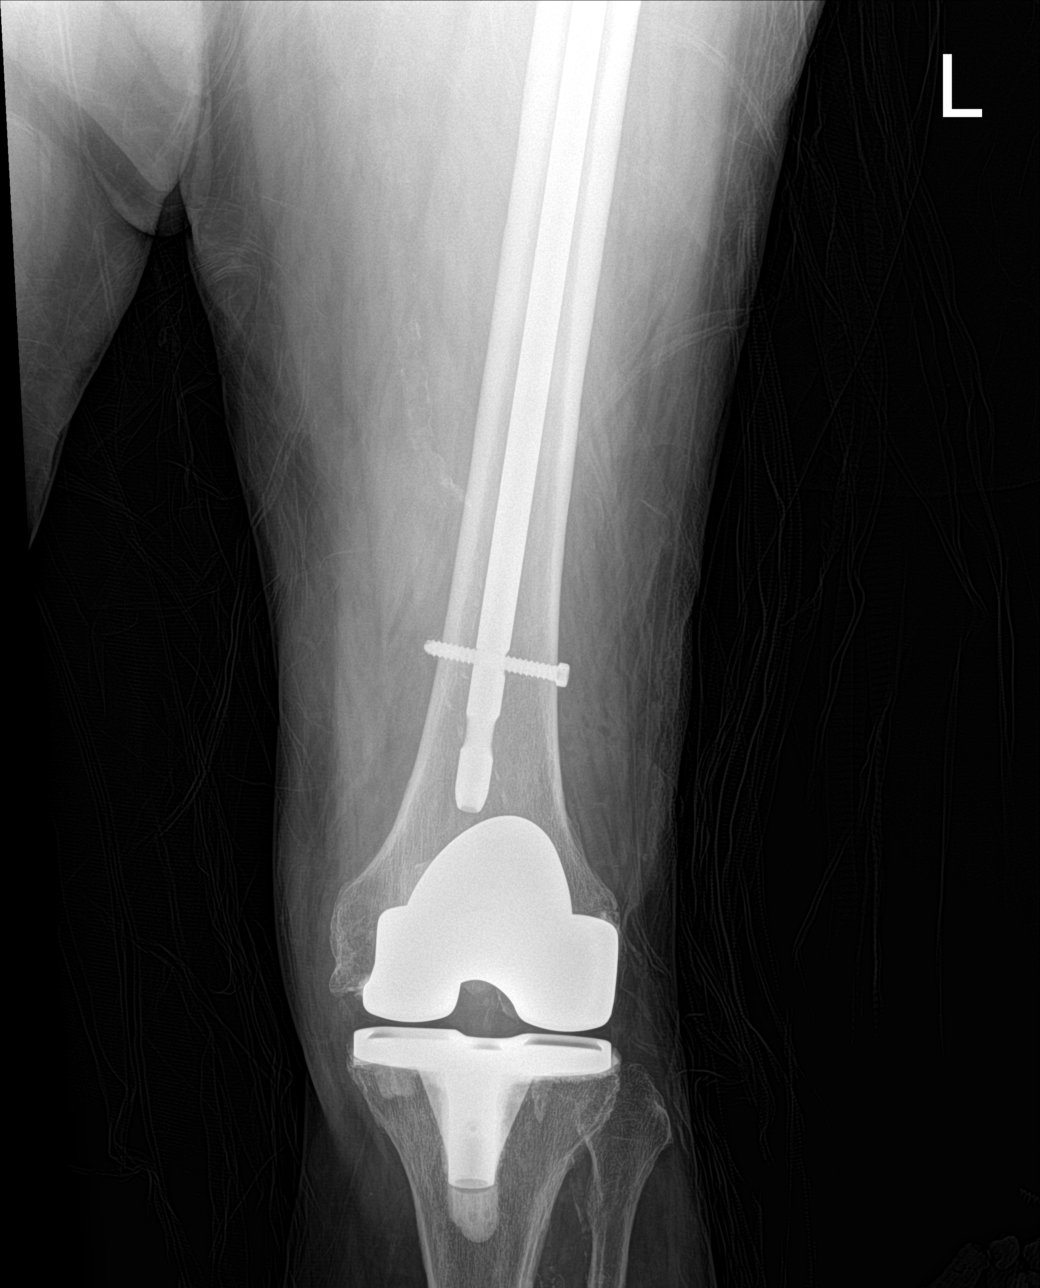

[4 of 4 positions shown; findings below may reference images not displayed]

FINDINGS: Intramedullary nail with trans trochanteric and distal screw
fixation of intertrochanteric femur fracture. Improved fracture
alignment from preoperative imaging. Lesser trochanteric fragment
remains displaced. Knee arthroplasty partially included. Recent
postsurgical change includes air and edema in the adjacent soft
tissues.
IMPRESSION: ORIF of intertrochanteric left femur fracture, in improved alignment
from preoperative imaging. No immediate postoperative complication.

## 2022-02-06 IMAGING — DX DG PELVIS 1-2V
1 series · 1 of 1 positions shown · non-contrast
Comparison: None.

CLINICAL DATA: Fall this morning.  Left hip pain

EXAM:
PELVIS - 1-2 VIEW

[t pelvis ap]
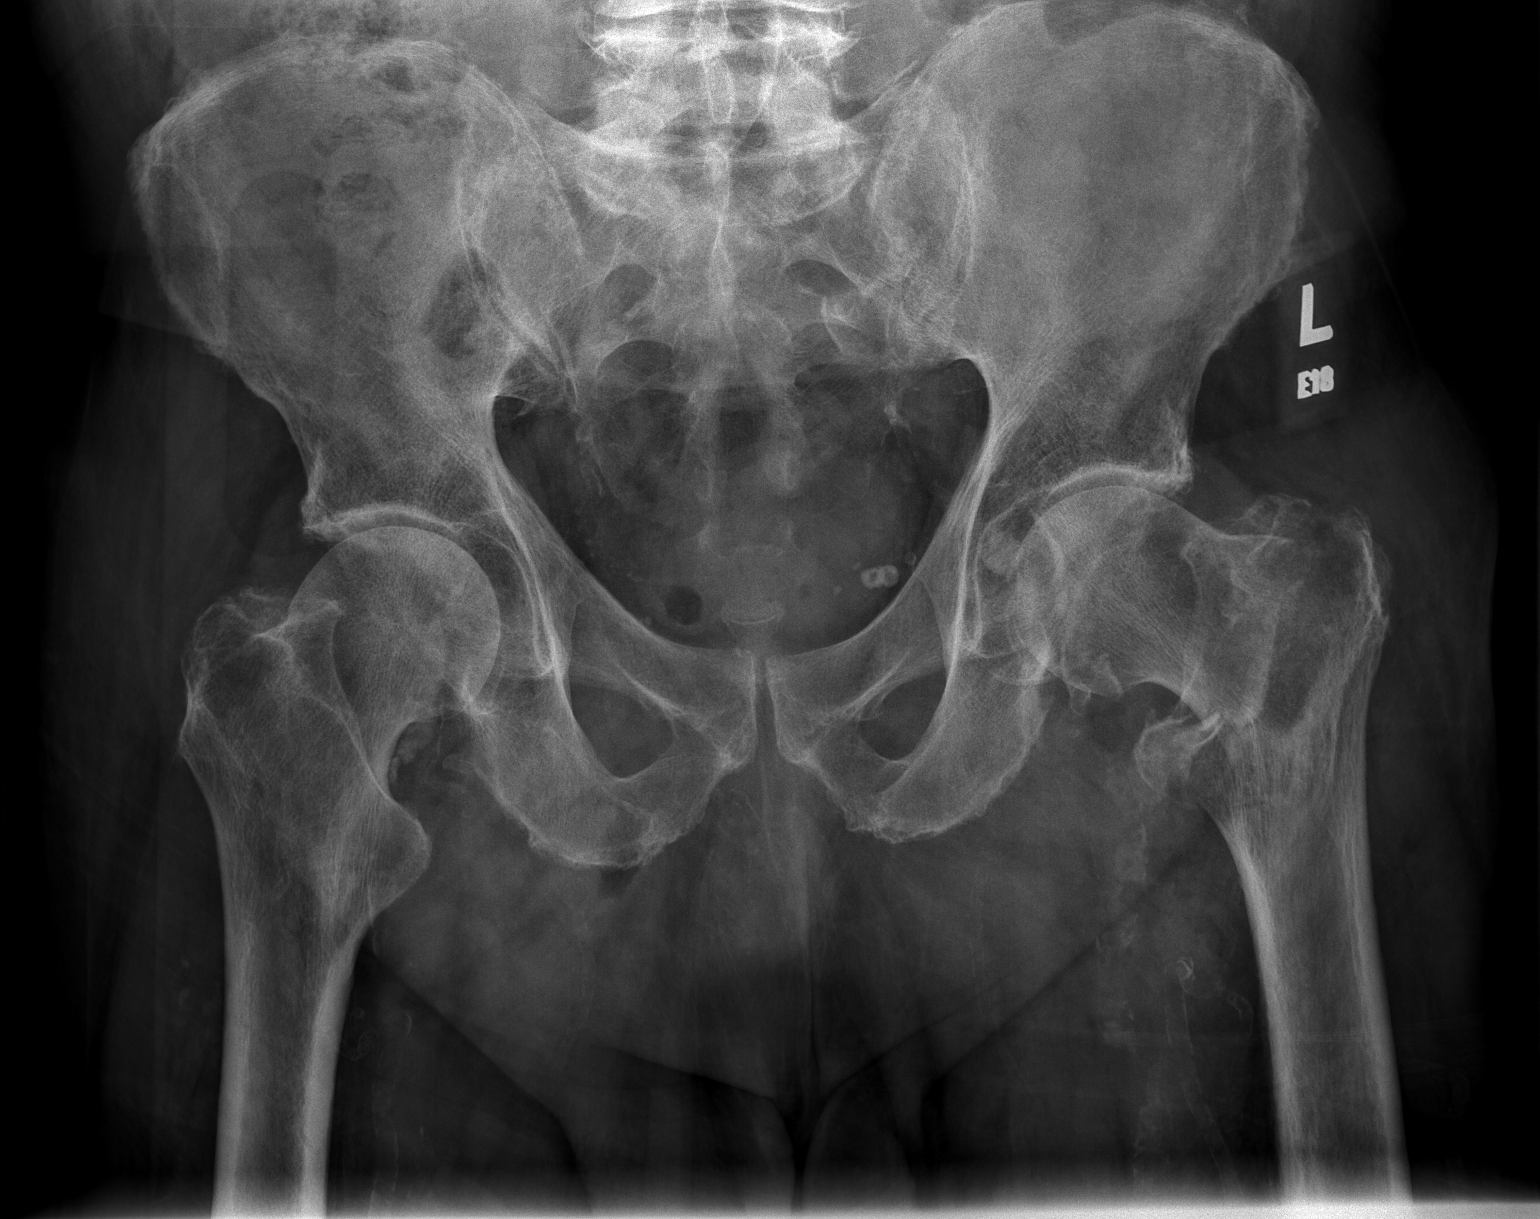

[1 of 1 positions shown; findings below may reference images not displayed]

FINDINGS: Acute intertrochanteric left femur fracture with medial impaction.

No evidence of pelvic ring fracture or diastasis. Osteopenia and
atherosclerosis.
IMPRESSION: Acute intertrochanteric left femur fracture.

## 2022-03-09 NOTE — Progress Notes (Signed)
Cardiology Office Note:   Date:  03/12/2022  NAME:  Ruben [name]    MRN: [id number] DOB:  [birth year]   PCP:  Alroy Dust, L.Marlou Sa, MD  Cardiologist:  Evalina Field, MD  Electrophysiologist:  None   Referring MD: Aurea Graff.Marlou Sa, MD   Chief Complaint  Patient presents with   Follow-up        History of Present Illness:   Ruben [name] is a 88 y.o. male with a hx of COPD, severe stenosis status post TAVR, HFpEF, pulm hypertension who presents for follow-up.   He reports he is doing well.  Denies any chest pain or trouble breathing.  He has come off oxygen.  Saturations in the 90s.  He reports no swelling.  No edema.  Weights are stable.  He has done well since his TAVR.  No bleeding on Eliquis.  Weights are stable on torsemide 20 mg daily.  Overall doing well.  Most recent echo shows stable TAVR valve.  He does have moderate-severe mitral valve regurgitation but this is likely just going to be managed medically.  Problem List COPD Persistent Afib Severe AS s/p TAVR -11/01/2020 26 mm S3 4. 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 HFpEF 6. 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    Severe aortic stenosis     Past Surgical History: Past Surgical History:  Procedure Laterality Date   APPENDECTOMY     BUBBLE STUDY  09/07/2020   Procedure: BUBBLE STUDY;  Surgeon: Geralynn Rile, MD;  Location: [place];  Service: Cardiovascular;;   CARDIAC CATHETERIZATION     CLAVICLE SURGERY     INTRAMEDULLARY (IM) NAIL INTERTROCHANTERIC Left 09/20/2020   Procedure: INTRAMEDULLARY (IM) NAIL  INTERTROCHANTRIC;  Surgeon: Renette Butters, MD;  Location: [place];  Service: Orthopedics;  Laterality: Left;   RIGHT/LEFT HEART CATH AND CORONARY ANGIOGRAPHY N/A 08/24/2020   Procedure: RIGHT/LEFT HEART CATH AND CORONARY ANGIOGRAPHY;  Surgeon: Sherren Mocha, MD;  Location: [place] [facility];  Service: Cardiovascular;  Laterality: N/A;   TEE WITHOUT CARDIOVERSION N/A 09/07/2020   Procedure: TRANSESOPHAGEAL ECHOCARDIOGRAM (TEE);  Surgeon: Geralynn Rile, MD;  Location: [place];  Service: Cardiovascular;  Laterality: N/A;   TOTAL KNEE ARTHROPLASTY Bilateral    TRANSCATHETER AORTIC VALVE REPLACEMENT, TRANSFEMORAL N/A 11/01/2020   Procedure: TRANSCATHETER AORTIC VALVE REPLACEMENT, TRANSFEMORAL;  Surgeon: Sherren Mocha, MD;  Location: [facility] [facility];  Service: Open Heart Surgery;  Laterality: N/A;    Current Medications: Current Meds  Medication Sig   acetaminophen (TYLENOL) 500 MG tablet Take 1,000 mg by mouth in the morning, at noon, and at bedtime.   amoxicillin (AMOXIL) 500 MG tablet Take 4 tablets (2,000 mg) one hour prior to all dental visits.   apixaban (ELIQUIS) 5 MG TABS tablet Take 1 tablet (5 mg total) by mouth 2 (two) times daily.   diclofenac Sodium (VOLTAREN) 1 % GEL Apply 1 application topically 3 (three) times daily as needed (to both knees for arthritis pain).   dutasteride (AVODART) 0.5 MG capsule Take 0.5 mg  by mouth in the morning and at bedtime.   guaiFENesin (MUCINEX) 600 MG 12 hr tablet Take 1 tablet (600 mg total) by mouth 2 (two) times daily as needed for cough or to loosen phlegm.   KLOR-CON M10 10 MEQ tablet Take 1 tablet (10 mEq total) by mouth daily.   metoprolol succinate (TOPROL-XL) 50 MG 24 hr tablet Take 50 mg by mouth daily.   montelukast (SINGULAIR) 10 MG tablet Take 10 mg by mouth at bedtime.   polyethylene glycol (MIRALAX / GLYCOLAX) 17 g packet Take 17 g by mouth daily as needed for mild constipation.   polyvinyl alcohol (LIQUIFILM TEARS) 1.4  % ophthalmic solution Place 1 drop into both eyes as needed for dry eyes.   senna-docusate (SENOKOT-S) 8.6-50 MG tablet Take 2 tablets by mouth daily.   tamsulosin (FLOMAX) 0.4 MG CAPS capsule Take 0.8 mg by mouth at bedtime.   torsemide (DEMADEX) 20 MG tablet Take 20 mg by mouth daily.   traMADol (ULTRAM) 50 MG tablet Take 1 tablet (50 mg total) by mouth 2 (two) times daily as needed  for pain.   vitamin B-12 (CYANOCOBALAMIN) 500 MCG tablet Take 500 mcg by mouth daily.     Allergies:    Patient has no known allergies.   Social History: Social History   Socioeconomic History   Marital status: Married    Spouse name: Not on file   Number of children: Not on file   Years of education: Not on file   Highest education level: Not on file  Occupational History   Occupation: retired  Tobacco Use   Smoking status: Former    Packs/day: 1.00    Years: 30.00    Total pack years: 30.00    Types: Cigarettes    Quit date: 1975    Years since quitting: 48.6   Smokeless tobacco: Never  Vaping Use   Vaping Use: Never used  Substance and Sexual Activity   Alcohol use: Not Currently   Drug use: Never   Sexual activity: Not on file  Other Topics Concern   Not on file  Social History Narrative   Not on file   Social Determinants of Health   Financial Resource Strain: Not on file  Food Insecurity: No Food Insecurity (07/19/2020)   Hunger Vital Sign    Worried About Running Out of Food in the Last Year: Never true    Ran Out of Food in the Last Year: Never true  Transportation Needs: No Transportation Needs (07/19/2020)   PRAPARE - Hydrologist (Medical): No    Lack of Transportation (Non-Medical): No  Physical Activity: Not on file  Stress: Not on file  Social Connections: Not on file     Family History: The patient's family history is not on file.  ROS:   All other ROS reviewed and negative. Pertinent positives noted in the HPI.      EKGs/Labs/Other Studies Reviewed:   The following studies were personally reviewed by me today:  EKG:  EKG is ordered today.  The ekg ordered today demonstrates atrial fibrillation heart rate 84, right bundle branch block, and was personally reviewed by me.   Recent Labs: 10/11/2021: BUN 35; Creatinine, Ser 0.94; Hemoglobin 14.5; Platelets 220; Potassium 4.5; Sodium 138   Recent Lipid Panel No results found for: "CHOL", "TRIG", "HDL", "CHOLHDL", "VLDL", "LDLCALC", "LDLDIRECT"  Physical Exam:   VS:  BP 112/68   Pulse 82   Ht _0  (1.727 m)  Wt 196 lb (88.9 kg)   SpO2 93%   BMI 29.80 kg/m    Wt Readings from Last 3 Encounters:  03/12/22 196 lb (88.9 kg)  10/11/21 191 lb 9.6 oz (86.9 kg)  03/14/21 191 lb 3.2 oz (86.7 kg)    General: Well nourished, well developed, in no acute distress Head: Atraumatic, normal size  Eyes: PEERLA, EOMI  Neck: Supple, no JVD Endocrine: No thryomegaly Cardiac: Normal S1, S2; irregular rhythm, 3 out of 6 holosystolic murmur Lungs: Clear to auscultation bilaterally, no wheezing, rhonchi or rales  Abd: Soft, nontender, no hepatomegaly  Ext: No edema, pulses 2+ Musculoskeletal: No deformities, BUE and BLE strength normal and equal Skin: Warm and dry, no rashes   Neuro: Alert and oriented to person, place, time, and situation, CNII-XII grossly intact, no focal deficits  Psych: Normal mood and affect   ASSESSMENT:   Ruben [name] is a 88 y.o. male who presents for the following: 1. S/P TAVR (transcatheter aortic valve replacement)   2. Mitral valve insufficiency, unspecified etiology   3. Chronic diastolic heart failure (HCC)     PLAN:   1. S/P TAVR (transcatheter aortic valve replacement) -Doing well since TAVR.  Not on aspirin due to Eliquis use for atrial fibrillation.  Currently rate controlled on metoprolol.  No change to this.  He will continue with SBE prophylaxis.  Overall has done quite well since surgery.  2. Mitral valve  insufficiency, unspecified etiology -Moderate to severe mitral valve regurgitation.  Small flail segment.  Not a candidate for clip.  Continue with medical therapy.  3. Chronic diastolic heart failure (HCC) -Euvolemic on 20 mg of torsemide daily.  Overall doing well.  See me back in 1 year.  Disposition: Return in about 1 year (around 03/13/2023).  Medication Adjustments/Labs and Tests Ordered: Current medicines are reviewed at length with the patient today.  Concerns regarding medicines are outlined above.  Orders Placed This Encounter  Procedures   EKG 12-Lead   ECHOCARDIOGRAM COMPLETE   No orders of the defined types were placed in this encounter.   Patient Instructions  Medication Instructions:  The current medical regimen is effective;  continue present plan and medications.  *If you need a refill on your cardiac medications before your next appointment, please call your pharmacy*   Testing/Procedures: Echocardiogram (12 month) - Your physician has requested that you have an echocardiogram. Echocardiography is a painless test that uses sound waves to create images of your heart. It provides your doctor with information about the size and shape of your heart and how well your heart's chambers and valves are working. This procedure takes approximately one hour. There are no restrictions for this procedure.     Follow-Up: At [facility], you and your health needs are our priority.  As part of our continuing mission to provide you with exceptional heart care, we have created designated Provider Care Teams.  These Care Teams include your primary Cardiologist (physician) and Advanced Practice Providers (APPs -  Physician Assistants and Nurse Practitioners) who all work together to provide you with the care you need, when you need it.  We recommend signing up for the patient portal called "MyChart".  Sign up information is provided on this After Visit Summary.  MyChart is used to  connect with patients for Virtual Visits (Telemedicine).  Patients are able to view lab/test results, encounter notes, upcoming appointments, etc.  Non-urgent messages can be sent to your provider as well.   To learn  more about what you can do with MyChart, go to [email or web address].    Your next appointment:   12 month(s)  The format for your next appointment:   In Person  Provider:   Evalina Field, MD            Time Spent with Patient: I have spent a total of 25 minutes with patient reviewing hospital notes, telemetry, EKGs, labs and examining the patient as well as establishing an assessment and plan that was discussed with the patient.  > 50% of time was spent in direct patient care.  Signed, Addison Naegeli. Audie Box, MD, [place]  [address] [place], [place] [address] [phone]  03/12/2022 9:57 AM

## 2022-03-12 ENCOUNTER — Ambulatory Visit (INDEPENDENT_AMBULATORY_CARE_PROVIDER_SITE_OTHER): Payer: Medicare Other | Admitting: Cardiovascular Disease

## 2022-03-12 ENCOUNTER — Encounter: Payer: Self-pay | Admitting: Cardiovascular Disease

## 2022-03-12 VITALS — BP 112/68 | HR 82 | Ht 68.0 in | Wt 196.0 lb

## 2022-03-12 DIAGNOSIS — Z952 Presence of prosthetic heart valve: Secondary | ICD-10-CM | POA: Diagnosis not present

## 2022-03-12 DIAGNOSIS — I5032 Chronic diastolic (congestive) heart failure: Secondary | ICD-10-CM | POA: Diagnosis not present

## 2022-03-12 DIAGNOSIS — I340 Nonrheumatic mitral (valve) insufficiency: Secondary | ICD-10-CM

## 2022-03-12 NOTE — Patient Instructions (Signed)
Medication Instructions:  The current medical regimen is effective;  continue present plan and medications.  *If you need a refill on your cardiac medications before your next appointment, please call your pharmacy*     Testing/Procedures: Echocardiogram (12 month) - Your physician has requested that you have an echocardiogram. Echocardiography is a painless test that uses sound waves to create images of your heart. It provides your doctor with information about the size and shape of your heart and how well your heart's chambers and valves are working. This procedure takes approximately one hour. There are no restrictions for this procedure.     Follow-Up: At [facility], you and your health needs are our priority.  As part of our continuing mission to provide you with exceptional heart care, we have created designated Provider Care Teams.  These Care Teams include your primary Cardiologist (physician) and Advanced Practice Providers (APPs -  Physician Assistants and Nurse Practitioners) who all work together to provide you with the care you need, when you need it.  We recommend signing up for the patient portal called "MyChart".  Sign up information is provided on this After Visit Summary.  MyChart is used to connect with patients for Virtual Visits (Telemedicine).  Patients are able to view lab/test results, encounter notes, upcoming appointments, etc.  Non-urgent messages can be sent to your provider as well.   To learn more about what you can do with MyChart, go to [email or web address].    Your next appointment:   12 month(s)  The format for your next appointment:   In Person  Provider:   Latah T O'Neal, MD {        

## 2022-04-02 ENCOUNTER — Ambulatory Visit: Payer: Medicare Other | Admitting: Podiatry

## 2022-04-25 ENCOUNTER — Ambulatory Visit: Payer: Medicare Other | Admitting: Podiatry

## 2022-05-31 ENCOUNTER — Other Ambulatory Visit: Payer: Self-pay | Admitting: Physician Assistant

## 2022-05-31 ENCOUNTER — Telehealth: Payer: Self-pay | Admitting: Physician Assistant

## 2022-05-31 DIAGNOSIS — I5032 Chronic diastolic (congestive) heart failure: Secondary | ICD-10-CM

## 2022-05-31 NOTE — Telephone Encounter (Signed)
  [facility]   MULTIDISCIPLINARY HEART VALVE TEAM  Spoke to son last week about weight gain and we increased his torsemide to 40mg  daily. This has helped a lot. Weight able around 197lbs. Recently seen by PCP and creat normal but K 5.3. BNP mildly elevated around 266. Will stop Kdur and continue torsemide 40mg  daily and recheck BMET next week.  Angelena Form PA-C  [facility]

## 2022-06-09 LAB — BASIC METABOLIC PANEL
BUN/Creatinine Ratio: 24 (ref 10–24)
BUN: 26 mg/dL (ref 10–36)
CO2: 27 mmol/L (ref 20–29)
Calcium: 9.1 mg/dL (ref 8.6–10.2)
Chloride: 98 mmol/L (ref 96–106)
Creatinine, Ser: 1.07 mg/dL (ref 0.76–1.27)
Glucose: 108 mg/dL — ABNORMAL HIGH (ref 70–99)
Potassium: 4.8 mmol/L (ref 3.5–5.2)
Sodium: 141 mmol/L (ref 134–144)
eGFR: 65 mL/min/{1.73_m2} (ref >59)

## 2022-08-04 ENCOUNTER — Inpatient Hospital Stay (HOSPITAL_COMMUNITY)
Admission: EM | Admit: 2022-08-04 | Discharge: 2022-08-06 | DRG: 189 | Disposition: A | Discharge status: Home / Self Care | Payer: Medicare Other | Attending: Internal Medicine | Admitting: Internal Medicine

## 2022-08-04 ENCOUNTER — Encounter (HOSPITAL_COMMUNITY): Payer: Self-pay

## 2022-08-04 ENCOUNTER — Other Ambulatory Visit: Payer: Self-pay

## 2022-08-04 ENCOUNTER — Emergency Department (HOSPITAL_COMMUNITY): Payer: Medicare Other

## 2022-08-04 DIAGNOSIS — E875 Hyperkalemia: Secondary | ICD-10-CM | POA: Diagnosis not present

## 2022-08-04 DIAGNOSIS — I4819 Other persistent atrial fibrillation: Secondary | ICD-10-CM | POA: Diagnosis present

## 2022-08-04 DIAGNOSIS — Z7901 Long term (current) use of anticoagulants: Secondary | ICD-10-CM

## 2022-08-04 DIAGNOSIS — I5030 Unspecified diastolic (congestive) heart failure: Secondary | ICD-10-CM | POA: Diagnosis present

## 2022-08-04 DIAGNOSIS — N400 Enlarged prostate without lower urinary tract symptoms: Secondary | ICD-10-CM | POA: Diagnosis present

## 2022-08-04 DIAGNOSIS — I10 Essential (primary) hypertension: Secondary | ICD-10-CM | POA: Diagnosis not present

## 2022-08-04 DIAGNOSIS — D539 Nutritional anemia, unspecified: Secondary | ICD-10-CM | POA: Diagnosis not present

## 2022-08-04 DIAGNOSIS — I69351 Hemiplegia and hemiparesis following cerebral infarction affecting right dominant side: Secondary | ICD-10-CM

## 2022-08-04 DIAGNOSIS — Z1152 Encounter for screening for COVID-19: Secondary | ICD-10-CM

## 2022-08-04 DIAGNOSIS — I5032 Chronic diastolic (congestive) heart failure: Secondary | ICD-10-CM | POA: Diagnosis present

## 2022-08-04 DIAGNOSIS — I482 Chronic atrial fibrillation, unspecified: Secondary | ICD-10-CM | POA: Diagnosis present

## 2022-08-04 DIAGNOSIS — B348 Other viral infections of unspecified site: Secondary | ICD-10-CM | POA: Diagnosis not present

## 2022-08-04 DIAGNOSIS — Z952 Presence of prosthetic heart valve: Secondary | ICD-10-CM

## 2022-08-04 DIAGNOSIS — Z66 Do not resuscitate: Secondary | ICD-10-CM | POA: Diagnosis present

## 2022-08-04 DIAGNOSIS — J441 Chronic obstructive pulmonary disease with (acute) exacerbation: Secondary | ICD-10-CM | POA: Diagnosis present

## 2022-08-04 DIAGNOSIS — J431 Panlobular emphysema: Secondary | ICD-10-CM | POA: Diagnosis not present

## 2022-08-04 DIAGNOSIS — I110 Hypertensive heart disease with heart failure: Secondary | ICD-10-CM | POA: Diagnosis present

## 2022-08-04 DIAGNOSIS — Z79899 Other long term (current) drug therapy: Secondary | ICD-10-CM | POA: Diagnosis not present

## 2022-08-04 DIAGNOSIS — B9789 Other viral agents as the cause of diseases classified elsewhere: Secondary | ICD-10-CM | POA: Diagnosis present

## 2022-08-04 DIAGNOSIS — Z96653 Presence of artificial knee joint, bilateral: Secondary | ICD-10-CM | POA: Diagnosis present

## 2022-08-04 DIAGNOSIS — Z87891 Personal history of nicotine dependence: Secondary | ICD-10-CM

## 2022-08-04 DIAGNOSIS — J449 Chronic obstructive pulmonary disease, unspecified: Secondary | ICD-10-CM | POA: Diagnosis present

## 2022-08-04 DIAGNOSIS — J9601 Acute respiratory failure with hypoxia: Secondary | ICD-10-CM | POA: Diagnosis not present

## 2022-08-04 LAB — CBC WITH DIFFERENTIAL/PLATELET
Abs Immature Granulocytes: 0.03 10*3/uL (ref 0.00–0.07)
Basophils Absolute: 0.1 10*3/uL (ref 0.0–0.1)
Basophils Relative: 1 %
Eosinophils Absolute: 0.4 10*3/uL (ref 0.0–0.5)
Eosinophils Relative: 5 %
HCT: 35.6 % — ABNORMAL LOW (ref 39.0–52.0)
Hemoglobin: 11.8 g/dL — ABNORMAL LOW (ref 13.0–17.0)
Immature Granulocytes: 0 %
Lymphocytes Relative: 15 %
Lymphs Abs: 1.2 10*3/uL (ref 0.7–4.0)
MCH: 33.9 pg (ref 26.0–34.0)
MCHC: 33.1 g/dL (ref 30.0–36.0)
MCV: 102.3 fL — ABNORMAL HIGH (ref 80.0–100.0)
Monocytes Absolute: 0.8 10*3/uL (ref 0.1–1.0)
Monocytes Relative: 9 %
Neutro Abs: 5.6 10*3/uL (ref 1.7–7.7)
Neutrophils Relative %: 70 %
Platelets: 240 10*3/uL (ref 150–400)
RBC: 3.48 MIL/uL — ABNORMAL LOW (ref 4.22–5.81)
RDW: 16.1 % — ABNORMAL HIGH (ref 11.5–15.5)
WBC: 8 10*3/uL (ref 4.0–10.5)
nRBC: 0 % (ref 0.0–0.2)

## 2022-08-04 LAB — PROTIME-INR
INR: 1.3 — ABNORMAL HIGH (ref 0.8–1.2)
Prothrombin Time: 15.8 seconds — ABNORMAL HIGH (ref 11.4–15.2)

## 2022-08-04 LAB — BASIC METABOLIC PANEL
Anion gap: 7 (ref 5–15)
BUN: 23 mg/dL (ref 8–23)
CO2: 28 mmol/L (ref 22–32)
Calcium: 8.4 mg/dL — ABNORMAL LOW (ref 8.9–10.3)
Chloride: 99 mmol/L (ref 98–111)
Creatinine, Ser: 0.98 mg/dL (ref 0.61–1.24)
GFR, Estimated: >60 mL/min (ref >60)
Glucose, Bld: 103 mg/dL — ABNORMAL HIGH (ref 70–99)
Potassium: 4.1 mmol/L (ref 3.5–5.1)
Sodium: 134 mmol/L — ABNORMAL LOW (ref 135–145)

## 2022-08-04 LAB — RESPIRATORY PANEL BY PCR

## 2022-08-04 LAB — TSH: TSH: 0.893 u[IU]/mL (ref 0.350–4.500)

## 2022-08-04 LAB — BRAIN NATRIURETIC PEPTIDE: B Natriuretic Peptide: 385.4 pg/mL — ABNORMAL HIGH (ref 0.0–100.0)

## 2022-08-04 LAB — TROPONIN I (HIGH SENSITIVITY)
Troponin I (High Sensitivity): 13 ng/L (ref <18)
Troponin I (High Sensitivity): 15 ng/L (ref <18)

## 2022-08-04 LAB — RESP PANEL BY RT-PCR (RSV, FLU A&B, COVID)  RVPGX2
Influenza A by PCR: NEGATIVE
Influenza B by PCR: NEGATIVE
Resp Syncytial Virus by PCR: NEGATIVE
SARS Coronavirus 2 by RT PCR: NEGATIVE

## 2022-08-04 LAB — PROCALCITONIN: Procalcitonin: <0.1 ng/mL

## 2022-08-04 LAB — C-REACTIVE PROTEIN: CRP: 1.7 mg/dL — ABNORMAL HIGH (ref <1.0)

## 2022-08-04 MED ORDER — ACETAMINOPHEN 650 MG RE SUPP: 650.0000 mg | Freq: Four times a day (QID) | RECTAL | Status: DC | PRN

## 2022-08-04 MED ORDER — DICLOFENAC SODIUM 1 % EX GEL: 2.0000 g | Freq: Three times a day (TID) | CUTANEOUS | Status: DC | PRN

## 2022-08-04 MED ORDER — TORSEMIDE 20 MG PO TABS
40.0000 mg | ORAL_TABLET | Freq: Every day | ORAL | Status: DC
  Administered 2022-08-04 – 2022-08-06 (×3): 40 mg via ORAL
  Filled 2022-08-04 (×3): qty 2

## 2022-08-04 MED ORDER — ACETAMINOPHEN 325 MG PO TABS
650.0000 mg | ORAL_TABLET | Freq: Four times a day (QID) | ORAL | Status: DC | PRN
  Administered 2022-08-04 (×2): 650 mg via ORAL
  Filled 2022-08-04 (×2): qty 2

## 2022-08-04 MED ORDER — TAMSULOSIN HCL 0.4 MG PO CAPS
0.8000 mg | ORAL_CAPSULE | Freq: Every day | ORAL | Status: DC
  Administered 2022-08-04 – 2022-08-05 (×2): 0.8 mg via ORAL
  Filled 2022-08-04 (×2): qty 2

## 2022-08-04 MED ORDER — METHYLPREDNISOLONE SODIUM SUCC 125 MG IJ SOLR
125.0000 mg | Freq: Once | INTRAMUSCULAR | Status: AC
  Administered 2022-08-04: 125 mg via INTRAVENOUS
  Filled 2022-08-04: qty 2

## 2022-08-04 MED ORDER — METOPROLOL SUCCINATE ER 50 MG PO TB24
50.0000 mg | ORAL_TABLET | Freq: Every day | ORAL | Status: DC
  Administered 2022-08-04 – 2022-08-06 (×3): 50 mg via ORAL
  Filled 2022-08-04 (×3): qty 1

## 2022-08-04 MED ORDER — SODIUM CHLORIDE 0.9% FLUSH
3.0000 mL | Freq: Two times a day (BID) | INTRAVENOUS | Status: DC
  Administered 2022-08-04 – 2022-08-05 (×4): 3 mL via INTRAVENOUS

## 2022-08-04 MED ORDER — GUAIFENESIN ER 600 MG PO TB12
600.0000 mg | ORAL_TABLET | Freq: Two times a day (BID) | ORAL | Status: DC
  Administered 2022-08-04 – 2022-08-06 (×5): 600 mg via ORAL
  Filled 2022-08-04 (×5): qty 1

## 2022-08-04 MED ORDER — MONTELUKAST SODIUM 10 MG PO TABS
10.0000 mg | ORAL_TABLET | Freq: Every day | ORAL | Status: DC
  Administered 2022-08-04 – 2022-08-05 (×2): 10 mg via ORAL
  Filled 2022-08-04 (×2): qty 1

## 2022-08-04 MED ORDER — BENZONATATE 100 MG PO CAPS
200.0000 mg | ORAL_CAPSULE | Freq: Two times a day (BID) | ORAL | Status: DC | PRN
  Administered 2022-08-04 (×2): 200 mg via ORAL
  Filled 2022-08-04 (×2): qty 2

## 2022-08-04 MED ORDER — POTASSIUM CHLORIDE CRYS ER 10 MEQ PO TBCR
10.0000 meq | EXTENDED_RELEASE_TABLET | Freq: Every day | ORAL | Status: DC
  Administered 2022-08-04: 10 meq via ORAL
  Filled 2022-08-04: qty 1

## 2022-08-04 MED ORDER — ALBUTEROL SULFATE (2.5 MG/3ML) 0.083% IN NEBU
2.5000 mg | INHALATION_SOLUTION | RESPIRATORY_TRACT | Status: DC | PRN
  Administered 2022-08-04: 2.5 mg via RESPIRATORY_TRACT
  Filled 2022-08-04: qty 3

## 2022-08-04 MED ORDER — POLYVINYL ALCOHOL 1.4 % OP SOLN: 1.0000 [drp] | OPHTHALMIC | Status: DC | PRN

## 2022-08-04 MED ORDER — DUTASTERIDE 0.5 MG PO CAPS
0.5000 mg | ORAL_CAPSULE | Freq: Every day | ORAL | Status: DC
  Administered 2022-08-04 – 2022-08-06 (×3): 0.5 mg via ORAL
  Filled 2022-08-04 (×4): qty 1

## 2022-08-04 MED ORDER — APIXABAN 5 MG PO TABS
5.0000 mg | ORAL_TABLET | Freq: Two times a day (BID) | ORAL | Status: DC
  Administered 2022-08-04 – 2022-08-06 (×5): 5 mg via ORAL
  Filled 2022-08-04 (×5): qty 1

## 2022-08-04 MED ORDER — ALBUTEROL SULFATE HFA 108 (90 BASE) MCG/ACT IN AERS
2.0000 | INHALATION_SPRAY | RESPIRATORY_TRACT | Status: DC | PRN
  Filled 2022-08-04: qty 6.7

## 2022-08-04 NOTE — ED Notes (Signed)
Messaged md pt is c/o of coughing and is coughing dry hacking cough

## 2022-08-04 NOTE — ED Triage Notes (Signed)
Pt BIBA for c/o SOB for 2-3 days and persistent cough. Been taking mucinex and having productive cough d/t med. Unable to cough anything else up. EMS arrived and pt O2 sat was 78% on RA. Initially, pt was wheezing and received DuoNeb and placed on 4 LPM [place]. Pt on RA at baseline. HX COPD and CHF. Placed pt on 2.5 LPM on arrival O2 sat 94%. A&Ox4. Pt lives at [facility].

## 2022-08-04 NOTE — ED Provider Notes (Addendum)
[facility] EMERGENCY DEPARTMENT Provider Note   CSN: [id number] Arrival date & time: 08/04/22  0350     History  Chief Complaint  Patient presents with   Shortness of Breath    Ruben [name] is a 88 y.o. male.  88 y/o male from [place] with hx of COPD, CHF, AS s/p TAVR, PAF on chronic anticoagulation presents to the ED for SOB x 3 days. Symptoms have been constant and progressive. Began initially as DOE. Has had a cough with sensation of chest congestion which has been worsening. Was trying to "cough up the phlegm" tonight, but was unable to and called EMS. EMS reports O2 sats of 78% on RA on arrival. No hx of chronic O2 requirement. Received DuoNeb en route with some improvement. No associated fevers, known sick contacts, N/V, chest pain, syncope. Feels like he has had some weight gain recently, but is unable to quantify.   The history is provided by the patient. No language interpreter was used.  Shortness of Breath      Home Medications Prior to Admission medications   Medication Sig Start Date End Date Taking? Authorizing Provider  acetaminophen (TYLENOL) 500 MG tablet Take 1,000 mg by mouth in the morning, at noon, and at bedtime.    [provider]  amoxicillin (AMOXIL) 500 MG tablet Take 4 tablets (2,000 mg) one hour prior to all dental visits. 11/10/20   Janetta Hora, PA-C  apixaban (ELIQUIS) 5 MG TABS tablet Take 1 tablet (5 mg total) by mouth 2 (two) times daily. 10/23/21   O'Neal, Ronnald Ramp, MD  COVID-19 mRNA bivalent vaccine, Pfizer, (PFIZER COVID-19 VAC BIVALENT) injection Inject into the muscle. Patient not taking: Reported on 03/12/2022 04/24/21   Judyann Munson, MD  diclofenac Sodium (VOLTAREN) 1 % GEL Apply 1 application topically 3 (three) times daily as needed (to both knees for arthritis pain).    [provider]  dutasteride (AVODART) 0.5 MG capsule Take 0.5 mg by mouth in the morning and at bedtime. 05/21/20    [provider]  guaiFENesin (MUCINEX) 600 MG 12 hr tablet Take 1 tablet (600 mg total) by mouth 2 (two) times daily as needed for cough or to loosen phlegm. 11/05/20   Strader, Lennart Pall, PA-C  metoprolol succinate (TOPROL-XL) 50 MG 24 hr tablet Take 50 mg by mouth daily. 04/23/20   [provider]  montelukast (SINGULAIR) 10 MG tablet Take 10 mg by mouth at bedtime. 04/19/20   [provider]  polyethylene glycol (MIRALAX / GLYCOLAX) 17 g packet Take 17 g by mouth daily as needed for mild constipation. 07/19/20   Osvaldo Shipper, MD  polyvinyl alcohol (LIQUIFILM TEARS) 1.4 % ophthalmic solution Place 1 drop into both eyes as needed for dry eyes.    [provider]  senna-docusate (SENOKOT-S) 8.6-50 MG tablet Take 2 tablets by mouth daily.    [provider]  tamsulosin (FLOMAX) 0.4 MG CAPS capsule Take 0.8 mg by mouth at bedtime. 05/21/20   [provider]  torsemide (DEMADEX) 20 MG tablet Take 40 mg by mouth daily.    [provider]  traMADol (ULTRAM) 50 MG tablet Take 1 tablet (50 mg total) by mouth 2 (two) times daily as needed  for pain. 12/12/20     vitamin B-12 (CYANOCOBALAMIN) 500 MCG tablet Take 500 mcg by mouth daily.    [provider]      Allergies    Patient has no known allergies.  Review of Systems   Review of Systems  Respiratory:  Positive for shortness of breath.   Ten systems reviewed and are negative for acute change, except as noted in the HPI.    Physical Exam Updated Vital Signs BP 107/62   Pulse 83   Temp 97.9 F (36.6 C) (Oral)   Resp (!) 25   Ht 5\' 9"  (1.753 m)   Wt 82.6 kg   SpO2 97%   BMI 26.88 kg/m   Physical Exam Vitals and nursing note reviewed.  Constitutional:      General: He is not in acute distress.    Appearance: He is well-developed. He is not diaphoretic.     Comments: Ill appearing, but pleasant. Nontoxic.   HENT:     Head: Normocephalic and atraumatic.  Eyes:      General: No scleral icterus.    Conjunctiva/sclera: Conjunctivae normal.  Cardiovascular:     Rate and Rhythm: Normal rate. Rhythm irregular.     Pulses: Normal pulses.  Pulmonary:     Effort: Pulmonary effort is normal. No respiratory distress.     Breath sounds: No stridor. No wheezing.     Comments: Sats 93% on 3L via [place]. Slightly diminished in b/l bases. Otherwise grossly clear to auscultation. Musculoskeletal:        General: Normal range of motion.     Cervical back: Normal range of motion.     Comments: 1+ pitting edema BLE  Skin:    General: Skin is warm and dry.     Coloration: Skin is not pale.     Findings: No erythema or rash.  Neurological:     Mental Status: He is alert and oriented to person, place, and time.     Coordination: Coordination normal.  Psychiatric:        Behavior: Behavior normal.     ED Results / Procedures / Treatments   Labs (all labs ordered are listed, but only abnormal results are displayed) Labs Reviewed  PROTIME-INR - Abnormal; Notable for the following components:      Result Value   Prothrombin Time 15.8 (*)    INR 1.3 (*)    All other components within normal limits  BRAIN NATRIURETIC PEPTIDE - Abnormal; Notable for the following components:   B Natriuretic Peptide 385.4 (*)    All other components within normal limits  CBC WITH DIFFERENTIAL/PLATELET - Abnormal; Notable for the following components:   RBC 3.48 (*)    Hemoglobin 11.8 (*)    HCT 35.6 (*)    MCV 102.3 (*)    RDW 16.1 (*)    All other components within normal limits  BASIC METABOLIC PANEL - Abnormal; Notable for the following components:   Sodium 134 (*)    Glucose, Bld 103 (*)    Calcium 8.4 (*)    All other components within normal limits  RESP PANEL BY RT-PCR (RSV, FLU A&B, COVID)  RVPGX2  TROPONIN I (HIGH SENSITIVITY)  TROPONIN I (HIGH SENSITIVITY)    EKG   Radiology DG Chest 2 View  Result Date: 08/04/2022 CLINICAL DATA:  88 year old male with  cough, shortness of breath, chest congestion. Former smoker. EXAM: CHEST - 2 VIEW COMPARISON:  Chest radiographs 10/31/2020 and earlier. FINDINGS: Upright AP and lateral views at 0441 hours. Interval TAVR. Stable cardiomegaly and mediastinal contours. Calcified aortic atherosclerosis. Chronically large lung volumes. Regressed or resolved small pleural effusions seen last year. No pneumothorax. Pulmonary vascularity appears stable, some chronic pulmonary interstitial changes favored  to be smoking related. No consolidation. Previous left clavicle ORIF. Osteopenia. No acute osseous abnormality identified. Negative visible bowel gas. IMPRESSION: 1. Interval TAVR. Stable cardiomegaly. Aortic Atherosclerosis (ICD10-I70.0). 2. Regressed or resolved small pleural effusions seen last no acute cardiopulmonary abnormality identified. Electronically Signed   By: Odessa Fleming M.D.   On: 08/04/2022 05:56    Procedures .Critical Care  Performed by: Antony Madura, PA-C Authorized by: Antony Madura, PA-C   Critical care provider statement:    Critical care time (minutes):  35   Critical care time was exclusive of:  Separately billable procedures and treating other patients   Critical care was necessary to treat or prevent imminent or life-threatening deterioration of the following conditions:  Respiratory failure   Critical care was time spent personally by me on the following activities:  Development of treatment plan with patient or surrogate, discussions with consultants, evaluation of patient's response to treatment, examination of patient, ordering and review of laboratory studies, ordering and review of radiographic studies, ordering and performing treatments and interventions, pulse oximetry, re-evaluation of patient's condition and review of old charts   I assumed direction of critical care for this patient from another provider in my specialty: no       Medications Ordered in ED Medications  albuterol (VENTOLIN  HFA) 108 (90 Base) MCG/ACT inhaler 2 puff (has no administration in time range)  methylPREDNISolone sodium succinate (SOLU-MEDROL) 125 mg/2 mL injection 125 mg (has no administration in time range)    ED Course/ Medical Decision Making/ A&P                           Medical Decision Making Amount and/or Complexity of Data Reviewed Labs: ordered. Radiology: ordered.  Risk Prescription drug management. Decision regarding hospitalization.   This patient presents to the ED for concern of SOB, this involves an extensive number of treatment options, and is a complaint that carries with it a high risk of complications and morbidity.  The differential diagnosis includes COPD exacerbation vs CHF exacerbation vs ACS vs viral illness vs PTX   Co morbidities that complicate the patient evaluation  COPD CHF PAF Chronic anticoagulation   Additional history obtained:  Additional history obtained from EMS External records from outside source obtained and reviewed including prior BNP of 498.5<717.2<487.9   Lab Tests:  I Ordered, and personally interpreted labs.  The pertinent results include:  BNP of 385.4 (improved from 1 year ago), initial troponin of 13. INR 1.3 (patient on Eliquis).   Imaging Studies ordered:  I ordered imaging studies including CXR  I independently visualized and interpreted imaging which showed interval TAVR and improving pleural effusions. No acute process noted. I agree with the radiologist interpretation   Cardiac Monitoring:  The patient was maintained on a cardiac monitor.  I personally viewed and interpreted the cardiac monitored which showed an underlying rhythm of: Afib, regular rate   Medicines ordered and prescription drug management:  I ordered medication including albuterol and solumedrol for SOB  Reevaluation of the patient after these medicines showed that the patient improved I have reviewed the patients home medicines and have made  adjustments as needed   Test Considered:  CTA chest   Critical Interventions:  Patient on supplemental O2 via [place]   Problem List / ED Course:  Patient without chest pain, c/o progressive DOE x 3 days which worsened tonight. Hypoxic to 70's with EMS prior to transport. Currently comfortable on 3L via  [place] with sats in the low to mid 90's.  Question viral etiology - flu or COVID? Patient does not appear fluid overloaded. BNP improved compared to 1 year ago. No evidence of CHF exacerbation on CXR. Symptoms sound atypical for ACS. Initial troponin negative. Not in Afib with RVR COPD exacerbation possible. Given Solumedrol and albuterol in the ED pending flu and COVID results.   Reevaluation:  After the interventions noted above, I reevaluated the patient and found that they have : remained stable   Dispostion:  Patient will necessitate admission for management of acute respiratory failure with hypoxia. He will be assessed by the AM hospitalist provider.     Final Clinical Impression(s) / ED Diagnoses Final diagnoses:  Acute respiratory failure with hypoxia [facility])    Rx / DC Orders ED Discharge Orders     None         Antony Madura, PA-C 08/04/22 0645    Antony Madura, PA-C 08/04/22 0645    Nira Conn, MD 08/06/22 0800

## 2022-08-04 NOTE — H&P (Signed)
History and Physical    Patient: Ruben [name] WSF:[id number] DOB: 05-28-1927 DOA: 08/04/2022 DOS: the patient was seen and examined on 08/04/2022 PCP: Clovis Riley, L.August Saucer, MD  Patient coming from: [facility] via EMS  Chief Complaint:  Chief Complaint  Patient presents with   Shortness of Breath   HPI: Ruben [name] is a 88 y.o. male with medical history significant of diastolic CHF, AS s/p TAVR, persistent atrial fibrillation, COPD who presents with complaints of progressively worsening shortness of breath over the last 2 days.  At baseline patient is not on oxygen and cares for his wife who previously had a stroke with residual right-sided weakness at [employer].  He complains of having congestion and cough, but was having a difficult time coughing up any sputum.  He had been taking Mucinex and other over-the-counter cough medicines without relief.  Patient reported being easily winded with any exertion.  Denies having any significant fever, wheezing, abdominal pain, nausea, vomiting, or diarrhea to his knowledge.  Overnight patient reported worsening shortness of breath for which he called EMS.  With EMS patient is O2 saturations were 78% on room air.  Patient was wheezing and received DuoNeb breathing treatment and placed on 4 L of nasal cannula oxygen.  In the emergency department patient was noted to be afebrile with tachypnea, and O2 sat currently maintained on 2-3 L of oxygen greater than 92%.  Labs noted WBC 8, hemoglobin 11.8, sodium 134, BUN 23, creatinine 0.98, calcium 8.4, and BNP 385.4.  Chest x-ray noted stable cardiomegaly and regressed or resolved small pleural effusion and no other acute abnormality appreciated.  Influenza, COVID-19, and RSV screening were negative.  Patient had been given 125 mg of Solu-Medrol IV.   Review of Systems: As mentioned in the history of present illness. All other systems reviewed and are negative. 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    Severe aortic stenosis    Past Surgical History:  Procedure Laterality Date   APPENDECTOMY     BUBBLE STUDY  09/07/2020   Procedure: BUBBLE STUDY;  Surgeon: Sande Rives, MD;  Location: [facility] ENDOSCOPY;  Service: Cardiovascular;;   CARDIAC CATHETERIZATION     CLAVICLE SURGERY     INTRAMEDULLARY (IM) NAIL INTERTROCHANTERIC Left 09/20/2020   Procedure: INTRAMEDULLARY (IM) NAIL INTERTROCHANTRIC;  Surgeon: Sheral Apley, MD;  Location: [facility] OR;  Service: Orthopedics;  Laterality: Left;   RIGHT/LEFT HEART CATH AND CORONARY ANGIOGRAPHY N/A 08/24/2020   Procedure: RIGHT/LEFT HEART CATH AND CORONARY ANGIOGRAPHY;  Surgeon: Tonny Bollman, MD;  Location: [facility] INVASIVE CV LAB;  Service: Cardiovascular;  Laterality: N/A;   TEE WITHOUT CARDIOVERSION N/A 09/07/2020   Procedure: TRANSESOPHAGEAL ECHOCARDIOGRAM (TEE);  Surgeon: Sande Rives, MD;  Location: [facility] ENDOSCOPY;  Service: Cardiovascular;  Laterality: N/A;   TOTAL KNEE ARTHROPLASTY Bilateral    TRANSCATHETER AORTIC VALVE REPLACEMENT, TRANSFEMORAL N/A 11/01/2020   Procedure: TRANSCATHETER AORTIC VALVE REPLACEMENT, TRANSFEMORAL;  Surgeon: Tonny Bollman, MD;  Location: [facility] INVASIVE CV LAB;  Service: Open Heart Surgery;  Laterality: N/A;   Social History:  reports that he quit smoking about 34  years ago. His smoking use included cigarettes. He has a 30.00 pack-year smoking history. He has never used smokeless tobacco. He reports that he does not currently use alcohol. He reports that he does not use drugs.  No Known Allergies  History reviewed. No pertinent family history.  Prior to Admission medications    Medication Sig Start Date End Date Taking? Authorizing Provider  acetaminophen (TYLENOL) 500 MG tablet Take 1,000 mg by mouth in the morning, at noon, and at bedtime.    [provider]  amoxicillin (AMOXIL) 500 MG tablet Take 4 tablets (2,000 mg) one hour prior to all dental visits. 11/10/20   Janetta Hora, PA-C  apixaban (ELIQUIS) 5 MG TABS tablet Take 1 tablet (5 mg total) by mouth 2 (two) times daily. 10/23/21   O'Neal, Ronnald Ramp, MD  COVID-19 mRNA bivalent vaccine, Pfizer, (PFIZER COVID-19 VAC BIVALENT) injection Inject into the muscle. Patient not taking: Reported on 03/12/2022 04/24/21   Judyann Munson, MD  diclofenac Sodium (VOLTAREN) 1 % GEL Apply 1 application topically 3 (three) times daily as needed (to both knees for arthritis pain).    [provider]  dutasteride (AVODART) 0.5 MG capsule Take 0.5 mg by mouth in the morning and at bedtime. 05/21/20   [provider]  guaiFENesin (MUCINEX) 600 MG 12 hr tablet Take 1 tablet (600 mg total) by mouth 2 (two) times daily as needed for cough or to loosen phlegm. 11/05/20   Strader, Lennart Pall, PA-C  metoprolol succinate (TOPROL-XL) 50 MG 24 hr tablet Take 50 mg by mouth daily. 04/23/20   [provider]  montelukast (SINGULAIR) 10 MG tablet Take 10 mg by mouth at bedtime. 04/19/20   [provider]  polyethylene glycol (MIRALAX / GLYCOLAX) 17 g packet Take 17 g by mouth daily as needed for mild constipation. 07/19/20   Osvaldo Shipper, MD  polyvinyl alcohol (LIQUIFILM TEARS) 1.4 % ophthalmic solution Place 1 drop into both eyes as needed for dry eyes.    [provider]  senna-docusate (SENOKOT-S) 8.6-50 MG tablet Take 2 tablets by mouth daily.    [provider]  tamsulosin (FLOMAX) 0.4 MG CAPS capsule Take 0.8 mg by mouth at bedtime. 05/21/20   [provider]  torsemide (DEMADEX) 20 MG tablet Take 40 mg by mouth daily.    [provider]  traMADol (ULTRAM)  50 MG tablet Take 1 tablet (50 mg total) by mouth 2 (two) times daily as needed  for pain. 12/12/20     vitamin B-12 (CYANOCOBALAMIN) 500 MCG tablet Take 500 mcg by mouth daily.    [provider]    Physical Exam: Vitals:   08/04/22 0520 08/04/22 0600 08/04/22 0717 08/04/22 0738  BP: 107/62 125/66 (!) 136/93   Pulse: 83 71 90   Resp: (!) 25 16 (!) 24   Temp:    97.7 F (36.5 C)  TempSrc:    Oral  SpO2: 97% 92% 98%   Weight:      Height:       Exam  Constitutional: Elderly male currently appears to be short of breath Eyes: PERRL, lids and conjunctivae normal ENMT: Mucous membranes are moist.  Hard of hearing Neck: normal, supple, no masses, no JVD appreciated. Respiratory: Decreased overall aeration without significant wheezes or rhonchi appreciated.  Patient on 2 L nasal cannula oxygen with O2 saturations maintained. Cardiovascular: Irregular irregular with positive systolic murmur present..  1+ pitting bilateral lower extremity edema. Abdomen: Protuberant abdomen without tenderness to  palpation.  Bowel sounds positive.  Musculoskeletal: no clubbing / cyanosis.  Good ROM, no contractures. Normal muscle tone.  Skin: no rashes, lesions, ulcers. No induration Neurologic: CN 2-12 grossly intact.  Patient able to move all extremities. Psychiatric: Normal judgment and insight. Alert and oriented x 3. Normal mood.   Data Reviewed:  EKG revealed atrial fibrillation at 95 bpm.  Reviewed labs, imaging and pertinent records as noted above in HPI.  Assessment and Plan:  Acute respiratory failure with hypoxia secondary to rhinovirus  Patient presents with reports of cough and worsening shortness of breath over the last 2 days.  O2 saturations reported to be as low as 78% on room air with improvement on 4 L nasal cannula oxygen.  On physical exam patient without significant wheezes or rhonchi appreciated at this time.  Chest x-ray noted regressed or resolved small pleural effusions  with no acute cardiopulmonary abnormality identified.  Complete respiratory virus panel positive for rhino/enterovirus. -Admit to a telemetry bed -Continuous pulse oximetry with nasal cannula oxygen to maintain O2 saturation greater than 92%.  Wean as tolerated -Incentive spirometry and flutter valve -Check pulse oximetry with ambulation in a.m. -Check procalcitonin -Mucinex  COPD, without acute exacerbation Patient had reportedly been wheezing prior to arrival and had given DuoNeb.  In the emergency department patient had been given 125 mg of Solu-Medrol IV.  He does not appear to be actively wheezing on physical at this time. -Albuterol nebs as needed for shortness of breath/wheezing -Assess and determine need of continued steroids a.m.  Chronic atrial fibrillation on chronic anticoagulation Patient currently appears to be rate controlled. -Continue metoprolol and Eliquis  Heart failure with preserved EF Chronic.  On physical exam patient with no significant JVD and 1+ pitting edema of the bilateral lower extremities.  Last echocardiogram in field EF to be 60 to 65%.  BNP was noted to be lower than priors at 385.4 -Strict I&Os and daily weights -Continue beta-blocker and torsemide  Essential hypertension Blood pressures range from 107/62-140/78. -Continue current home blood pressure regimen as tolerated  Severe aortic stenosis s/p TAVR  Macrocytic anemia Hemoglobin 11.8 g/dL which lower than prior in March of 14.5 g/dL.  Patient denied any reports of bleeding. -Recheck CBC  BPH -Continue Flomax and Avodart  DVT prophylaxis: Eliquis Advance Care Planning:   Code Status: DNR.  Confirmed with patient present at bedside  Consults: None  Family Communication: Called patient's son Lawton Dollinger  Severity of Illness: The appropriate patient status for this patient is INPATIENT. Inpatient status is judged to be reasonable and necessary in order to provide the required intensity of  service to ensure the patient's safety. The patient's presenting symptoms, physical exam findings, and initial radiographic and laboratory data in the context of their chronic comorbidities is felt to place them at high risk for further clinical deterioration. Furthermore, it is not anticipated that the patient will be medically stable for discharge from the hospital within 2 midnights of admission.   * I certify that at the point of admission it is my clinical judgment that the patient will require inpatient hospital care spanning beyond 2 midnights from the point of admission due to high intensity of service, high risk for further deterioration and high frequency of surveillance required.*  Author: Clydie Braun, MD 08/04/2022 8:41 AM  For on call review [email or web address].

## 2022-08-05 DIAGNOSIS — J9601 Acute respiratory failure with hypoxia: Secondary | ICD-10-CM | POA: Diagnosis not present

## 2022-08-05 LAB — CBC
HCT: 37.6 % — ABNORMAL LOW (ref 39.0–52.0)
Hemoglobin: 12.4 g/dL — ABNORMAL LOW (ref 13.0–17.0)
MCH: 33.2 pg (ref 26.0–34.0)
MCHC: 33 g/dL (ref 30.0–36.0)
MCV: 100.8 fL — ABNORMAL HIGH (ref 80.0–100.0)
Platelets: 238 10*3/uL (ref 150–400)
RBC: 3.73 MIL/uL — ABNORMAL LOW (ref 4.22–5.81)
RDW: 15.6 % — ABNORMAL HIGH (ref 11.5–15.5)
WBC: 6 10*3/uL (ref 4.0–10.5)
nRBC: 0 % (ref 0.0–0.2)

## 2022-08-05 LAB — BASIC METABOLIC PANEL
Anion gap: 9 (ref 5–15)
BUN: 23 mg/dL (ref 8–23)
CO2: 25 mmol/L (ref 22–32)
Calcium: 8.7 mg/dL — ABNORMAL LOW (ref 8.9–10.3)
Chloride: 100 mmol/L (ref 98–111)
Creatinine, Ser: 0.96 mg/dL (ref 0.61–1.24)
GFR, Estimated: >60 mL/min (ref >60)
Glucose, Bld: 131 mg/dL — ABNORMAL HIGH (ref 70–99)
Potassium: 5.2 mmol/L — ABNORMAL HIGH (ref 3.5–5.1)
Sodium: 134 mmol/L — ABNORMAL LOW (ref 135–145)

## 2022-08-05 MED ORDER — BUDESONIDE 0.25 MG/2ML IN SUSP
0.2500 mg | Freq: Two times a day (BID) | RESPIRATORY_TRACT | Status: DC
  Administered 2022-08-05 – 2022-08-06 (×3): 0.25 mg via RESPIRATORY_TRACT
  Filled 2022-08-05 (×3): qty 2

## 2022-08-05 NOTE — Evaluation (Signed)
Physical Therapy Evaluation Patient Details Name: Ruben [name] MRN: [id number] DOB: 1926-08-31 Today's Date: 08/05/2022  History of Present Illness  Pt is a 88 y.o. M who presents 08/04/2022 with increased SOB. Admitted with acute respiratory failure with hypoxia secondary to rhinovirus. Significant PMH: diastolic CHF, AS s/p TAVR, persistent atrial fibrillation, COPD.  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 Pt presents with decreased cardiopulmonary endurance in comparison to his baseline. Pt ambulating 120 ft with no assistive device or physical assist. Desat to 86% on RA sitting EOB, ~92% on 4L O2 during ambulation (poor pleth despite switching out pulse oximeter). Will continue to progress mobility as tolerated.     Recommendations for follow up therapy are one component of a multi-disciplinary discharge planning process, led by the attending physician.  Recommendations may be updated based on patient status, additional functional criteria and insurance authorization.  Follow Up Recommendations No PT follow up      Assistance Recommended at Discharge Intermittent Supervision/Assistance  Patient can return home with the following  Assistance with cooking/housework;Assist for transportation    Equipment Recommendations None recommended by PT  Recommendations for Other Services       Functional Status Assessment Patient has had a recent decline in their functional status and demonstrates the ability to make significant improvements in function in a reasonable and predictable amount of time.     Precautions / Restrictions Precautions Precautions: Fall;Other (comment) Precaution Comments: watch O2 Restrictions Weight Bearing Restrictions: No      Mobility  Bed Mobility Overal bed mobility: Modified Independent                  Transfers Overall transfer level: Independent Equipment used:  None                    Ambulation/Gait Ambulation/Gait assistance: Supervision Gait Distance (Feet): 120 Feet Assistive device: None Gait Pattern/deviations: Step-through pattern, Decreased stride length, Decreased dorsiflexion - right, Decreased dorsiflexion - left Gait velocity: decreased Gait velocity interpretation: <1.8 ft/sec, indicate of risk for recurrent falls   General Gait Details: Slow and steady pace, cues for activity pacing  Stairs            Wheelchair Mobility    Modified Rankin (Stroke Patients Only)       Balance Overall balance assessment: Needs assistance Sitting-balance support: Feet supported Sitting balance-Leahy Scale: Good     Standing balance support: No upper extremity supported, During functional activity Standing balance-Leahy Scale: Good                               Pertinent Vitals/Pain Pain Assessment Pain Assessment: No/denies pain    Home Living Family/patient expects to be discharged to:: Private residence Living Arrangements: Spouse/significant other   Type of Home: Independent living facility             Additional Comments: [facility]    Prior Function Prior Level of Function : Independent/Modified Independent             Mobility Comments: 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       Hand Dominance        Extremity/Trunk Assessment   Upper Extremity Assessment Upper Extremity Assessment: Overall WFL for tasks assessed    Lower Extremity Assessment Lower Extremity Assessment: Overall WFL for tasks assessed  Communication   Communication: HOH  Cognition Arousal/Alertness: Awake/alert Behavior During Therapy: WFL for tasks assessed/performed Overall Cognitive Status: Within Functional Limits for tasks assessed                                          General Comments      Exercises     Assessment/Plan    PT Assessment Patient  needs continued PT services  PT Problem List Decreased strength;Decreased activity tolerance;Decreased balance;Decreased mobility;Cardiopulmonary status limiting activity       PT Treatment Interventions Gait training;Functional mobility training;Therapeutic activities;Therapeutic exercise;Balance training;Patient/family education    PT Goals (Current goals can be found in the Care Plan section)  Acute Rehab PT Goals Patient Stated Goal: get back to wife PT Goal Formulation: With patient Time For Goal Achievement: 08/19/22 Potential to Achieve Goals: Good    Frequency Min 3X/week     Co-evaluation               AM-PAC PT "6 Clicks" Mobility  Outcome Measure Help needed turning from your back to your side while in a flat bed without using bedrails?: None Help needed moving from lying on your back to sitting on the side of a flat bed without using bedrails?: None Help needed moving to and from a bed to a chair (including a wheelchair)?: None Help needed standing up from a chair using your arms (e.g., wheelchair or bedside chair)?: A Little Help needed to walk in hospital room?: A Little Help needed climbing 3-5 steps with a railing? : A Little 6 Click Score: 21    End of Session Equipment Utilized During Treatment: Oxygen Activity Tolerance: Patient tolerated treatment well Patient left: in chair;with call bell/phone within reach Nurse Communication: Mobility status PT Visit Diagnosis: Difficulty in walking, not elsewhere classified (R26.2)    Time: 0263-7858 PT Time Calculation (min) (ACUTE ONLY): 33 min   Charges:   PT Evaluation $PT Eval Low Complexity: 1 Low PT Treatments $Therapeutic Activity: 8-22 mins        Ruben [name], PT, DPT Acute Rehabilitation Services Office [phone]   Ruben [name] 08/05/2022, 10:57 AM

## 2022-08-05 NOTE — Progress Notes (Signed)
PROGRESS NOTE    Ruben [name]  UDJ:[id number] DOB: February 16, 1927 DOA: 08/04/2022 PCP: Clovis Riley, L.August Saucer, MD   Brief Narrative:  Ruben [name] is a 88 y.o. male with medical history significant of diastolic CHF, AS s/p TAVR, persistent atrial fibrillation, COPD who presents with complaints of progressively worsening shortness of breath over the last 2 days.  Patient was admitted with acute hypoxemic respiratory failure secondary to rhinovirus infection and is requiring 4 L nasal cannula.  He is also noted to have associated acute COPD exacerbation-but this appears to be improving.  He appears to be feeling better today and needs further weaning of his oxygen prior to discharge.  Assessment & Plan:   Principal Problem:   Acute respiratory failure with hypoxia (HCC) Active Problems:   Rhinovirus infection   COPD (chronic obstructive pulmonary disease) (HCC)   Atrial fibrillation, chronic (HCC)   Heart failure with preserved ejection fraction (HCC)   Essential hypertension   S/P TAVR (transcatheter aortic valve replacement)   Macrocytic anemia   BPH (benign prostatic hyperplasia)  Assessment and Plan:  Acute respiratory failure with hypoxia secondary to rhinovirus  Patient presents with reports of cough and worsening shortness of breath over the last 2 days.  O2 saturations reported to be as low as 78% on room air with improvement on 4 L nasal cannula oxygen.  On physical exam patient without significant wheezes or rhonchi appreciated at this time.  Chest x-ray noted regressed or resolved small pleural effusions with no acute cardiopulmonary abnormality identified.  Complete respiratory virus panel positive for rhino/enterovirus. -Admit to a telemetry bed -Continuous pulse oximetry with nasal cannula oxygen to maintain O2 saturation greater than 92%.  Wean as tolerated to room air -Incentive spirometry and flutter valve -Ambulation in room -Procalcitonin low, no need for  antibiotics -Mucinex   COPD, without acute exacerbation Patient had reportedly been wheezing prior to arrival and had given DuoNeb.  In the emergency department patient had been given 125 mg of Solu-Medrol IV.  He does not appear to be actively wheezing on physical at this time. -Albuterol nebs as needed for shortness of breath/wheezing -Plan to continue Pulmicort twice daily through today, but no further need for systemic steroids noted   Chronic atrial fibrillation on chronic anticoagulation Patient currently appears to be rate controlled. -Continue metoprolol and Eliquis  Mild hyperkalemia -Hold potassium supplementation -Follow a.m. labs   Heart failure with preserved EF Chronic.  On physical exam patient with no significant JVD and 1+ pitting edema of the bilateral lower extremities.  Last echocardiogram in field EF to be 60 to 65%.  BNP was noted to be lower than priors at 385.4 -Strict I&Os and daily weights -Continue beta-blocker and torsemide   Essential hypertension Blood pressures range from 107/62-140/78. -Continue current home blood pressure regimen as tolerated   Severe aortic stenosis s/p TAVR   Macrocytic anemia Hemoglobin 11.8 g/dL which lower than prior in March of 14.5 g/dL.  Patient denied any reports of bleeding. -Recheck CBC   BPH -Continue Flomax and Avodart   DVT prophylaxis: Eliquis Code Status: DNR Family Communication: None at bedside Disposition Plan:  Status is: Inpatient Remains inpatient appropriate because: Need for IV medications.   Consultants:  None  Procedures:  None  Antimicrobials:  None   Subjective: Patient seen and evaluated today with ongoing slight cough and minimal wheezing.  States that he is feeling better today.  Objective: Vitals:   08/04/22 1621 08/04/22 2015 08/04/22 2300 08/05/22 0300  BP: 121/70 (!) 120/59 109/61 115/67  Pulse: 97 77 62 (!) 52  Resp: 20 19 20 16   Temp: 97.6 F (36.4 C)  (!) 97.5 F (36.4  C) (!) 97.5 F (36.4 C)  TempSrc: Oral Oral Oral Oral  SpO2: 90% 98% 99% 99%  Weight:      Height:        Intake/Output Summary (Last 24 hours) at 08/05/2022 0843 Last data filed at 08/05/2022 0700 Gross per 24 hour  Intake 240 ml  Output 1300 ml  Net -1060 ml   Filed Weights   08/04/22 0354  Weight: 82.6 kg    Examination:  General exam: Appears calm and comfortable  Respiratory system: Clear to auscultation. Respiratory effort normal.  4 L nasal cannula oxygen Cardiovascular system: S1 & S2 heard, RRR.  Gastrointestinal system: Abdomen is soft Central nervous system: Alert and awake Extremities: No edema Skin: No significant lesions noted Psychiatry: Flat affect.    Data Reviewed: I have personally reviewed following labs and imaging studies  CBC: Recent Labs  Lab 08/04/22 0425 08/05/22 0056  WBC 8.0 6.0  NEUTROABS 5.6  --   HGB 11.8* 12.4*  HCT 35.6* 37.6*  MCV 102.3* 100.8*  PLT 240 238   Basic Metabolic Panel: Recent Labs  Lab 08/04/22 0425 08/05/22 0056  NA 134* 134*  K 4.1 5.2*  CL 99 100  CO2 28 25  GLUCOSE 103* 131*  BUN 23 23  CREATININE 0.98 0.96  CALCIUM 8.4* 8.7*   GFR: Estimated Creatinine Clearance: 48.1 mL/min (by C-G formula based on SCr of 0.96 mg/dL). Liver Function Tests: No results for input(s): "AST", "ALT", "ALKPHOS", "BILITOT", "PROT", "ALBUMIN" in the last 168 hours. No results for input(s): "LIPASE", "AMYLASE" in the last 168 hours. No results for input(s): "AMMONIA" in the last 168 hours. Coagulation Profile: Recent Labs  Lab 08/04/22 0425  INR 1.3*   Cardiac Enzymes: No results for input(s): "CKTOTAL", "CKMB", "CKMBINDEX", "TROPONINI" in the last 168 hours. BNP (last 3 results) No results for input(s): "PROBNP" in the last 8760 hours. HbA1C: No results for input(s): "HGBA1C" in the last 72 hours. CBG: No results for input(s): "GLUCAP" in the last 168 hours. Lipid Profile: No results for input(s): "CHOL",  "HDL", "LDLCALC", "TRIG", "CHOLHDL", "LDLDIRECT" in the last 72 hours. Thyroid Function Tests: Recent Labs    08/04/22 0425  TSH 0.893   Anemia Panel: No results for input(s): "VITAMINB12", "FOLATE", "FERRITIN", "TIBC", "IRON", "RETICCTPCT" in the last 72 hours. Sepsis Labs: Recent Labs  Lab 08/04/22 0428  PROCALCITON <0.10    Recent Results (from the past 240 hour(s))  Resp panel by RT-PCR (RSV, Flu A&B, Covid) Anterior Nasal Swab     Status: None   Collection Time: 08/04/22  4:31 AM   Specimen: Anterior Nasal Swab  Result Value Ref Range Status   SARS Coronavirus 2 by RT PCR NEGATIVE NEGATIVE Final    Comment: (NOTE) SARS-CoV-2 target nucleic acids are NOT DETECTED.  The SARS-CoV-2 RNA is generally detectable in upper respiratory specimens during the acute phase of infection. The lowest concentration of SARS-CoV-2 viral copies this assay can detect is 138 copies/mL. A negative result does not preclude SARS-Cov-2 infection and should not be used as the sole basis for treatment or other patient management decisions. A negative result may occur with  improper specimen collection/handling, submission of specimen other than nasopharyngeal swab, presence of viral mutation(s) within the areas targeted by this assay, and inadequate number of viral copies(<138 copies/mL). A  negative result must be combined with clinical observations, patient history, and epidemiological information. The expected result is Negative.  Fact Sheet for Patients:  BloggerCourse.comhttps://www.fda.gov/media/152166/download  Fact Sheet for Healthcare Providers:  SeriousBroker.ithttps://www.fda.gov/media/152162/download  This test is no t yet approved or cleared by the [place] FDA and  has been authorized for detection and/or diagnosis of SARS-CoV-2 by FDA under an Emergency Use Authorization (EUA). This EUA will remain  in effect (meaning this test can be used) for the duration of the COVID-19 declaration under Section  564(b)(1) of the Act, 21 U.S.C.section 360bbb-3(b)(1), unless the authorization is terminated  or revoked sooner.       Influenza A by PCR NEGATIVE NEGATIVE Final   Influenza B by PCR NEGATIVE NEGATIVE Final    Comment: (NOTE) The Xpert Xpress SARS-CoV-2/FLU/RSV plus assay is intended as an aid in the diagnosis of influenza from Nasopharyngeal swab specimens and should not be used as a sole basis for treatment. Nasal washings and aspirates are unacceptable for Xpert Xpress SARS-CoV-2/FLU/RSV testing.  Fact Sheet for Patients: BloggerCourse.comhttps://www.fda.gov/media/152166/download  Fact Sheet for Healthcare Providers: SeriousBroker.ithttps://www.fda.gov/media/152162/download  This test is not yet approved or cleared by the [place] FDA and has been authorized for detection and/or diagnosis of SARS-CoV-2 by FDA under an Emergency Use Authorization (EUA). This EUA will remain in effect (meaning this test can be used) for the duration of the COVID-19 declaration under Section 564(b)(1) of the Act, 21 U.S.C. section 360bbb-3(b)(1), unless the authorization is terminated or revoked.     Resp Syncytial Virus by PCR NEGATIVE NEGATIVE Final    Comment: (NOTE) Fact Sheet for Patients: BloggerCourse.comhttps://www.fda.gov/media/152166/download  Fact Sheet for Healthcare Providers: SeriousBroker.ithttps://www.fda.gov/media/152162/download  This test is not yet approved or cleared by the [place] FDA and has been authorized for detection and/or diagnosis of SARS-CoV-2 by FDA under an Emergency Use Authorization (EUA). This EUA will remain in effect (meaning this test can be used) for the duration of the COVID-19 declaration under Section 564(b)(1) of the Act, 21 U.S.C. section 360bbb-3(b)(1), unless the authorization is terminated or revoked.  Performed at [facility], [address] [place], [place] [address]   Respiratory (~20 pathogens) panel by PCR     Status: Abnormal   Collection Time: 08/04/22  7:37 AM   Specimen:  Nasopharyngeal Swab; Respiratory  Result Value Ref Range Status   Adenovirus NOT DETECTED NOT DETECTED Final   Coronavirus 229E NOT DETECTED NOT DETECTED Final    Comment: (NOTE) The Coronavirus on the Respiratory Panel, DOES NOT test for the novel  Coronavirus (2019 nCoV)    Coronavirus HKU1 NOT DETECTED NOT DETECTED Final   Coronavirus NL63 NOT DETECTED NOT DETECTED Final   Coronavirus OC43 NOT DETECTED NOT DETECTED Final   Metapneumovirus NOT DETECTED NOT DETECTED Final   Rhinovirus / Enterovirus DETECTED (A) NOT DETECTED Final   Influenza A NOT DETECTED NOT DETECTED Final   Influenza B NOT DETECTED NOT DETECTED Final   Parainfluenza Virus 1 NOT DETECTED NOT DETECTED Final   Parainfluenza Virus 2 NOT DETECTED NOT DETECTED Final   Parainfluenza Virus 3 NOT DETECTED NOT DETECTED Final   Parainfluenza Virus 4 NOT DETECTED NOT DETECTED Final   Respiratory Syncytial Virus NOT DETECTED NOT DETECTED Final   Bordetella pertussis NOT DETECTED NOT DETECTED Final   Bordetella Parapertussis NOT DETECTED NOT DETECTED Final   Chlamydophila pneumoniae NOT DETECTED NOT DETECTED Final   Mycoplasma pneumoniae NOT DETECTED NOT DETECTED Final    Comment: Performed at [facility],  [address], [place], [place] [address]         Radiology Studies: DG Chest 2 View  Result Date: 08/04/2022 CLINICAL DATA:  88 year old male with cough, shortness of breath, chest congestion. Former smoker. EXAM: CHEST - 2 VIEW COMPARISON:  Chest radiographs 10/31/2020 and earlier. FINDINGS: Upright AP and lateral views at 0441 hours. Interval TAVR. Stable cardiomegaly and mediastinal contours. Calcified aortic atherosclerosis. Chronically large lung volumes. Regressed or resolved small pleural effusions seen last year. No pneumothorax. Pulmonary vascularity appears stable, some chronic pulmonary interstitial changes favored to be smoking related. No consolidation. Previous left clavicle ORIF. Osteopenia. No  acute osseous abnormality identified. Negative visible bowel gas. IMPRESSION: 1. Interval TAVR. Stable cardiomegaly. Aortic Atherosclerosis (ICD10-I70.0). 2. Regressed or resolved small pleural effusions seen last no acute cardiopulmonary abnormality identified. Electronically Signed   By: Odessa Fleming M.D.   On: 08/04/2022 05:56        Scheduled Meds:  apixaban  5 mg Oral BID   dutasteride  0.5 mg Oral Daily   guaiFENesin  600 mg Oral BID   metoprolol succinate  50 mg Oral Daily   montelukast  10 mg Oral QHS   potassium chloride  10 mEq Oral Daily   sodium chloride flush  3 mL Intravenous Q12H   tamsulosin  0.8 mg Oral QHS   torsemide  40 mg Oral Daily     LOS: 1 day    Time spent: 35 minutes    Ruben Pryor Hoover Brunette, DO [facility]  If 7PM-7AM, please contact night-coverage [email or web address] 08/05/2022, 8:43 AM

## 2022-08-06 DIAGNOSIS — J9601 Acute respiratory failure with hypoxia: Secondary | ICD-10-CM | POA: Diagnosis not present

## 2022-08-06 LAB — CBC
HCT: 37 % — ABNORMAL LOW (ref 39.0–52.0)
Hemoglobin: 12.2 g/dL — ABNORMAL LOW (ref 13.0–17.0)
MCH: 33.5 pg (ref 26.0–34.0)
MCHC: 33 g/dL (ref 30.0–36.0)
MCV: 101.6 fL — ABNORMAL HIGH (ref 80.0–100.0)
Platelets: 242 10*3/uL (ref 150–400)
RBC: 3.64 MIL/uL — ABNORMAL LOW (ref 4.22–5.81)
RDW: 15.8 % — ABNORMAL HIGH (ref 11.5–15.5)
WBC: 9.3 10*3/uL (ref 4.0–10.5)
nRBC: 0 % (ref 0.0–0.2)

## 2022-08-06 LAB — BASIC METABOLIC PANEL
Anion gap: 9 (ref 5–15)
BUN: 30 mg/dL — ABNORMAL HIGH (ref 8–23)
CO2: 30 mmol/L (ref 22–32)
Calcium: 8.5 mg/dL — ABNORMAL LOW (ref 8.9–10.3)
Chloride: 95 mmol/L — ABNORMAL LOW (ref 98–111)
Creatinine, Ser: 1.05 mg/dL (ref 0.61–1.24)
GFR, Estimated: >60 mL/min (ref >60)
Glucose, Bld: 105 mg/dL — ABNORMAL HIGH (ref 70–99)
Potassium: 4.1 mmol/L (ref 3.5–5.1)
Sodium: 134 mmol/L — ABNORMAL LOW (ref 135–145)

## 2022-08-06 LAB — MAGNESIUM: Magnesium: 2 mg/dL (ref 1.7–2.4)

## 2022-08-06 MED ORDER — SALINE SPRAY 0.65 % NA SOLN: 1.0000 | NASAL | 0 refills | Status: AC | PRN

## 2022-08-06 MED ORDER — SALINE SPRAY 0.65 % NA SOLN: 1.0000 | NASAL | Status: DC | PRN

## 2022-08-06 MED ORDER — ALBUTEROL SULFATE HFA 108 (90 BASE) MCG/ACT IN AERS: 2.0000 | INHALATION_SPRAY | Freq: Four times a day (QID) | RESPIRATORY_TRACT | 2 refills | Status: AC | PRN

## 2022-08-06 MED ORDER — BENZONATATE 200 MG PO CAPS: 200.0000 mg | ORAL_CAPSULE | Freq: Two times a day (BID) | ORAL | 0 refills | Status: AC | PRN

## 2022-08-06 NOTE — Care Management (Signed)
  Transition of Care [facility]) Screening Note   Patient Details  Name: Ruben [name] Date of Birth: 1926-12-13   Transition of Care [facility]) CM/SW Contact:    Bethena Roys, RN Phone Number: 08/06/2022, 1:45 PM    Transition of Care Department [facility]) has reviewed the patient. Patient qualifies for home oxygen. Patient wanted Case Manager to call the son and the son unable to remember the last DME company. Son is agreeable to oxygen via [employer]. Orders submitted to [employer] and oxygen to be delivered to the room prior to transition home. No further needs identified at this time.

## 2022-08-06 NOTE — Discharge Summary (Signed)
Physician Discharge Summary  Ruben [name] YNW:[id number] DOB: 05-30-1927 DOA: 08/04/2022  PCP: Clovis Riley, L.August Saucer, MD  Admit date: 08/04/2022 Discharge date: 08/06/2022  Admitted From: home Discharge disposition: home   Recommendations for Outpatient Follow-Up:   Home O2 for short term-- suspect can be weaned off   Discharge Diagnosis:   Principal Problem:   Acute respiratory failure with hypoxia (HCC) Active Problems:   Rhinovirus infection   COPD (chronic obstructive pulmonary disease) (HCC)   Atrial fibrillation, chronic (HCC)   Heart failure with preserved ejection fraction (HCC)   Essential hypertension   S/P TAVR (transcatheter aortic valve replacement)   Macrocytic anemia   BPH (benign prostatic hyperplasia)    Discharge Condition: Improved.  Diet recommendation: Low sodium, heart healthy  Wound care: None.  Code status: Full.   History of Present Illness:   Ruben [name] is a 89 y.o. male with medical history significant of diastolic CHF, AS s/p TAVR, persistent atrial fibrillation, COPD who presents with complaints of progressively worsening shortness of breath over the last 2 days.  At baseline patient is not on oxygen and cares for his wife who previously had a stroke with residual right-sided weakness at [employer].  He complains of having congestion and cough, but was having a difficult time coughing up any sputum.  He had been taking Mucinex and other over-the-counter cough medicines without relief.  Patient reported being easily winded with any exertion.  Denies having any significant fever, wheezing, abdominal pain, nausea, vomiting, or diarrhea to his knowledge.  Overnight patient reported worsening shortness of breath for which he called EMS.   With EMS patient is O2 saturations were 78% on room air.  Patient was wheezing and received DuoNeb breathing treatment and placed on 4 L of nasal cannula oxygen.   In the emergency department patient was  noted to be afebrile with tachypnea, and O2 sat currently maintained on 2-3 L of oxygen greater than 92%.  Labs noted WBC 8, hemoglobin 11.8, sodium 134, BUN 23, creatinine 0.98, calcium 8.4, and BNP 385.4.  Chest x-ray noted stable cardiomegaly and regressed or resolved small pleural effusion and no other acute abnormality appreciated.  Influenza, COVID-19, and RSV screening were negative.  Patient had been given 125 mg of Solu-Medrol IV.   Hospital Course by Problem:   Acute respiratory failure with hypoxia secondary to rhinovirus  Patient presents with reports of cough and worsening shortness of breath over the last 2 days.  O2 saturations reported to be as low as 78% on room air with improvement on 4 L nasal cannula oxygen.  On physical exam patient without significant wheezes or rhonchi appreciated at this time.  Chest x-ray noted regressed or resolved small pleural effusions with no acute cardiopulmonary abnormality identified.  Complete respiratory virus panel positive for rhino/enterovirus. -Continuous pulse oximetry with nasal cannula oxygen to maintain O2 saturation greater than 92%.  Wean as tolerated to room air -Incentive spirometry and flutter valve -Ambulation in room -Procalcitonin low, no need for antibiotics -Mucinex   COPD, without acute exacerbation Patient had reportedly been wheezing prior to arrival and had given DuoNeb.  In the emergency department patient had been given 125 mg of Solu-Medrol IV.  He does not appear to be actively wheezing on physical at this time. -Albuterol prn   Chronic atrial fibrillation on chronic anticoagulation Patient currently appears to be rate controlled. -Continue metoprolol and Eliquis   Mild hyperkalemia -replete   Heart failure with preserved  EF Chronic.  On physical exam patient with no significant JVD and 1+ pitting edema of the bilateral lower extremities.  Last echocardiogram in field EF to be 60 to 65%.  BNP was noted to be lower  than priors at 385.4 -Strict I&Os and daily weights -Continue beta-blocker and torsemide   Essential hypertension Blood pressures range from 107/62-140/78. -Continue current home blood pressure regimen as tolerated   Severe aortic stenosis s/p TAVR   Macrocytic anemia Hemoglobin 11.8 g/dL which lower than prior in March of 14.5 g/dL.  Patient denied any reports of bleeding. -outpatient follow up  BPH -Continue Flomax and Avodart    Medical Consultants:      Discharge Exam:   Vitals:   08/06/22 0900 08/06/22 1049  BP: (!) 96/50 101/62  Pulse: 77 99  Resp: 20   Temp: 97.6 F (36.4 C)   SpO2: 96%    Vitals:   08/06/22 0305 08/06/22 0830 08/06/22 0900 08/06/22 1049  BP: 115/63  (!) 96/50 101/62  Pulse: 66 85 77 99  Resp: 13 18 20    Temp: 97.6 F (36.4 C)  97.6 F (36.4 C)   TempSrc: Oral  Oral   SpO2: 98% 99% 96%   Weight:      Height:        General exam: Appears calm and comfortable.    The results of significant diagnostics from this hospitalization (including imaging, microbiology, ancillary and laboratory) are listed below for reference.     Procedures and Diagnostic Studies:   DG Chest 2 View  Result Date: 08/04/2022 CLINICAL DATA:  89 year old male with cough, shortness of breath, chest congestion. Former smoker. EXAM: CHEST - 2 VIEW COMPARISON:  Chest radiographs 10/31/2020 and earlier. FINDINGS: Upright AP and lateral views at 0441 hours. Interval TAVR. Stable cardiomegaly and mediastinal contours. Calcified aortic atherosclerosis. Chronically large lung volumes. Regressed or resolved small pleural effusions seen last year. No pneumothorax. Pulmonary vascularity appears stable, some chronic pulmonary interstitial changes favored to be smoking related. No consolidation. Previous left clavicle ORIF. Osteopenia. No acute osseous abnormality identified. Negative visible bowel gas. IMPRESSION: 1. Interval TAVR. Stable cardiomegaly. Aortic Atherosclerosis  (ICD10-I70.0). 2. Regressed or resolved small pleural effusions seen last no acute cardiopulmonary abnormality identified. Electronically Signed   By: Genevie Ann M.D.   On: 08/04/2022 05:56     Labs:   Basic Metabolic Panel: Recent Labs  Lab 08/04/22 0425 08/05/22 0056 08/06/22 0059  NA 134* 134* 134*  K 4.1 5.2* 4.1  CL 99 100 95*  CO2 28 25 30   GLUCOSE 103* 131* 105*  BUN 23 23 30*  CREATININE 0.98 0.96 1.05  CALCIUM 8.4* 8.7* 8.5*  MG  --   --  2.0   GFR Estimated Creatinine Clearance: 44 mL/min (by C-G formula based on SCr of 1.05 mg/dL). Liver Function Tests: No results for input(s): "AST", "ALT", "ALKPHOS", "BILITOT", "PROT", "ALBUMIN" in the last 168 hours. No results for input(s): "LIPASE", "AMYLASE" in the last 168 hours. No results for input(s): "AMMONIA" in the last 168 hours. Coagulation profile Recent Labs  Lab 08/04/22 0425  INR 1.3*    CBC: Recent Labs  Lab 08/04/22 0425 08/05/22 0056 08/06/22 0059  WBC 8.0 6.0 9.3  NEUTROABS 5.6  --   --   HGB 11.8* 12.4* 12.2*  HCT 35.6* 37.6* 37.0*  MCV 102.3* 100.8* 101.6*  PLT 240 238 242   Cardiac Enzymes: No results for input(s): "CKTOTAL", "CKMB", "CKMBINDEX", "TROPONINI" in the last 168 hours. BNP:  Invalid input(s): "POCBNP" CBG: No results for input(s): "GLUCAP" in the last 168 hours. D-Dimer No results for input(s): "DDIMER" in the last 72 hours. Hgb A1c No results for input(s): "HGBA1C" in the last 72 hours. Lipid Profile No results for input(s): "CHOL", "HDL", "LDLCALC", "TRIG", "CHOLHDL", "LDLDIRECT" in the last 72 hours. Thyroid function studies Recent Labs    08/04/22 0425  TSH 0.893   Anemia work up No results for input(s): "VITAMINB12", "FOLATE", "FERRITIN", "TIBC", "IRON", "RETICCTPCT" in the last 72 hours. Microbiology Recent Results (from the past 240 hour(s))  Resp panel by RT-PCR (RSV, Flu A&B, Covid) Anterior Nasal Swab     Status: None   Collection Time: 08/04/22  4:31 AM    Specimen: Anterior Nasal Swab  Result Value Ref Range Status   SARS Coronavirus 2 by RT PCR NEGATIVE NEGATIVE Final    Comment: (NOTE) SARS-CoV-2 target nucleic acids are NOT DETECTED.  The SARS-CoV-2 RNA is generally detectable in upper respiratory specimens during the acute phase of infection. The lowest concentration of SARS-CoV-2 viral copies this assay can detect is 138 copies/mL. A negative result does not preclude SARS-Cov-2 infection and should not be used as the sole basis for treatment or other patient management decisions. A negative result may occur with  improper specimen collection/handling, submission of specimen other than nasopharyngeal swab, presence of viral mutation(s) within the areas targeted by this assay, and inadequate number of viral copies(<138 copies/mL). A negative result must be combined with clinical observations, patient history, and epidemiological information. The expected result is Negative.  Fact Sheet for Patients:  BloggerCourse.com  Fact Sheet for Healthcare Providers:  SeriousBroker.it  This test is no t yet approved or cleared by the [place] FDA and  has been authorized for detection and/or diagnosis of SARS-CoV-2 by FDA under an Emergency Use Authorization (EUA). This EUA will remain  in effect (meaning this test can be used) for the duration of the COVID-19 declaration under Section 564(b)(1) of the Act, 21 U.S.C.section 360bbb-3(b)(1), unless the authorization is terminated  or revoked sooner.       Influenza A by PCR NEGATIVE NEGATIVE Final   Influenza B by PCR NEGATIVE NEGATIVE Final    Comment: (NOTE) The Xpert Xpress SARS-CoV-2/FLU/RSV plus assay is intended as an aid in the diagnosis of influenza from Nasopharyngeal swab specimens and should not be used as a sole basis for treatment. Nasal washings and aspirates are unacceptable for Xpert Xpress  SARS-CoV-2/FLU/RSV testing.  Fact Sheet for Patients: BloggerCourse.com  Fact Sheet for Healthcare Providers: SeriousBroker.it  This test is not yet approved or cleared by the [place] FDA and has been authorized for detection and/or diagnosis of SARS-CoV-2 by FDA under an Emergency Use Authorization (EUA). This EUA will remain in effect (meaning this test can be used) for the duration of the COVID-19 declaration under Section 564(b)(1) of the Act, 21 U.S.C. section 360bbb-3(b)(1), unless the authorization is terminated or revoked.     Resp Syncytial Virus by PCR NEGATIVE NEGATIVE Final    Comment: (NOTE) Fact Sheet for Patients: BloggerCourse.com  Fact Sheet for Healthcare Providers: SeriousBroker.it  This test is not yet approved or cleared by the [place] FDA and has been authorized for detection and/or diagnosis of SARS-CoV-2 by FDA under an Emergency Use Authorization (EUA). This EUA will remain in effect (meaning this test can be used) for the duration of the COVID-19 declaration under Section 564(b)(1) of the Act, 21 U.S.C. section 360bbb-3(b)(1), unless the authorization is terminated or  revoked.  Performed at [facility], [address] [place], [place] [address]   Respiratory (~20 pathogens) panel by PCR     Status: Abnormal   Collection Time: 08/04/22  7:37 AM   Specimen: Nasopharyngeal Swab; Respiratory  Result Value Ref Range Status   Adenovirus NOT DETECTED NOT DETECTED Final   Coronavirus 229E NOT DETECTED NOT DETECTED Final    Comment: (NOTE) The Coronavirus on the Respiratory Panel, DOES NOT test for the novel  Coronavirus (2019 nCoV)    Coronavirus HKU1 NOT DETECTED NOT DETECTED Final   Coronavirus NL63 NOT DETECTED NOT DETECTED Final   Coronavirus OC43 NOT DETECTED NOT DETECTED Final   Metapneumovirus NOT DETECTED NOT DETECTED Final    Rhinovirus / Enterovirus DETECTED (A) NOT DETECTED Final   Influenza A NOT DETECTED NOT DETECTED Final   Influenza B NOT DETECTED NOT DETECTED Final   Parainfluenza Virus 1 NOT DETECTED NOT DETECTED Final   Parainfluenza Virus 2 NOT DETECTED NOT DETECTED Final   Parainfluenza Virus 3 NOT DETECTED NOT DETECTED Final   Parainfluenza Virus 4 NOT DETECTED NOT DETECTED Final   Respiratory Syncytial Virus NOT DETECTED NOT DETECTED Final   Bordetella pertussis NOT DETECTED NOT DETECTED Final   Bordetella Parapertussis NOT DETECTED NOT DETECTED Final   Chlamydophila pneumoniae NOT DETECTED NOT DETECTED Final   Mycoplasma pneumoniae NOT DETECTED NOT DETECTED Final    Comment: Performed at [facility], [address] [place], [place] [address]     Discharge Instructions:   Discharge Instructions     Diet - low sodium heart healthy   Complete by: As directed    Increase activity slowly   Complete by: As directed       Allergies as of 08/06/2022   No Known Allergies      Medication List     STOP taking these medications    Pfizer COVID-19 Vac Bivalent injection Generic drug: COVID-19 mRNA bivalent vaccine Proofreader)   traMADol 50 MG tablet Commonly known as: ULTRAM   vitamin B-12 500 MCG tablet Commonly known as: CYANOCOBALAMIN       TAKE these medications    acetaminophen 500 MG tablet Commonly known as: TYLENOL Take 1,000 mg by mouth in the morning, at noon, and at bedtime.   albuterol 108 (90 Base) MCG/ACT inhaler Commonly known as: VENTOLIN HFA Inhale 2 puffs into the lungs every 6 (six) hours as needed for wheezing or shortness of breath.   amoxicillin 500 MG tablet Commonly known as: AMOXIL Take 4 tablets (2,000 mg) one hour prior to all dental visits.   apixaban 5 MG Tabs tablet Commonly known as: Eliquis Take 1 tablet (5 mg total) by mouth 2 (two) times daily.   benzonatate 200 MG capsule Commonly known as: TESSALON Take 1 capsule (200 mg total)  by mouth 2 (two) times daily as needed for cough.   diclofenac Sodium 1 % Gel Commonly known as: VOLTAREN Apply 1 application topically 3 (three) times daily as needed (to both knees for arthritis pain).   dutasteride 0.5 MG capsule Commonly known as: AVODART Take 0.5 mg by mouth in the morning and at bedtime.   guaiFENesin 600 MG 12 hr tablet Commonly known as: MUCINEX Take 1 tablet (600 mg total) by mouth 2 (two) times daily as needed for cough or to loosen phlegm.   Klor-Con M10 10 MEQ tablet Generic drug: potassium chloride Take 10 mEq by mouth daily.   metoprolol succinate 50 MG 24 hr  tablet Commonly known as: TOPROL-XL Take 50 mg by mouth daily.   montelukast 10 MG tablet Commonly known as: SINGULAIR Take 10 mg by mouth at bedtime.   polyethylene glycol 17 g packet Commonly known as: MIRALAX / GLYCOLAX Take 17 g by mouth daily as needed for mild constipation.   polyvinyl alcohol 1.4 % ophthalmic solution Commonly known as: LIQUIFILM TEARS Place 1 drop into both eyes as needed for dry eyes.   senna-docusate 8.6-50 MG tablet Commonly known as: Senokot-S Take 2 tablets by mouth daily.   sodium chloride 0.65 % Soln nasal spray Commonly known as: OCEAN Place 1 spray into both nostrils as needed for congestion.   tamsulosin 0.4 MG Caps capsule Commonly known as: FLOMAX Take 0.8 mg by mouth at bedtime.   torsemide 20 MG tablet Commonly known as: DEMADEX Take 40 mg by mouth daily.          Time coordinating discharge: 45 min  Signed:  Geradine Girt DO  [facility] 08/06/2022, 1:13 PM

## 2022-08-06 NOTE — Social Work (Incomplete)
CSW received notice patient needs clothing items before discharging home & transportation. CSW provided RN with [employer] contact information and clothing items requested.

## 2022-08-06 NOTE — Progress Notes (Addendum)
SATURATION QUALIFICATIONS: (This note is used to comply with regulatory documentation for home oxygen)  Patient Saturations on Room Air at Rest = 90%  Patient Saturations on Room Air while Ambulating = 86%  Patient Saturations on 2 Liters of oxygen while Ambulating = 95%  Please briefly explain why patient needs home oxygen: Pt saturating begin to decline with ambulation. No complaints of SOB.

## 2022-09-17 ENCOUNTER — Encounter (HOSPITAL_COMMUNITY): Payer: Self-pay

## 2022-09-17 ENCOUNTER — Other Ambulatory Visit: Payer: Self-pay

## 2022-09-17 ENCOUNTER — Observation Stay (HOSPITAL_COMMUNITY)
Admission: EM | Admit: 2022-09-17 | Discharge: 2022-09-18 | Disposition: A | Discharge status: Skilled Nursing Facility | Payer: Medicare Other | Attending: Family Medicine | Admitting: Family Medicine

## 2022-09-17 ENCOUNTER — Emergency Department (HOSPITAL_COMMUNITY): Payer: Medicare Other

## 2022-09-17 DIAGNOSIS — I110 Hypertensive heart disease with heart failure: Secondary | ICD-10-CM | POA: Insufficient documentation

## 2022-09-17 DIAGNOSIS — R0902 Hypoxemia: Secondary | ICD-10-CM

## 2022-09-17 DIAGNOSIS — Z96653 Presence of artificial knee joint, bilateral: Secondary | ICD-10-CM | POA: Diagnosis not present

## 2022-09-17 DIAGNOSIS — Z87891 Personal history of nicotine dependence: Secondary | ICD-10-CM | POA: Diagnosis not present

## 2022-09-17 DIAGNOSIS — Z7901 Long term (current) use of anticoagulants: Secondary | ICD-10-CM | POA: Diagnosis not present

## 2022-09-17 DIAGNOSIS — I482 Chronic atrial fibrillation, unspecified: Secondary | ICD-10-CM | POA: Diagnosis present

## 2022-09-17 DIAGNOSIS — I5030 Unspecified diastolic (congestive) heart failure: Secondary | ICD-10-CM | POA: Diagnosis present

## 2022-09-17 DIAGNOSIS — Z79899 Other long term (current) drug therapy: Secondary | ICD-10-CM | POA: Diagnosis not present

## 2022-09-17 DIAGNOSIS — R0602 Shortness of breath: Secondary | ICD-10-CM | POA: Diagnosis present

## 2022-09-17 DIAGNOSIS — D509 Iron deficiency anemia, unspecified: Secondary | ICD-10-CM | POA: Diagnosis not present

## 2022-09-17 DIAGNOSIS — J449 Chronic obstructive pulmonary disease, unspecified: Secondary | ICD-10-CM | POA: Diagnosis present

## 2022-09-17 DIAGNOSIS — I5033 Acute on chronic diastolic (congestive) heart failure: Secondary | ICD-10-CM | POA: Diagnosis not present

## 2022-09-17 DIAGNOSIS — J9601 Acute respiratory failure with hypoxia: Secondary | ICD-10-CM | POA: Diagnosis present

## 2022-09-17 DIAGNOSIS — I509 Heart failure, unspecified: Secondary | ICD-10-CM

## 2022-09-17 DIAGNOSIS — Z952 Presence of prosthetic heart valve: Secondary | ICD-10-CM | POA: Diagnosis not present

## 2022-09-17 DIAGNOSIS — Z1152 Encounter for screening for COVID-19: Secondary | ICD-10-CM | POA: Insufficient documentation

## 2022-09-17 LAB — CBC WITH DIFFERENTIAL/PLATELET
Abs Immature Granulocytes: 0.03 10*3/uL (ref 0.00–0.07)
Basophils Absolute: 0.1 10*3/uL (ref 0.0–0.1)
Basophils Relative: 1 %
Eosinophils Absolute: 0.1 10*3/uL (ref 0.0–0.5)
Eosinophils Relative: 1 %
HCT: 40.9 % (ref 39.0–52.0)
Hemoglobin: 13 g/dL (ref 13.0–17.0)
Immature Granulocytes: 0 %
Lymphocytes Relative: 12 %
Lymphs Abs: 0.8 10*3/uL (ref 0.7–4.0)
MCH: 32.7 pg (ref 26.0–34.0)
MCHC: 31.8 g/dL (ref 30.0–36.0)
MCV: 103 fL — ABNORMAL HIGH (ref 80.0–100.0)
Monocytes Absolute: 0.5 10*3/uL (ref 0.1–1.0)
Monocytes Relative: 8 %
Neutro Abs: 5.4 10*3/uL (ref 1.7–7.7)
Neutrophils Relative %: 78 %
Platelets: 192 10*3/uL (ref 150–400)
RBC: 3.97 MIL/uL — ABNORMAL LOW (ref 4.22–5.81)
RDW: 15.3 % (ref 11.5–15.5)
WBC: 6.9 10*3/uL (ref 4.0–10.5)
nRBC: 0 % (ref 0.0–0.2)

## 2022-09-17 LAB — COMPREHENSIVE METABOLIC PANEL
ALT: 12 U/L (ref 0–44)
AST: 20 U/L (ref 15–41)
Albumin: 3.7 g/dL (ref 3.5–5.0)
Alkaline Phosphatase: 85 U/L (ref 38–126)
Anion gap: 9 (ref 5–15)
BUN: 17 mg/dL (ref 8–23)
CO2: 32 mmol/L (ref 22–32)
Calcium: 8.9 mg/dL (ref 8.9–10.3)
Chloride: 94 mmol/L — ABNORMAL LOW (ref 98–111)
Creatinine, Ser: 1 mg/dL (ref 0.61–1.24)
GFR, Estimated: >60 mL/min (ref >60)
Glucose, Bld: 92 mg/dL (ref 70–99)
Potassium: 4 mmol/L (ref 3.5–5.1)
Sodium: 135 mmol/L (ref 135–145)
Total Bilirubin: 1.2 mg/dL (ref 0.3–1.2)
Total Protein: 7.1 g/dL (ref 6.5–8.1)

## 2022-09-17 LAB — BRAIN NATRIURETIC PEPTIDE: B Natriuretic Peptide: 398.3 pg/mL — ABNORMAL HIGH (ref 0.0–100.0)

## 2022-09-17 LAB — RESP PANEL BY RT-PCR (RSV, FLU A&B, COVID)  RVPGX2
Influenza A by PCR: NEGATIVE
Influenza B by PCR: NEGATIVE
Resp Syncytial Virus by PCR: NEGATIVE
SARS Coronavirus 2 by RT PCR: NEGATIVE

## 2022-09-17 LAB — TROPONIN I (HIGH SENSITIVITY)
Troponin I (High Sensitivity): 14 ng/L (ref <18)
Troponin I (High Sensitivity): 15 ng/L (ref <18)

## 2022-09-17 MED ORDER — ACETAMINOPHEN 325 MG PO TABS
650.0000 mg | ORAL_TABLET | ORAL | Status: DC | PRN
  Administered 2022-09-17: 650 mg via ORAL
  Filled 2022-09-17: qty 2

## 2022-09-17 MED ORDER — FUROSEMIDE 10 MG/ML IJ SOLN
40.0000 mg | Freq: Two times a day (BID) | INTRAMUSCULAR | Status: DC
  Administered 2022-09-17 – 2022-09-18 (×2): 40 mg via INTRAVENOUS
  Filled 2022-09-17 (×2): qty 4

## 2022-09-17 MED ORDER — GUAIFENESIN ER 600 MG PO TB12
600.0000 mg | ORAL_TABLET | Freq: Two times a day (BID) | ORAL | Status: DC | PRN
  Administered 2022-09-17: 600 mg via ORAL
  Filled 2022-09-17: qty 1

## 2022-09-17 MED ORDER — IOHEXOL 350 MG/ML SOLN
75.0000 mL | Freq: Once | INTRAVENOUS | Status: AC | PRN
  Administered 2022-09-17: 75 mL via INTRAVENOUS

## 2022-09-17 MED ORDER — SALINE SPRAY 0.65 % NA SOLN: 1.0000 | NASAL | Status: DC | PRN

## 2022-09-17 MED ORDER — SODIUM CHLORIDE 0.9 % IV SOLN: 250.0000 mL | INTRAVENOUS | Status: DC | PRN

## 2022-09-17 MED ORDER — POTASSIUM CHLORIDE CRYS ER 10 MEQ PO TBCR
10.0000 meq | EXTENDED_RELEASE_TABLET | Freq: Every day | ORAL | Status: DC
  Administered 2022-09-18: 10 meq via ORAL
  Filled 2022-09-17: qty 1

## 2022-09-17 MED ORDER — DICLOFENAC SODIUM 1 % EX GEL
2.0000 g | Freq: Four times a day (QID) | CUTANEOUS | Status: DC
  Administered 2022-09-17 – 2022-09-18 (×2): 2 g via TOPICAL
  Filled 2022-09-17: qty 100

## 2022-09-17 MED ORDER — APIXABAN 5 MG PO TABS
5.0000 mg | ORAL_TABLET | Freq: Two times a day (BID) | ORAL | Status: DC
  Administered 2022-09-17 – 2022-09-18 (×2): 5 mg via ORAL
  Filled 2022-09-17 (×2): qty 1

## 2022-09-17 MED ORDER — POLYETHYLENE GLYCOL 3350 17 G PO PACK: 17.0000 g | PACK | Freq: Every day | ORAL | Status: DC | PRN

## 2022-09-17 MED ORDER — ALBUTEROL SULFATE (2.5 MG/3ML) 0.083% IN NEBU: 2.5000 mg | INHALATION_SOLUTION | Freq: Four times a day (QID) | RESPIRATORY_TRACT | Status: DC | PRN

## 2022-09-17 MED ORDER — POLYVINYL ALCOHOL 1.4 % OP SOLN: 1.0000 [drp] | OPHTHALMIC | Status: DC | PRN

## 2022-09-17 MED ORDER — MONTELUKAST SODIUM 10 MG PO TABS
10.0000 mg | ORAL_TABLET | Freq: Every day | ORAL | Status: DC
  Administered 2022-09-17: 10 mg via ORAL
  Filled 2022-09-17: qty 1

## 2022-09-17 MED ORDER — SODIUM CHLORIDE 0.9% FLUSH: 3.0000 mL | INTRAVENOUS | Status: DC | PRN

## 2022-09-17 MED ORDER — DIGOXIN 0.25 MG/ML IJ SOLN
0.1250 mg | Freq: Once | INTRAMUSCULAR | Status: AC
  Administered 2022-09-17: 0.125 mg via INTRAVENOUS
  Filled 2022-09-17: qty 2

## 2022-09-17 MED ORDER — SENNOSIDES-DOCUSATE SODIUM 8.6-50 MG PO TABS
2.0000 | ORAL_TABLET | Freq: Every day | ORAL | Status: DC
  Administered 2022-09-18: 2 via ORAL
  Filled 2022-09-17: qty 2

## 2022-09-17 MED ORDER — SODIUM CHLORIDE 0.9% FLUSH
3.0000 mL | Freq: Two times a day (BID) | INTRAVENOUS | Status: DC
  Administered 2022-09-17 – 2022-09-18 (×2): 3 mL via INTRAVENOUS

## 2022-09-17 MED ORDER — ACETAMINOPHEN 500 MG PO TABS
500.0000 mg | ORAL_TABLET | Freq: Every day | ORAL | Status: DC
  Administered 2022-09-18: 500 mg via ORAL
  Filled 2022-09-17: qty 1

## 2022-09-17 MED ORDER — DUTASTERIDE 0.5 MG PO CAPS
0.5000 mg | ORAL_CAPSULE | Freq: Every day | ORAL | Status: DC
  Administered 2022-09-18: 0.5 mg via ORAL
  Filled 2022-09-17: qty 1

## 2022-09-17 MED ORDER — PNEUMOCOCCAL 20-VAL CONJ VACC 0.5 ML IM SUSY
0.5000 mL | PREFILLED_SYRINGE | INTRAMUSCULAR | Status: DC
  Filled 2022-09-17: qty 0.5

## 2022-09-17 MED ORDER — FUROSEMIDE 10 MG/ML IJ SOLN
40.0000 mg | Freq: Once | INTRAMUSCULAR | Status: AC
  Administered 2022-09-17: 40 mg via INTRAVENOUS
  Filled 2022-09-17: qty 4

## 2022-09-17 MED ORDER — ONDANSETRON HCL 4 MG/2ML IJ SOLN: 4.0000 mg | Freq: Four times a day (QID) | INTRAMUSCULAR | Status: DC | PRN

## 2022-09-17 MED ORDER — BENZONATATE 100 MG PO CAPS: 200.0000 mg | ORAL_CAPSULE | Freq: Two times a day (BID) | ORAL | Status: DC | PRN

## 2022-09-17 MED ORDER — TAMSULOSIN HCL 0.4 MG PO CAPS
0.8000 mg | ORAL_CAPSULE | Freq: Every day | ORAL | Status: DC
  Administered 2022-09-17: 0.8 mg via ORAL
  Filled 2022-09-17: qty 2

## 2022-09-17 MED ORDER — ALBUTEROL SULFATE HFA 108 (90 BASE) MCG/ACT IN AERS: 2.0000 | INHALATION_SPRAY | Freq: Four times a day (QID) | RESPIRATORY_TRACT | Status: DC | PRN

## 2022-09-17 MED ORDER — METOPROLOL SUCCINATE ER 50 MG PO TB24
50.0000 mg | ORAL_TABLET | Freq: Every day | ORAL | Status: DC
  Administered 2022-09-18: 50 mg via ORAL
  Filled 2022-09-17: qty 1

## 2022-09-17 NOTE — ED Notes (Signed)
ED TO INPATIENT HANDOFF REPORT  ED Nurse Name and Phone #: [phone]  S Name/Age/Gender Ruben [name] 89 y.o. male Room/Bed: 040C/040C  Code Status   Code Status: DNR  Home/SNF/Other Skilled nursing facility Patient oriented to: self, place, time, and situation Is this baseline? Yes   Triage Complete: Triage complete  Chief Complaint CHF (congestive heart failure) ([place]) [I50.9]  Triage Note Pt bib ems from [facility]; c/o worsening sob since last Thursday, recent admit for copd exacerbation around new years; lung sounds clear and equal with ems; 89% 2L; placed on, 4L 96%, ; increased swelling LE , hx CHF; pitting edema LLE;  denies dizziness, no CP; 12 lead afib w/ R BBB; 146/82, HR 76 irreg, RR 22   Allergies No Known Allergies  Level of Care/Admitting Diagnosis ED Disposition     ED Disposition  Admit   Condition  --   [place]: [place] [[id number]]  Level of Care: Telemetry Medical [[id number]]  May place patient in observation at [facility] or [facility] if equivalent level of care is available:: No  Covid Evaluation: Asymptomatic - no recent exposure (last 10 days) testing not required  Diagnosis: CHF (congestive heart failure) [facility]) [id number]  Admitting Physician: Lequita Halt [id number]  Attending Physician: Lequita Halt [id number]          B Medical/Surgery History Past Medical History:  Diagnosis Date   Arthritis    CHF (congestive heart failure) (HCC)    COPD (chronic obstructive pulmonary disease) ([place])    Mitral regurgitation    Persistent atrial fibrillation (HCC)    on coumadin    RBBB    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    Severe aortic stenosis    Past Surgical History:  Procedure Laterality Date   APPENDECTOMY     BUBBLE STUDY  09/07/2020   Procedure: BUBBLE STUDY;  Surgeon:  Geralynn Rile, MD;  Location: [place];  Service: Cardiovascular;;   CARDIAC CATHETERIZATION     CLAVICLE SURGERY     INTRAMEDULLARY (IM) NAIL INTERTROCHANTERIC Left 09/20/2020   Procedure: INTRAMEDULLARY (IM) NAIL INTERTROCHANTRIC;  Surgeon: Renette Butters, MD;  Location: [place];  Service: Orthopedics;  Laterality: Left;   RIGHT/LEFT HEART CATH AND CORONARY ANGIOGRAPHY N/A 08/24/2020   Procedure: RIGHT/LEFT HEART CATH AND CORONARY ANGIOGRAPHY;  Surgeon: Sherren Mocha, MD;  Location: [place] [facility];  Service: Cardiovascular;  Laterality: N/A;   TEE WITHOUT CARDIOVERSION N/A 09/07/2020   Procedure: TRANSESOPHAGEAL ECHOCARDIOGRAM (TEE);  Surgeon: Geralynn Rile, MD;  Location: [place];  Service: Cardiovascular;  Laterality: N/A;   TOTAL KNEE ARTHROPLASTY Bilateral    TRANSCATHETER AORTIC VALVE REPLACEMENT, TRANSFEMORAL N/A 11/01/2020   Procedure: TRANSCATHETER AORTIC VALVE REPLACEMENT, TRANSFEMORAL;  Surgeon: Sherren Mocha, MD;  Location: [place] [facility];  Service: Open Heart Surgery;  Laterality: N/A;     A IV Location/Drains/Wounds Patient Lines/Drains/Airways Status     Active Line/Drains/Airways     Name Placement date Placement time Site Days   Peripheral IV 09/17/22 20 G Anterior;Proximal;Right Forearm 09/17/22  1302  Forearm  less than 1   Pressure Injury Heel Left Red nonblanchable --  --  -- --            Intake/Output Last 24 hours No intake or output data in the 24 hours ending 09/17/22 1724  Labs/Imaging Results for orders placed or performed during the hospital encounter of 09/17/22 (from the past 48 hour(s))  Resp panel by RT-PCR (RSV, Flu A&B, Covid) Anterior Nasal Swab     Status: None   Collection Time: 09/17/22 12:23 PM   Specimen: Anterior Nasal Swab  Result Value Ref Range   SARS Coronavirus 2 by RT PCR NEGATIVE NEGATIVE   Influenza A by PCR NEGATIVE NEGATIVE   Influenza B by PCR NEGATIVE NEGATIVE    Comment: (NOTE) The  Xpert Xpress SARS-CoV-2/FLU/RSV plus assay is intended as an aid in the diagnosis of influenza from Nasopharyngeal swab specimens and should not be used as a sole basis for treatment. Nasal washings and aspirates are unacceptable for Xpert Xpress SARS-CoV-2/FLU/RSV testing.  Fact Sheet for Patients: EntrepreneurPulse.com.au  Fact Sheet for Healthcare Providers: IncredibleEmployment.be  This test is not yet approved or cleared by the [place] FDA and has been authorized for detection and/or diagnosis of SARS-CoV-2 by FDA under an Emergency Use Authorization (EUA). This EUA will remain in effect (meaning this test can be used) for the duration of the COVID-19 declaration under Section 564(b)(1) of the Act, 21 U.S.C. section 360bbb-3(b)(1), unless the authorization is terminated or revoked.     Resp Syncytial Virus by PCR NEGATIVE NEGATIVE    Comment: (NOTE) Fact Sheet for Patients: EntrepreneurPulse.com.au  Fact Sheet for Healthcare Providers: IncredibleEmployment.be  This test is not yet approved or cleared by the [place] FDA and has been authorized for detection and/or diagnosis of SARS-CoV-2 by FDA under an Emergency Use Authorization (EUA). This EUA will remain in effect (meaning this test can be used) for the duration of the COVID-19 declaration under Section 564(b)(1) of the Act, 21 U.S.C. section 360bbb-3(b)(1), unless the authorization is terminated or revoked.  Performed at [facility], [place] [address], [place], [place] [address]   Comprehensive metabolic panel     Status: Abnormal   Collection Time: 09/17/22 12:25 PM  Result Value Ref Range   Sodium 135 135 - 145 mmol/L   Potassium 4.0 3.5 - 5.1 mmol/L   Chloride 94 (L) 98 - 111 mmol/L   CO2 32 22 - 32 mmol/L   Glucose, Bld 92 70 - 99 mg/dL    Comment: Glucose reference range applies only to samples taken after fasting for at  least 8 hours.   BUN 17 8 - 23 mg/dL   Creatinine, Ser 1.00 0.61 - 1.24 mg/dL   Calcium 8.9 8.9 - 10.3 mg/dL   Total Protein 7.1 6.5 - 8.1 g/dL   Albumin 3.7 3.5 - 5.0 g/dL   AST 20 15 - 41 U/L   ALT 12 0 - 44 U/L   Alkaline Phosphatase 85 38 - 126 U/L   Total Bilirubin 1.2 0.3 - 1.2 mg/dL   GFR, Estimated >60 >60 mL/min    Comment: (NOTE) Calculated using the CKD-EPI Creatinine Equation (2021)    Anion gap 9 5 - 15    Comment: Performed at [facility] [address] [place], [place] [address]  Troponin I (High Sensitivity)     Status: None   Collection Time: 09/17/22 12:25 PM  Result Value Ref Range   Troponin I (High Sensitivity) 14 <18 ng/L    Comment: (NOTE) Elevated high sensitivity troponin I (hsTnI) values and significant  changes across serial measurements may suggest ACS but many other  chronic and acute conditions are known to elevate hsTnI results.  Refer to the "Links" section for  chest pain algorithms and additional  guidance. Performed at [facility], [place] [address] [place], [place] [address]   Brain natriuretic peptide     Status: Abnormal   Collection Time: 09/17/22 12:25 PM  Result Value Ref Range   B Natriuretic Peptide 398.3 (H) 0.0 - 100.0 pg/mL    Comment: Performed at [facility] [address] [place], [place] [address]  CBC with Differential     Status: Abnormal   Collection Time: 09/17/22 12:25 PM  Result Value Ref Range   WBC 6.9 4.0 - 10.5 K/uL   RBC 3.97 (L) 4.22 - 5.81 MIL/uL   Hemoglobin 13.0 13.0 - 17.0 g/dL   HCT 40.9 39.0 - 52.0 %   MCV 103.0 (H) 80.0 - 100.0 fL   MCH 32.7 26.0 - 34.0 pg   MCHC 31.8 30.0 - 36.0 g/dL   RDW 15.3 11.5 - 15.5 %   Platelets 192 150 - 400 K/uL   nRBC 0.0 0.0 - 0.2 %   Neutrophils Relative % 78 %   Neutro Abs 5.4 1.7 - 7.7 K/uL   Lymphocytes Relative 12 %   Lymphs Abs 0.8 0.7 - 4.0 K/uL   Monocytes Relative 8 %   Monocytes Absolute 0.5 0.1 - 1.0 K/uL   Eosinophils Relative 1 %    Eosinophils Absolute 0.1 0.0 - 0.5 K/uL   Basophils Relative 1 %   Basophils Absolute 0.1 0.0 - 0.1 K/uL   Immature Granulocytes 0 %   Abs Immature Granulocytes 0.03 0.00 - 0.07 K/uL    Comment: Performed at [facility] [address] [place], [place] [address]  Troponin I (High Sensitivity)     Status: None   Collection Time: 09/17/22  3:12 PM  Result Value Ref Range   Troponin I (High Sensitivity) 15 <18 ng/L    Comment: (NOTE) Elevated high sensitivity troponin I (hsTnI) values and significant  changes across serial measurements may suggest ACS but many other  chronic and acute conditions are known to elevate hsTnI results.  Refer to the "Links" section for chest pain algorithms and additional  guidance. Performed at [facility], [place] [address], [place], [place] [address]    CT Angio Chest PE W and/or Wo Contrast  Result Date: 09/17/2022 CLINICAL DATA:  Shortness of breath. EXAM: CT ANGIOGRAPHY CHEST WITH CONTRAST TECHNIQUE: Multidetector CT imaging of the chest was performed using the standard protocol during bolus administration of intravenous contrast. Multiplanar CT image reconstructions and MIPs were obtained to evaluate the vascular anatomy. RADIATION DOSE REDUCTION: This exam was performed according to the departmental dose-optimization program which includes automated exposure control, adjustment of the mA and/or kV according to patient size and/or use of iterative reconstruction technique. CONTRAST:  1m OMNIPAQUE IOHEXOL 350 MG/ML SOLN COMPARISON:  August 30, 2020. FINDINGS: Cardiovascular: Satisfactory opacification of the pulmonary arteries to the segmental level. No evidence of pulmonary embolism. Mild cardiomegaly is noted. Status post transcatheter aortic valve repair. 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 No pericardial effusion. Mediastinum/Nodes: No enlarged mediastinal, hilar, or axillary lymph nodes. Thyroid  gland, trachea, and esophagus demonstrate no significant findings. Lungs/Pleura: No pneumothorax is noted. Small right pleural effusion is noted. Minimal bibasilar subsegmental atelectasis is noted. Upper Abdomen: No acute abnormality. Musculoskeletal: No chest wall abnormality. No acute or significant osseous findings. Review of the MIP images confirms the above findings. IMPRESSION: No definite evidence of pulmonary embolus. Grossly stable probable pulmonary  arteriovenous malformation seen in right lower lobe. Small right pleural effusion. Minimal bibasilar subsegmental atelectasis. Aortic Atherosclerosis (ICD10-I70.0). Electronically Signed   By: Marijo Conception M.D.   On: 09/17/2022 14:53   DG Chest Portable 1 View  Result Date: 09/17/2022 CLINICAL DATA:  Dyspnea. EXAM: PORTABLE CHEST 1 VIEW COMPARISON:  August 04, 2022. FINDINGS: Stable cardiomegaly. Minimal right basilar opacity is noted concerning for atelectasis or infiltrate. Left lung is unremarkable. The visualized skeletal structures are unremarkable. IMPRESSION: Minimal right basilar subsegmental atelectasis or infiltrate is noted. Followup PA and lateral chest X-ray is recommended in 3-4 weeks following trial of antibiotic therapy to ensure resolution and exclude underlying malignancy. Aortic Atherosclerosis (ICD10-I70.0). Electronically Signed   By: Marijo Conception M.D.   On: 09/17/2022 12:25    Pending Labs Unresulted Labs (From admission, onward)     Start     Ordered   09/18/22 [id number]  Basic metabolic panel  Daily,   R     Comments: As Scheduled for 5 days    09/17/22 1648            Vitals/Pain Today's Vitals   09/17/22 1155 09/17/22 1230 09/17/22 1500 09/17/22 1512  BP:  114/65 115/76   Pulse:  80 71   Resp:  (!) 23 (!) 22   Temp:    98 F (36.7 C)  TempSrc:    Oral  SpO2:  99% 94%   Weight:      Height:      PainSc: 0-No pain       Isolation Precautions Airborne and Contact  precautions  Medications Medications  acetaminophen (TYLENOL) tablet 500 mg (has no administration in time range)  metoprolol succinate (TOPROL-XL) 24 hr tablet 50 mg (has no administration in time range)  polyethylene glycol (MIRALAX / GLYCOLAX) packet 17 g (has no administration in time range)  senna-docusate (Senokot-S) tablet 2 tablet (has no administration in time range)  dutasteride (AVODART) capsule 0.5 mg (has no administration in time range)  tamsulosin (FLOMAX) capsule 0.8 mg (has no administration in time range)  apixaban (ELIQUIS) tablet 5 mg (has no administration in time range)  potassium chloride (KLOR-CON M) CR tablet 10 mEq (has no administration in time range)  benzonatate (TESSALON) capsule 200 mg (has no administration in time range)  guaiFENesin (MUCINEX) 12 hr tablet 600 mg (has no administration in time range)  montelukast (SINGULAIR) tablet 10 mg (has no administration in time range)  sodium chloride (OCEAN) 0.65 % nasal spray 1 spray (has no administration in time range)  diclofenac Sodium (VOLTAREN) 1 % topical gel 2 g (has no administration in time range)  polyvinyl alcohol (LIQUIFILM TEARS) 1.4 % ophthalmic solution 1 drop (has no administration in time range)  sodium chloride flush (NS) 0.9 % injection 3 mL (has no administration in time range)  sodium chloride flush (NS) 0.9 % injection 3 mL (has no administration in time range)  0.9 %  sodium chloride infusion (has no administration in time range)  acetaminophen (TYLENOL) tablet 650 mg (has no administration in time range)  ondansetron (ZOFRAN) injection 4 mg (has no administration in time range)  furosemide (LASIX) injection 40 mg (has no administration in time range)  digoxin (LANOXIN) 0.25 MG/ML injection 0.125 mg (has no administration in time range)  albuterol (PROVENTIL) (2.5 MG/3ML) 0.083% nebulizer solution 2.5 mg (has no administration in time range)  iohexol (OMNIPAQUE) 350 MG/ML injection 75 mL (75  mLs Intravenous Contrast Given 09/17/22 1441)  furosemide (LASIX)  injection 40 mg (40 mg Intravenous Given 09/17/22 1511)    Mobility walks with device     Focused Assessments Pulmonary Assessment Handoff:  Lung sounds: Bilateral Breath Sounds: Clear L Breath Sounds: Clear O2 Device: Nasal Cannula O2 Flow Rate (L/min): 4 L/min    R Recommendations: See Admitting Provider Note  Report given to:   Additional Notes: uses a walker and 1 assist. On chronic oxygen

## 2022-09-17 NOTE — H&P (Signed)
History and Physical    LEVENTE GENTSCH [id number] DOB: 1926/08/29 DOA: 09/17/2022  PCP: Reymundo Poll, MD (Confirm with patient/family/NH records and if not entered, this has to be entered at [facility] point of entry) Patient [facility] from: [facility]  I have personally briefly reviewed patient's old medical records in [place]  Chief Complaint: Cough, shortness of breath  HPI: Ruben [name] is a 89 y.o. male with medical history significant of chronic HFpEF, aortic stenosis status post TAVR, persistent A-fib, COPD, question of at least moderate pulmonary hypertension and cor pulmonale, presented with dry cough and increasing shortness of breath.  Started 4 days ago, with dry cough and increasing exertional dyspnea, also noticed increasing of bilateral lower extremity swelling, did not take extra torsemide.  Over the weekend symptoms got worse, last night could not sleep on a flat bed, denies any chest pain no palpitations no fever or chills.  ED Course: Vital signs stable, rate controlled A-fib, blood pressure within normal limits, O2 saturation 4 L 94%, x-ray showed mild bilateral small pleural effusion and CT chest showed pulmonary congestion and bilateral pleural effusion.  Review of Systems: As per HPI otherwise 14 point review of systems negative.    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    Severe aortic stenosis     Past Surgical History:  Procedure Laterality Date   APPENDECTOMY     BUBBLE STUDY  09/07/2020   Procedure: BUBBLE STUDY;  Surgeon: Geralynn Rile, MD;  Location: [facility];  Service: Cardiovascular;;   CARDIAC  CATHETERIZATION     CLAVICLE SURGERY     INTRAMEDULLARY (IM) NAIL INTERTROCHANTERIC Left 09/20/2020   Procedure: INTRAMEDULLARY (IM) NAIL INTERTROCHANTRIC;  Surgeon: Renette Butters, MD;  Location: [facility];  Service: Orthopedics;  Laterality: Left;   RIGHT/LEFT HEART CATH AND CORONARY ANGIOGRAPHY N/A 08/24/2020   Procedure: RIGHT/LEFT HEART CATH AND CORONARY ANGIOGRAPHY;  Surgeon: Sherren Mocha, MD;  Location: [facility];  Service: Cardiovascular;  Laterality: N/A;   TEE WITHOUT CARDIOVERSION N/A 09/07/2020   Procedure: TRANSESOPHAGEAL ECHOCARDIOGRAM (TEE);  Surgeon: Geralynn Rile, MD;  Location: [facility];  Service: Cardiovascular;  Laterality: N/A;   TOTAL KNEE ARTHROPLASTY Bilateral    TRANSCATHETER AORTIC VALVE REPLACEMENT, TRANSFEMORAL N/A 11/01/2020   Procedure: TRANSCATHETER AORTIC VALVE REPLACEMENT, TRANSFEMORAL;  Surgeon: Sherren Mocha, MD;  Location: [facility];  Service: Open Heart Surgery;  Laterality: N/A;     reports that he quit smoking about 49 years ago. His smoking use included cigarettes. He has a 30.00 pack-year smoking history. He has never used smokeless tobacco. He reports that he does not currently use alcohol. He reports that he does not use drugs.  No Known Allergies  History reviewed. No pertinent family history.   Prior to Admission medications   Medication Sig Start Date End Date Taking? Authorizing Provider  acetaminophen (TYLENOL) 500 MG tablet Take 500 mg by mouth daily.   Yes [provider]  albuterol (VENTOLIN HFA) 108 (90 Base) MCG/ACT inhaler Inhale 2 puffs into the lungs every 6 (six) hours as needed  for wheezing or shortness of breath. 08/06/22  Yes Geradine Girt, DO  apixaban (ELIQUIS) 5 MG TABS tablet Take 1 tablet (5 mg total) by mouth 2 (two) times daily. 10/23/21  Yes O'Neal, Cassie Freer, MD  benzonatate (TESSALON) 200 MG capsule Take 1 capsule (200 mg total) by mouth 2 (two) times daily as needed for cough.  08/06/22  Yes Eulogio Bear U, DO  diclofenac Sodium (VOLTAREN) 1 % GEL Apply 1 application topically 3 (three) times daily as needed (to both knees for arthritis pain).   Yes [provider]  diphenhydramine-acetaminophen (TYLENOL PM) 25-500 MG TABS tablet Take 1 tablet by mouth at bedtime.   Yes [provider]  dutasteride (AVODART) 0.5 MG capsule Take 0.5 mg by mouth in the morning and at bedtime. 05/21/20  Yes [provider]  guaiFENesin (MUCINEX) 600 MG 12 hr tablet Take 1 tablet (600 mg total) by mouth 2 (two) times daily as needed for cough or to loosen phlegm. 11/05/20  Yes Strader, [place] M, PA-C  KLOR-CON M10 10 MEQ tablet Take 10 mEq by mouth daily. 07/16/22  Yes [provider]  metoprolol succinate (TOPROL-XL) 50 MG 24 hr tablet Take 50 mg by mouth daily. 04/23/20  Yes [provider]  montelukast (SINGULAIR) 10 MG tablet Take 10 mg by mouth at bedtime. 04/19/20  Yes [provider]  polyethylene glycol (MIRALAX / GLYCOLAX) 17 g packet Take 17 g by mouth daily as needed for mild constipation. 07/19/20  Yes Bonnielee Haff, MD  polyvinyl alcohol (LIQUIFILM TEARS) 1.4 % ophthalmic solution Place 1 drop into both eyes as needed for dry eyes.   Yes [provider]  senna-docusate (SENOKOT-S) 8.6-50 MG tablet Take 2 tablets by mouth daily.   Yes [provider]  sodium chloride (OCEAN) 0.65 % SOLN nasal spray Place 1 spray into both nostrils as needed for congestion. 08/06/22  Yes Eulogio Bear U, DO  tamsulosin (FLOMAX) 0.4 MG CAPS capsule Take 0.8 mg by mouth at bedtime. 05/21/20  Yes [provider]  torsemide (DEMADEX) 20 MG tablet Take 40 mg by mouth daily.   Yes [provider]  amoxicillin (AMOXIL) 500 MG tablet Take 4 tablets (2,000 mg) one hour prior to all dental visits. Patient taking differently: Take 2,000 mg by mouth See admin instructions. Take 4 tablets (2,000 mg) one hour prior to all dental  visits. 11/10/20   Eileen Stanford, PA-C    Physical Exam: Vitals:   09/17/22 1230 09/17/22 1500 09/17/22 1512 09/17/22 1755  BP: 114/65 115/76    Pulse: 80 71    Resp: (!) 23 (!) 22    Temp:   98 F (36.7 C)   TempSrc:   Oral   SpO2: 99% 94%    Weight:    87.7 kg  Height:    5' 8"$  (1.727 m)    Constitutional: NAD, calm, comfortable Vitals:   09/17/22 1230 09/17/22 1500 09/17/22 1512 09/17/22 1755  BP: 114/65 115/76    Pulse: 80 71    Resp: (!) 23 (!) 22    Temp:   98 F (36.7 C)   TempSrc:   Oral   SpO2: 99% 94%    Weight:    87.7 kg  Height:    5' 8"$  (1.727 m)   Eyes: PERRL, lids and conjunctivae normal ENMT: Mucous membranes are moist. Posterior pharynx clear of any exudate or lesions.Normal dentition.  Neck: normal, supple, no masses, no thyromegaly Respiratory: clear to auscultation  bilaterally, no wheezing, fine crackles on bilateral lower fields, increasing breathing effort. No accessory muscle use.  Cardiovascular: Irregular heart rate, in and out of rapid A-fib, no murmurs / rubs / gallops. 2+ extremity edema. 2+ pedal pulses. No carotid bruits.  Abdomen: no tenderness, no masses palpated. No hepatosplenomegaly. Bowel sounds positive.  Musculoskeletal: no clubbing / cyanosis. No joint deformity upper and lower extremities. Good ROM, no contractures. Normal muscle tone.  Skin: no rashes, lesions, ulcers. No induration Neurologic: CN 2-12 grossly intact. Sensation intact, DTR normal. Strength 5/5 in all 4.  Psychiatric: Normal judgment and insight. Alert and oriented x 3. Normal mood.     Labs on Admission: I have personally reviewed following labs and imaging studies  CBC: Recent Labs  Lab 09/17/22 1225  WBC 6.9  NEUTROABS 5.4  HGB 13.0  HCT 40.9  MCV 103.0*  PLT [id number]   Basic Metabolic Panel: Recent Labs  Lab 09/17/22 1225  NA 135  K 4.0  CL 94*  CO2 32  GLUCOSE 92  BUN 17  CREATININE 1.00  CALCIUM 8.9   GFR: Estimated Creatinine  Clearance: 49.7 mL/min (by C-G formula based on SCr of 1 mg/dL). Liver Function Tests: Recent Labs  Lab 09/17/22 1225  AST 20  ALT 12  ALKPHOS 85  BILITOT 1.2  PROT 7.1  ALBUMIN 3.7   No results for input(s): "LIPASE", "AMYLASE" in the last 168 hours. No results for input(s): "AMMONIA" in the last 168 hours. Coagulation Profile: No results for input(s): "INR", "PROTIME" in the last 168 hours. Cardiac Enzymes: No results for input(s): "CKTOTAL", "CKMB", "CKMBINDEX", "TROPONINI" in the last 168 hours. BNP (last 3 results) No results for input(s): "PROBNP" in the last 8760 hours. HbA1C: No results for input(s): "HGBA1C" in the last 72 hours. CBG: No results for input(s): "GLUCAP" in the last 168 hours. Lipid Profile: No results for input(s): "CHOL", "HDL", "LDLCALC", "TRIG", "CHOLHDL", "LDLDIRECT" in the last 72 hours. Thyroid Function Tests: No results for input(s): "TSH", "T4TOTAL", "FREET4", "T3FREE", "THYROIDAB" in the last 72 hours. Anemia Panel: No results for input(s): "VITAMINB12", "FOLATE", "FERRITIN", "TIBC", "IRON", "RETICCTPCT" in the last 72 hours. Urine analysis:    Component Value Date/Time   COLORURINE STRAW (A) 10/31/2020 1931   APPEARANCEUR HAZY (A) 10/31/2020 1931   LABSPEC 1.008 10/31/2020 1931   PHURINE 7.0 10/31/2020 1931   GLUCOSEU NEGATIVE 10/31/2020 1931   HGBUR NEGATIVE 10/31/2020 1931   BILIRUBINUR NEGATIVE 10/31/2020 1931   KETONESUR NEGATIVE 10/31/2020 1931   PROTEINUR NEGATIVE 10/31/2020 1931   NITRITE NEGATIVE 10/31/2020 1931   LEUKOCYTESUR NEGATIVE 10/31/2020 1931    Radiological Exams on Admission: CT Angio Chest PE W and/or Wo Contrast  Result Date: 09/17/2022 CLINICAL DATA:  Shortness of breath. EXAM: CT ANGIOGRAPHY CHEST WITH CONTRAST TECHNIQUE: Multidetector CT imaging of the chest was performed using the standard protocol during bolus administration of intravenous contrast. Multiplanar CT image reconstructions and MIPs were obtained  to evaluate the vascular anatomy. RADIATION DOSE REDUCTION: This exam was performed according to the departmental dose-optimization program which includes automated exposure control, adjustment of the mA and/or kV according to patient size and/or use of iterative reconstruction technique. CONTRAST:  68m OMNIPAQUE IOHEXOL 350 MG/ML SOLN COMPARISON:  August 30, 2020. FINDINGS: Cardiovascular: Satisfactory opacification of the pulmonary arteries to the segmental level. No evidence of pulmonary embolism. Mild cardiomegaly is noted. Status post transcatheter aortic valve repair. 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 No pericardial effusion. Mediastinum/Nodes: No enlarged  mediastinal, hilar, or axillary lymph nodes. Thyroid gland, trachea, and esophagus demonstrate no significant findings. Lungs/Pleura: No pneumothorax is noted. Small right pleural effusion is noted. Minimal bibasilar subsegmental atelectasis is noted. Upper Abdomen: No acute abnormality. Musculoskeletal: No chest wall abnormality. No acute or significant osseous findings. Review of the MIP images confirms the above findings. IMPRESSION: No definite evidence of pulmonary embolus. 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 Small right pleural effusion. Minimal bibasilar subsegmental atelectasis. Aortic Atherosclerosis (ICD10-I70.0). Electronically Signed   By: Marijo Conception M.D.   On: 09/17/2022 14:53   DG Chest Portable 1 View  Result Date: 09/17/2022 CLINICAL DATA:  Dyspnea. EXAM: PORTABLE CHEST 1 VIEW COMPARISON:  August 04, 2022. FINDINGS: Stable cardiomegaly. Minimal right basilar opacity is noted concerning for atelectasis or infiltrate. Left lung is unremarkable. The visualized skeletal structures are unremarkable. IMPRESSION: Minimal right basilar subsegmental atelectasis or infiltrate is noted. Followup PA and lateral chest X-ray is recommended in 3-4  weeks following trial of antibiotic therapy to ensure resolution and exclude underlying malignancy. Aortic Atherosclerosis (ICD10-I70.0). Electronically Signed   By: Marijo Conception M.D.   On: 09/17/2022 12:25    EKG: Independently reviewed.  A-fib, no acute ST changes.  Assessment/Plan Principal Problem:   CHF (congestive heart failure) (HCC) Active Problems:   Acute respiratory failure with hypoxia (HCC)   COPD (chronic obstructive pulmonary disease) (HCC)   Atrial fibrillation, chronic (HCC)   Heart failure with preserved ejection fraction (HCC)   S/P TAVR (transcatheter aortic valve replacement)  (please populate well all problems here in Problem List. (For example, if patient is on BP meds at home and you resume or decide to hold them, it is a problem that needs to be her. Same for CAD, COPD, HLD and so on)  Acute on chronic HFpEF decompensation -Overload bilateral pleural effusion and increasing peripheral edema -Continue IV diuresis twice daily Lasix -Wean down oxygen -Most recent echo about 1 year ago, showing some evidence of " flattening of during systole and diastole consistent with increased right ventricular pressure and volume overload" however no specified right ventricle pressure or pulmonary artery pressure included in the report.  Will repeat echo -Discussed with patient and son at bedside importance of frequent weighing at home, and additional diuresis pills indication, both of them expressed understanding and agreed  Chronic A-fib on Eliquis -Most occasions on ED telemetry, heart rate 80s-upper 90s, unclear whether patient also had uncontrolled A-fib at home, as patient is in active CHF decompensation, 1 dose of IV digoxin given -Continue home dose of metoprolol  COPD -No symptoms or signs of acute exacerbation, continue as needed DuoNeb  History of TAVR -Stable  Chronic microcytic anemia, BPH -Stable, continue Flomax and Avodart  DVT prophylaxis: Eliquis Code  Status: DNR Family Communication: Son at bedside Disposition Plan: Expect less than 2 midnight hospital stay Consults called: None Admission status: Telemetry observation   Lequita Halt MD [facility] Pager [phone]  09/17/2022, 5:59 PM

## 2022-09-17 NOTE — ED Triage Notes (Signed)
Pt bib ems from [facility]; c/o worsening sob since last Thursday, recent admit for copd exacerbation around new years; lung sounds clear and equal with ems; 89% 2L; placed on, 4L 96%, ; increased swelling LE , hx CHF; pitting edema LLE;  denies dizziness, no CP; 12 lead afib w/ R BBB; 146/82, HR 76 irreg, RR 22

## 2022-09-17 NOTE — ED Provider Notes (Signed)
[facility] Provider Note  CSN: [id number] Arrival date & time: 09/17/22 1141  Chief Complaint(s) Shortness of Breath  HPI Ruben [name] is a 89 y.o. male with PMH CHF, COPD, A-fib on Coumadin, aortic stenosis status post TAVR who presents emergency department for evaluation of shortness of breath.  Patient arrived from his retirement facility with worsening shortness of breath for the last 4 days.  Found to be hypoxic to 89% on his home 2 L.  Endorses worsening lower extremity swelling and shortness of breath today but denies chest pain, abdominal pain, nausea, vomiting or other systemic symptoms.   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 Authorizing Provider  acetaminophen (TYLENOL) 500 MG tablet Take 1,000 mg by mouth in the morning, at noon, and at  bedtime.    [provider]  albuterol (VENTOLIN HFA) 108 (90 Base) MCG/ACT inhaler Inhale 2 puffs into the lungs every 6 (six) hours as needed for wheezing or shortness of breath. 08/06/22   Geradine Girt, DO  amoxicillin (AMOXIL) 500 MG tablet Take 4 tablets (2,000 mg) one hour prior to all dental visits. 11/10/20   Eileen Stanford, PA-C  apixaban (ELIQUIS) 5 MG TABS tablet Take 1 tablet (5 mg total) by mouth 2 (two) times daily. 10/23/21   O'Neal, Cassie Freer, MD  benzonatate (TESSALON) 200 MG capsule Take 1 capsule (200 mg total) by mouth 2 (two) times daily as needed for cough. 08/06/22   Geradine Girt, DO  diclofenac Sodium (VOLTAREN) 1 % GEL Apply 1 application topically 3 (three) times daily as needed (to both knees for arthritis pain).    [provider]  dutasteride (AVODART) 0.5 MG capsule Take 0.5 mg by mouth in the morning and at bedtime. 05/21/20   [provider]  guaiFENesin (MUCINEX) 600 MG 12 hr tablet Take 1 tablet (600 mg total) by mouth 2 (two) times daily as needed for cough or to loosen phlegm. 11/05/20   Strader, Fransisco Hertz, PA-C  KLOR-CON M10 10 MEQ tablet Take 10 mEq by mouth daily. 07/16/22   [provider]  metoprolol succinate (TOPROL-XL) 50 MG 24 hr tablet Take 50 mg by mouth daily. 04/23/20   [provider]  montelukast (SINGULAIR) 10 MG tablet Take 10 mg by mouth at bedtime. 04/19/20   [provider]  polyethylene glycol (MIRALAX / GLYCOLAX) 17 g packet Take 17 g by mouth daily as needed for mild constipation. 07/19/20   Bonnielee Haff, MD  polyvinyl alcohol (LIQUIFILM TEARS) 1.4 % ophthalmic solution Place 1 drop into both eyes as needed for dry eyes.    [provider]  senna-docusate (SENOKOT-S) 8.6-50 MG tablet Take 2 tablets by mouth daily.    [provider]  sodium chloride (OCEAN) 0.65 % SOLN nasal spray Place 1 spray into both nostrils as needed for congestion. 08/06/22   Geradine Girt,  DO  tamsulosin (FLOMAX) 0.4 MG CAPS capsule Take 0.8 mg by mouth at bedtime. 05/21/20   [provider]  torsemide (DEMADEX) 20 MG tablet Take 40 mg by mouth daily.    [provider]                                                                                                                                    Past Surgical History Past Surgical History:  Procedure Laterality Date   APPENDECTOMY     BUBBLE STUDY  09/07/2020   Procedure: BUBBLE STUDY;  Surgeon: Geralynn Rile, MD;  Location: [place];  Service: Cardiovascular;;   CARDIAC CATHETERIZATION     CLAVICLE SURGERY     INTRAMEDULLARY (IM) NAIL INTERTROCHANTERIC Left 09/20/2020   Procedure: INTRAMEDULLARY (IM) NAIL INTERTROCHANTRIC;  Surgeon: Renette Butters, MD;  Location: [place];  Service: Orthopedics;  Laterality: Left;   RIGHT/LEFT HEART CATH AND CORONARY ANGIOGRAPHY N/A 08/24/2020   Procedure: RIGHT/LEFT HEART CATH AND CORONARY ANGIOGRAPHY;  Surgeon: Sherren Mocha, MD;  Location: [facility];  Service: Cardiovascular;  Laterality: N/A;   TEE WITHOUT CARDIOVERSION N/A 09/07/2020   Procedure: TRANSESOPHAGEAL ECHOCARDIOGRAM (TEE);  Surgeon: Geralynn Rile, MD;  Location: [place];  Service: Cardiovascular;  Laterality: N/A;   TOTAL KNEE ARTHROPLASTY Bilateral    TRANSCATHETER AORTIC VALVE REPLACEMENT, TRANSFEMORAL N/A 11/01/2020   Procedure: TRANSCATHETER AORTIC VALVE REPLACEMENT, TRANSFEMORAL;  Surgeon: Sherren Mocha, MD;  Location: [place] [facility];  Service: Open Heart Surgery;  Laterality: N/A;   Family History History reviewed. No pertinent family history.  Social History Social History   Tobacco Use   Smoking status: Former    Packs/day: 1.00    Years: 30.00    Total pack years: 30.00    Types: Cigarettes    Quit date: 1975    Years since quitting: 49.1   Smokeless tobacco: Never  Vaping Use   Vaping Use: Never used  Substance Use Topics   Alcohol use: Not  Currently   Drug use: Never   Allergies Patient has no known allergies.  Review of Systems Review of Systems  Respiratory:  Positive for shortness of breath.     Physical Exam Vital Signs  I have reviewed the triage vital signs BP 114/65   Pulse 80   Resp (!) 23   Ht  $5' 9"c$  (1.753 m)   Wt 84.8 kg   SpO2 99%   BMI 27.62 kg/m   Physical Exam Constitutional:      General: He is not in acute distress.    Appearance: Normal appearance.  HENT:     Head: Normocephalic and atraumatic.     Nose: No congestion or rhinorrhea.  Eyes:     General:        Right eye: No discharge.        Left eye: No discharge.     Extraocular Movements: Extraocular movements intact.     Pupils: Pupils are equal, round, and reactive to light.  Cardiovascular:     Rate and Rhythm: Normal rate and regular rhythm.     Heart sounds: No murmur heard. Pulmonary:     Effort: No respiratory distress.     Breath sounds: Rales present. No wheezing.  Abdominal:     General: There is no distension.     Tenderness: There is no abdominal tenderness.  Musculoskeletal:        General: Normal range of motion.     Cervical back: Normal range of motion.     Right lower leg: Edema present.     Left lower leg: Edema present.  Skin:    General: Skin is warm and dry.  Neurological:     General: No focal deficit present.     Mental Status: He is alert.     ED Results and Treatments Labs (all labs ordered are listed, but only abnormal results are displayed) Labs Reviewed  RESP PANEL BY RT-PCR (RSV, FLU A&B, COVID)  RVPGX2  COMPREHENSIVE METABOLIC PANEL  BRAIN NATRIURETIC PEPTIDE  CBC WITH DIFFERENTIAL/PLATELET  TROPONIN I (HIGH SENSITIVITY)                                                                                                                          Radiology DG Chest Portable 1 View  Result Date: 09/17/2022 CLINICAL DATA:  Dyspnea. EXAM: PORTABLE CHEST 1 VIEW COMPARISON:  August 04, 2022.  FINDINGS: Stable cardiomegaly. Minimal right basilar opacity is noted concerning for atelectasis or infiltrate. Left lung is unremarkable. The visualized skeletal structures are unremarkable. IMPRESSION: Minimal right basilar subsegmental atelectasis or infiltrate is noted. Followup PA and lateral chest X-ray is recommended in 3-4 weeks following trial of antibiotic therapy to ensure resolution and exclude underlying malignancy. Aortic Atherosclerosis (ICD10-I70.0). Electronically Signed   By: Marijo Conception M.D.   On: 09/17/2022 12:25    Pertinent labs & imaging results that were available during my care of the patient were reviewed by me and considered in my medical decision making (see MDM for details).  Medications Ordered in ED Medications - No data to display  Procedures .Critical Care  Performed by: Teressa Lower, MD Authorized by: Teressa Lower, MD   Critical care provider statement:    Critical care time (minutes):  30   Critical care was necessary to treat or prevent imminent or life-threatening deterioration of the following conditions:  Respiratory failure   Critical care was time spent personally by me on the following activities:  Development of treatment plan with patient or surrogate, discussions with consultants, evaluation of patient's response to treatment, examination of patient, ordering and review of laboratory studies, ordering and review of radiographic studies, ordering and performing treatments and interventions, pulse oximetry, re-evaluation of patient's condition and review of old charts   (including critical care time)  Medical Decision Making / ED Course   This patient presents to the ED for concern of shortness of breath, this involves an extensive number of treatment options, and is a complaint that carries with it a high risk of  complications and morbidity.  The differential diagnosis includes Pe, PTX, Pulmonary Edema, ARDS, COPD/Asthma, ACS, CHF exacerbation, Arrhythmia, Pericardial Effusion/Tamponade, Anemia, Sepsis, Acidosis/Hypercapnia, Anxiety, Viral URI  MDM: Patient seen emerged part for evaluation of shortness of breath.  Physical exam with tachypnea but is otherwise unremarkable.  No appreciable wheezing heard on exam.  Laboratory evaluation with an elevated BNP to 398.3 which is slightly up from baseline.  High-sensitivity troponin unremarkable.  COVID and flu, RSV negative.  Chest x-ray with a possible infiltrate.  Patient requiring increasing oxygen requirements and is currently up to 4 L to maintain oxygen saturations.  Patient briefly removed his oxygen and dropped to 57% on room air.  Follow-up CT PE obtained in setting of hypoxia unknown origin that shows a small pleural effusion but is otherwise negative for PE or acute disease in the chest.  Lasix initiated and patient require hospitalization for increased oxygen requirement and suspected fluid overload.   Additional history obtained:  -External records from outside source obtained and reviewed including: Chart review including previous notes, labs, imaging, consultation notes   Lab Tests: -I ordered, reviewed, and interpreted labs.   The pertinent results include:   Labs Reviewed  RESP PANEL BY RT-PCR (RSV, FLU A&B, COVID)  RVPGX2  COMPREHENSIVE METABOLIC PANEL  BRAIN NATRIURETIC PEPTIDE  CBC WITH DIFFERENTIAL/PLATELET  TROPONIN I (HIGH SENSITIVITY)      EKG   EKG Interpretation  Date/Time:  Monday September 17 2022 11:53:35 EST Ventricular Rate:  72 PR Interval:    QRS Duration: 139 QT Interval:  425 QTC Calculation: 407 R Axis:   111 Text Interpretation: Atrial fibrillation Right bundle branch block Confirmed by [place] ([id number]) on 09/17/2022 3:19:59 PM         Imaging Studies ordered: I ordered imaging studies including chest  x-ray, CT PE I independently visualized and interpreted imaging. I agree with the radiologist interpretation   Medicines ordered and prescription drug management: No orders of the defined types were placed in this encounter.   -I have reviewed the patients home medicines and have made adjustments as needed  Critical interventions Oxygen supplementation, Lasix    Cardiac Monitoring: The patient was maintained on a cardiac monitor.  I personally viewed and interpreted the cardiac monitored which showed an underlying rhythm of: A-fib with right bundle  Social Determinants of Health:  Factors impacting patients care include: Currently in a assisted living facility taking care of his wife who is poststroke   Reevaluation: After the interventions noted above, I reevaluated the patient and found that they  have :improved  Co morbidities that complicate the patient evaluation  Past Medical History:  Diagnosis Date   Arthritis    CHF (congestive heart failure) (HCC)    COPD (chronic obstructive pulmonary disease) (HCC)    Mitral regurgitation    Persistent atrial fibrillation (HCC)    on coumadin    RBBB    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    Severe aortic stenosis       Dispostion: I considered admission for this patient, and due to shortness of breath with increasing oxygen requirements, patient require hospitalization     Final Clinical Impression(s) / ED Diagnoses Final diagnoses:  None     @PCDICTATION$ @    Teressa Lower, MD 09/17/22 1520

## 2022-09-17 NOTE — Progress Notes (Addendum)
Patient admitted from [facility]. Patient is alert and oriented. No pain assessed. Skin assessment completed. Telemetry placed and vitals/weight completed.

## 2022-09-17 NOTE — ED Notes (Signed)
Patient transported to CT 

## 2022-09-18 ENCOUNTER — Observation Stay (HOSPITAL_BASED_OUTPATIENT_CLINIC_OR_DEPARTMENT_OTHER): Payer: Medicare Other

## 2022-09-18 DIAGNOSIS — J9601 Acute respiratory failure with hypoxia: Secondary | ICD-10-CM

## 2022-09-18 DIAGNOSIS — I5031 Acute diastolic (congestive) heart failure: Secondary | ICD-10-CM

## 2022-09-18 DIAGNOSIS — I5033 Acute on chronic diastolic (congestive) heart failure: Secondary | ICD-10-CM | POA: Diagnosis not present

## 2022-09-18 DIAGNOSIS — J431 Panlobular emphysema: Secondary | ICD-10-CM

## 2022-09-18 LAB — BASIC METABOLIC PANEL
Anion gap: 9 (ref 5–15)
BUN: 19 mg/dL (ref 8–23)
CO2: 33 mmol/L — ABNORMAL HIGH (ref 22–32)
Calcium: 8.6 mg/dL — ABNORMAL LOW (ref 8.9–10.3)
Chloride: 96 mmol/L — ABNORMAL LOW (ref 98–111)
Creatinine, Ser: 1.01 mg/dL (ref 0.61–1.24)
GFR, Estimated: >60 mL/min (ref >60)
Glucose, Bld: 78 mg/dL (ref 70–99)
Potassium: 4 mmol/L (ref 3.5–5.1)
Sodium: 138 mmol/L (ref 135–145)

## 2022-09-18 LAB — ECHOCARDIOGRAM COMPLETE
AV Mean grad: 10 mmHg
AV Peak grad: 13 mmHg
Ao pk vel: 1.8 m/s
Height: 68 in
Weight: 3065.28 oz

## 2022-09-18 MED ORDER — PERFLUTREN LIPID MICROSPHERE
1.0000 mL | INTRAVENOUS | Status: DC | PRN
  Administered 2022-09-18: 4 mL via INTRAVENOUS

## 2022-09-18 NOTE — TOC Transition Note (Incomplete)
Transition of Care [facility]) - CM/SW Discharge Note   Patient Details  Name: Ruben [name] MRN: [id number] Date of Birth: 08-04-1927  Transition of Care [facility]) CM/SW Contact:  Ruben Pippin, LCSW Phone Number: 09/18/2022, 2:21 PM   Clinical Narrative:    Patient will DC to: [facility] Anticipated DC date: 09/18/2022 Family notified: Yes Transport by: [facility])   Per MD patient ready for DC to . RN, patient, patient's family, and facility notified of DC. Discharge Summary and FL2 sent to facility. RN to call report prior to discharge (). DC packet on chart. Ambulance transport requested for patient.   CSW will sign off for now as social work intervention is no longer needed. Please consult [place] again if new needs arise.   Final next level of care: Assisted Living Barriers to Discharge: No Barriers Identified   Patient Goals and CMS Choice      Discharge Placement                Patient chooses bed at: [facility] Patient to be transferred to facility by: [name] (son) Name of family member notified: Yes Patient and family notified of of transfer: 09/18/22  Discharge Plan and Services Additional resources added to the After Visit Summary for                                       Social Determinants of Health (SDOH) Interventions SDOH Screenings   Food Insecurity: No Food Insecurity (09/17/2022)  Housing: Low Risk  (09/17/2022)  Transportation Needs: No Transportation Needs (09/17/2022)  Utilities: Not At Risk (09/17/2022)  Tobacco Use: Medium Risk (09/17/2022)     Readmission Risk Interventions     No data to display

## 2022-09-18 NOTE — Evaluation (Signed)
Physical Therapy Evaluation Patient Details Name: Ruben [name] MRN: [id number] DOB: 07-09-1927 Today's Date: 09/18/2022  History of Present Illness  Patient is a 89 y/o male who presents on 2/12 with SOB, swelling in LEs and cough. Found to have acute on chronic CHF decompensation. CXR- bil pleural effusions. PMH includes CHF, AS s/p TAVR, A-fib, COPD.  Clinical Impression  Patient presents with dyspnea on exertion and decreased cardiorespiratory endurance s/p above. Pt is from [facility] and cares for his wife. Able to walk long distances to dining hall using rollator and make small meals for himself. No falls reports. Pt wearing 2L02 at baseline. Today, pt tolerated transfers and ambulation with supervision for safety. Sp02 decreased to 4L from 5L for ambulation and able to maintain Sp02 >95% with activity. Encouraged sitting in chair for all meals and walking to bathroom with staff as needed. Will follow acutely to maximize independence and mobility prior to return home as well as wean 02.     Recommendations for follow up therapy are one component of a multi-disciplinary discharge planning process, led by the attending physician.  Recommendations may be updated based on patient status, additional functional criteria and insurance authorization.  Follow Up Recommendations No PT follow up      Assistance Recommended at Discharge PRN  Patient can return home with the following  Assist for transportation    Equipment Recommendations    Recommendations for Other Services       Functional Status Assessment       Precautions / Restrictions Precautions Precautions: Other (comment) Precaution Comments: watch 02 Restrictions Weight Bearing Restrictions: No      Mobility  Bed Mobility Overal bed mobility: Modified Independent             General bed mobility comments: Use of rails, increased time.    Transfers Overall transfer level: Needs assistance Equipment used:  Rolling walker (2 wheels) Transfers: Sit to/from Stand Sit to Stand: Supervision           General transfer comment: Supervision for safety. Stood from [employer] x1    Ambulation/Gait Ambulation/Gait assistance: Scientist, forensic (Feet): 220 Feet Assistive device: Rolling walker (2 wheels) Gait Pattern/deviations: Step-through pattern, Decreased stride length, Trunk flexed Gait velocity: decreased Gait velocity interpretation: 1.31 - 2.62 ft/sec, indicative of limited community ambulator   General Gait Details: SLow, steady gait with 1 standing rest break, 2/4 DOE. SP02 remained >94% on 4L/min 02 [place].  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, walks to dining hall for dinner, makes small meals ADLs Comments: independent,     Hand  Dominance   Dominant Hand: Right    Extremity/Trunk Assessment   Upper Extremity Assessment Upper Extremity Assessment: Defer to OT evaluation    Lower Extremity Assessment Lower Extremity Assessment: Overall WFL for tasks assessed    Cervical / Trunk Assessment Cervical / Trunk Assessment: Kyphotic  Communication   Communication: HOH  Cognition Arousal/Alertness: Awake/alert Behavior During Therapy: WFL for tasks assessed/performed Overall Cognitive Status: Within Functional Limits for tasks assessed                                           General Comments General comments (skin integrity, edema, etc.): Sp02 decreased to 4L from 5L for ambulation and able to maintain Sp02 >95% with activity.    Exercises     Assessment/Plan    PT Assessment Patient needs continued PT services  PT Problem List Cardiopulmonary status limiting activity;Decreased activity tolerance       PT Treatment Interventions Gait training;Therapeutic exercise;Patient/family education;Functional mobility training;Therapeutic activities    PT Goals (Current goals can be found in the Care Plan section)  Acute Rehab PT Goals Patient Stated Goal: to be able tobreathe, go home to my wife PT Goal Formulation: With patient Time For Goal Achievement: 10/02/22 Potential to Achieve Goals: Good    Frequency Min 3X/week     Co-evaluation               AM-PAC PT "6 Clicks" Mobility  Outcome Measure Help needed turning from your back to your side while in a flat bed without using bedrails?: None Help needed moving from lying on your back to sitting on the side of a flat bed without using bedrails?: None Help needed moving to and from a bed to a chair (including a wheelchair)?: A Little Help needed standing up from a chair using your arms (e.g., wheelchair or bedside chair)?: A Little Help needed to walk in hospital room?: A Little Help needed climbing 3-5 steps with a railing? : A Little 6 Click Score: 20    End of Session Equipment Utilized During Treatment: Oxygen;Gait belt Activity Tolerance: Patient tolerated treatment well Patient left: in bed;with call bell/phone within reach Nurse Communication: Mobility status PT Visit Diagnosis: Difficulty in walking, not elsewhere classified (R26.2);Other (comment) (SOB)    Time: [id number] PT Time Calculation (min) (ACUTE ONLY): 25 min   Charges:   PT Evaluation $PT Eval Low Complexity: 1 Low PT Treatments $Gait Training: 8-22 mins        Zettie Cooley,  DPT Acute Rehabilitation Services Secure chat preferred Office [place] 09/18/2022, 9:42 AM

## 2022-09-18 NOTE — Progress Notes (Signed)
Echocardiogram 2D Echocardiogram has been performed.  Ronny Flurry 09/18/2022, 3:24 PM

## 2022-09-18 NOTE — Progress Notes (Signed)
Mobility Specialist - Progress Note   09/18/22 1354  Mobility  Activity Ambulated with assistance in hallway  Level of Assistance Standby assist, set-up cues, supervision of patient - no hands on  Assistive Device Front wheel walker  Distance Ambulated (ft) 600 ft  Activity Response Tolerated well  Mobility Referral Yes  $Mobility charge 1 Mobility    Pt received in recliner agreeable to mobility. No complaints throughout, took standing rest break x1. Left in recliner w/ call bell in reach.   [place] Specialist Please contact via SecureChat or Rehab office at [phone]

## 2022-09-18 NOTE — Discharge Summary (Addendum)
Physician Discharge Summary   Patient: Ruben [name] MRN: [id number] DOB: 1927-07-11  Admit date:     09/17/2022  Discharge date: 09/18/22  Discharge Physician: Oswald Hillock   PCP: Reymundo Poll, MD   Recommendations at discharge:   Follow-up cardiology outpatient Follow echo result as outpatient  Discharge Diagnoses: Principal Problem:   CHF (congestive heart failure) (HCC) Active Problems:   Acute respiratory failure with hypoxia (HCC)   COPD (chronic obstructive pulmonary disease) (HCC)   Atrial fibrillation, chronic (HCC)   Heart failure with preserved ejection fraction (HCC)   S/P TAVR (transcatheter aortic valve replacement)  Resolved Problems:   * No resolved hospital problems. *  Hospital Course:  89 y.o. male with medical history significant of chronic HFpEF, aortic stenosis status post TAVR, persistent A-fib, COPD, question of at least moderate pulmonary hypertension and cor pulmonale, presented with dry cough and increasing shortness of breath.   Started 4 days ago, with dry cough and increasing exertional dyspnea, also noticed increasing of bilateral lower extremity swelling, did not take extra torsemide.  Over the weekend symptoms got worse, last night could not sleep on a flat bed, denies any chest pain no palpitations no fever or chills.   ED Course: Vital signs stable, rate controlled A-fib, blood pressure within normal limits, O2 saturation 4 L 94%, x-ray showed mild bilateral small pleural effusion and CT chest showed pulmonary congestion and bilateral pleural effusion.  Assessment and Plan:  Acute on chronic HFpEF -Diuresed well with IV Lasix -Significantly improved -Oxygen weaned down to 2 L/min, and baseline -Echocardiogram from March 2023 showed EF 65%, LVH -IV Lasix has been discontinued, continue torsemide 40 mg p.o. daily  Chronic atrial fibrillation -Heart rate is controlled -Continue metoprolol -Continue Eliquis for anticoagulation  COPD -No  signs symptoms of acute exacerbation -Continue as needed DuoNebs  History of TAVR -Stable  Chronic microcytic anemia -Stable  History of BPH -Continue Pradaxa, Avodart      Consultants:  Procedures performed:   Disposition: Home Diet recommendation:  Discharge Diet Orders (From admission, onward)     Start     Ordered   09/18/22 0000  Diet - low sodium heart healthy        09/18/22 1359           Cardiac diet DISCHARGE MEDICATION: Allergies as of 09/18/2022   No Known Allergies      Medication List     TAKE these medications    acetaminophen 500 MG tablet Commonly known as: TYLENOL Take 500 mg by mouth daily.   albuterol 108 (90 Base) MCG/ACT inhaler Commonly known as: VENTOLIN HFA Inhale 2 puffs into the lungs every 6 (six) hours as needed for wheezing or shortness of breath.   amoxicillin 500 MG tablet Commonly known as: AMOXIL Take 4 tablets (2,000 mg) one hour prior to all dental visits. What changed:  how much to take how to take this when to take this   apixaban 5 MG Tabs tablet Commonly known as: Eliquis Take 1 tablet (5 mg total) by mouth 2 (two) times daily.   benzonatate 200 MG capsule Commonly known as: TESSALON Take 1 capsule (200 mg total) by mouth 2 (two) times daily as needed for cough.   diclofenac Sodium 1 % Gel Commonly known as: VOLTAREN Apply 1 application topically 3 (three) times daily as needed (to both knees for arthritis pain).   diphenhydramine-acetaminophen 25-500 MG Tabs tablet Commonly known as: TYLENOL PM Take 1 tablet by mouth  at bedtime.   dutasteride 0.5 MG capsule Commonly known as: AVODART Take 0.5 mg by mouth in the morning and at bedtime.   guaiFENesin 600 MG 12 hr tablet Commonly known as: MUCINEX Take 1 tablet (600 mg total) by mouth 2 (two) times daily as needed for cough or to loosen phlegm.   Klor-Con M10 10 MEQ tablet Generic drug: potassium chloride Take 10 mEq by mouth daily.   metoprolol  succinate 50 MG 24 hr tablet Commonly known as: TOPROL-XL Take 50 mg by mouth daily.   montelukast 10 MG tablet Commonly known as: SINGULAIR Take 10 mg by mouth at bedtime.   polyethylene glycol 17 g packet Commonly known as: MIRALAX / GLYCOLAX Take 17 g by mouth daily as needed for mild constipation.   polyvinyl alcohol 1.4 % ophthalmic solution Commonly known as: LIQUIFILM TEARS Place 1 drop into both eyes as needed for dry eyes.   senna-docusate 8.6-50 MG tablet Commonly known as: Senokot-S Take 2 tablets by mouth daily.   sodium chloride 0.65 % Soln nasal spray Commonly known as: OCEAN Place 1 spray into both nostrils as needed for congestion.   tamsulosin 0.4 MG Caps capsule Commonly known as: FLOMAX Take 0.8 mg by mouth at bedtime.   torsemide 20 MG tablet Commonly known as: DEMADEX Take 40 mg by mouth daily.        Discharge Exam: Filed Weights   09/17/22 1755 09/18/22 0540 09/18/22 1123  Weight: 87.7 kg 87.6 kg 86.9 kg   Appears in no acute distress S1-S2, regular Lungs clear to auscultation bilaterally  Condition at discharge: good  The results of significant diagnostics from this hospitalization (including imaging, microbiology, ancillary and laboratory) are listed below for reference.   Imaging Studies: CT Angio Chest PE W and/or Wo Contrast  Result Date: 09/17/2022 CLINICAL DATA:  Shortness of breath. EXAM: CT ANGIOGRAPHY CHEST WITH CONTRAST TECHNIQUE: Multidetector CT imaging of the chest was performed using the standard protocol during bolus administration of intravenous contrast. Multiplanar CT image reconstructions and MIPs were obtained to evaluate the vascular anatomy. RADIATION DOSE REDUCTION: This exam was performed according to the departmental dose-optimization program which includes automated exposure control, adjustment of the mA and/or kV according to patient size and/or use of iterative reconstruction technique. CONTRAST:  15m OMNIPAQUE  IOHEXOL 350 MG/ML SOLN COMPARISON:  August 30, 2020. FINDINGS: Cardiovascular: Satisfactory opacification of the pulmonary arteries to the segmental level. No evidence of pulmonary embolism. Mild cardiomegaly is noted. Status post transcatheter aortic valve repair. 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 No pericardial effusion. Mediastinum/Nodes: No enlarged mediastinal, hilar, or axillary lymph nodes. Thyroid gland, trachea, and esophagus demonstrate no significant findings. Lungs/Pleura: No pneumothorax is noted. Small right pleural effusion is noted. Minimal bibasilar subsegmental atelectasis is noted. Upper Abdomen: No acute abnormality. Musculoskeletal: No chest wall abnormality. No acute or significant osseous findings. Review of the MIP images confirms the above findings. IMPRESSION: No definite evidence of pulmonary embolus. 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 Small right pleural effusion. Minimal bibasilar subsegmental atelectasis. Aortic Atherosclerosis (ICD10-I70.0). Electronically Signed   By: JMarijo ConceptionM.D.   On: 09/17/2022 14:53   DG Chest Portable 1 View  Result Date: 09/17/2022 CLINICAL DATA:  Dyspnea. EXAM: PORTABLE CHEST 1 VIEW COMPARISON:  August 04, 2022. FINDINGS: Stable cardiomegaly. Minimal right basilar opacity is noted concerning for atelectasis or infiltrate. Left lung is unremarkable. The visualized skeletal structures are unremarkable. IMPRESSION: Minimal  right basilar subsegmental atelectasis or infiltrate is noted. Followup PA and lateral chest X-ray is recommended in 3-4 weeks following trial of antibiotic therapy to ensure resolution and exclude underlying malignancy. Aortic Atherosclerosis (ICD10-I70.0). Electronically Signed   By: Marijo Conception M.D.   On: 09/17/2022 12:25    Microbiology: Results for orders placed or performed during the hospital encounter of 09/17/22  Resp  panel by RT-PCR (RSV, Flu A&B, Covid) Anterior Nasal Swab     Status: None   Collection Time: 09/17/22 12:23 PM   Specimen: Anterior Nasal Swab  Result Value Ref Range Status   SARS Coronavirus 2 by RT PCR NEGATIVE NEGATIVE Final   Influenza A by PCR NEGATIVE NEGATIVE Final   Influenza B by PCR NEGATIVE NEGATIVE Final    Comment: (NOTE) The Xpert Xpress SARS-CoV-2/FLU/RSV plus assay is intended as an aid in the diagnosis of influenza from Nasopharyngeal swab specimens and should not be used as a sole basis for treatment. Nasal washings and aspirates are unacceptable for Xpert Xpress SARS-CoV-2/FLU/RSV testing.  Fact Sheet for Patients: EntrepreneurPulse.com.au  Fact Sheet for Healthcare Providers: IncredibleEmployment.be  This test is not yet approved or cleared by the [place] FDA and has been authorized for detection and/or diagnosis of SARS-CoV-2 by FDA under an Emergency Use Authorization (EUA). This EUA will remain in effect (meaning this test can be used) for the duration of the COVID-19 declaration under Section 564(b)(1) of the Act, 21 U.S.C. section 360bbb-3(b)(1), unless the authorization is terminated or revoked.     Resp Syncytial Virus by PCR NEGATIVE NEGATIVE Final    Comment: (NOTE) Fact Sheet for Patients: EntrepreneurPulse.com.au  Fact Sheet for Healthcare Providers: IncredibleEmployment.be  This test is not yet approved or cleared by the [place] FDA and has been authorized for detection and/or diagnosis of SARS-CoV-2 by FDA under an Emergency Use Authorization (EUA). This EUA will remain in effect (meaning this test can be used) for the duration of the COVID-19 declaration under Section 564(b)(1) of the Act, 21 U.S.C. section 360bbb-3(b)(1), unless the authorization is terminated or revoked.  Performed at [facility], [place] [address], [place], [place] [address]      Labs: CBC: Recent Labs  Lab 09/17/22 1225  WBC 6.9  NEUTROABS 5.4  HGB 13.0  HCT 40.9  MCV 103.0*  PLT [id number]   Basic Metabolic Panel: Recent Labs  Lab 09/17/22 1225 09/18/22 0023  NA 135 138  K 4.0 4.0  CL 94* 96*  CO2 32 33*  GLUCOSE 92 78  BUN 17 19  CREATININE 1.00 1.01  CALCIUM 8.9 8.6*   Liver Function Tests: Recent Labs  Lab 09/17/22 1225  AST 20  ALT 12  ALKPHOS 85  BILITOT 1.2  PROT 7.1  ALBUMIN 3.7   CBG: No results for input(s): "GLUCAP" in the last 168 hours.  Discharge time spent: greater than 30 minutes.  Signed: Oswald Hillock, MD [facility] 09/18/2022

## 2023-03-05 ENCOUNTER — Telehealth: Payer: Self-pay | Admitting: Podiatry

## 2023-03-05 NOTE — Telephone Encounter (Signed)
Pt ingrown is infected and requesting a antibiotic to hold him over until his appointment on 03/11/23.   Please advise

## 2023-03-06 ENCOUNTER — Other Ambulatory Visit: Payer: Self-pay | Admitting: Podiatry

## 2023-03-06 MED ORDER — DOXYCYCLINE HYCLATE 100 MG PO TABS: 100.0000 mg | ORAL_TABLET | Freq: Two times a day (BID) | ORAL | 1 refills | Status: AC

## 2023-03-06 NOTE — Telephone Encounter (Signed)
done

## 2023-03-11 ENCOUNTER — Ambulatory Visit (INDEPENDENT_AMBULATORY_CARE_PROVIDER_SITE_OTHER): Payer: Medicare Other | Admitting: Podiatry

## 2023-03-11 ENCOUNTER — Encounter: Payer: Self-pay | Admitting: Podiatry

## 2023-03-11 DIAGNOSIS — B351 Tinea unguium: Secondary | ICD-10-CM | POA: Diagnosis not present

## 2023-03-11 DIAGNOSIS — M79674 Pain in right toe(s): Secondary | ICD-10-CM

## 2023-03-11 DIAGNOSIS — M79675 Pain in left toe(s): Secondary | ICD-10-CM

## 2023-03-11 NOTE — Patient Instructions (Signed)

## 2023-03-12 NOTE — Progress Notes (Signed)
Subjective:   Patient ID: Ruben [name], male   DOB: 89 y.o.   MRN: [id number]   HPI Patient presents with elongated thickened nailbeds 1-5 both feet and some redness and drainage from the lateral left big toe the patient is now on doxycycline for and it is improving and feeling better.  Does have pain with these nailbeds   ROS      Objective:  Physical Exam  Neurovascular status mildly diminished but intact thick yellow brittle nailbeds 1-5 both feet dystrophic painful when pressed     Assessment:  Chronic mycotic nail infection with pain 1-5 both feet     Plan:  Reviewed condition I carefully took out the lateral border left hallux debridement wise debrided all nails 1-5 instructed on soaks finishing doxycycline if any issues were to occur reappoint I may need to anesthetize and completely remove the corner if it were to still be a problem
